# Patient Record
Sex: Male | Born: 1955 | Race: White | Hispanic: No | Smoking: Former smoker
Health system: Southern US, Community
[De-identification: ages and names within clinical notes are randomized; demographics above are authoritative.]

## PROBLEM LIST (undated history)

## (undated) DIAGNOSIS — E119 Type 2 diabetes mellitus without complications: Secondary | ICD-10-CM

## (undated) DIAGNOSIS — J189 Pneumonia, unspecified organism: Secondary | ICD-10-CM

## (undated) DIAGNOSIS — I10 Essential (primary) hypertension: Secondary | ICD-10-CM

## (undated) DIAGNOSIS — E7800 Pure hypercholesterolemia, unspecified: Secondary | ICD-10-CM

## (undated) DIAGNOSIS — G4733 Obstructive sleep apnea (adult) (pediatric): Secondary | ICD-10-CM

## (undated) DIAGNOSIS — K219 Gastro-esophageal reflux disease without esophagitis: Secondary | ICD-10-CM

## (undated) DIAGNOSIS — M359 Systemic involvement of connective tissue, unspecified: Secondary | ICD-10-CM

## (undated) DIAGNOSIS — I2510 Atherosclerotic heart disease of native coronary artery without angina pectoris: Secondary | ICD-10-CM

## (undated) HISTORY — PX: CORONARY ANGIOPLASTY WITH STENT PLACEMENT: SHX49

---

## 2009-05-25 DIAGNOSIS — J189 Pneumonia, unspecified organism: Secondary | ICD-10-CM

## 2009-05-25 HISTORY — DX: Pneumonia, unspecified organism: J18.9

## 2016-07-23 DIAGNOSIS — E119 Type 2 diabetes mellitus without complications: Secondary | ICD-10-CM

## 2016-07-23 HISTORY — DX: Type 2 diabetes mellitus without complications: E11.9

## 2016-08-23 ENCOUNTER — Encounter (HOSPITAL_COMMUNITY): Payer: Self-pay | Admitting: *Deleted

## 2016-08-23 ENCOUNTER — Inpatient Hospital Stay (HOSPITAL_COMMUNITY)
Admission: EM | Admit: 2016-08-23 | Discharge: 2016-08-26 | DRG: 167 | Disposition: A | Discharge status: Home / Self Care | Payer: 59 | Attending: Oncology | Admitting: Oncology

## 2016-08-23 ENCOUNTER — Emergency Department (HOSPITAL_COMMUNITY): Payer: 59

## 2016-08-23 DIAGNOSIS — E669 Obesity, unspecified: Secondary | ICD-10-CM

## 2016-08-23 DIAGNOSIS — R61 Generalized hyperhidrosis: Secondary | ICD-10-CM | POA: Diagnosis not present

## 2016-08-23 DIAGNOSIS — R634 Abnormal weight loss: Secondary | ICD-10-CM

## 2016-08-23 DIAGNOSIS — Z6834 Body mass index (BMI) 34.0-34.9, adult: Secondary | ICD-10-CM

## 2016-08-23 DIAGNOSIS — I10 Essential (primary) hypertension: Secondary | ICD-10-CM | POA: Diagnosis present

## 2016-08-23 DIAGNOSIS — Z803 Family history of malignant neoplasm of breast: Secondary | ICD-10-CM

## 2016-08-23 DIAGNOSIS — Z87891 Personal history of nicotine dependence: Secondary | ICD-10-CM

## 2016-08-23 DIAGNOSIS — I2510 Atherosclerotic heart disease of native coronary artery without angina pectoris: Secondary | ICD-10-CM | POA: Diagnosis not present

## 2016-08-23 DIAGNOSIS — E1165 Type 2 diabetes mellitus with hyperglycemia: Secondary | ICD-10-CM | POA: Diagnosis present

## 2016-08-23 DIAGNOSIS — Z79899 Other long term (current) drug therapy: Secondary | ICD-10-CM

## 2016-08-23 DIAGNOSIS — Z955 Presence of coronary angioplasty implant and graft: Secondary | ICD-10-CM

## 2016-08-23 DIAGNOSIS — R918 Other nonspecific abnormal finding of lung field: Secondary | ICD-10-CM | POA: Diagnosis not present

## 2016-08-23 DIAGNOSIS — Z578 Occupational exposure to other risk factors: Secondary | ICD-10-CM

## 2016-08-23 DIAGNOSIS — E871 Hypo-osmolality and hyponatremia: Secondary | ICD-10-CM | POA: Diagnosis present

## 2016-08-23 DIAGNOSIS — Z201 Contact with and (suspected) exposure to tuberculosis: Secondary | ICD-10-CM | POA: Diagnosis present

## 2016-08-23 DIAGNOSIS — E785 Hyperlipidemia, unspecified: Secondary | ICD-10-CM | POA: Diagnosis present

## 2016-08-23 DIAGNOSIS — Z7982 Long term (current) use of aspirin: Secondary | ICD-10-CM

## 2016-08-23 DIAGNOSIS — R042 Hemoptysis: Secondary | ICD-10-CM | POA: Diagnosis not present

## 2016-08-23 DIAGNOSIS — Z7984 Long term (current) use of oral hypoglycemic drugs: Secondary | ICD-10-CM

## 2016-08-23 DIAGNOSIS — G4733 Obstructive sleep apnea (adult) (pediatric): Secondary | ICD-10-CM

## 2016-08-23 DIAGNOSIS — Z7902 Long term (current) use of antithrombotics/antiplatelets: Secondary | ICD-10-CM

## 2016-08-23 DIAGNOSIS — J40 Bronchitis, not specified as acute or chronic: Secondary | ICD-10-CM | POA: Diagnosis present

## 2016-08-23 HISTORY — DX: Essential (primary) hypertension: I10

## 2016-08-23 HISTORY — DX: Systemic involvement of connective tissue, unspecified: M35.9

## 2016-08-23 HISTORY — DX: Atherosclerotic heart disease of native coronary artery without angina pectoris: I25.10

## 2016-08-23 LAB — CBC WITH DIFFERENTIAL/PLATELET
Basophils Absolute: 0 10*3/uL (ref 0.0–0.1)
Basophils Relative: 1 %
EOS ABS: 0.9 10*3/uL — AB (ref 0.0–0.7)
Eosinophils Relative: 10 %
HEMATOCRIT: 38.8 % — AB (ref 39.0–52.0)
HEMOGLOBIN: 13.2 g/dL (ref 13.0–17.0)
LYMPHS ABS: 1.8 10*3/uL (ref 0.7–4.0)
Lymphocytes Relative: 21 %
MCH: 28.5 pg (ref 26.0–34.0)
MCHC: 34 g/dL (ref 30.0–36.0)
MCV: 83.8 fL (ref 78.0–100.0)
MONOS PCT: 5 %
Monocytes Absolute: 0.4 10*3/uL (ref 0.1–1.0)
NEUTROS ABS: 5.6 10*3/uL (ref 1.7–7.7)
NEUTROS PCT: 63 %
Platelets: 281 10*3/uL (ref 150–400)
RBC: 4.63 MIL/uL (ref 4.22–5.81)
RDW: 14.6 % (ref 11.5–15.5)
WBC: 8.8 10*3/uL (ref 4.0–10.5)

## 2016-08-23 LAB — BASIC METABOLIC PANEL
Anion gap: 7 (ref 5–15)
BUN: 9 mg/dL (ref 6–20)
CHLORIDE: 100 mmol/L — AB (ref 101–111)
CO2: 26 mmol/L (ref 22–32)
Calcium: 9.7 mg/dL (ref 8.9–10.3)
Creatinine, Ser: 0.57 mg/dL — ABNORMAL LOW (ref 0.61–1.24)
GFR calc non Af Amer: >60 mL/min (ref >60)
Glucose, Bld: 261 mg/dL — ABNORMAL HIGH (ref 65–99)
POTASSIUM: 4.1 mmol/L (ref 3.5–5.1)
SODIUM: 133 mmol/L — AB (ref 135–145)

## 2016-08-23 LAB — TYPE AND SCREEN
ABO/RH(D): B POS
Antibody Screen: NEGATIVE

## 2016-08-23 LAB — ABO/RH: ABO/RH(D): B POS

## 2016-08-23 MED ORDER — IOPAMIDOL (ISOVUE-370) INJECTION 76%
80.0000 mL | Freq: Once | INTRAVENOUS | Status: AC | PRN
  Administered 2016-08-23: 80 mL via INTRAVENOUS

## 2016-08-23 MED ORDER — DEXTROSE 5 % IV SOLN: 1.0000 g | INTRAVENOUS | Status: DC

## 2016-08-23 MED ORDER — ACETAMINOPHEN 325 MG PO TABS: 650.0000 mg | ORAL_TABLET | Freq: Four times a day (QID) | ORAL | Status: DC | PRN

## 2016-08-23 MED ORDER — INSULIN ASPART 100 UNIT/ML ~~LOC~~ SOLN
0.0000 [IU] | Freq: Every day | SUBCUTANEOUS | Status: DC
  Administered 2016-08-25: 2 [IU] via SUBCUTANEOUS

## 2016-08-23 MED ORDER — DEXTROSE 5 % IV SOLN
1.0000 g | Freq: Once | INTRAVENOUS | Status: AC
  Administered 2016-08-23: 1 g via INTRAVENOUS
  Filled 2016-08-23: qty 10

## 2016-08-23 MED ORDER — LISINOPRIL 10 MG PO TABS
10.0000 mg | ORAL_TABLET | Freq: Every day | ORAL | Status: DC
  Administered 2016-08-24 – 2016-08-25 (×2): 10 mg via ORAL
  Filled 2016-08-23 (×3): qty 1

## 2016-08-23 MED ORDER — ACETAMINOPHEN 650 MG RE SUPP: 650.0000 mg | Freq: Four times a day (QID) | RECTAL | Status: DC | PRN

## 2016-08-23 MED ORDER — AZITHROMYCIN 250 MG PO TABS
250.0000 mg | ORAL_TABLET | Freq: Every day | ORAL | Status: DC
  Administered 2016-08-24: 250 mg via ORAL
  Filled 2016-08-23: qty 1

## 2016-08-23 MED ORDER — INSULIN ASPART 100 UNIT/ML ~~LOC~~ SOLN
0.0000 [IU] | Freq: Three times a day (TID) | SUBCUTANEOUS | Status: DC
  Administered 2016-08-24: 5 [IU] via SUBCUTANEOUS
  Administered 2016-08-24: 3 [IU] via SUBCUTANEOUS
  Administered 2016-08-24: 8 [IU] via SUBCUTANEOUS
  Administered 2016-08-25: 5 [IU] via SUBCUTANEOUS
  Administered 2016-08-25: 3 [IU] via SUBCUTANEOUS
  Administered 2016-08-25 – 2016-08-26 (×3): 5 [IU] via SUBCUTANEOUS

## 2016-08-23 MED ORDER — SENNOSIDES-DOCUSATE SODIUM 8.6-50 MG PO TABS
1.0000 | ORAL_TABLET | Freq: Every evening | ORAL | Status: DC | PRN
  Administered 2016-08-26: 1 via ORAL
  Filled 2016-08-23 (×2): qty 1

## 2016-08-23 MED ORDER — PRASUGREL HCL 10 MG PO TABS
10.0000 mg | ORAL_TABLET | Freq: Every day | ORAL | Status: DC
  Administered 2016-08-24: 10 mg via ORAL
  Filled 2016-08-23 (×2): qty 1

## 2016-08-23 MED ORDER — ROSUVASTATIN CALCIUM 10 MG PO TABS
10.0000 mg | ORAL_TABLET | Freq: Every day | ORAL | Status: DC
  Administered 2016-08-24 – 2016-08-25 (×2): 10 mg via ORAL
  Filled 2016-08-23 (×4): qty 1

## 2016-08-23 MED ORDER — AZITHROMYCIN 250 MG PO TABS
500.0000 mg | ORAL_TABLET | Freq: Once | ORAL | Status: AC
Start: 2016-08-23 — End: 2016-08-23
  Administered 2016-08-23: 500 mg via ORAL
  Filled 2016-08-23: qty 2

## 2016-08-23 MED ORDER — ASPIRIN 325 MG PO TABS
325.0000 mg | ORAL_TABLET | Freq: Every day | ORAL | Status: DC
  Filled 2016-08-23 (×2): qty 1

## 2016-08-23 MED ORDER — CARVEDILOL 12.5 MG PO TABS
12.5000 mg | ORAL_TABLET | Freq: Every day | ORAL | Status: DC
  Administered 2016-08-24 – 2016-08-25 (×2): 12.5 mg via ORAL
  Filled 2016-08-23 (×3): qty 1

## 2016-08-23 NOTE — Progress Notes (Signed)
Patient arrived to floor from ED at 2054. Arrived with mask on. Quantiferon tb gold blood test ordered. Paged MD about need to transfer patient to negative pressure/airborne precautions. MD agreed and will place order.

## 2016-08-23 NOTE — ED Provider Notes (Signed)
[facility] DEPT Provider Note   CSN: [id number] Arrival date & time: 08/23/16  1056     History   Chief Complaint Chief Complaint  Patient presents with  . Hemoptysis   HPI [name] [name] is a 61 y.o. male.  Patient is a 61 year old gentleman with past mental history of type 2 diabetes and coronary artery disease status post PCI 7 years, and recent angioplasty one week ago. Patient lives in [place] and has a cardiologist which he sees for coronary artery disease but has no PCP. He presents today for complaint of hemoptysis with bright red blood that occurs on coughing. Symptoms started yesterday in the setting of a three-week history of chronic cough and sinus congestion. Patient is taking Robitussin for the symptoms at home with some mild relief. Patient has an extensive smoking history but quit 4 years ago. Patient has no history of blood clots or family history of same. Patient denies any chest pain or shortness of breath. Denies any dizziness, lightheadedness, fevers, chills, abdominal pain.      Past Medical History:  Diagnosis Date  . Coronary artery disease   . Diabetes mellitus without complication ([place])   . Hypertension     There are no active problems to display for this patient.   Past Surgical History:  Procedure Laterality Date  . CARDIAC CATHETERIZATION  2011   s/p PCI  . CORONARY ANGIOPLASTY  2018       Home Medications    Prior to Admission medications   Medication Sig Start Date End Date Taking? Authorizing Provider  aspirin 325 MG tablet Take 325 mg by mouth daily.   Yes Historical Provider, MD  aspirin-sod bicarb-citric acid (ALKA-SELTZER) 325 MG TBEF tablet Take 325 mg by mouth every 6 (six) hours as needed (cold symptoms).   Yes Historical Provider, MD  carvedilol (COREG) 12.5 MG tablet Take 12.5 mg by mouth daily.   Yes Historical Provider, MD  dextromethorphan (ROBITUSSIN 12 HOUR COUGH) 30 MG/5ML liquid Take 10 mLs by mouth at bedtime as  needed for cough.   Yes Historical Provider, MD  lisinopril (PRINIVIL,ZESTRIL) 10 MG tablet Take 10 mg by mouth daily.   Yes Historical Provider, MD  metFORMIN (GLUCOPHAGE) 500 MG tablet Take 500 mg by mouth 2 (two) times daily with a meal.   Yes Historical Provider, MD  Oral Electrolytes (EMERGEN-C ELECTRO MIX) PACK Take 1 packet by mouth daily as needed (supplement).   Yes Historical Provider, MD  phenol (CHLORASEPTIC) 1.4 % LIQD Use as directed 1 spray in the mouth or throat as needed for throat irritation / pain.   Yes Historical Provider, MD  prasugrel (EFFIENT) 10 MG TABS tablet Take 10 mg by mouth daily.   Yes Historical Provider, MD  rosuvastatin (CRESTOR) 10 MG tablet Take 10 mg by mouth at bedtime.   Yes Historical Provider, MD    Family History History reviewed. No pertinent family history.  Social History Social History  Substance Use Topics  . Smoking status: Former Smoker    Types: Cigarettes    Quit date: 2014  . Smokeless tobacco: Never Used  . Alcohol use Not on file     Allergies   Patient has no known allergies.   Review of Systems Review of Systems  Constitutional: Negative for chills and fever.  HENT: Positive for rhinorrhea, sinus pressure and sore throat. Negative for ear pain.   Eyes: Negative for pain and visual disturbance.  Respiratory: Positive for cough. Negative for chest tightness and shortness  of breath.   Cardiovascular: Negative for chest pain, palpitations and leg swelling.  Gastrointestinal: Negative for abdominal pain and vomiting.  Genitourinary: Negative for dysuria and hematuria.  Musculoskeletal: Negative for arthralgias and back pain.  Skin: Negative for color change and rash.  Neurological: Negative for dizziness and syncope.  All other systems reviewed and are negative.    Physical Exam Updated Vital Signs BP 124/73   Pulse 76   Temp 98.7 F (37.1 C) (Oral)   Resp 18   SpO2 97%   Physical Exam  Constitutional: He appears  well-developed and well-nourished.  HENT:  Head: Normocephalic and atraumatic.  Eyes: Conjunctivae are normal.  Neck: Neck supple.  Cardiovascular: Normal rate and regular rhythm.   No murmur heard. Pulmonary/Chest: Effort normal and breath sounds normal. No respiratory distress. He has no wheezes. He has no rales.  Abdominal: Soft. There is no tenderness.  Musculoskeletal: He exhibits no edema.  Neurological: He is alert.  Skin: Skin is warm and dry.  Psychiatric: He has a normal mood and affect.  Nursing note and vitals reviewed.    ED Treatments / Results  Labs (all labs ordered are listed, but only abnormal results are displayed) Labs Reviewed  CBC WITH DIFFERENTIAL/PLATELET - Abnormal; Notable for the following:       Result Value   HCT 38.8 (*)    Eosinophils Absolute 0.9 (*)    All other components within normal limits  BASIC METABOLIC PANEL - Abnormal; Notable for the following:    Sodium 133 (*)    Chloride 100 (*)    Glucose, Bld 261 (*)    Creatinine, Ser 0.57 (*)    All other components within normal limits  TYPE AND SCREEN  ABO/RH    EKG  EKG Interpretation None       Radiology Dg Chest 2 View  Result Date: 08/23/2016 CLINICAL DATA:  Hemoptysis for the last 24 hours.  Ex-smoker. EXAM: CHEST  2 VIEW COMPARISON:  None. FINDINGS: Mildly enlarged cardiac silhouette. Mildly prominent pulmonary vasculature and interstitial markings. Flattening of the hemidiaphragms on the lateral view. Mild thoracic spine degenerative changes. IMPRESSION: No acute abnormality.  Mild cardiomegaly and mild changes of COPD. Electronically Signed   By: Claudie Revering M.D.   On: 08/23/2016 14:12    Procedures Procedures (including critical care time)  Medications Ordered in ED Medications - No data to display   Initial Impression / Assessment and Plan / ED Course  I have reviewed the triage vital signs and the nursing notes.  Pertinent labs & imaging results that were  available during my care of the patient were reviewed by me and considered in my medical decision making (see chart for details).  Clinical Course as of Aug 23 1525  Sun Aug 23, 2016  1228 Pt seen and evaluated. HDS in NAD. Has some bright red blood mixed with sputum in cup in the room. Reports up to 2 dixie cups of hemoptysis daily over the last 2 days. No CP. Could be antiplatelet exacerbated bronchitis w/ bleed, but Also concern for PE. Well's is Moderate risk, will get CXR, EKG, basic labs and plan CTA.  [BS]  1427 WNL Hemoglobin: 13.2 [BS]  1428 No resp distress or O2 requirement. SpO2: 97 % [BS]  1511 No active cardiopulmonary disease, mild chronic emphysematous changes and cardiomegaly. DG Chest 2 View [BS]  1526 Currently awaiting CTA.  [BS]    Clinical Course User Index [BS] Holley Raring, MD   Pt  with significant CAD and recent initiation of antiplatelet therapy presents with hemoptysis in the setting of 4wks of URI sx w/ cough and sore throat. His Well's is moderate risk, but VS are stable, Hgb wnl.  Signed out to oncoming provider.  Final Clinical Impressions(s) / ED Diagnoses   Final diagnoses:  Hemoptysis    New Prescriptions New Prescriptions   No medications on file     Holley Raring, MD 08/23/16 [place], MD 08/24/16 [place], MD 08/24/16 1444

## 2016-08-23 NOTE — H&P (Signed)
Date: 08/23/2016               Patient Name:  [name] [name] MRN: [id number]  DOB: [birth year] Age / Sex: 61 y.o., male   PCP: No Pcp Per Patient         Medical Service: Internal Medicine Teaching Service         Attending Physician: Dr. Annia Belt, MD    First Contact: Dr. Kalman Shan Pager: [phone]  Second Contact: Dr. Zada Finders Pager: [phone]       After Hours (After 5p/  First Contact Pager: [phone]  weekends / holidays): Second Contact Pager: [phone]   Chief Complaint: Hemoptysis  History of Present Illness: The patient is a 61 year old gentleman with a medical history of type 2 diabetes and coronary artery disease status post PCI 7 years ago and angioplasty approximately one week ago who presents with hemoptysis in the setting of chronic cough, pharyngitis and sinus congestion. The patient states that he has had a sore throat for the past week and has also had a chronic cough for the past 3 months which has progressively worsened. Saturday he noticed that he was coughing up dark red blood. He has had 3 such episodes over the past 48 hours. He thinks he may have coughed up approximately 4 ounces of dark red blood. Associated with this hemoptysis he endorses sore throat, hoarseness, night sweats which require him to change his pillowcase and sinus congestion. He has had these night sweats for the past 3 months. He states that sometimes they're severe and require him to change his pillowcase. He has also had a cough as mentioned for the past 3 months which is progressively worsened and is now becoming extremely bothersome for the patient. When questioned about weight loss he does endorse some weight loss over the previous 6 months but is unable to quantify. He does say that this was intentional. His most recent international travel was to [place]. 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 When specifically questioned about tuberculosis exposure the patient says that in 2002 a Nature conservation officer with his company was diagnosed with tuberculosis and he was tested and told it was negative. He does not know of any construction workers that currently have tuberculosis. He denies chest pain or shortness of breath. He denies nausea, vomiting or abdominal pain. He denies diarrhea or constipation. He denies polyuria or dysuria. He denies joint pain or ear pains. He denies illicit drug use. He has had an extensive tobacco abuse history but reports tobacco cessation approximately 4 years ago. He states he smoked one pack per day for approximately 48 years prior to quitting. He is a social drinker.  In the emergency department the patient was afebrile and hemodynamically stable. CBC was largely unremarkable. There was no leukocytosis. Basic metabolic panel shows a mild hyponatremia with sodium of 133 and hyperglycemia with a glucose of 261. A QuantiFERON-TB gold test was ordered which has not resulted. Acid-fast culture and smear of expectorated sputum was also ordered and is not resulted. Additionally, a respiratory viral panel and a pro-calcitonin were ordered. Two-view diagnostic chest radiograph showed no acute abnormality. CT angiography chest demonstrates numerous findings including numerous patchy pulmonary nodules throughout both lungs with ill-defined margins and mediastinal and right hilar lymphadenopathy. Aortic atherosclerosis  and an ectatic ascending thoracic aorta were also mentioned. The patient was given azithromycin and ceftriaxone and admitted to the internal medicine teaching service for further management and evaluation.  Meds:  Current Meds  Medication Sig  . aspirin 325 MG tablet Take 325 mg by mouth daily.  Marland Kitchen aspirin-sod bicarb-citric acid (ALKA-SELTZER) 325 MG TBEF tablet  Take 325 mg by mouth every 6 (six) hours as needed (cold symptoms).  . carvedilol (COREG) 12.5 MG tablet Take 12.5 mg by mouth daily.  Marland Kitchen dextromethorphan (ROBITUSSIN 12 HOUR COUGH) 30 MG/5ML liquid Take 10 mLs by mouth at bedtime as needed for cough.  Marland Kitchen lisinopril (PRINIVIL,ZESTRIL) 10 MG tablet Take 10 mg by mouth daily.  . metFORMIN (GLUCOPHAGE) 500 MG tablet Take 500 mg by mouth 2 (two) times daily with a meal.  . Oral Electrolytes (EMERGEN-C ELECTRO MIX) PACK Take 1 packet by mouth daily as needed (supplement).  . phenol (CHLORASEPTIC) 1.4 % LIQD Use as directed 1 spray in the mouth or throat as needed for throat irritation / pain.  . prasugrel (EFFIENT) 10 MG TABS tablet Take 10 mg by mouth daily.  . rosuvastatin (CRESTOR) 10 MG tablet Take 10 mg by mouth at bedtime.     Allergies: Allergies as of 08/23/2016  . (No Known Allergies)   Past Medical History:  Diagnosis Date  . Collagen vascular disease ([place])   . Coronary artery disease   . Diabetes mellitus without complication ([place])   . Hypertension     Family History: Breast cancer in sister   Social History: [id number] history, no illicit drug use, occasionally drinks socially  Review of Systems: A complete ROS was negative except as per HPI.   Physical Exam: Blood pressure (!) 147/64, pulse 84, temperature 98.9 F (37.2 C), resp. rate 18, SpO2 99 %. Physical Exam  Constitutional: He is oriented to person, place, and time. He appears well-developed and well-nourished.  Obese, coughing  HENT:  Head: Normocephalic and atraumatic.  Tonsillar erythema and erythema the posterior pharyngeal wall. No palpable lymphadenopathy.  Cardiovascular: Normal rate and regular rhythm.  Exam reveals no gallop and no friction rub.   No murmur heard. Heart sounds distant  Respiratory: Effort normal and breath sounds normal. No respiratory distress. He has no wheezes.  Intermittently coughing during interview and physical examination    GI: Soft. He exhibits distension. There is no tenderness.  Musculoskeletal: He exhibits edema.  Mild bilateral lower extremity edema extending superiorly approximately 6 cm above the lateral malleolus  Neurological: He is alert and oriented to person, place, and time.     EKG: no STEMI, Right BBB   CXR: No active lung process identified  Assessment & Plan by Problem: Active Problems:   Hemoptysis  Mr. Caras is a 61 year old gentleman with a medical history of hypertension, diabetes and coronary artery disease with tuberculosis risk factors who presents with worsening cough, some night sweats and hemoptysis.  # Hemoptysis ## Progressive cough As stated above, the patient presents with a three-month history of progressive cough with 3 episodes of hemoptysis occurring over the previous 48 hours. He also endorses some weight loss and night sweats. He has tuberculosis risk factors with exposure at work and travel. CT angiography shows numerous patchy pulmonary nodules throughout both lungs with mediastinal and right hilar lymphadenopathy suggestive of an infectious process. The exact etiology of the patient's hemoptysis is not defined at this time but the differential diagnosis includes tuberculosis, atypical bacterial or fungal pneumonia, upper respiratory tract infection  with a viral process, pulmonary malignancy or other process. Pulmonary embolism is not likely given negative CT angiography, normal heart rate and oxygen saturation. The patient does have tuberculosis risk factors and will be important to rule out this as a potential etiology. No leukocytosis or fever to suggest fulminant bacterial pneumonia however CT scan consistent with atypical infectious process such as fungus. Patient may also have malignant process given tobacco abuse history although I think this is less likely at this time. He was given ceftriaxone and azithromycin for pneumonia coverage in the emergency department. Also  likely is a viral pneumonia or respiratory process. -- Follow-up acid-fast culture and smear -- Follow-up QuantiFERON assay -- Follow-up pro-calcitonin -- Follow-up HIV antibody and sputum culture -- Started on azithromycin and ceftriaxone for pneumonia coverage  # Diabetes The patient was just recently diagnosed with diabetes by his cardiologist. -- Sliding scale insulin  # History of coronary artery disease ## Hypertension -- Carvedilol 12.5 mg once daily -- Lisinopril 10 mg once daily -- Prasugrel 10 mg once daily -- Rosuvastatin 10 mg once daily  FEN/GI: Normal diet DVT/PE prophylaxis: SCDs Code: Full code Dispo: Admit patient to Observation with expected length of stay less than 2 midnights.  Signed: Ophelia Shoulder, MD 08/23/2016, 9:05 PM  Pager: [phone]

## 2016-08-23 NOTE — ED Provider Notes (Signed)
CT scan does not show PE or mass however it did show : Numerous patchy pulmonary nodules throughout both lungs, most of  which demonstrate ill-defined margins with ground-glass halos,  generally centrilobular in distribution. These findings are most  suggestive of an infectious etiology such as due to bronchopneumonia  or atypical pneumonia such as fungal pneumonia   Suspect some type of infectious etiology.  Will send off sputum culture.  Start on abx.  Will consult for admission, monitoring considering the degree of gross hemoptysis.   Dorie Rank, MD 08/23/16 [phone]

## 2016-08-23 NOTE — ED Notes (Addendum)
Admitting at the bedside.  

## 2016-08-23 NOTE — ED Triage Notes (Signed)
Pt reports cough, sore throat for 2 weeks. Pt states that in the last 24 hours he has been coughing up "dark red" blood.

## 2016-08-24 DIAGNOSIS — Z7982 Long term (current) use of aspirin: Secondary | ICD-10-CM

## 2016-08-24 DIAGNOSIS — R918 Other nonspecific abnormal finding of lung field: Secondary | ICD-10-CM | POA: Diagnosis not present

## 2016-08-24 DIAGNOSIS — Z79899 Other long term (current) drug therapy: Secondary | ICD-10-CM

## 2016-08-24 DIAGNOSIS — G4733 Obstructive sleep apnea (adult) (pediatric): Secondary | ICD-10-CM

## 2016-08-24 DIAGNOSIS — R042 Hemoptysis: Secondary | ICD-10-CM | POA: Diagnosis not present

## 2016-08-24 DIAGNOSIS — I10 Essential (primary) hypertension: Secondary | ICD-10-CM | POA: Diagnosis not present

## 2016-08-24 DIAGNOSIS — Z201 Contact with and (suspected) exposure to tuberculosis: Secondary | ICD-10-CM

## 2016-08-24 DIAGNOSIS — Z87891 Personal history of nicotine dependence: Secondary | ICD-10-CM | POA: Diagnosis not present

## 2016-08-24 DIAGNOSIS — I2510 Atherosclerotic heart disease of native coronary artery without angina pectoris: Secondary | ICD-10-CM | POA: Diagnosis not present

## 2016-08-24 DIAGNOSIS — R634 Abnormal weight loss: Secondary | ICD-10-CM

## 2016-08-24 DIAGNOSIS — R61 Generalized hyperhidrosis: Secondary | ICD-10-CM

## 2016-08-24 DIAGNOSIS — Z955 Presence of coronary angioplasty implant and graft: Secondary | ICD-10-CM | POA: Diagnosis not present

## 2016-08-24 DIAGNOSIS — E119 Type 2 diabetes mellitus without complications: Secondary | ICD-10-CM | POA: Diagnosis not present

## 2016-08-24 LAB — BASIC METABOLIC PANEL
ANION GAP: 10 (ref 5–15)
BUN: 13 mg/dL (ref 6–20)
CALCIUM: 9.3 mg/dL (ref 8.9–10.3)
CO2: 23 mmol/L (ref 22–32)
Chloride: 99 mmol/L — ABNORMAL LOW (ref 101–111)
Creatinine, Ser: 0.65 mg/dL (ref 0.61–1.24)
GLUCOSE: 222 mg/dL — AB (ref 65–99)
POTASSIUM: 3.7 mmol/L (ref 3.5–5.1)
SODIUM: 132 mmol/L — AB (ref 135–145)

## 2016-08-24 LAB — RESPIRATORY PANEL BY PCR
ADENOVIRUS-RVPPCR: NOT DETECTED
Bordetella pertussis: NOT DETECTED
CORONAVIRUS 229E-RVPPCR: NOT DETECTED
CORONAVIRUS NL63-RVPPCR: NOT DETECTED
Chlamydophila pneumoniae: NOT DETECTED
Coronavirus HKU1: NOT DETECTED
Coronavirus OC43: NOT DETECTED
Influenza A: NOT DETECTED
Influenza B: NOT DETECTED
METAPNEUMOVIRUS-RVPPCR: NOT DETECTED
Mycoplasma pneumoniae: NOT DETECTED
PARAINFLUENZA VIRUS 1-RVPPCR: NOT DETECTED
PARAINFLUENZA VIRUS 2-RVPPCR: NOT DETECTED
Parainfluenza Virus 3: NOT DETECTED
Parainfluenza Virus 4: NOT DETECTED
RHINOVIRUS / ENTEROVIRUS - RVPPCR: NOT DETECTED
Respiratory Syncytial Virus: NOT DETECTED

## 2016-08-24 LAB — COMPREHENSIVE METABOLIC PANEL
ALBUMIN: 3.3 g/dL — AB (ref 3.5–5.0)
ALK PHOS: 63 U/L (ref 38–126)
ALT: 29 U/L (ref 17–63)
ANION GAP: 7 (ref 5–15)
AST: 21 U/L (ref 15–41)
BUN: 9 mg/dL (ref 6–20)
CHLORIDE: 101 mmol/L (ref 101–111)
CO2: 26 mmol/L (ref 22–32)
CREATININE: 0.62 mg/dL (ref 0.61–1.24)
Calcium: 9.7 mg/dL (ref 8.9–10.3)
GFR calc Af Amer: >60 mL/min (ref >60)
GFR calc non Af Amer: >60 mL/min (ref >60)
GLUCOSE: 227 mg/dL — AB (ref 65–99)
Potassium: 3.9 mmol/L (ref 3.5–5.1)
SODIUM: 134 mmol/L — AB (ref 135–145)
Total Bilirubin: 0.8 mg/dL (ref 0.3–1.2)
Total Protein: 7.1 g/dL (ref 6.5–8.1)

## 2016-08-24 LAB — GLUCOSE, CAPILLARY
GLUCOSE-CAPILLARY: 255 mg/dL — AB (ref 65–99)
Glucose-Capillary: 174 mg/dL — ABNORMAL HIGH (ref 65–99)
Glucose-Capillary: 219 mg/dL — ABNORMAL HIGH (ref 65–99)
Glucose-Capillary: 186 mg/dL — ABNORMAL HIGH (ref 65–99)
Glucose-Capillary: 180 mg/dL — ABNORMAL HIGH (ref 65–99)

## 2016-08-24 LAB — HIV ANTIBODY (ROUTINE TESTING W REFLEX): HIV SCREEN 4TH GENERATION: NONREACTIVE

## 2016-08-24 LAB — CBC
HCT: 35.2 % — ABNORMAL LOW (ref 39.0–52.0)
Hemoglobin: 12.2 g/dL — ABNORMAL LOW (ref 13.0–17.0)
MCH: 29 pg (ref 26.0–34.0)
MCHC: 34.7 g/dL (ref 30.0–36.0)
MCV: 83.6 fL (ref 78.0–100.0)
PLATELETS: 244 10*3/uL (ref 150–400)
RBC: 4.21 MIL/uL — ABNORMAL LOW (ref 4.22–5.81)
RDW: 14.7 % (ref 11.5–15.5)
WBC: 9.3 10*3/uL (ref 4.0–10.5)

## 2016-08-24 LAB — PROCALCITONIN

## 2016-08-24 MED ORDER — LIDOCAINE HCL 2 % EX GEL
1.0000 "application " | Freq: Once | CUTANEOUS | Status: DC
  Filled 2016-08-24: qty 5

## 2016-08-24 MED ORDER — PHENYLEPHRINE HCL 0.25 % NA SOLN: 1.0000 | Freq: Four times a day (QID) | NASAL | Status: DC | PRN

## 2016-08-24 NOTE — Progress Notes (Signed)
Received patient from 5W via wheelchair and admitted to an airborne room.  Patient AOx4, ambulatory, denies pain and VS stable.  Oriented patient to room, bed controls and call light.  Will monitor.

## 2016-08-24 NOTE — Progress Notes (Addendum)
Patient being transferred to [facility] for airbourne precautions. Gave report to [place] at this time. Vital signs are stable. Patient alert and oriented x 4. No SOB or respiratory distress noted. IV 20gauge to left upper arm patent. Saline locked at this time. Flushes well with good blood return. All skin clean dry and intact. Patient states that he is leaving tomorrow no matter what. No s/s of distress noted. Will take patient to [facility] at this time.

## 2016-08-24 NOTE — Consult Note (Signed)
Name: [name] [name] MRN: [id number] DOB: 19-Oct-1955    ADMISSION DATE:  08/23/2016 CONSULTATION DATE:  08/24/2016  REFERRING MD :  Dr. Beryle Beams   CHIEF COMPLAINT:  Hemoptysis   HISTORY OF PRESENT ILLNESS 61 year old male past smoker with PMH of 2 DM, CAD s/p PCI and angioplasty one week ago (has been on anticoagulation), and chronic cough. Presents to ED on 4/1 with chronic cough in which has worsened over the last 3 months. Patient states that Saturday he noticed he was coughing up dark red blood and then continued to have 3 more episodes in the last 48 hours. Along with the hemoptysis he was experiencing hoarseness, night sweats, and sinus congestion. Patient works in [employer] and travels frequently and is around [place] workers from [place]. States he has had intentional weight loss over the last few months, unsure of amount. PCCM asked to consult.   SIGNIFICANT EVENTS  4/1 > Presents to ED with cough/hemoptysis   STUDIES:  CXR 4/1 > Mildly enlarged cardiac silhouette, mildly prominent pulmonary vasculature and interstitial markings, flattening of the hemidiaphragms on the lateral view, mild thoracic spine degenerative changes  CTA Chest 4/1 > Numerous patchy pulmonary nodules throughout both lungs, most of which demonstrate ill-defined margins with ground-glass halos, generally centrilobular in distribution. Nonspecific mediastinal and right hilar lymphadenopathy, aortic atherosclerosis, ectatic ascending thoracic aorta, maximum diameter 4.0 cm, dilated main pulmonary artery, suggesting pulmonary arterial hypertension, mild cardiomegaly, three-vessel coronary atherosclerosis, mild emphysema with diffuse bronchial wall thickening, suggesting COPD.   MICROBIOLOGY: Acid Fast 4/1 >> Quantiferon assay 4/1 >> HIV 4/1 >> Sputum Culture 4/1 >> RVP 4/1 >>  PAST MEDICAL HISTORY :   has a past medical history of Collagen vascular disease ([place]); Coronary artery  disease; Diabetes mellitus without complication ([place]); and Hypertension.  has a past surgical history that includes Cardiac catheterization (2011) and Coronary angioplasty (2018). Prior to Admission medications   Medication Sig Start Date End Date Taking? Authorizing Provider  aspirin 325 MG tablet Take 325 mg by mouth daily.   Yes Historical Provider, MD  aspirin-sod bicarb-citric acid (ALKA-SELTZER) 325 MG TBEF tablet Take 325 mg by mouth every 6 (six) hours as needed (cold symptoms).   Yes Historical Provider, MD  carvedilol (COREG) 12.5 MG tablet Take 12.5 mg by mouth daily.   Yes Historical Provider, MD  dextromethorphan (ROBITUSSIN 12 HOUR COUGH) 30 MG/5ML liquid Take 10 mLs by mouth at bedtime as needed for cough.   Yes Historical Provider, MD  lisinopril (PRINIVIL,ZESTRIL) 10 MG tablet Take 10 mg by mouth daily.   Yes Historical Provider, MD  metFORMIN (GLUCOPHAGE) 500 MG tablet Take 500 mg by mouth 2 (two) times daily with a meal.   Yes Historical Provider, MD  Oral Electrolytes (EMERGEN-C ELECTRO MIX) PACK Take 1 packet by mouth daily as needed (supplement).   Yes Historical Provider, MD  phenol (CHLORASEPTIC) 1.4 % LIQD Use as directed 1 spray in the mouth or throat as needed for throat irritation / pain.   Yes Historical Provider, MD  prasugrel (EFFIENT) 10 MG TABS tablet Take 10 mg by mouth daily.   Yes Historical Provider, MD  rosuvastatin (CRESTOR) 10 MG tablet Take 10 mg by mouth at bedtime.   Yes Historical Provider, MD   No Known Allergies  FAMILY HISTORY:  family history is not on file. SOCIAL HISTORY:  reports that he quit smoking about 4 years ago. His smoking use included Cigarettes. He has never used smokeless tobacco.  REVIEW OF SYSTEMS:   All negative; except for those that are bolded, which indicate positives.  Constitutional: weight loss, weight gain, night sweats, fevers, chills, fatigue, weakness.  HEENT: headaches, sore throat, sneezing, nasal congestion, post  nasal drip, difficulty swallowing, tooth/dental problems, visual complaints, visual changes, ear aches. Neuro: difficulty with speech, weakness, numbness, ataxia. CV:  chest pain, orthopnea, PND, swelling in lower extremities, dizziness, palpitations, syncope.  Resp: cough, hemoptysis, dyspnea, wheezing. GI: heartburn, indigestion, abdominal pain, nausea, vomiting, diarrhea, constipation, change in bowel habits, loss of appetite, hematemesis, melena, hematochezia.  GU: dysuria, change in color of urine, urgency or frequency, flank pain, hematuria. MSK: joint pain or swelling, decreased range of motion. Psych: change in mood or affect, depression, anxiety, suicidal ideations, homicidal ideations. Skin: rash, itching, bruising.  SUBJECTIVE:  Has had about 40 cc of bright red hemoptysis this am. Denies dyspnea. States chronic cough and sore thorat continues.   VITAL SIGNS: Temp:  [97.7 F (36.5 C)-98.9 F (37.2 C)] 98.2 F (36.8 C) (04/02 0444) Pulse Rate:  [73-85] 78 (04/02 0444) Resp:  [16-18] 18 (04/02 0444) BP: (115-147)/(64-79) 115/68 (04/02 0444) SpO2:  [94 %-99 %] 94 % (04/02 0444) Weight:  [109.4 kg (241 lb 2.9 oz)] 109.4 kg (241 lb 2.9 oz) (04/01 2321)  PHYSICAL EXAMINATION: General:  Adult male, no distress  Neuro:  Alert, oriented, grossly intact HEENT:  Normocephalic  Cardiovascular:  RRR, no MRG, NI S1/S2 Lungs:  Coarse breath sounds, no wheezes/crackles, non-labored  Abdomen:  Obese, non-tender, active bowel sounds  Musculoskeletal:  No acute  Skin:  Warm, dry, intact    Recent Labs Lab 08/23/16 1345 08/24/16 0054  NA 133* 132*  K 4.1 3.7  CL 100* 99*  CO2 26 23  BUN 9 13  CREATININE 0.57* 0.65  GLUCOSE 261* 222*    Recent Labs Lab 08/23/16 1345 08/24/16 0054  HGB 13.2 12.2*  HCT 38.8* 35.2*  WBC 8.8 9.3  PLT 281 244   Dg Chest 2 View  Result Date: 08/23/2016 CLINICAL DATA:  Hemoptysis for the last 24 hours.  Ex-smoker. EXAM: CHEST  2 VIEW  COMPARISON:  None. FINDINGS: Mildly enlarged cardiac silhouette. Mildly prominent pulmonary vasculature and interstitial markings. Flattening of the hemidiaphragms on the lateral view. Mild thoracic spine degenerative changes. IMPRESSION: No acute abnormality.  Mild cardiomegaly and mild changes of COPD. Electronically Signed   By: Claudie Revering M.D.   On: 08/23/2016 14:12   Ct Angio Chest Pe W/cm &/or Wo Cm  Result Date: 08/23/2016 CLINICAL DATA:  Hemoptysis. EXAM: CT ANGIOGRAPHY CHEST WITH CONTRAST TECHNIQUE: Multidetector CT imaging of the chest was performed using the standard protocol during bolus administration of intravenous contrast. Multiplanar CT image reconstructions and MIPs were obtained to evaluate the vascular anatomy. CONTRAST:  80 cc Isovue 370 IV. COMPARISON:  Chest radiograph from earlier today. FINDINGS: Motion degraded scan. Cardiovascular: The study is moderate to high quality for the evaluation of pulmonary embolism, mildly limited by motion degradation. There are no filling defects in the central, lobar, segmental or subsegmental pulmonary artery branches to suggest acute pulmonary embolism. Atherosclerotic thoracic aorta. Ectatic ascending thoracic aorta, maximum diameter 4.0 cm. Dilated main pulmonary artery (3.5 cm diameter). Mild cardiomegaly. No significant pericardial fluid/thickening. Left anterior descending, left circumflex and right coronary atherosclerosis. Mediastinum/Nodes: Bilateral calcified thyroid nodules, largest 1.4 cm on the left. Unremarkable esophagus. No axillary adenopathy. Mildly enlarged 1.3 cm right paratracheal node (series 5/ image 32). Mildly enlarged 1.2 cm right subcarinal node (series 5/image  50). Mildly enlarged 1.0 cm AP window node (series 5/ image 37). Mildly enlarged 1.3 cm right hilar node (series 5/ image 39). No additional pathologically enlarged mediastinal or hilar lymph nodes. Lungs/Pleura: No pneumothorax. No pleural effusion. Mild centrilobular  emphysema with diffuse bronchial wall thickening. There are numerous patchy pulmonary nodules throughout both lungs, most of which appear to demonstrate ill-defined margins with ground-glass halos, and which generally appear centrilobular in distribution, the largest 1.7 cm in the right middle lobe (series 7/ image 53), with additional representative nodules measuring 0.9 cm in the peripheral right lower lobe (series 7/ image 69) and 0.9 cm in the lingula (series 7/ image 49). Upper abdomen: Probable tiny hiatal hernia. Musculoskeletal: No aggressive appearing focal osseous lesions. Moderate thoracic spondylosis. Review of the MIP images confirms the above findings. IMPRESSION: 1. No evidence of pulmonary embolism on this motion degraded scan. 2. Numerous patchy pulmonary nodules throughout both lungs, most of which demonstrate ill-defined margins with ground-glass halos, generally centrilobular in distribution. These findings are most suggestive of an infectious etiology such as due to bronchopneumonia or atypical pneumonia such as fungal pneumonia. Pulmonary metastatic disease is considered less likely but not entirely excluded and a follow-up post treatment chest CT is recommended. 3. Nonspecific mediastinal and right hilar lymphadenopathy, which should also be followed up on a posttreatment chest CT with IV contrast in 2-3 months. 4. Aortic atherosclerosis. Ectatic ascending thoracic aorta, maximum diameter 4.0 cm. Recommend annual imaging followup by CTA or MRA. This recommendation follows 2010 ACCF/AHA/AATS/ACR/ASA/SCA/SCAI/SIR/STS/SVM Guidelines for the Diagnosis and Management of Patients with Thoracic Aortic Disease. Circulation. 2010; 121: Y099-I338. 5. Dilated main pulmonary artery, suggesting pulmonary arterial hypertension. 6. Mild cardiomegaly . 7. Three-vessel coronary atherosclerosis. 8. Mild emphysema with diffuse bronchial wall thickening, suggesting COPD. Electronically Signed   By: Ilona Sorrel  M.D.   On: 08/23/2016 17:18    ASSESSMENT / PLAN:  Hemoptysis in setting of malignancy vs tuberculosis vs atypical fungal/viral infection with recent anticoagulation  H/O Tobacco Abuse Patient has had a Chronic cough for the last 3 months that has now progressed on Saturday to hemoptysis. Quiet smoking four+ years ago. Travels frequently. Has internationally traveled. Works with [employer]. Afebrile. No Leokocytosis. Procal Negative.   -High likelihood TB given history and presenting symptoms  -Patient states that he has to leave today to go to [place] for work  Plan -Maintain Oxygen >92 -Follow Culture Data  -Follow Acid-Fast Data and Quantiferon assay  -Send ANCA Titers  -Maintain Hemoglobin >7 -Will discuss with Pulmonologist Timing/Need of Bronchoscopy for sampling and visualization  Hayden Pedro, AG-ACNP [facility]  Pgr: [phone]  PCCM Pgr: [phone]  Attending Note:  I have examined patient, reviewed labs, studies and notes. I have discussed the case with Beaulah Corin, and I agree with the data and plans as amended above.   61 year old former smoker with a history of diabetes, coronary disease. He is just 1 week status post PCI and angioplasty. He was started on Effient following the procedure. He has had 3-4 months of progressive cough. Typically productive of scant sputum. 3 days ago he noticed that he began to cough up dark red blood, followed by bright red blood mixed with his sputum. He also had some night sweats, sinus congestion. He seen a small amount of blood from his nose. He presented for evaluation 2 days ago. A CT scan of his chest was performed that did not show any evidence of pulmonary embolism but which  does show scattered loose nodular infiltrates most prominent in the right lower lobe but seen bilaterally. There is no evident endobronchial lesion by CT scan. On my evaluation he is a pleasant well-appearing obese man in no  distress on room air. He is able to carry on a conversation without coughing. No stridor area and good breath sounds bilaterally with a few basilar inspiratory crackles. No wheezing. Heart is regular without a murmur. Trace pretibial edema bilaterally. No lower extremity asymmetry. He is a Engineer, production business, travels frequently from [place] to [place] to [place] to [place]. He works around [place] workers from [place] countries but has no history of TB or any known direct exposure to TB. I agree with evaluation thus far-he needs AFBs. His QuantiFERON has been obtained and is pending. He would like to leave the hospitalization is possible but I have advised him that he needs to undergo full workup of his hemoptysis. In particular there may be Health Department concerns regarding a hospital before his AFB is determined. I reviewed the differential diagnosis with him. He understands that TB is on the differential, also explained that this could be malignancy, could be a pulmonary vasculitis. I believe he needs bronchoscopy and we will arrange for this tomorrow morning. In the meantime we will wait for his TB evaluation, send ANCA's. Hold anticoagulation for now - he states that he did not receive a stent, just PCI.    Baltazar Apo, MD, PhD 08/24/2016, 1:36 PM [facility] [phone] or if no answer [phone]

## 2016-08-24 NOTE — Progress Notes (Signed)
1200 Pt coughs out bright red sputum, sent to lab for AFB.

## 2016-08-24 NOTE — Progress Notes (Signed)
   Subjective: Patient was evaluated this morning on rounds. He said he had another episode of hemoptysis this morning after coughing. He states that the amount of blood in his sputum has decreased since he first noticed 3 days ago. He denies any chest pain or shortness of breath or fever/chills. He states he has a sore throat but no other neck pain.  Objective:  Vital signs in last 24 hours: Vitals:   08/23/16 1849 08/23/16 2054 08/23/16 2321 08/24/16 0444  BP: 138/72 (!) 147/64 (!) 146/79 115/68  Pulse: 73 84 85 78  Resp: 16 18 17 18   Temp:  98.9 F (37.2 C) 97.7 F (36.5 C) 98.2 F (36.8 C)  TempSrc:   Oral Oral  SpO2: 99% 99% 97% 94%  Weight:   241 lb 2.9 oz (109.4 kg)   Height:   5\' 10"  (1.778 m)    Physical Exam  Constitutional: He is well-developed, well-nourished, and in no distress.  HENT:  Mouth/Throat: Oropharynx is clear and moist.  Blood streak on tounge  Neck: Normal range of motion. Neck supple.  Cardiovascular: Normal rate, regular rhythm and normal heart sounds.  Exam reveals no gallop and no friction rub.   No murmur heard. Pulmonary/Chest: Breath sounds normal. No respiratory distress. He has no wheezes. He has no rales.  Musculoskeletal: He exhibits no edema.  Skin: Skin is warm and dry.     Assessment/Plan:  Active Problems:   Hemoptysis  Hemoptysis with progressive cough Patient reports a loss of 2 ounces of blood this morning after coughing.  Patient states the amount of blood in his sputum has decreased since he first noticed it a couple of days ago.  He has tuberculosis risk factors and 3 sputum cultures QH8 are being collected currently.  His CTangiography showed numerous patchy pulmonary nodules throughout both lungs with mediastinal and right hilar lymphadenopathy. Pulmonary critical care was consulted for further input.  These findings may be from an infectious cause and patient will continue azithromycin to cover for atypical bacteria.  Patient  has a history of heavy smoking and with pulmonary nodules on imaging, hemoptysis could be malignancy related although less likely.   -Acid-fast culture and smear 3  -Follow-up quantiFERON assay  -Continue azithromycin  -PCCM consult -Consider ID consult - Airborne precautions  Diabetes type II Recently diagnosed by his cardiologist - SSI  Hypertension Continuing home blood pressure medications -Carvedilol 12.5 mg once daily -Lisinopril 10 mg once daily  History of coronary artery disease status post PCI 7 years ago and angioplasty approximately one week ago  -Rosuvastatin 10 mg once daily -Prasugrel 10 mg once daily - aspirin 325mg   FEN/GI: Normal diet DVT/PE prophylaxis: SCDs Code: Full code  Dispo: Anticipated discharge pending consult recommendations    Valinda Party, DO 08/24/2016, 11:40 AM Pager: [phone]

## 2016-08-24 NOTE — Consult Note (Addendum)
Date of Admission:  08/23/2016  Date of Consult:  08/24/2016  Reason for Consult: Lung nodules with concern for TB Referring Physician: Dr. Beryle Beams   HPI: [name] [name] is an 61 y.o. male with past history significant for coronary artery disease, untreated obstructive sleep apnea, morbid obesity, 50-pack-year history of smoking having quit 4 years prior who presented to the emergency department with blood-tinged sputum over the last 72 hours. He had developed a cough a proximally 3 months ago that worsened in the last week after he developed a sore throat. Within 3 days of admission again he began to cough blood tinged sputum in particular after eating. He has a one half year history of 40 pounds of weight loss though this has been intentional through restricting simple carbohydrates. He also relays a several month history of having severe sweating of his face that makes his pillow wet in the morning. This sweating does not awaken him from sleep and has happened when he was younger he denies for body sweats that require him to change his clothes or his sheets.  Initial chest x-ray showed some cardiomegaly and hyperinflation of the lungs but no acute findings. Since currently a CT scan showed nodules throughout the lungs with some patchy groundglass appearance around some of them. There is no overt cavitation seen on these films. The patient has been on ceftriaxone and azithromycin for community acquired pneumonia.  He does have a history of having been in contact with in patient with known pulmonary tuberculosis through his work. 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 He has been to [place] including being [place] recently for vacation. When exposed to tuberculosis several years ago he had a negative TB skin test. He has expectorated sputum to be sent for bacterial cultures and also AFB stain and culture. He has  been seen by pulmonary who are planning a bronchoscopy with BAL tomorrow. Initially there was concern that the patient would leave the hospital to try to get to a conference in [place] and then a subsequent job. He has however changed his travel plans so that his workup can be completed and he tells me very clearly that he does not want up with anyone else at risk of contracting tuberculosis.   Past Medical History:  Diagnosis Date  . Collagen vascular disease ([place])   . Coronary artery disease   . Diabetes mellitus without complication ([place])   . Hypertension     Past Surgical History:  Procedure Laterality Date  . CARDIAC CATHETERIZATION  2011   s/p PCI  . CORONARY ANGIOPLASTY  2018    Social History:  reports that he quit smoking about 4 years ago. His smoking use included Cigarettes. He has never used smokeless tobacco. His alcohol and drug histories are not on file.   History reviewed. No pertinent family history.  No Known Allergies   Medications: I have reviewed patients current medications as documented in Epic Anti-infectives    Start     Dose/Rate Route Frequency Ordered Stop   08/24/16 1800  cefTRIAXone (ROCEPHIN) 1 g in dextrose 5 % 50 mL IVPB  Status:  Discontinued     1 g 100 mL/hr over 30 Minutes Intravenous Every 24 hours 08/23/16 2138 08/24/16 0656   08/24/16 1000  azithromycin (ZITHROMAX) tablet 250 mg  Status:  Discontinued     250 mg Oral Daily 08/23/16 2138 08/24/16 1520  08/23/16 1815  cefTRIAXone (ROCEPHIN) 1 g in dextrose 5 % 50 mL IVPB     1 g 100 mL/hr over 30 Minutes Intravenous  Once 08/23/16 1808 08/23/16 1901   08/23/16 1815  azithromycin (ZITHROMAX) tablet 500 mg     500 mg Oral  Once 08/23/16 1808 08/23/16 1829         ROS:as in HPI otherwise remainder of 12 point Review of Systems is negative  Blood pressure (!) 148/83, pulse 80, temperature 98.1 F (36.7 C), temperature source Oral, resp. rate 18, height _0  (1.778 m), weight 241 lb  2.9 oz (109.4 kg), SpO2 97 %. General: Alert and awake, oriented x3, not in any acute distress. HEENT: anicteric sclera,  EOMI, oropharynx clear and without exudate Cardiovascular: egular rate, normal r,  no murmur rubs or gallops Pulmonary: clear to auscultation bilaterally, no wheezing, rales or rhonchi Gastrointestinal: soft nontender, nondistended, normal bowel sounds, Musculoskeletal: no  clubbing or edema noted bilaterally Skin, soft tissue: no rashes Neuro: nonfocal, strength and sensation intact   Results for orders placed or performed during the hospital encounter of 08/23/16 (from the past 48 hour(s))  CBC with Differential     Status: Abnormal   Collection Time: 08/23/16  1:45 PM  Result Value Ref Range   WBC 8.8 4.0 - 10.5 K/uL   RBC 4.63 4.22 - 5.81 MIL/uL   Hemoglobin 13.2 13.0 - 17.0 g/dL   HCT 38.8 (L) 39.0 - 52.0 %   MCV 83.8 78.0 - 100.0 fL   MCH 28.5 26.0 - 34.0 pg   MCHC 34.0 30.0 - 36.0 g/dL   RDW 14.6 11.5 - 15.5 %   Platelets 281 150 - 400 K/uL   Neutrophils Relative % 63 %   Neutro Abs 5.6 1.7 - 7.7 K/uL   Lymphocytes Relative 21 %   Lymphs Abs 1.8 0.7 - 4.0 K/uL   Monocytes Relative 5 %   Monocytes Absolute 0.4 0.1 - 1.0 K/uL   Eosinophils Relative 10 %   Eosinophils Absolute 0.9 (H) 0.0 - 0.7 K/uL   Basophils Relative 1 %   Basophils Absolute 0.0 0.0 - 0.1 K/uL  Basic metabolic panel     Status: Abnormal   Collection Time: 08/23/16  1:45 PM  Result Value Ref Range   Sodium 133 (L) 135 - 145 mmol/L   Potassium 4.1 3.5 - 5.1 mmol/L   Chloride 100 (L) 101 - 111 mmol/L   CO2 26 22 - 32 mmol/L   Glucose, Bld 261 (H) 65 - 99 mg/dL   BUN 9 6 - 20 mg/dL   Creatinine, Ser 0.57 (L) 0.61 - 1.24 mg/dL   Calcium 9.7 8.9 - 10.3 mg/dL   GFR calc non Af Amer >60 >60 mL/min   GFR calc Af Amer >60 >60 mL/min    Comment: (NOTE) The eGFR has been calculated using the CKD EPI equation. This calculation has not been validated in all clinical situations. eGFR's  persistently <60 mL/min signify possible Chronic Kidney Disease.    Anion gap 7 5 - 15  Type and screen [place]     Status: None   Collection Time: 08/23/16  1:45 PM  Result Value Ref Range   ABO/RH(D) B POS    Antibody Screen NEG    Sample Expiration 08/26/2016   ABO/Rh     Status: None   Collection Time: 08/23/16  1:45 PM  Result Value Ref Range   ABO/RH(D) B POS   Respiratory  Panel by PCR     Status: None   Collection Time: 08/23/16  7:49 PM  Result Value Ref Range   Adenovirus NOT DETECTED NOT DETECTED   Coronavirus 229E NOT DETECTED NOT DETECTED   Coronavirus HKU1 NOT DETECTED NOT DETECTED   Coronavirus NL63 NOT DETECTED NOT DETECTED   Coronavirus OC43 NOT DETECTED NOT DETECTED   Metapneumovirus NOT DETECTED NOT DETECTED   Rhinovirus / Enterovirus NOT DETECTED NOT DETECTED   Influenza A NOT DETECTED NOT DETECTED   Influenza B NOT DETECTED NOT DETECTED   Parainfluenza Virus 1 NOT DETECTED NOT DETECTED   Parainfluenza Virus 2 NOT DETECTED NOT DETECTED   Parainfluenza Virus 3 NOT DETECTED NOT DETECTED   Parainfluenza Virus 4 NOT DETECTED NOT DETECTED   Respiratory Syncytial Virus NOT DETECTED NOT DETECTED   Bordetella pertussis NOT DETECTED NOT DETECTED   Chlamydophila pneumoniae NOT DETECTED NOT DETECTED   Mycoplasma pneumoniae NOT DETECTED NOT DETECTED  Glucose, capillary     Status: Abnormal   Collection Time: 08/23/16  8:57 PM  Result Value Ref Range   Glucose-Capillary 174 (H) 65 - 99 mg/dL   Comment 1 Notify RN    Comment 2 Document in Chart   Culture, respiratory (NON-Expectorated)     Status: None (Preliminary result)   Collection Time: 08/23/16  9:41 PM  Result Value Ref Range   Specimen Description EXPECTORATED SPUTUM    Special Requests NONE    Gram Stain      MODERATE WBC PRESENT, PREDOMINANTLY PMN MODERATE GRAM NEGATIVE RODS MODERATE GRAM POSITIVE COCCI IN PAIRS FEW GRAM POSITIVE COCCI IN CLUSTERS FEW GRAM POSITIVE RODS RARE  YEAST    Culture CULTURE REINCUBATED FOR BETTER GROWTH    Report Status PENDING   HIV antibody (Routine Testing)     Status: None   Collection Time: 08/24/16 12:54 AM  Result Value Ref Range   HIV Screen 4th Generation wRfx Non Reactive Non Reactive    Comment: (NOTE) Performed At: [facility] [place], [place] [id number] Lindon Romp MD [id number]   Basic metabolic panel     Status: Abnormal   Collection Time: 08/24/16 12:54 AM  Result Value Ref Range   Sodium 132 (L) 135 - 145 mmol/L   Potassium 3.7 3.5 - 5.1 mmol/L   Chloride 99 (L) 101 - 111 mmol/L   CO2 23 22 - 32 mmol/L   Glucose, Bld 222 (H) 65 - 99 mg/dL   BUN 13 6 - 20 mg/dL   Creatinine, Ser 0.65 0.61 - 1.24 mg/dL   Calcium 9.3 8.9 - 10.3 mg/dL   GFR calc non Af Amer >60 >60 mL/min   GFR calc Af Amer >60 >60 mL/min    Comment: (NOTE) The eGFR has been calculated using the CKD EPI equation. This calculation has not been validated in all clinical situations. eGFR's persistently <60 mL/min signify possible Chronic Kidney Disease.    Anion gap 10 5 - 15  CBC     Status: Abnormal   Collection Time: 08/24/16 12:54 AM  Result Value Ref Range   WBC 9.3 4.0 - 10.5 K/uL   RBC 4.21 (L) 4.22 - 5.81 MIL/uL   Hemoglobin 12.2 (L) 13.0 - 17.0 g/dL   HCT 35.2 (L) 39.0 - 52.0 %   MCV 83.6 78.0 - 100.0 fL   MCH 29.0 26.0 - 34.0 pg   MCHC 34.7 30.0 - 36.0 g/dL   RDW 14.7 11.5 - 15.5 %   Platelets 244 150 -  400 K/uL  Procalcitonin     Status: None   Collection Time: 08/24/16 12:54 AM  Result Value Ref Range   Procalcitonin <0.10 ng/mL    Comment:        Interpretation: PCT (Procalcitonin) <= 0.5 ng/mL: Systemic infection (sepsis) is not likely. Local bacterial infection is possible. (NOTE)         ICU PCT Algorithm               Non ICU PCT Algorithm    ----------------------------     ------------------------------         PCT < 0.25 ng/mL                 PCT < 0.1 ng/mL     Stopping of  antibiotics            Stopping of antibiotics       strongly encouraged.               strongly encouraged.    ----------------------------     ------------------------------       PCT level decrease by               PCT < 0.25 ng/mL       >= 80% from peak PCT       OR PCT 0.25 - 0.5 ng/mL          Stopping of antibiotics                                             encouraged.     Stopping of antibiotics           encouraged.    ----------------------------     ------------------------------       PCT level decrease by              PCT >= 0.25 ng/mL       < 80% from peak PCT        AND PCT >= 0.5 ng/mL            Continuin g antibiotics                                              encouraged.       Continuing antibiotics            encouraged.    ----------------------------     ------------------------------     PCT level increase compared          PCT > 0.5 ng/mL         with peak PCT AND          PCT >= 0.5 ng/mL             Escalation of antibiotics                                          strongly encouraged.      Escalation of antibiotics        strongly encouraged.   Glucose, capillary     Status: Abnormal   Collection Time: 08/24/16  8:13 AM  Result Value Ref Range  Glucose-Capillary 255 (H) 65 - 99 mg/dL  Comprehensive metabolic panel     Status: Abnormal   Collection Time: 08/24/16 12:16 PM  Result Value Ref Range   Sodium 134 (L) 135 - 145 mmol/L   Potassium 3.9 3.5 - 5.1 mmol/L   Chloride 101 101 - 111 mmol/L   CO2 26 22 - 32 mmol/L   Glucose, Bld 227 (H) 65 - 99 mg/dL   BUN 9 6 - 20 mg/dL   Creatinine, Ser 0.62 0.61 - 1.24 mg/dL   Calcium 9.7 8.9 - 10.3 mg/dL   Total Protein 7.1 6.5 - 8.1 g/dL   Albumin 3.3 (L) 3.5 - 5.0 g/dL   AST 21 15 - 41 U/L   ALT 29 17 - 63 U/L   Alkaline Phosphatase 63 38 - 126 U/L   Total Bilirubin 0.8 0.3 - 1.2 mg/dL   GFR calc non Af Amer >60 >60 mL/min   GFR calc Af Amer >60 >60 mL/min    Comment: (NOTE) The eGFR has been  calculated using the CKD EPI equation. This calculation has not been validated in all clinical situations. eGFR's persistently <60 mL/min signify possible Chronic Kidney Disease.    Anion gap 7 5 - 15  Glucose, capillary     Status: Abnormal   Collection Time: 08/24/16  1:01 PM  Result Value Ref Range   Glucose-Capillary 219 (H) 65 - 99 mg/dL   Comment 1 Notify RN   Glucose, capillary     Status: Abnormal   Collection Time: 08/24/16  4:57 PM  Result Value Ref Range   Glucose-Capillary 186 (H) 65 - 99 mg/dL   _0 (sdes,specrequest,cult,reptstatus)   ) Recent Results (from the past 720 hour(s))  Respiratory Panel by PCR     Status: None   Collection Time: 08/23/16  7:49 PM  Result Value Ref Range Status   Adenovirus NOT DETECTED NOT DETECTED Final   Coronavirus 229E NOT DETECTED NOT DETECTED Final   Coronavirus HKU1 NOT DETECTED NOT DETECTED Final   Coronavirus NL63 NOT DETECTED NOT DETECTED Final   Coronavirus OC43 NOT DETECTED NOT DETECTED Final   Metapneumovirus NOT DETECTED NOT DETECTED Final   Rhinovirus / Enterovirus NOT DETECTED NOT DETECTED Final   Influenza A NOT DETECTED NOT DETECTED Final   Influenza B NOT DETECTED NOT DETECTED Final   Parainfluenza Virus 1 NOT DETECTED NOT DETECTED Final   Parainfluenza Virus 2 NOT DETECTED NOT DETECTED Final   Parainfluenza Virus 3 NOT DETECTED NOT DETECTED Final   Parainfluenza Virus 4 NOT DETECTED NOT DETECTED Final   Respiratory Syncytial Virus NOT DETECTED NOT DETECTED Final   Bordetella pertussis NOT DETECTED NOT DETECTED Final   Chlamydophila pneumoniae NOT DETECTED NOT DETECTED Final   Mycoplasma pneumoniae NOT DETECTED NOT DETECTED Final  Culture, respiratory (NON-Expectorated)     Status: None (Preliminary result)   Collection Time: 08/23/16  9:41 PM  Result Value Ref Range Status   Specimen Description EXPECTORATED SPUTUM  Final   Special Requests NONE  Final   Gram Stain   Final    MODERATE WBC PRESENT,  PREDOMINANTLY PMN MODERATE GRAM NEGATIVE RODS MODERATE GRAM POSITIVE COCCI IN PAIRS FEW GRAM POSITIVE COCCI IN CLUSTERS FEW GRAM POSITIVE RODS RARE YEAST    Culture CULTURE REINCUBATED FOR BETTER GROWTH  Final   Report Status PENDING  Incomplete     Impression/Recommendation  Active Problems:   Hemoptysis   [name] [name] is a 61 y.o. 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 He has a 40 pound weight loss in his history as well though this was intentional through carbohydrate restriction. He has been found to have nodules on CT scan of the lungs.  #1 Lung nodules with concern for pulmonary TB: I am encouaraged that he does NOT have cavitary pathology on imaging. He does certainly have multiple etiological risk factors for TB and the hemoptysis and weight loss if it was not intentional would be  concerning.  I agree with bronchoscopy with BAL for AFB, fungal and bacterial pathogens  DC azithromycin since it has activity against TB and could potentially compromise the sensitivity of the sputa  He needs THREE specimens including the BAL to be negative for AFB stain  Followup QF gold  OK to continue the ceftriaxone  After we have the above data will be able to make more meaningful rational decision  He has rearranged his travel plans to accommodate workup understands that if we diagnosed him with TB via laboratory testing or clinical suspicion that this will require further delay in his return to work.  We can send serum crypto ag, urine histoplasma antigen and serum cocci antibodies.    08/24/2016, 6:47 PM   Thank you so much for this interesting consult  [place] [facility] [place] [phone] (pager) [phone] (office) 08/24/2016, 6:47 PM  Rhina Brack[place] 08/24/2016, 6:47 PM

## 2016-08-25 ENCOUNTER — Encounter (HOSPITAL_COMMUNITY): Payer: 59

## 2016-08-25 ENCOUNTER — Observation Stay (HOSPITAL_COMMUNITY): Payer: 59

## 2016-08-25 ENCOUNTER — Encounter (HOSPITAL_COMMUNITY)
Admission: EM | Disposition: A | Discharge status: Home / Self Care | Payer: Self-pay | Source: Home / Self Care | Attending: Oncology

## 2016-08-25 DIAGNOSIS — Z201 Contact with and (suspected) exposure to tuberculosis: Secondary | ICD-10-CM | POA: Diagnosis present

## 2016-08-25 DIAGNOSIS — G4733 Obstructive sleep apnea (adult) (pediatric): Secondary | ICD-10-CM | POA: Diagnosis present

## 2016-08-25 DIAGNOSIS — Z7902 Long term (current) use of antithrombotics/antiplatelets: Secondary | ICD-10-CM | POA: Diagnosis not present

## 2016-08-25 DIAGNOSIS — Z803 Family history of malignant neoplasm of breast: Secondary | ICD-10-CM | POA: Diagnosis not present

## 2016-08-25 DIAGNOSIS — I10 Essential (primary) hypertension: Secondary | ICD-10-CM | POA: Diagnosis present

## 2016-08-25 DIAGNOSIS — R0609 Other forms of dyspnea: Secondary | ICD-10-CM | POA: Diagnosis not present

## 2016-08-25 DIAGNOSIS — E871 Hypo-osmolality and hyponatremia: Secondary | ICD-10-CM | POA: Diagnosis present

## 2016-08-25 DIAGNOSIS — R918 Other nonspecific abnormal finding of lung field: Secondary | ICD-10-CM | POA: Diagnosis present

## 2016-08-25 DIAGNOSIS — E785 Hyperlipidemia, unspecified: Secondary | ICD-10-CM | POA: Diagnosis present

## 2016-08-25 DIAGNOSIS — R042 Hemoptysis: Secondary | ICD-10-CM | POA: Diagnosis present

## 2016-08-25 DIAGNOSIS — Z79899 Other long term (current) drug therapy: Secondary | ICD-10-CM | POA: Diagnosis not present

## 2016-08-25 DIAGNOSIS — Z7982 Long term (current) use of aspirin: Secondary | ICD-10-CM | POA: Diagnosis not present

## 2016-08-25 DIAGNOSIS — Z955 Presence of coronary angioplasty implant and graft: Secondary | ICD-10-CM | POA: Diagnosis not present

## 2016-08-25 DIAGNOSIS — E119 Type 2 diabetes mellitus without complications: Secondary | ICD-10-CM | POA: Diagnosis not present

## 2016-08-25 DIAGNOSIS — Z6834 Body mass index (BMI) 34.0-34.9, adult: Secondary | ICD-10-CM | POA: Diagnosis not present

## 2016-08-25 DIAGNOSIS — Z87891 Personal history of nicotine dependence: Secondary | ICD-10-CM | POA: Diagnosis not present

## 2016-08-25 DIAGNOSIS — J40 Bronchitis, not specified as acute or chronic: Secondary | ICD-10-CM | POA: Diagnosis present

## 2016-08-25 DIAGNOSIS — R61 Generalized hyperhidrosis: Secondary | ICD-10-CM | POA: Diagnosis present

## 2016-08-25 DIAGNOSIS — E1165 Type 2 diabetes mellitus with hyperglycemia: Secondary | ICD-10-CM | POA: Diagnosis present

## 2016-08-25 DIAGNOSIS — I2510 Atherosclerotic heart disease of native coronary artery without angina pectoris: Secondary | ICD-10-CM | POA: Diagnosis present

## 2016-08-25 DIAGNOSIS — R634 Abnormal weight loss: Secondary | ICD-10-CM | POA: Diagnosis not present

## 2016-08-25 DIAGNOSIS — Z7984 Long term (current) use of oral hypoglycemic drugs: Secondary | ICD-10-CM | POA: Diagnosis not present

## 2016-08-25 HISTORY — PX: VIDEO BRONCHOSCOPY: SHX5072

## 2016-08-25 LAB — CRYPTOCOCCAL ANTIGEN: CRYPTO AG: NEGATIVE

## 2016-08-25 LAB — GLUCOSE, CAPILLARY
GLUCOSE-CAPILLARY: 234 mg/dL — AB (ref 65–99)
GLUCOSE-CAPILLARY: 183 mg/dL — AB (ref 65–99)
Glucose-Capillary: 210 mg/dL — ABNORMAL HIGH (ref 65–99)
Glucose-Capillary: 226 mg/dL — ABNORMAL HIGH (ref 65–99)

## 2016-08-25 LAB — ACID FAST SMEAR (AFB, MYCOBACTERIA)

## 2016-08-25 LAB — ACID FAST SMEAR (AFB)
ACID FAST SMEAR - AFSCU2: NEGATIVE
ACID FAST SMEAR - AFSCU2: NEGATIVE

## 2016-08-25 LAB — CBC
HCT: 37.8 % — ABNORMAL LOW (ref 39.0–52.0)
Hemoglobin: 12.8 g/dL — ABNORMAL LOW (ref 13.0–17.0)
MCH: 28.3 pg (ref 26.0–34.0)
MCHC: 33.9 g/dL (ref 30.0–36.0)
MCV: 83.4 fL (ref 78.0–100.0)
Platelets: 297 10*3/uL (ref 150–400)
RBC: 4.53 MIL/uL (ref 4.22–5.81)
RDW: 14.7 % (ref 11.5–15.5)
WBC: 10.2 10*3/uL (ref 4.0–10.5)

## 2016-08-25 LAB — PROCALCITONIN: Procalcitonin: <0.1 ng/mL

## 2016-08-25 LAB — MPO/PR-3 (ANCA) ANTIBODIES

## 2016-08-25 SURGERY — VIDEO BRONCHOSCOPY WITHOUT FLUORO
Anesthesia: Moderate Sedation | Laterality: Bilateral

## 2016-08-25 MED ORDER — FENTANYL CITRATE (PF) 100 MCG/2ML IJ SOLN
INTRAMUSCULAR | Status: AC
  Filled 2016-08-25: qty 4

## 2016-08-25 MED ORDER — LIDOCAINE HCL (PF) 1 % IJ SOLN
INTRAMUSCULAR | Status: DC | PRN
  Administered 2016-08-25: 6 mL

## 2016-08-25 MED ORDER — PHENYLEPHRINE HCL 0.25 % NA SOLN
NASAL | Status: DC | PRN
  Administered 2016-08-25: 2 via NASAL

## 2016-08-25 MED ORDER — SODIUM CHLORIDE 0.9 % IV SOLN
Freq: Once | INTRAVENOUS | Status: AC
Start: 2016-08-25 — End: 2016-08-25
  Administered 2016-08-25: 13:00:00 via INTRAVENOUS

## 2016-08-25 MED ORDER — FENTANYL CITRATE (PF) 100 MCG/2ML IJ SOLN
INTRAMUSCULAR | Status: DC | PRN
  Administered 2016-08-25: 50 ug via INTRAVENOUS
  Administered 2016-08-25: 25 ug via INTRAVENOUS

## 2016-08-25 MED ORDER — MIDAZOLAM HCL 10 MG/2ML IJ SOLN
INTRAMUSCULAR | Status: DC | PRN
  Administered 2016-08-25: 1 mg via INTRAVENOUS
  Administered 2016-08-25: 3 mg via INTRAVENOUS

## 2016-08-25 MED ORDER — MIDAZOLAM HCL 5 MG/ML IJ SOLN
INTRAMUSCULAR | Status: AC
Start: 2016-08-25 — End: 2016-08-25
  Filled 2016-08-25: qty 2

## 2016-08-25 MED ORDER — LIDOCAINE HCL 2 % EX GEL
CUTANEOUS | Status: DC | PRN
  Administered 2016-08-25: 1

## 2016-08-25 MED ORDER — HYDROCOD POLST-CPM POLST ER 10-8 MG/5ML PO SUER
5.0000 mL | Freq: Two times a day (BID) | ORAL | Status: DC | PRN
  Administered 2016-08-25 – 2016-08-26 (×3): 5 mL via ORAL
  Filled 2016-08-25 (×4): qty 5

## 2016-08-25 NOTE — Progress Notes (Signed)
Inpatient Diabetes Program Recommendations  AACE/ADA: New Consensus Statement on Inpatient Glycemic Control (2015)  Target Ranges:  Prepandial:   less than 140 mg/dL      Peak postprandial:   less than 180 mg/dL (1-2 hours)      Critically ill patients:  140 - 180 mg/dL   Results for [name] [name], [name] [name] (MRN [id number]) as of 08/25/2016 10:55  Ref. Range 08/24/2016 08:13 08/24/2016 13:01 08/24/2016 16:57 08/24/2016 21:19 08/25/2016 08:08  Glucose-Capillary Latest Ref Range: 65 - 99 mg/dL 255 (H) 219 (H) 186 (H) 180 (H) 234 (H)   Review of Glycemic Control  Diabetes history: DM 2 Outpatient Diabetes medications: Metformin 500 mg BID Current orders for Inpatient glycemic control: Novolog Moderate + HS scale  Inpatient Diabetes Program Recommendations:   Glucose above inpatient goal between 140-180 mg/dl. Consider low dose basal insulin for glucose control while inpatient, Lantus 10-12 units Q24 hours.  Thanks,  Tama Headings RN, MSN, [facility] Inpatient Diabetes Coordinator Team Pager [phone] (8a-5p)

## 2016-08-25 NOTE — Progress Notes (Signed)
Video bronchoscopy performed.  Intervention bronchial washing.  No complications noted.  Will continue to monitor.  Baltazar Apo, MD, PhD 09/01/2016, 11:01 AM [facility] [phone] or if no answer [phone]

## 2016-08-25 NOTE — Op Note (Signed)
[facility] Cardiopulmonary Patient Name: [name] [name] Date: 08/25/2016 MRN: [id number] Attending MD: Collene Gobble , MD Date of Birth: 1955/11/01 CSN: Finalized Age: 61 Admit Type: Inpatient Gender: Male Procedure:            Bronchoscopy Indications:          Hemoptysis with abnormal CXR, Abnormal CT scan of chest Providers:            Collene Gobble, MD, Cherre Huger RRT, RCP, Phillis Knack                        RRT, RCP Referring MD:          Medicines:            Midazolam 4 mg IV, Fentanyl 75 mcg IV, Lidocaine 1%                        applied to cords 10 mL, Lidocaine 1% applied to the                        tracheobronchial tree 10 mL Complications:        No immediate complications Estimated Blood Loss: Estimated blood loss: 20 mL suctioned Procedure:            Pre-Anesthesia Assessment:                       - A History and Physical has been performed. Patient                        meds and allergies have been reviewed. The risks and                        benefits of the procedure and the sedation options and                        risks were discussed with the patient. All questions                        were answered and informed consent was obtained.                        Patient identification and proposed procedure were                        verified prior to the procedure by the physician in the                        procedure room. Mental Status Examination: alert and                        oriented. Airway Examination: normal oropharyngeal                        airway. Respiratory Examination: clear to auscultation.                        CV Examination: RRR, no murmurs, no S3 or S4. ASA Grade  Assessment: I - A normal healthy patient. After                        reviewing the risks and benefits, the patient was                        deemed in satisfactory condition to undergo the                         procedure. The anesthesia plan was to use moderate                        sedation / analgesia (conscious sedation). Immediately                        prior to administration of medications, the patient was                        re-assessed for adequacy to receive sedatives. The                        heart rate, respiratory rate, oxygen saturations, blood                        pressure, adequacy of pulmonary ventilation, and                        response to care were monitored throughout the                        procedure. The physical status of the patient was                        re-assessed after the procedure.                       After obtaining informed consent, the bronchoscope was                        passed under direct vision. Throughout the procedure,                        the patient's blood pressure, pulse, and oxygen                        saturations were monitored continuously. the [id number]                        [id number] scope was introduced through the right nostril                        and advanced to the tracheobronchial tree. The                        procedure was accomplished without difficulty. The                        patient tolerated the procedure fairly well. Scope In: 1:55:08 PM Scope Out: 2:03:59 PM Findings:      The nasopharynx/oropharynx appears normal. The larynx appears normal.  The vocal cords appear normal. The subglottic space is normal. The       trachea is of normal caliber. There is some hyperemia and prominent       mucosal vasculature present lateral trachea but no active bleeding in       this region. There was fresh blood eminating from bilateral mainstem       bronchi, but source appeared to be the left. The carina is sharp. The       tracheobronchial tree was examined to at least the first subsegmental       level; there are no endobronchial lesions, and no mucous. Bloody       secretions were coming from the left and  returned after suctioning.       Suspect that these were coming from the LLL based on the return after       suctioning.There was no ulceration or mass lesion seen.      Washings were obtained in the superior lingula segment of the left upper       lobe and sent for bacterial, AFB and fungal analysis. The return was       bloody. Impression:           - Hemoptysis with abnormal CXR                       - Abnormal CT scan of chest                       - Blood that came from all airways but which appeared                        to be primarily from the LLL                       - No endobronchial lesions to explain bleeding.                       - Consider possible diffuse alveolar hemorrhage,                        vasculitis, discrete bleeding lesion that was too                        distal to be seen bronchoscopically.                       - Washings were obtained. Moderate Sedation:      Moderate (conscious) sedation was personally administered by the       endoscopist. The following parameters were monitored: oxygen saturation,       heart rate, blood pressure, respiratory rate, EKG, adequacy of pulmonary       ventilation, and response to care. Total physician intraservice time was       15 minutes. Recommendation:       - Await culture results. Procedure Code(s):    --- Professional ---                       [phone], Bronchoscopy, rigid or flexible, including                        fluoroscopic guidance, when performed; diagnostic, with  cell washing, when performed (separate procedure)                       [id number], Moderate sedation services provided by the same                        physician or other qualified health care professional                        performing the diagnostic or therapeutic service that                        the sedation supports, requiring the presence of an                        independent trained observer to assist in the                         monitoring of the patient's level of consciousness and                        physiological status; initial 15 minutes of                        intraservice time, patient age 58 years or older Diagnosis Code(s):    --- Professional ---                       R04.2, Hemoptysis                       R91.8, Other nonspecific abnormal finding of lung field                       R93.8, Abnormal findings on diagnostic imaging of other                        specified body structures CPT copyright 2016 [employer] Medical Association. All rights reserved. The codes documented in this report are preliminary and upon coder review may  be revised to meet current compliance requirements. Collene Gobble, MD Collene Gobble, MD 08/25/2016 2:23:54 PM Number of Addenda: 0

## 2016-08-25 NOTE — Progress Notes (Signed)
   Subjective: Patient was evaluated this morning. He reports coughing up about half a cup of blood in the past 24 hours. This occurs mainly after eating. He states that after each meal he has episodes of hemoptysis. He denies any pain other than continued sore throat.  He reports having night sweats last night.  Objective:  Vital signs in last 24 hours: Vitals:   08/24/16 0444 08/24/16 1700 08/24/16 2120 08/25/16 0400  BP: 115/68 (!) 148/83 (!) 144/64 133/60  Pulse: 78 80 84 88  Resp: _0 Temp: 98.2 F (36.8 C) 98.1 F (36.7 C) 98.2 F (36.8 C) 98.1 F (36.7 C)  TempSrc: Oral Oral Oral Oral  SpO2: 94% 97% 99% 99%  Weight:      Height:       Physical Exam  Constitutional:  overweight  HENT:  Enlarged tonsil on right side  Cardiovascular: Normal rate and regular rhythm.  Exam reveals no gallop and no friction rub.   No murmur heard. Pulmonary/Chest: Effort normal and breath sounds normal. No respiratory distress. He has no wheezes. He has no rales.  Skin: Skin is warm and dry.     Assessment/Plan:  Active Problems:   Hemoptysis   Weight loss   Night sweats   OSA (obstructive sleep apnea)   Morbid obesity (HCC)   Lung nodules  Hemoptysis with progressive cough  Patient reports a loss of 1/2 a cup of blood this morning after coughing.  He has a sytrofoam cup filled with the blood by his bedside and I agree it appears to be approximatley 4oz bright red blood.  He has hemoptysis after eating a meal. He is scheduled for bronchoscopy today.  3 sputums have been collected for acid fast culture and smear. First smear resulted negative.  Second and third smear are pending.  Azithromycin was discontinued yesterday since this could skew results of the acid-fast culture and smear.  Added tussinex to help with cough suppression. Infectious disease and PCCM is following.  Crypto ag negative from serum. -follow Acid-fast culture and smear 3.   -quantiFERON assay pending -  Airborne precautions - Tussinex - Appreciate IDs recommendations - Appreciate PCCMs recommendations - bronchoscopy with BAL for AFB - pending histo, blasto ag from urine pending per ID   Diabetes type II Recently diagnosed by his cardiologist - [facility]  Hypertension Continuing home blood pressure medications -Carvedilol 12.5 mg once daily -Lisinopril 10 mg once daily  History of coronary artery disease status post PCI 7 years ago and angioplasty approximately one week ago  Patient states that he does not want to take prasugrel and aspirin prior to his bronchoscopy and refused them yesterday.  He also wants to hold them for a day or two due to the recurrence of hemoptysis.  These medications are likely exacerbating his hemoptysis.   -Rosuvastatin 10 mg once daily - holding Prasugrel 10 mg once daily - holding aspirin 388m  FEN/GI:NPO for bronchoscopy then can restart Normal diet DVT/PE prophylaxis: SCDs Code:Full code  Dispo: Anticipated discharge pending finishing TB work up.   JValinda Party DO 08/25/2016, 8:47 AM Pager: [phone]

## 2016-08-25 NOTE — Discharge Summary (Signed)
Name: [name] [name] MRN: [id number] DOB: 23-Feb-1956 61 y.o. PCP: No Pcp Per Patient  Date of Admission: 08/23/2016 11:25 AM Date of Discharge: 08/26/2016 Attending Physician: Dr. Beryle Beams  Discharge Diagnosis: 1. Hemoptysis  Active Problems:   Hemoptysis   Weight loss   Night sweats   OSA (obstructive sleep apnea)   Morbid obesity (HCC)   Lung nodules   Discharge Medications: Allergies as of 08/26/2016   No Known Allergies     Medication List    STOP taking these medications   aspirin 325 MG tablet   aspirin-sod bicarb-citric acid 325 MG Tbef tablet Commonly known as:  ALKA-SELTZER   prasugrel 10 MG Tabs tablet Commonly known as:  EFFIENT   ROBITUSSIN 12 HOUR COUGH 30 MG/5ML liquid Generic drug:  dextromethorphan     TAKE these medications   carvedilol 12.5 MG tablet Commonly known as:  COREG Take 12.5 mg by mouth daily.   CHLORASEPTIC 1.4 % Liqd Generic drug:  phenol Use as directed 1 spray in the mouth or throat as needed for throat irritation / pain.   chlorpheniramine-HYDROcodone 10-8 MG/5ML Suer Commonly known as:  TUSSIONEX Take 5 mLs by mouth every 12 (twelve) hours as needed for cough.   EMERGEN-C ELECTRO MIX Pack Take 1 packet by mouth daily as needed (supplement).   lisinopril 10 MG tablet Commonly known as:  PRINIVIL,ZESTRIL Take 10 mg by mouth daily.   metFORMIN 500 MG tablet Commonly known as:  GLUCOPHAGE Take 500 mg by mouth 2 (two) times daily with a meal.   rosuvastatin 10 MG tablet Commonly known as:  CRESTOR Take 10 mg by mouth at bedtime.     ASK your doctor about these medications   azithromycin 250 MG tablet Commonly known as:  ZITHROMAX Take 1 tablet (250 mg total) by mouth daily. Ask about: Should I take this medication?       Disposition and follow-up:   [name] [name] was discharged from [facility] in good condition.  At the hospital follow up visit please address:  1.  Any continued  Hemoptysis. 2.  Did he follow up with cardiology?  He had stopped prasugrel and aspirin.  Did those get restarted?  He recently had a balloon angioplasty and started on prasugrel one week prior to admission.  He will need to understand the risk of not being dual antiplatelet therapy.    3. Patient was recently diagnosed with diabetes from his cardiologist in [place]. No records in our system. He expressed interest and being followed by a provider at the internal medicine clinic.  Please obtain an A1c. Patient is currently on metformin 500 mg twice a day.  2.  Labs / imaging needed at time of follow-up: CT chest, 6-8 weeks to follow up pulmonary nodules  3.  Pending labs/ test needing follow-up: none  Follow-up Appointments: Follow-up Information    [facility] Follow up.   Why:  April 20th at 1:15pm Contact information: [address] [facility]. Schedule an appointment as soon as possible for a visit.   Why:  Call to make an appointment for next week           Hospital Course by problem list: Active Problems:   Hemoptysis   Weight loss   Night sweats   OSA (obstructive sleep apnea)   Morbid obesity (HCC)   Lung nodules   Hemoptysis with progressive  cough  Patient presented to the ED for 3 month history of productive cough that progressed to hemoptysis two days prior to admission. During admission he would cough up about 4oz of bright red blood in a 24 hour period.  CT angiogram was negative for pulmonary embolism. It did show Numerous patchy pulmonary nodules throughout both lungs. Due to patient's travel to [place], symptoms of night sweats and hemoptysis patient was worked up for tuberculosis. Two acid fast smears were negative. BAL was negative (total of 3 acid fast smears negative) and quantiFERON assay was negative.  Patient had bronchoscopy April 3 that noted bloody secretions  from the left lower lung base. There was no ulceration or mass lesion seen. There was no discrete bleeding lesion that was noted. C-ANCA,P-ANCA, Myeloperoxidase abs were negative.  Patient was started on tussinex and reports great improvement in his cough and hemoptysis.  Suspect patient had bronchitis and was placed on azithromycin and tussinex on discharge with [facility] follow up.  Diabetes type II Recently diagnosed by his cardiologist in [place].  Patient was started on metformin 543m BID.  [facility]     Hypertension Stable during admission.  He was Continuined on home blood pressure medications Carvedilol 12.5 mg once daily And Lisinopril 10 mg once daily  History of coronary artery diseasestatus post PCI 7 years ago and angioplasty approximately one week ago before admission Patient refused prasug[place] aspirin during stay.  Discussed with patient importance of following up with cardiology after discharge. He stated that [place]  He was continued on Rosuvastatin 10 mg once daily.  Discharge Vitals:   BP 134/80 (BP Location: Left Arm)   Pulse 82   Temp 97.9 F (36.6 C) (Oral)   Resp 20   Ht _0  (1.778 m)   Wt 241 lb (109.3 kg)   SpO2 99%   BMI 34.58 kg/m   Pertinent Labs, Studies, and Procedures:  CT Chest Broncoscopy  Discharge Instructions: Discharge Instructions    Diet - low sodium heart healthy    Complete by:  As directed    Discharge instructions    Complete by:  As directed    Please take take your antibiotic for the next 6 days Please take tussinex to help with your cough Please follow up with your Cardiologist on April 13th and the Internal Medicine Clinic April 20th at 1:15pm   Increase activity slowly    Complete by:  As directed       Signed: JValinda Party DO 09/02/2016, 4:20 PM   Pager: [phone]

## 2016-08-25 NOTE — Progress Notes (Signed)
Subjective:   He was irritated that bronchoscopy delayed till 2 when he has conference calls scheduled  He is still coughing up fair amount of bloody sputum and he showed me a "dixie cup" full of bloody material   Antibiotics:  Anti-infectives    Start     Dose/Rate Route Frequency Ordered Stop   08/24/16 1800  cefTRIAXone (ROCEPHIN) 1 g in dextrose 5 % 50 mL IVPB  Status:  Discontinued     1 g 100 mL/hr over 30 Minutes Intravenous Every 24 hours 08/23/16 2138 08/24/16 0656   08/24/16 1000  azithromycin (ZITHROMAX) tablet 250 mg  Status:  Discontinued     250 mg Oral Daily 08/23/16 2138 08/24/16 1520   08/23/16 1815  cefTRIAXone (ROCEPHIN) 1 g in dextrose 5 % 50 mL IVPB     1 g 100 mL/hr over 30 Minutes Intravenous  Once 08/23/16 1808 08/23/16 1901   08/23/16 1815  azithromycin (ZITHROMAX) tablet 500 mg     500 mg Oral  Once 08/23/16 1808 08/23/16 1829      Medications: Scheduled Meds: . carvedilol  12.5 mg Oral Q lunch  . insulin aspart  0-15 Units Subcutaneous TID WC  . insulin aspart  0-5 Units Subcutaneous QHS  . lidocaine  1 application Topical Once  . lisinopril  10 mg Oral Daily  . rosuvastatin  10 mg Oral QHS   Continuous Infusions: PRN Meds:.acetaminophen **OR** acetaminophen, chlorpheniramine-HYDROcodone, phenylephrine, senna-docusate    Objective: Weight change:   Intake/Output Summary (Last 24 hours) at 08/25/16 1239 Last data filed at 08/25/16 1027  Gross per 24 hour  Intake             1040 ml  Output                0 ml  Net             1040 ml   Blood pressure 133/60, pulse 88, temperature 98.1 F (36.7 C), temperature source Oral, resp. rate 19, height _0  (1.778 m), weight 241 lb 2.9 oz (109.4 kg), SpO2 99 %. Temp:  [98.1 F (36.7 C)-98.2 F (36.8 C)] 98.1 F (36.7 C) (04/03 0400) Pulse Rate:  [80-88] 88 (04/03 0400) Resp:  [18-19] 19 (04/03 0400) BP: (133-148)/(60-83) 133/60 (04/03 0400) SpO2:  [97 %-99 %] 99 % (04/03  0400)  Physical Exam: General: Alert and awake, oriented x3, not in any acute distress. HEENT: anicteric sclera, pupils reactive to light and accommodation, EOMI CVS regular rate, normal r,  no murmur rubs or gallops Chest: clear to auscultation bilaterally, no wheezing, rales or rhonchi Abdomen: soft nontender, nondistended, normal bowel sounds, Extremities: no  clubbing or edema noted bilaterally Neuro: nonfocal  CBC:  CBC Latest Ref Rng & Units 08/25/2016 08/24/2016 08/23/2016  WBC 4.0 - 10.5 K/uL 10.2 9.3 8.8  Hemoglobin 13.0 - 17.0 g/dL 12.8(L) 12.2(L) 13.2  Hematocrit 39.0 - 52.0 % 37.8(L) 35.2(L) 38.8(L)  Platelets 150 - 400 K/uL 297 244 281      BMET  Recent Labs  08/24/16 0054 08/24/16 1216  NA 132* 134*  K 3.7 3.9  CL 99* 101  CO2 23 26  GLUCOSE 222* 227*  BUN 13 9  CREATININE 0.65 0.62  CALCIUM 9.3 9.7     Liver Panel   Recent Labs  08/24/16 1216  PROT 7.1  ALBUMIN 3.3*  AST 21  ALT 29  ALKPHOS 63  BILITOT 0.8  Sedimentation Rate No results for input(s): ESRSEDRATE in the last 72 hours. C-Reactive Protein No results for input(s): CRP in the last 72 hours.  Micro Results: Recent Results (from the past 720 hour(s))  Respiratory Panel by PCR     Status: None   Collection Time: 08/23/16  7:49 PM  Result Value Ref Range Status   Adenovirus NOT DETECTED NOT DETECTED Final   Coronavirus 229E NOT DETECTED NOT DETECTED Final   Coronavirus HKU1 NOT DETECTED NOT DETECTED Final   Coronavirus NL63 NOT DETECTED NOT DETECTED Final   Coronavirus OC43 NOT DETECTED NOT DETECTED Final   Metapneumovirus NOT DETECTED NOT DETECTED Final   Rhinovirus / Enterovirus NOT DETECTED NOT DETECTED Final   Influenza A NOT DETECTED NOT DETECTED Final   Influenza B NOT DETECTED NOT DETECTED Final   Parainfluenza Virus 1 NOT DETECTED NOT DETECTED Final   Parainfluenza Virus 2 NOT DETECTED NOT DETECTED Final   Parainfluenza Virus 3 NOT DETECTED NOT DETECTED Final    Parainfluenza Virus 4 NOT DETECTED NOT DETECTED Final   Respiratory Syncytial Virus NOT DETECTED NOT DETECTED Final   Bordetella pertussis NOT DETECTED NOT DETECTED Final   Chlamydophila pneumoniae NOT DETECTED NOT DETECTED Final   Mycoplasma pneumoniae NOT DETECTED NOT DETECTED Final  Acid Fast Smear (AFB)     Status: None   Collection Time: 08/23/16  8:07 PM  Result Value Ref Range Status   AFB Specimen Processing Concentration  Final   Acid Fast Smear Negative  Final    Comment: (NOTE) Performed At: [facility] [place], [place] [id number] Lindon Romp MD TK:[id number]    Source (AFB) SPUTUM  Final  Culture, respiratory (NON-Expectorated)     Status: None (Preliminary result)   Collection Time: 08/23/16  9:41 PM  Result Value Ref Range Status   Specimen Description EXPECTORATED SPUTUM  Final   Special Requests NONE  Final   Gram Stain   Final    MODERATE WBC PRESENT, PREDOMINANTLY PMN MODERATE GRAM NEGATIVE RODS MODERATE GRAM POSITIVE COCCI IN PAIRS FEW GRAM POSITIVE COCCI IN CLUSTERS FEW GRAM POSITIVE RODS RARE YEAST    Culture CULTURE REINCUBATED FOR BETTER GROWTH  Final   Report Status PENDING  Incomplete    Studies/Results: Dg Chest 2 View  Result Date: 08/23/2016 CLINICAL DATA:  Hemoptysis for the last 24 hours.  Ex-smoker. EXAM: CHEST  2 VIEW COMPARISON:  None. FINDINGS: Mildly enlarged cardiac silhouette. Mildly prominent pulmonary vasculature and interstitial markings. Flattening of the hemidiaphragms on the lateral view. Mild thoracic spine degenerative changes. IMPRESSION: No acute abnormality.  Mild cardiomegaly and mild changes of COPD. Electronically Signed   By: Claudie Revering M.D.   On: 08/23/2016 14:12   Ct Angio Chest Pe W/cm &/or Wo Cm  Result Date: 08/23/2016 CLINICAL DATA:  Hemoptysis. EXAM: CT ANGIOGRAPHY CHEST WITH CONTRAST TECHNIQUE: Multidetector CT imaging of the chest was performed using the standard protocol during bolus  administration of intravenous contrast. Multiplanar CT image reconstructions and MIPs were obtained to evaluate the vascular anatomy. CONTRAST:  80 cc Isovue 370 IV. COMPARISON:  Chest radiograph from earlier today. FINDINGS: Motion degraded scan. Cardiovascular: The study is moderate to high quality for the evaluation of pulmonary embolism, mildly limited by motion degradation. There are no filling defects in the central, lobar, segmental or subsegmental pulmonary artery branches to suggest acute pulmonary embolism. Atherosclerotic thoracic aorta. Ectatic ascending thoracic aorta, maximum diameter 4.0 cm. Dilated main pulmonary artery (3.5 cm diameter). Mild cardiomegaly. No  significant pericardial fluid/thickening. Left anterior descending, left circumflex and right coronary atherosclerosis. Mediastinum/Nodes: Bilateral calcified thyroid nodules, largest 1.4 cm on the left. Unremarkable esophagus. No axillary adenopathy. Mildly enlarged 1.3 cm right paratracheal node (series 5/ image 32). Mildly enlarged 1.2 cm right subcarinal node (series 5/image 50). Mildly enlarged 1.0 cm AP window node (series 5/ image 37). Mildly enlarged 1.3 cm right hilar node (series 5/ image 39). No additional pathologically enlarged mediastinal or hilar lymph nodes. Lungs/Pleura: No pneumothorax. No pleural effusion. Mild centrilobular emphysema with diffuse bronchial wall thickening. There are numerous patchy pulmonary nodules throughout both lungs, most of which appear to demonstrate ill-defined margins with ground-glass halos, and which generally appear centrilobular in distribution, the largest 1.7 cm in the right middle lobe (series 7/ image 53), with additional representative nodules measuring 0.9 cm in the peripheral right lower lobe (series 7/ image 69) and 0.9 cm in the lingula (series 7/ image 49). Upper abdomen: Probable tiny hiatal hernia. Musculoskeletal: No aggressive appearing focal osseous lesions. Moderate thoracic  spondylosis. Review of the MIP images confirms the above findings. IMPRESSION: 1. No evidence of pulmonary embolism on this motion degraded scan. 2. Numerous patchy pulmonary nodules throughout both lungs, most of which demonstrate ill-defined margins with ground-glass halos, generally centrilobular in distribution. These findings are most suggestive of an infectious etiology such as due to bronchopneumonia or atypical pneumonia such as fungal pneumonia. Pulmonary metastatic disease is considered less likely but not entirely excluded and a follow-up post treatment chest CT is recommended. 3. Nonspecific mediastinal and right hilar lymphadenopathy, which should also be followed up on a posttreatment chest CT with IV contrast in 2-3 months. 4. Aortic atherosclerosis. Ectatic ascending thoracic aorta, maximum diameter 4.0 cm. Recommend annual imaging followup by CTA or MRA. This recommendation follows 2010 ACCF/AHA/AATS/ACR/ASA/SCA/SCAI/SIR/STS/SVM Guidelines for the Diagnosis and Management of Patients with Thoracic Aortic Disease. Circulation. 2010; 121: H419-F790. 5. Dilated main pulmonary artery, suggesting pulmonary arterial hypertension. 6. Mild cardiomegaly . 7. Three-vessel coronary atherosclerosis. 8. Mild emphysema with diffuse bronchial wall thickening, suggesting COPD. Electronically Signed   By: Ilona Sorrel M.D.   On: 08/23/2016 17:18      Assessment/Plan:  INTERVAL HISTORY:   One AFB sputa is negative, 2nd one pending as is QF gold and bronchoscopy   Active Problems:   Hemoptysis   Weight loss   Night sweats   OSA (obstructive sleep apnea)   Morbid obesity (HCC)   Lung nodules    Elpidio Thielen is a 61 y.o. male with  61 y.o. 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 He has a 40 pound weight loss in his history as well though this was intentional through carbohydrate restriction. He has been found to have nodules on CT scan of the lungs.  #1 Lung nodules with concern for pulmonary TB: I am encouaraged that he does NOT have cavitary pathology on imaging. He does certainly have multiple etiological risk factors for TB and the hemoptysis and weight loss if it was not intentional would be  concerning.  Alternative explanation for the hemoptysis is that addition of 2nd antiplatelet drug to his ASA increased his bleeding risk and this ppt the hemoptysis  One AFB sputa is negative  2nd one is reportedly being processed  He will get bronchoscopy with BAL for AFB, fungal and bacterial pathogens  Crypto ag negative from serum  Histo, blasto ag from urine ordered along with cocci abs from serum  QF pending  After we have the above data will be able to make more meaningful rational decision       LOS: 0 days   Alcide Evener 08/25/2016, 12:39 PM

## 2016-08-26 DIAGNOSIS — Z955 Presence of coronary angioplasty implant and graft: Secondary | ICD-10-CM

## 2016-08-26 DIAGNOSIS — G4733 Obstructive sleep apnea (adult) (pediatric): Secondary | ICD-10-CM

## 2016-08-26 DIAGNOSIS — R918 Other nonspecific abnormal finding of lung field: Secondary | ICD-10-CM

## 2016-08-26 DIAGNOSIS — I2510 Atherosclerotic heart disease of native coronary artery without angina pectoris: Secondary | ICD-10-CM

## 2016-08-26 DIAGNOSIS — E119 Type 2 diabetes mellitus without complications: Secondary | ICD-10-CM

## 2016-08-26 DIAGNOSIS — R0609 Other forms of dyspnea: Secondary | ICD-10-CM

## 2016-08-26 DIAGNOSIS — R634 Abnormal weight loss: Secondary | ICD-10-CM

## 2016-08-26 DIAGNOSIS — I10 Essential (primary) hypertension: Secondary | ICD-10-CM

## 2016-08-26 DIAGNOSIS — R61 Generalized hyperhidrosis: Secondary | ICD-10-CM

## 2016-08-26 LAB — QUANTIFERON IN TUBE
QFT TB AG MINUS NIL VALUE: 0 IU/mL
QUANTIFERON MITOGEN VALUE: 2.05 IU/mL
QUANTIFERON TB AG VALUE: 0.02 [IU]/mL
QUANTIFERON TB GOLD: NEGATIVE
Quantiferon Nil Value: 0.02 IU/mL

## 2016-08-26 LAB — ADENOSINE DEAMINASE, SERUM: ADENOSINE DEAMINASE BLD: 20.09 U/L — AB (ref 0.00–15.00)

## 2016-08-26 LAB — ANCA TITERS
Atypical P-ANCA titer: <1:20 {titer}
P-ANCA: <1:20 {titer}

## 2016-08-26 LAB — CULTURE, RESPIRATORY W GRAM STAIN: Culture: NORMAL

## 2016-08-26 LAB — APTT: aPTT: 33 seconds (ref 24–36)

## 2016-08-26 LAB — CBC
HEMATOCRIT: 36.8 % — AB (ref 39.0–52.0)
HEMOGLOBIN: 12.4 g/dL — AB (ref 13.0–17.0)
MCH: 28.4 pg (ref 26.0–34.0)
MCHC: 33.7 g/dL (ref 30.0–36.0)
MCV: 84.4 fL (ref 78.0–100.0)
Platelets: 331 10*3/uL (ref 150–400)
RBC: 4.36 MIL/uL (ref 4.22–5.81)
RDW: 14.6 % (ref 11.5–15.5)
WBC: 9.2 10*3/uL (ref 4.0–10.5)

## 2016-08-26 LAB — HISTOPLASMA ANTIGEN, URINE: Histoplasma Antigen, urine: <0.5 (ref <0.5)

## 2016-08-26 LAB — ACID FAST SMEAR (AFB): ACID FAST SMEAR - AFSCU2: NEGATIVE

## 2016-08-26 LAB — ACID FAST SMEAR (AFB, MYCOBACTERIA)

## 2016-08-26 LAB — PROTIME-INR
INR: 1.01
Prothrombin Time: 13.3 seconds (ref 11.4–15.2)

## 2016-08-26 LAB — GLUCOSE, CAPILLARY
Glucose-Capillary: 236 mg/dL — ABNORMAL HIGH (ref 65–99)
Glucose-Capillary: 222 mg/dL — ABNORMAL HIGH (ref 65–99)

## 2016-08-26 LAB — CULTURE, RESPIRATORY

## 2016-08-26 LAB — QUANTIFERON TB GOLD ASSAY (BLOOD)

## 2016-08-26 MED ORDER — HYDROCOD POLST-CPM POLST ER 10-8 MG/5ML PO SUER: 5.0000 mL | Freq: Two times a day (BID) | ORAL | 0 refills | Status: DC | PRN

## 2016-08-26 MED ORDER — AZITHROMYCIN 250 MG PO TABS: 250.0000 mg | ORAL_TABLET | Freq: Every day | ORAL | Status: DC

## 2016-08-26 MED ORDER — AZITHROMYCIN 250 MG PO TABS: 250.0000 mg | ORAL_TABLET | Freq: Every day | ORAL | 0 refills | Status: AC

## 2016-08-26 MED ORDER — AZITHROMYCIN 250 MG PO TABS
500.0000 mg | ORAL_TABLET | Freq: Every day | ORAL | Status: AC
  Administered 2016-08-26: 500 mg via ORAL
  Filled 2016-08-26 (×2): qty 2

## 2016-08-26 NOTE — Progress Notes (Signed)
   Subjective: Patient was evaluated this morning on rounds. He states that his hemoptysis has decreased due to the cough suppressant he was given yesterday. He denies any fever or chills, shortness of breath or chest pain.  Objective:  Vital signs in last 24 hours: Vitals:   08/25/16 1430 08/25/16 1435 08/25/16 1535 08/25/16 1951  BP: 117/65 (!) 120/56 113/61 130/63  Pulse: 86 87 83 98  Resp: _0 Temp:   98.4 F (36.9 C) 98.2 F (36.8 C)  TempSrc:   Oral Oral  SpO2: 97% 93% 92% 93%  Weight:      Height:       Physical Exam  Cardiovascular: Normal rate, regular rhythm and normal heart sounds.  Exam reveals no gallop and no friction rub.   No murmur heard. Pulmonary/Chest: Effort normal and breath sounds normal. No respiratory distress. He has no wheezes. He has no rales.  Skin: Skin is warm and dry.     Assessment/Plan:  Active Problems:   Hemoptysis   Weight loss   Night sweats   OSA (obstructive sleep apnea)   Morbid obesity (HCC)   Lung nodules  Hemoptysis with progressive cough  Patient reports improvement with his hemoptysis due to cough suppression medication started yesterday. 2 acid fast smears have been negative. BAL was negative (total of 3 acid fast smears negative) and quantiFERON assay is negative.  Patient had bronchoscopy yesterday that noted bloody secretions from the left lower lung base. There is no ulceration or mass lesion seen. Possible etiologies include infectious, vasculitis, alveolar hemorrhage. There is no discrete bleeding lesion that was noted. C-ANCA,P-ANCA, Myeloperoxidase abs are negative.  Will have patient placed on azithromycin and have patient follow up in the [facility]. - Tussinex - Appreciate IDs recommendations - Appreciate PCCMs recommendations - azithromycin  - urine histoplasma pending - fungal culture pending  Diabetes type II Recently diagnosed by his cardiologist - [place] - patient will follow up in the [facility] after discharge.  Appointment is scheduled for April 20th at 1:15   Hypertension Continuing home blood pressure medications -Carvedilol 12.5 mg once daily -Lisinopril 10 mg once daily  History of coronary artery diseasestatus post PCI 7 years ago and angioplasty approximately one week ago  Patient states that he does not want to take prasugrel and aspirin.  These medications have been held as they are likely exacerbating his hemoptysis.  Will have patient follow up with cardiology to discuss restarting medications after discharge.  Discussed with patient importance of following up with cardiology after discharge. He states that he will see his cardiologist next week.     -Rosuvastatin 10 mg once daily - holding Prasugrel 10 mg once daily - holding aspirin 323m  Dispo: Anticipated discharge today.   JValinda Party DO 08/26/2016, 2:18 PM Pager: [phone]

## 2016-08-26 NOTE — Progress Notes (Signed)
Subjective:  No new complaints  Antibiotics:  Anti-infectives    Start     Dose/Rate Route Frequency Ordered Stop   08/27/16 1000  azithromycin (ZITHROMAX) tablet 250 mg  Status:  Discontinued     250 mg Oral Daily 08/26/16 0939 08/26/16 1944   08/26/16 1100  azithromycin (ZITHROMAX) tablet 500 mg     500 mg Oral Daily 08/26/16 0939 08/26/16 1136   08/26/16 0000  azithromycin (ZITHROMAX) 250 MG tablet     250 mg Oral Daily 08/26/16 1531 09/01/16 2359   08/24/16 1800  cefTRIAXone (ROCEPHIN) 1 g in dextrose 5 % 50 mL IVPB  Status:  Discontinued     1 g 100 mL/hr over 30 Minutes Intravenous Every 24 hours 08/23/16 2138 08/24/16 0656   08/24/16 1000  azithromycin (ZITHROMAX) tablet 250 mg  Status:  Discontinued     250 mg Oral Daily 08/23/16 2138 08/24/16 1520   08/23/16 1815  cefTRIAXone (ROCEPHIN) 1 g in dextrose 5 % 50 mL IVPB     1 g 100 mL/hr over 30 Minutes Intravenous  Once 08/23/16 1808 08/23/16 1901   08/23/16 1815  azithromycin (ZITHROMAX) tablet 500 mg     500 mg Oral  Once 08/23/16 1808 08/23/16 1829      Medications: Scheduled Meds:  Continuous Infusions: PRN Meds:.    Objective: Weight change:   Intake/Output Summary (Last 24 hours) at 08/26/16 1953 Last data filed at 08/26/16 1421  Gross per 24 hour  Intake             1140 ml  Output                0 ml  Net             1140 ml   Blood pressure 134/80, pulse 82, temperature 97.9 F (36.6 C), temperature source Oral, resp. rate 20, height _0  (1.778 m), weight 241 lb (109.3 kg), SpO2 99 %. Temp:  [97.9 F (36.6 C)] 97.9 F (36.6 C) (04/04 1450) Pulse Rate:  [82] 82 (04/04 1450) Resp:  [20] 20 (04/04 1450) BP: (134)/(80) 134/80 (04/04 1450) SpO2:  [99 %] 99 % (04/04 1450)  Physical Exam: General: Alert and awake, oriented x3, not in any acute distress. HEENT: anicteric sclera, pupils reactive to light and accommodation, EOMI CVS regular rate, normal r,  no murmur rubs or  gallops Chest: clear to auscultation bilaterally, no wheezing, rales or rhonchi Abdomen: soft nontender, nondistended, normal bowel sounds, Extremities: no  clubbing or edema noted bilaterally Neuro: nonfocal  CBC:  CBC Latest Ref Rng & Units 08/26/2016 08/25/2016 08/24/2016  WBC 4.0 - 10.5 K/uL 9.2 10.2 9.3  Hemoglobin 13.0 - 17.0 g/dL 12.4(L) 12.8(L) 12.2(L)  Hematocrit 39.0 - 52.0 % 36.8(L) 37.8(L) 35.2(L)  Platelets 150 - 400 K/uL 331 297 244      BMET  Recent Labs  08/24/16 0054 08/24/16 1216  NA 132* 134*  K 3.7 3.9  CL 99* 101  CO2 23 26  GLUCOSE 222* 227*  BUN 13 9  CREATININE 0.65 0.62  CALCIUM 9.3 9.7     Liver Panel   Recent Labs  08/24/16 1216  PROT 7.1  ALBUMIN 3.3*  AST 21  ALT 29  ALKPHOS 63  BILITOT 0.8       Sedimentation Rate No results for input(s): ESRSEDRATE in the last 72 hours. C-Reactive Protein No results for input(s): CRP in the last 72 hours.  Micro  Results: Recent Results (from the past 720 hour(s))  Respiratory Panel by PCR     Status: None   Collection Time: 08/23/16  7:49 PM  Result Value Ref Range Status   Adenovirus NOT DETECTED NOT DETECTED Final   Coronavirus 229E NOT DETECTED NOT DETECTED Final   Coronavirus HKU1 NOT DETECTED NOT DETECTED Final   Coronavirus NL63 NOT DETECTED NOT DETECTED Final   Coronavirus OC43 NOT DETECTED NOT DETECTED Final   Metapneumovirus NOT DETECTED NOT DETECTED Final   Rhinovirus / Enterovirus NOT DETECTED NOT DETECTED Final   Influenza A NOT DETECTED NOT DETECTED Final   Influenza B NOT DETECTED NOT DETECTED Final   Parainfluenza Virus 1 NOT DETECTED NOT DETECTED Final   Parainfluenza Virus 2 NOT DETECTED NOT DETECTED Final   Parainfluenza Virus 3 NOT DETECTED NOT DETECTED Final   Parainfluenza Virus 4 NOT DETECTED NOT DETECTED Final   Respiratory Syncytial Virus NOT DETECTED NOT DETECTED Final   Bordetella pertussis NOT DETECTED NOT DETECTED Final   Chlamydophila pneumoniae NOT  DETECTED NOT DETECTED Final   Mycoplasma pneumoniae NOT DETECTED NOT DETECTED Final  Acid Fast Smear (AFB)     Status: None   Collection Time: 08/23/16  8:07 PM  Result Value Ref Range Status   AFB Specimen Processing Concentration  Final   Acid Fast Smear Negative  Final    Comment: (NOTE) Performed At: [facility] [place], [place] [id number] Lindon Romp MD [id number]    Source (AFB) SPUTUM  Final  Culture, respiratory (NON-Expectorated)     Status: None   Collection Time: 08/23/16  9:41 PM  Result Value Ref Range Status   Specimen Description EXPECTORATED SPUTUM  Final   Special Requests NONE  Final   Gram Stain   Final    MODERATE WBC PRESENT, PREDOMINANTLY PMN MODERATE GRAM NEGATIVE RODS MODERATE GRAM POSITIVE COCCI IN PAIRS FEW GRAM POSITIVE COCCI IN CLUSTERS FEW GRAM POSITIVE RODS RARE YEAST    Culture Consistent with normal respiratory flora.  Final   Report Status 08/26/2016 FINAL  Final  Acid Fast Smear (AFB)     Status: None   Collection Time: 08/24/16  8:49 PM  Result Value Ref Range Status   AFB Specimen Processing Concentration  Final   Acid Fast Smear Negative  Final    Comment: (NOTE) Performed At: [facility] [place], [place] [id number] Lindon Romp MD [id number]    Source (AFB) SPUTUM  Final  Culture, bal-quantitative     Status: None (Preliminary result)   Collection Time: 08/25/16  2:00 PM  Result Value Ref Range Status   Specimen Description BRONCHIAL ALVEOLAR LAVAGE  Final   Special Requests Normal  Final   Gram Stain   Final    ABUNDANT WBC PRESENT,BOTH PMN AND MONONUCLEAR NO ORGANISMS SEEN    Culture NO GROWTH < 24 HOURS  Final   Report Status PENDING  Incomplete  Acid Fast Smear (AFB)     Status: None   Collection Time: 08/25/16  2:00 PM  Result Value Ref Range Status   AFB Specimen Processing Concentration  Final   Acid Fast Smear Negative  Final    Comment: (NOTE) Performed  At: [facility] [place], [place] [id number] Lindon Romp MD [id number]    Source (AFB) BRONCHIAL ALVEOLAR LAVAGE  Final    Studies/Results: No results found.    Assessment/Plan:  INTERVAL HISTORY:   One AFB sputa is negative x 2,  BAL negative AFB, QF negative Active Problems:   Hemoptysis   Weight loss   Night sweats   OSA (obstructive sleep apnea)   Morbid obesity (HCC)   Lung nodules    Phuong Moffatt is a 61 y.o. male with  61 y.o. 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 He has a 40 pound weight loss in his history as well though this was intentional through carbohydrate restriction. He has been found to have nodules on CT scan of the lungs.  #1 Lung nodules AFB negative x 3 and QF negative Dc airborne  Repeat CT scan in next 4-6 weeks  Patient OK to dc from ID standpoint.     LOS: 1 day   Alcide Evener 08/26/2016, 7:53 PM

## 2016-08-26 NOTE — Progress Notes (Signed)
Name: [name] [name] MRN: [id number] DOB: [birth year]    ADMISSION DATE:  08/23/2016 CONSULTATION DATE:  08/24/2016  REFERRING MD :  Dr. Beryle Beams   CHIEF COMPLAINT:  Hemoptysis   HISTORY OF PRESENT ILLNESS 61 year old male past smoker with PMH of 2 DM, CAD s/p PCI and angioplasty one week ago (has been on anticoagulation), and chronic cough. Presents to ED on 4/1 with chronic cough in which has worsened over the last 3 months. Patient states that Saturday he noticed he was coughing up dark red blood and then continued to have 3 more episodes in the last 48 hours. Along with the hemoptysis he was experiencing hoarseness, night sweats, and sinus congestion. Patient works in [employer] and travels frequently and is around [place] workers from [employer]. States he has had intentional weight loss over the last few months, unsure of amount. PCCM asked to consult.   SIGNIFICANT EVENTS  4/1 > Presents to ED with cough/hemoptysis  4/3 FOB per Dr. Lamonte Sakai  STUDIES:  CXR 4/1 > Mildly enlarged cardiac silhouette, mildly prominent pulmonary vasculature and interstitial markings, flattening of the hemidiaphragms on the lateral view, mild thoracic spine degenerative changes  CTA Chest 4/1 > Numerous patchy pulmonary nodules throughout both lungs, most of which demonstrate ill-defined margins with ground-glass halos, generally centrilobular in distribution. Nonspecific mediastinal and right hilar lymphadenopathy, aortic atherosclerosis, ectatic ascending thoracic aorta, maximum diameter 4.0 cm, dilated main pulmonary artery, suggesting pulmonary arterial hypertension, mild cardiomegaly, three-vessel coronary atherosclerosis, mild emphysema with diffuse bronchial wall thickening, suggesting COPD.   MICROBIOLOGY: Acid Fast 4/1 >> Quantiferon assay 4/1 >> HIV 4/1 >> Sputum Culture 4/1 >> RVP 4/1 >>neg    SUBJECTIVE:  Decreased hemoptysis VITAL SIGNS: Temp:  [98.2 F (36.8  C)-98.6 F (37 C)] 98.2 F (36.8 C) (04/03 1951) Pulse Rate:  [73-98] 98 (04/03 1951) Resp:  [14-21] 19 (04/03 1951) BP: (109-152)/(49-111) 130/63 (04/03 1951) SpO2:  [88 %-98 %] 93 % (04/03 1951) Weight:  [109.3 kg (241 lb)] 109.3 kg (241 lb) (04/03 1305)  PHYSICAL EXAMINATION: General:  Adult male, no distress , hemoptysis decreased post fob Neuro:  Alert, oriented, grossly intact HEENT:  Normocephalic  Cardiovascular:  RRR, no MRG, NI S1/S2 Lungs:  Coarse breath sounds, mild exp wheeze Abdomen:  Obese, non-tender, active bowel sounds  Musculoskeletal:  No acute  Skin:  Warm, dry, intact    Recent Labs Lab 08/23/16 1345 08/24/16 0054 08/24/16 1216  NA 133* 132* 134*  K 4.1 3.7 3.9  CL 100* 99* 101  CO2 26 23 26   BUN 9 13 9   CREATININE 0.57* 0.65 0.62  GLUCOSE 261* 222* 227*    Recent Labs Lab 08/23/16 1345 08/24/16 0054 08/25/16 0904  HGB 13.2 12.2* 12.8*  HCT 38.8* 35.2* 37.8*  WBC 8.8 9.3 10.2  PLT 281 244 297   No results found.  ASSESSMENT / PLAN:  Hemoptysis in setting of malignancy vs tuberculosis vs atypical fungal/viral infection with recent anticoagulation  H/O Tobacco Abuse Patient has had a Chronic cough for the last 3 months that has now progressed on Saturday to hemoptysis. Quiet smoking four+ years ago. Travels frequently. Has internationally traveled. Works with [employer]. Afebrile. No Leokocytosis. Procal Negative.   -High likelihood TB given history and presenting symptoms  -Patient states that he has to leave today to go to [place] for work  Plan -Maintain Oxygen >92 -Follow Culture Data  -Follow Acid-Fast Data and Quantiferon assay  -Send ANCA Titers  -  Maintain Hemoglobin >7 -4/3 post fob per MD. No lesion, ? DAH. MD to follow up 4/4.   Richardson Landry Tnya Ades ACNP Maryanna Shape PCCM Pager [phone] till 3 pm If no answer page [phone] 08/26/2016, 11:01 AM

## 2016-08-26 NOTE — Consult Note (Signed)
The patient has been seen in conjunction with Delos Haring, PA-C. All aspects of care have been considered and discussed. The patient has been personally interviewed, examined, and all clinical data has been reviewed.   History of CAD with prior coronary stent 7 years ago and recent angioplasty. Sites of intervention are unknown. Attempting to obtain information from the patient's local cardiologist in [place], Dr. Jerl Santos  Cough with hemoptysis. Etiology is unsure. Being aggravated by dual antiplatelet therapy.  Agree with pausing dual antiplatelet therapy for the time being. Once we receive information from his cardiologist concerning prior procedures, it may be appropriate to use only aspirin 81 mg per day or aspirin and Plavix which would have a lower risk of precipitating bleeding.  We will follow with you, and update the chart as soon as we have information concerning specifics.  CARDIOLOGY CONSULT NOTE   Patient ID: [name] [name] MRN: [id number] DOB/AGE: 1955-11-04 62 y.o.  Admit date: 08/23/2016  Requesting Physician: [name] is a 61 y.o. male who is being seen today for the evaluation of hemoptysis at the request of .Dr. Murriel Hopper Primary Physician:   Primary Cardiologist:    Dr. Marzetta Merino Heart Specialist [phone]  Reason for Consultation:   Hemoptysis on Effient and Aspirin s/p PCI w/angioplasty ~1 week ago [name] [name] is an 61 y.o. male with past history significant for coronary artery disease; he reports having a recent cardiac catheterization 9 days ago with PCI and angioplasty in [place], [place] where he is from. He had a cath 7 years ago and received a stent to a "posterior artery". The most recent cath showed a patent stent and a blockage, per patient verbal report, requiring angioplasty to his "left artery". He has been on aspirin for the past 7 years at 324 mg aspirin and last week started on Effient.                  He also has PMH of  untreated obstructive sleep apnea, morbid obesity, 50-pack-year history of smoking having quit 4 years prior who presented to the emergency department with blood-tinged sputum. He had developed a cough a proximally 3 months ago that worsened in the last week after he developed a sore throat.  He has been admitted to the Internal Medicine Residents service for evaluation of hemoptysis in setting of malignancy vs tuberculosis vs atypical fungal/viral infection with recent anticoagulation. He has been taken off of his aspirin and effient in the setting of bleeding and cardiology has been consulted for f/u of recent cath and recommendations for medication.  I have called his cardiologist office, Dr. Jerl Santos to request for records, being sent to [facility] office by fax and speak with his cardiologist. I have not received any documents thus far. He is irritated by his circumstance and having to be in the hospital.He has not had any  CP, SOB or le swelling.  Bronchoscopy performed yesterday which showed blood coming from all airways but appeared to be primarily from LLL, no endobronchial lesions to explain bleeding. Consider possible diffuse alveolar hemorrhage, vasculitis, discrete bleeding lesion that was too distal to be seen bronchoscopically.                   Past Medical History:  Diagnosis Date  . Collagen vascular disease ([place])   . Coronary artery disease   . Diabetes mellitus without complication ([place])   . Hypertension      Past Surgical History:  Procedure Laterality Date  . CARDIAC CATHETERIZATION  2011   s/p PCI  . CORONARY ANGIOPLASTY  2018    No Known Allergies  I have reviewed the patient's current medications . [START ON 08/27/2016] azithromycin  250 mg Oral Daily  . carvedilol  12.5 mg Oral Q lunch  . insulin aspart  0-15 Units Subcutaneous TID WC  . insulin aspart  0-5 Units Subcutaneous QHS  . lisinopril  10 mg Oral Daily  . rosuvastatin  10 mg Oral QHS     acetaminophen **OR** acetaminophen, chlorpheniramine-HYDROcodone, senna-docusate  Prior to Admission medications   Medication Sig Start Date End Date Taking? Authorizing Provider  aspirin 325 MG tablet Take 325 mg by mouth daily.   Yes Historical Provider, MD  aspirin-sod bicarb-citric acid (ALKA-SELTZER) 325 MG TBEF tablet Take 325 mg by mouth every 6 (six) hours as needed (cold symptoms).   Yes Historical Provider, MD  carvedilol (COREG) 12.5 MG tablet Take 12.5 mg by mouth daily.   Yes Historical Provider, MD  dextromethorphan (ROBITUSSIN 12 HOUR COUGH) 30 MG/5ML liquid Take 10 mLs by mouth at bedtime as needed for cough.   Yes Historical Provider, MD  lisinopril (PRINIVIL,ZESTRIL) 10 MG tablet Take 10 mg by mouth daily.   Yes Historical Provider, MD  metFORMIN (GLUCOPHAGE) 500 MG tablet Take 500 mg by mouth 2 (two) times daily with a meal.   Yes Historical Provider, MD  Oral Electrolytes (EMERGEN-C ELECTRO MIX) PACK Take 1 packet by mouth daily as needed (supplement).   Yes Historical Provider, MD  phenol (CHLORASEPTIC) 1.4 % LIQD Use as directed 1 spray in the mouth or throat as needed for throat irritation / pain.   Yes Historical Provider, MD  prasugrel (EFFIENT) 10 MG TABS tablet Take 10 mg by mouth daily.   Yes Historical Provider, MD  rosuvastatin (CRESTOR) 10 MG tablet Take 10 mg by mouth at bedtime.   Yes Historical Provider, MD     Social History   Social History  . Marital status: Single    Spouse name: N/A  . Number of children: N/A  . Years of education: N/A   Occupational History  . Not on file.   Social History Main Topics  . Smoking status: Former Smoker    Types: Cigarettes    Quit date: 2014  . Smokeless tobacco: Never Used  . Alcohol use Not on file  . Drug use: Unknown  . Sexual activity: Not on file   Other Topics Concern  . Not on file   Social History Narrative  . No narrative on file    No family status information on file.   History  reviewed. No pertinent family history.       ROS:  Full 14 point review of systems complete and found to be negative unless listed above.  Physical Exam: Blood pressure 130/63, pulse 98, temperature 98.2 F (36.8 C), temperature source Oral, resp. rate 19, height 5\' 10"  (1.778 m), weight 241 lb (109.3 kg), SpO2 93 %.  General: Well developed, well nourished, male in no acute distress, Obese Head: No xanthomas. Normocephalic and atraumatic, Lungs: normal effort and rate of breathing. Heart:: normal rate and rhythm  Neck: No carotid bruits. No lymphadenopathy. Abdomen: Bowel sounds present, abdomen soft and non-tender Msk:  Spontaneously moves all 4 extremities Extremities: No clubbing or cyanosis.  No le edema  Neuro: Alert and oriented X 3. No focal deficits noted. Psych:  Good affect, responds appropriately Skin: No rashes or lesions noted.  Labs:  Lab Results  Component Value Date   WBC 9.2 08/26/2016   HGB 12.4 (L) 08/26/2016   HCT 36.8 (L) 08/26/2016   MCV 84.4 08/26/2016   PLT 331 08/26/2016    Recent Labs  08/26/16 1007  INR 1.01    Recent Labs Lab 08/24/16 1216  NA 134*  K 3.9  CL 101  CO2 26  BUN 9  CREATININE 0.62  CALCIUM 9.7  PROT 7.1  BILITOT 0.8  ALKPHOS 63  ALT 29  AST 21  GLUCOSE 227*  ALBUMIN 3.3*    Echo   None  ECG:     Sinus rhythm, HR 75, PVC's with right bundle branch block   Radiology:No results found.   CT angio chest with contrast IMPRESSION: 1. No evidence of pulmonary embolism on this motion degraded scan. 2. Numerous patchy pulmonary nodules throughout both lungs, most of which demonstrate ill-defined margins with ground-glass halos, generally centrilobular in distribution. These findings are most suggestive of an infectious etiology such as due to bronchopneumonia or atypical pneumonia such as fungal pneumonia. Pulmonary metastatic disease is considered less likely but not entirely excluded and a follow-up  post treatment chest CT is recommended. 3. Nonspecific mediastinal and right hilar lymphadenopathy, which should also be followed up on a posttreatment chest CT with IV contrast in 2-3 months. 4. Aortic atherosclerosis. Ectatic ascending thoracic aorta, maximum diameter 4.0 cm. Recommend annual imaging followup by CTA or MRA. This recommendation follows 2010 ACCF/AHA/AATS/ACR/ASA/SCA/SCAI/SIR/STS/SVM Guidelines for the Diagnosis and Management of Patients with Thoracic Aortic Disease. Circulation. 2010; 121: Q333-L456. 5. Dilated main pulmonary artery, suggesting pulmonary arterial hypertension. 6. Mild cardiomegaly . 7. Three-vessel coronary atherosclerosis. 8. Mild emphysema with diffuse bronchial wall thickening, suggesting COPD.   ASSESSMENT AND PLAN:      Active Problems:   Hemoptysis   Weight loss   Night sweats   OSA (obstructive sleep apnea)   Morbid obesity (HCC)   Lung nodules  1. Coronary artery disease of the native coronary system with prior history of stent and angioplasty with unknown sites of intervention.  Recent cardiac catheterization PCI with angioplasty: Office said they would fax over cath report. Hesitant to give results over the phone. Dr. Jerl Santos will not be back in office until after 2 pm. He has hx of 1 stent 7 years ago and recent PCI/angioplasty for 99% stenosis to a left side vessel. He reports being told everything else was fine.  --Currently Effient and Aspirin being withheld until source of bleeding identified, waiting to touch base with his cardiologist to update him on patient current situation. --Monitor I/O's, daily weights.  2. Hemoptysis: Being worked up by primary team. Bronchoscopy performed yesterday which showed blood coming from all airways but appeared to be primarily from LLL, no endobronchial lesions to explain bleeding. Consider possible diffuse alveolar hemorrhage, vasculitis, discrete bleeding lesion that was too distal to be seen  bronchoscopically.  3. Diabetes: being tried by primary team  4. Hyperlipidemia: cont Crestor.  5. Hypertension: On Lisinopril 10 mg daily, BP controlled on this dose. Cont.  Patient with with Hemoptysis, cause currently unknown but thought to be possibly from diffuse alveolar hemorrhage, vasculitis, or discrete bleeding lesion that was too distal to be seen bronchoscopically. At this time will need to continue to hold off on Effient and Aspirin.  SignedLinus Mako, PA-C 08/26/2016 12:09 PM

## 2016-08-27 ENCOUNTER — Encounter (HOSPITAL_COMMUNITY): Payer: Self-pay | Admitting: Emergency Medicine

## 2016-08-28 LAB — CULTURE, BAL-QUANTITATIVE: CULTURE: NORMAL — AB

## 2016-08-28 LAB — CULTURE, BAL-QUANTITATIVE W GRAM STAIN: Special Requests: NORMAL

## 2016-09-01 LAB — MISC LABCORP TEST (SEND OUT)
LABCORP TEST CODE: 224241
Labcorp test code: 914280

## 2016-09-11 ENCOUNTER — Encounter: Payer: Self-pay | Admitting: Internal Medicine

## 2016-09-11 ENCOUNTER — Ambulatory Visit (INDEPENDENT_AMBULATORY_CARE_PROVIDER_SITE_OTHER): Payer: 59 | Admitting: Internal Medicine

## 2016-09-11 VITALS — BP 149/78 | HR 71 | Temp 98.0°F | Ht 69.5 in | Wt 298.9 lb

## 2016-09-11 DIAGNOSIS — Z87891 Personal history of nicotine dependence: Secondary | ICD-10-CM

## 2016-09-11 DIAGNOSIS — R05 Cough: Secondary | ICD-10-CM | POA: Diagnosis not present

## 2016-09-11 DIAGNOSIS — Z9989 Dependence on other enabling machines and devices: Secondary | ICD-10-CM | POA: Diagnosis not present

## 2016-09-11 DIAGNOSIS — Z7902 Long term (current) use of antithrombotics/antiplatelets: Secondary | ICD-10-CM

## 2016-09-11 DIAGNOSIS — Z7982 Long term (current) use of aspirin: Secondary | ICD-10-CM

## 2016-09-11 DIAGNOSIS — Z79899 Other long term (current) drug therapy: Secondary | ICD-10-CM | POA: Diagnosis not present

## 2016-09-11 DIAGNOSIS — E119 Type 2 diabetes mellitus without complications: Secondary | ICD-10-CM | POA: Diagnosis not present

## 2016-09-11 DIAGNOSIS — G4733 Obstructive sleep apnea (adult) (pediatric): Secondary | ICD-10-CM | POA: Diagnosis not present

## 2016-09-11 DIAGNOSIS — Z5189 Encounter for other specified aftercare: Secondary | ICD-10-CM

## 2016-09-11 DIAGNOSIS — I10 Essential (primary) hypertension: Secondary | ICD-10-CM | POA: Insufficient documentation

## 2016-09-11 DIAGNOSIS — I2510 Atherosclerotic heart disease of native coronary artery without angina pectoris: Secondary | ICD-10-CM

## 2016-09-11 DIAGNOSIS — R918 Other nonspecific abnormal finding of lung field: Secondary | ICD-10-CM | POA: Diagnosis not present

## 2016-09-11 LAB — POCT GLYCOSYLATED HEMOGLOBIN (HGB A1C): Hemoglobin A1C: 8.2

## 2016-09-11 LAB — GLUCOSE, CAPILLARY: Glucose-Capillary: 252 mg/dL — ABNORMAL HIGH (ref 65–99)

## 2016-09-11 MED ORDER — CLOPIDOGREL BISULFATE 75 MG PO TABS: 75.0000 mg | ORAL_TABLET | Freq: Every day | ORAL | 0 refills | Status: DC

## 2016-09-11 MED ORDER — ASPIRIN EC 81 MG PO TBEC: 81.0000 mg | DELAYED_RELEASE_TABLET | Freq: Every day | ORAL | 2 refills | Status: DC

## 2016-09-11 NOTE — Progress Notes (Signed)
CC: HFU for hempotysis  HPI:  [name] is a 61 y.o. male with a past medical history listed below here today for follow up of his recent hospitalization for hemoptysis.   For details of today's visit and the status of his chronic medical issues please refer to the assessment and plan.  [name] was recently admitted on the IMTS from 4/1-4/4 with complaints of hemoptysis. 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 He presented with approximately 3 month history of cough which acutely worsened in the week prior to admission when he also developed pharyngiits. About 3 days prior to admission he began to cough up intermittent bloody sputum. Had complaints of intermittent profuse night sweats and questionable weight loss. CXR done in the ED was unremarkable. CT angio to rule out PE showed bilateral atypical pulmonary nodular opacities thought to be more consistnet with infection rather than maligicany. Incidetnally noted to have 3 vessel coronary atherosclerosis, mild cardiolgmegaly and early changes of emphesma. Ectatic ascending aorta with diameter of 4 cm.   Patinet underwent work up to rule out atypical infection given his symptoms and recent travel. He had two acid fast smears which were negative. BAL was negative (3 acid fast smears negative) and quantiferon was negaive. Underwent bronchoscopy which noted bloody secretions int he left lung base. No ulceration or mass noted. No obvious source of bleeding noted. Work up for vasculitis was negative with C-ANCA, P-ANCA, myeloperoxidase Ab were all negative. Histoplasma Ag, Cryptococcal Ag and blastomyces Ag were all negative.   Today, he reports his coughing is improved but not resolved. No further blood. Scant sputum production. No shortness of breath. No chest pain.   Past Medical History:  Diagnosis Date  . Collagen vascular disease ([place])   . Coronary  artery disease   . Diabetes mellitus without complication ([place])   . Hypertension    Review of Systems:   Review of Systems  Constitutional: Negative.   HENT: Negative.   Eyes: Negative.   Respiratory: Positive for cough and sputum production. Negative for hemoptysis, shortness of breath and wheezing.   Cardiovascular: Negative.   Gastrointestinal: Negative.   Genitourinary: Negative.   Musculoskeletal: Negative.   Skin: Negative.   Neurological: Negative.   Endo/Heme/Allergies: Negative.   Psychiatric/Behavioral: Negative.    Physical Exam:  Vitals:   09/11/16 1321  BP: (!) 149/78  Pulse: 71  Temp: 98 F (36.7 C)  TempSrc: Oral  SpO2: 98%  Weight: 298 lb 14.4 oz (135.6 kg)  Height: 5' 9.5" (1.765 m)   General: alert, well-developed, and cooperative to examination.  Head: normocephalic and atraumatic.  Eyes: vision grossly intact, pupils equal, pupils round, pupils reactive to light, no injection and anicteric.  Mouth: pharynx pink and moist, no erythema, and no exudates.  Neck: supple, full ROM, no thyromegaly, no JVD, and no carotid bruits.  Lungs: normal respiratory effort, no accessory muscle use, normal breath sounds, no crackles, and no wheezes. Heart: normal rate, regular rhythm, no murmur, no gallop, and no rub.  Abdomen: soft, non-tender, normal bowel sounds, no distention, no guarding, no rebound tenderness, no hepatomegaly, and no splenomegaly.  Msk: no joint swelling, no joint warmth, and no redness over joints.  Pulses: 2+ DP/PT pulses bilaterally Extremities: No cyanosis, clubbing, edema Neurologic: alert & oriented X3, no focal deficits Skin: turgor normal and no rashes.    Assessment & Plan:   See Encounters Tab  for problem based charting.  Patient discussed with Dr. Beryle Beams

## 2016-09-11 NOTE — Patient Instructions (Signed)
[name] [name],   It was a pleasure meeting you today.  Please increase the metformin dose to 1000 mg twice a day.  We will arrange for another CT scan of your chest in about a month.  Follow up in clinic in about 6 weeks.

## 2016-09-16 DIAGNOSIS — I2510 Atherosclerotic heart disease of native coronary artery without angina pectoris: Secondary | ICD-10-CM | POA: Insufficient documentation

## 2016-09-16 NOTE — Assessment & Plan Note (Addendum)
Reports history of OSA. Uses CPAP qhs.   Instructed to clean his CPAP after use. Continue CPAP qhs.

## 2016-09-16 NOTE — Assessment & Plan Note (Addendum)
No further hemoptysis. Cough is improving.   Will repeat CT chest in about 4 weeks and follow up in [facility] to discuss the results.

## 2016-09-16 NOTE — Assessment & Plan Note (Signed)
Currently on ASA, Plavix, Crestor 10 mg daily.   Continue current medications.

## 2016-09-16 NOTE — Assessment & Plan Note (Signed)
BP Readings from Last 3 Encounters:  09/11/16 (!) 149/78  08/26/16 134/80    Lab Results  Component Value Date   NA 134 (L) 08/24/2016   K 3.9 08/24/2016   CREATININE 0.62 08/24/2016   BP 149/78 today. Currently on coreg 12.5 mg daily and lisinopril 10 mg daily. BP stable during recent hospitalization. Denies any dizziness/lightheadedness, vision changes, headaches.   Assessment: HTN  Plan: Continue current medications Consider increase in lisinopril if remains elevated at future visits

## 2016-09-16 NOTE — Assessment & Plan Note (Signed)
Lab Results  Component Value Date   HGBA1C 8.2 09/11/2016    Reports being recently diagnosed with DM by his cardiologist in [place]. He was started on Metformin 500 mg bid. Reports he as been tolerating this well with no adverse side effects.  A1c today is 8.2. Denies any episodes of hypoglycemia.   Assessment: DM type II  Plan: Increase metformin to 1000 mg bid Follow up in about a month

## 2016-09-16 NOTE — Assessment & Plan Note (Deleted)
No further hemoptysis. Cough is improving.   Will repeat CT chest in about 4 weeks.

## 2016-09-16 NOTE — Progress Notes (Signed)
Medicine attending: Medical history, presenting problems, physical findings, and medications, reviewed with resident physician Dr Maryellen Pile on the day of the patient visit and I concur with her evaluation and management plan.

## 2016-09-23 LAB — FUNGUS CULTURE WITH STAIN

## 2016-09-23 LAB — FUNGAL ORGANISM REFLEX

## 2016-09-23 LAB — FUNGUS CULTURE RESULT

## 2016-09-29 ENCOUNTER — Ambulatory Visit (HOSPITAL_COMMUNITY): Payer: 59

## 2016-10-03 ENCOUNTER — Encounter: Payer: Self-pay | Admitting: Internal Medicine

## 2016-10-06 LAB — ACID FAST CULTURE WITH REFLEXED SENSITIVITIES (MYCOBACTERIA)

## 2016-10-06 LAB — ACID FAST CULTURE WITH REFLEXED SENSITIVITIES: ACID FAST CULTURE - AFSCU3: NEGATIVE

## 2016-10-07 LAB — ACID FAST CULTURE WITH REFLEXED SENSITIVITIES (MYCOBACTERIA): Acid Fast Culture: NEGATIVE

## 2016-10-08 LAB — MTB NAA WITHOUT AFB CULTURE

## 2016-10-09 ENCOUNTER — Ambulatory Visit (HOSPITAL_COMMUNITY): Payer: 59

## 2016-10-09 MED ORDER — METFORMIN HCL 500 MG PO TABS: 500.0000 mg | ORAL_TABLET | Freq: Two times a day (BID) | ORAL | 1 refills | Status: DC

## 2016-10-15 LAB — ACID FAST CULTURE WITH REFLEXED SENSITIVITIES (MYCOBACTERIA): Acid Fast Culture: NEGATIVE

## 2016-10-23 ENCOUNTER — Ambulatory Visit (INDEPENDENT_AMBULATORY_CARE_PROVIDER_SITE_OTHER): Payer: 59 | Admitting: Internal Medicine

## 2016-10-23 ENCOUNTER — Ambulatory Visit (HOSPITAL_COMMUNITY): Payer: 59

## 2016-10-23 VITALS — BP 138/60 | HR 77 | Temp 98.4°F | Ht 70.0 in | Wt 296.6 lb

## 2016-10-23 DIAGNOSIS — R918 Other nonspecific abnormal finding of lung field: Secondary | ICD-10-CM

## 2016-10-23 DIAGNOSIS — I2510 Atherosclerotic heart disease of native coronary artery without angina pectoris: Secondary | ICD-10-CM

## 2016-10-23 DIAGNOSIS — E119 Type 2 diabetes mellitus without complications: Secondary | ICD-10-CM | POA: Diagnosis not present

## 2016-10-23 DIAGNOSIS — I10 Essential (primary) hypertension: Secondary | ICD-10-CM

## 2016-10-23 DIAGNOSIS — G4733 Obstructive sleep apnea (adult) (pediatric): Secondary | ICD-10-CM | POA: Diagnosis not present

## 2016-10-23 DIAGNOSIS — Z79899 Other long term (current) drug therapy: Secondary | ICD-10-CM

## 2016-10-23 DIAGNOSIS — R911 Solitary pulmonary nodule: Secondary | ICD-10-CM

## 2016-10-23 DIAGNOSIS — Z9989 Dependence on other enabling machines and devices: Secondary | ICD-10-CM

## 2016-10-23 DIAGNOSIS — Z7984 Long term (current) use of oral hypoglycemic drugs: Secondary | ICD-10-CM | POA: Diagnosis not present

## 2016-10-23 DIAGNOSIS — Z87891 Personal history of nicotine dependence: Secondary | ICD-10-CM | POA: Diagnosis not present

## 2016-10-23 DIAGNOSIS — R042 Hemoptysis: Secondary | ICD-10-CM | POA: Diagnosis not present

## 2016-10-23 MED ORDER — HYDROCOD POLST-CPM POLST ER 10-8 MG/5ML PO SUER: 5.0000 mL | Freq: Two times a day (BID) | ORAL | 0 refills | Status: DC | PRN

## 2016-10-23 NOTE — Progress Notes (Signed)
Internal Medicine Clinic Attending  Case discussed with Dr. Amin at the time of the visit.  We reviewed the resident's history and exam and pertinent patient test results.  I agree with the assessment, diagnosis, and plan of care documented in the resident's note.    

## 2016-10-23 NOTE — Assessment & Plan Note (Signed)
Last A1c done in 08/30/2016 was 8.5.  Patient is on metformin 500 mg twice daily.  Continue current dose of metformin-we will repeat A1c during next follow-up visit and increase the dose of metformin if needed.

## 2016-10-23 NOTE — Assessment & Plan Note (Signed)
He was asked to repeat CT chest after 1 month because of his persistent symptoms. Apparently there are some insurance issues and he cannot repeat CT chest before 3 month which will be July 1. We're trying to expedite  that process.   We will try peer to peer Review. Referral to pulmonology was given. He needs to rule out any malignancy or other very rare causes of persistent hemoptysis.

## 2016-10-23 NOTE — Patient Instructions (Signed)
Thank you for visiting clinic today. We are giving you a referral to see a lung doctor. We will try of her best to expedite his your CT scan. I'm giving you a prescription for cough medicine so you can take it while waiting for further evaluation.

## 2016-10-23 NOTE — Assessment & Plan Note (Addendum)
Patient was having intermittent hemoptysis with persistent cough since his previous hospitalization in April 2018. He was given Tussionex for his cough which helped, as he was ran out of that his cough was getting worse.  His previous CBC done shows mild eosinophilia. He needs further investigation of his ongoing cough and hemoptysis, All previous extensive workup was negative for any infectious or immunologic cause.  Addendum. Patient was found to have persistent mild eosinophilia. I called the patient to obtain more detailed travel history, there was a genetic counseling service, so never left a message. Patient needs to be investigated for any possible parasite infections.   -Repeat CBC. -BMP-as he was mildly hyponatremic on previous testing. -Referred to pulmonary for further evaluation and management. -Prescription for Tussionex was given.

## 2016-10-23 NOTE — Assessment & Plan Note (Signed)
He is compliant with his use of CPAP at night.  Continue CPAP at night.

## 2016-10-23 NOTE — Progress Notes (Signed)
   CC: Cough with hemoptysis.  HPI:  [name] [name] is a 61 y.o. with past medical history as listed below came to the clinic after having more hemoptysis yesterday.  Patient was recently admitted in the beginning of April due to hemoptysis, found to have bilateral multiple pulmonary nodules on CT chest,extensive workup for any infectious cause or autoimmune etiology was negative. Bronchoscope was positive for blood at lung bases.  He was asked to repeat CT chest in one month because of continuation of his symptoms, apparently there are some insurance issues and he cannot repeat CT chest before 3 month which will be July 1. We're trying to expedite  that process.  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 According to patient he developed acute bronchitis 2-3 weeks ago, treated with Augmentin and albuterol inhaler at [place]. He took Augmentin for 5 days then stopped for a few days and then restart taking it once his symptoms started getting worse, he took it for 3 more days. According to patient albuterol makes his hemoptysis worse.  He continued to have intermittent hemoptysis and cough since his admission in April. He ran out of his cough medicine resulted in increase in his cough and worsening hemoptysis yesterday,According to patient he had approximately 6-8 oz of sputum mixed with blood yesterday. He denies any fever or chills, does endorse occasional night sweats, no chest pain or dyspnea.  He was also complaining of worsening hoarseness of his voice for the last 6 month, stating that in the evening he is unable to produce any voice which interfer with his work as his work requires a lot of talking. He denies any dysphagia or dryness or pain in his throat.  According to patient he quit smoking 3 years ago, used to smoke 1 pack  per day before. Denies any illicit drug use.  Past Medical History:  Diagnosis Date  . Collagen vascular disease ([place])   . Coronary artery disease   . Diabetes mellitus without complication ([place])   . Hypertension     Review of Systems:  As per HPI.  Physical Exam:  Vitals:   10/23/16 0929  BP: 138/60  Pulse: 77  Temp: 98.4 F (36.9 C)  TempSrc: Oral  SpO2: 100%  Weight: 296 lb 9.6 oz (134.5 kg)  Height: 5\' 10"  (1.778 m)   Vitals:   10/23/16 0929  BP: 138/60  Pulse: 77  Temp: 98.4 F (36.9 C)  TempSrc: Oral  SpO2: 100%  Weight: 296 lb 9.6 oz (134.5 kg)  Height: 5\' 10"  (1.778 m)   General: Vital signs reviewed.  Patient is well-developed and well-nourished, in no acute distress and cooperative with exam.  Head: Normocephalic and atraumatic. Eyes: EOMI, conjunctivae normal, no scleral icterus.  Neck: Supple, trachea midline, normal ROM, no JVD, masses, thyromegaly, or carotid bruit present.  no lymphadenopathy. Cardiovascular: RRR, S1 normal, S2 normal, no murmurs, gallops, or rubs. Pulmonary/Chest: Bilateral basal coarse breath sounds more pronounced at right base. Abdominal: Soft, non-tender, non-distended, BS +, no masses, organomegaly, or guarding present.  Extremities: No lower extremity edema bilaterally,  pulses symmetric and intact bilaterally. No cyanosis or clubbing.   Assessment & Plan:   See Encounters Tab for problem based charting.  Patient discussed with Dr. Lynnae January.

## 2016-10-23 NOTE — Assessment & Plan Note (Signed)
BP Readings from Last 3 Encounters:  10/23/16 138/60  09/11/16 (!) 149/78  08/26/16 134/80   Blood Pressures seems stable on Coreg 12.5 mg and lisinopril 10 mg daily.  Continue current management.

## 2016-10-23 NOTE — Assessment & Plan Note (Signed)
No current complaint of chest pain.  Continue aspirin, Plavix and Crestor.

## 2016-10-24 LAB — CBC WITH DIFFERENTIAL/PLATELET
Basophils Absolute: 0 10*3/uL (ref 0.0–0.2)
Basos: 1 %
EOS (ABSOLUTE): 0.8 10*3/uL — ABNORMAL HIGH (ref 0.0–0.4)
Eos: 11 %
Hematocrit: 39.7 % (ref 37.5–51.0)
Hemoglobin: 13.1 g/dL (ref 13.0–17.7)
Immature Grans (Abs): 0 10*3/uL (ref 0.0–0.1)
Immature Granulocytes: 0 %
Lymphocytes Absolute: 1.6 10*3/uL (ref 0.7–3.1)
Lymphs: 21 %
MCH: 28.6 pg (ref 26.6–33.0)
MCHC: 33 g/dL (ref 31.5–35.7)
MCV: 87 fL (ref 79–97)
Monocytes Absolute: 0.5 10*3/uL (ref 0.1–0.9)
Monocytes: 7 %
Neutrophils Absolute: 4.4 10*3/uL (ref 1.4–7.0)
Neutrophils: 60 %
Platelets: 282 10*3/uL (ref 150–379)
RBC: 4.58 x10E6/uL (ref 4.14–5.80)
RDW: 13.6 % (ref 12.3–15.4)
WBC: 7.3 10*3/uL (ref 3.4–10.8)

## 2016-10-24 LAB — BMP8+ANION GAP
Anion Gap: 17 mmol/L (ref 10.0–18.0)
BUN/Creatinine Ratio: 18 (ref 10–24)
BUN: 12 mg/dL (ref 8–27)
CALCIUM: 9.7 mg/dL (ref 8.6–10.2)
CO2: 23 mmol/L (ref 18–29)
Chloride: 93 mmol/L — ABNORMAL LOW (ref 96–106)
Creatinine, Ser: 0.68 mg/dL — ABNORMAL LOW (ref 0.76–1.27)
GFR calc non Af Amer: 104 mL/min/{1.73_m2} (ref >59)
GFR, EST AFRICAN AMERICAN: 120 mL/min/{1.73_m2} (ref >59)
Glucose: 295 mg/dL — ABNORMAL HIGH (ref 65–99)
Potassium: 4.9 mmol/L (ref 3.5–5.2)
Sodium: 133 mmol/L — ABNORMAL LOW (ref 134–144)

## 2016-10-26 NOTE — Telephone Encounter (Signed)
Do we know why this is getting cancelled? Thanks

## 2016-10-28 ENCOUNTER — Telehealth: Payer: Self-pay | Admitting: *Deleted

## 2016-10-28 ENCOUNTER — Telehealth: Payer: Self-pay | Admitting: Emergency Medicine

## 2016-10-28 NOTE — Telephone Encounter (Signed)
Call to [facility] to  See if patient can be seen earlier per Dr. Reesa Chew. Spoke to office was given appointment in Early July.  Patient needs more Urgent appointment.  Office to send note to Nurse to check on more Urgent availability for patient to be seen in office. Sander Nephew, RN 10/28/2016 11:26 AM.

## 2016-10-28 NOTE — Telephone Encounter (Signed)
Spoke with Regino Schultze and the patient. He saw Dr. Lamonte Sakai while in the hospital. He has a consult scheduled for August with him. The coughing up blood is what sent him to the hospital. Offered him an appt with Dr. Melvyn Novas tomorrow at [place] but patient states he is in [place] and moves around a lot of work. He will contact the office once he knows whether or not he can schedule a sooner appt.

## 2016-10-29 NOTE — Telephone Encounter (Signed)
Will call pt tomorrow to find out if he wants to schedule the appt sooner with RB or with someone else.  If not, then we can sign off message until the pt calls back for appt.

## 2016-10-30 ENCOUNTER — Encounter: Payer: Self-pay | Admitting: Internal Medicine

## 2016-10-30 NOTE — Telephone Encounter (Signed)
Attempted contact pt. Received a fast busy signal x2. Will try back. 

## 2016-10-30 NOTE — Telephone Encounter (Signed)
Pt called back, moved up appt to see MW on 7/10. Nothing further needed.

## 2016-10-30 NOTE — Telephone Encounter (Signed)
Patient returned call, CB is [phone]

## 2016-10-30 NOTE — Telephone Encounter (Signed)
Called and spoke with pt and he will call back as he was on the phone with work.  He will need sooner appt with RB than in August.

## 2016-12-01 ENCOUNTER — Ambulatory Visit (INDEPENDENT_AMBULATORY_CARE_PROVIDER_SITE_OTHER): Payer: 59 | Admitting: Internal Medicine

## 2016-12-01 ENCOUNTER — Encounter: Payer: Self-pay | Admitting: Internal Medicine

## 2016-12-01 VITALS — BP 124/64 | HR 87 | Ht 70.0 in | Wt 296.0 lb

## 2016-12-01 DIAGNOSIS — I10 Essential (primary) hypertension: Secondary | ICD-10-CM | POA: Diagnosis not present

## 2016-12-01 DIAGNOSIS — R042 Hemoptysis: Secondary | ICD-10-CM | POA: Diagnosis not present

## 2016-12-01 NOTE — Patient Instructions (Addendum)
I strongly recommend you come off the lisinopril and replace it with an ARB (Avapro would be my recommendation)     Please schedule a follow up office visit in 6 weeks, call sooner if needed with pfts and all your medications

## 2016-12-01 NOTE — Progress Notes (Signed)
Subjective:     Patient ID: [name] [name], male   DOB: May 22, 1956,   MRN: [id number]  HPI   89 yowm quit smoking  2014 with cough  And sinus congestion 100% resolved and maint on acei with chronic cough x decades then much worse than usual for about 4-5 days while on plavx/asa> admit with hemoptysis  Date of Admission: 08/23/2016 11:25 AM Date of Discharge: 08/26/2016 Attending Physician: Dr. Beryle Beams  Discharge Diagnosis: 1. Hemoptysis  Active Problems:   Hemoptysis   Weight loss   Night sweats   OSA (obstructive sleep apnea)   Morbid obesity (HCC)   Lung nodules   Discharge Medications: Allergies as of 08/26/2016   No Known Allergies        Medication List    STOP taking these medications   aspirin 325 MG tablet   aspirin-sod bicarb-citric acid 325 MG Tbef tablet Commonly known as:  ALKA-SELTZER   prasugrel 10 MG Tabs tablet Commonly known as:  EFFIENT   ROBITUSSIN 12 HOUR COUGH 30 MG/5ML liquid Generic drug:  dextromethorphan     TAKE these medications   carvedilol 12.5 MG tablet Commonly known as:  COREG Take 12.5 mg by mouth daily.   CHLORASEPTIC 1.4 % Liqd Generic drug:  phenol Use as directed 1 spray in the mouth or throat as needed for throat irritation / pain.   chlorpheniramine-HYDROcodone 10-8 MG/5ML Suer Commonly known as:  TUSSIONEX Take 5 mLs by mouth every 12 (twelve) hours as needed for cough.   EMERGEN-C ELECTRO MIX Pack Take 1 packet by mouth daily as needed (supplement).   lisinopril 10 MG tablet Commonly known as:  PRINIVIL,ZESTRIL Take 10 mg by mouth daily.   metFORMIN 500 MG tablet Commonly known as:  GLUCOPHAGE Take 500 mg by mouth 2 (two) times daily with a meal.   rosuvastatin 10 MG tablet Commonly known as:  CRESTOR Take 10 mg by mouth at bedtime.       12/01/2016 1st [facility] office visit/ Katalyna Socarras   Chief Complaint  Patient presents with  . pulmonary consult    Pt states he was in the  hospital due to hemoptysis, Pt is concerned about hoarseness Pt states he not currenlty coughing up blood, Denies sob,wheezing, Patient states he had a bronch done and was told there was some blood in his lung   He is back to baseline chronic dry cough "always" per wife = maybe a decade, worse since first of year. On his way to airport now and no limiting sob or cp or recurrent cough  No obvious day to day or daytime variability or assoc excess/ purulent sputum or mucus plugs or  chest tightness, subjective wheeze or overt sinus or hb symptoms. No unusual exp hx or h/o childhood pna/ asthma or knowledge of premature birth.  Sleeping ok without nocturnal  or early am exacerbation  of respiratory  c/o's or need for noct saba. Also denies any obvious fluctuation of symptoms with weather or environmental changes or other aggravating or alleviating factors except as outlined above   Current Medications, Allergies, Complete Past Medical History, Past Surgical History, Family History, and Social History were reviewed in [employer] record.  ROS  The following are not active complaints unless bolded sore throat, dysphagia, dental problems, itching, sneezing,  nasal congestion or excess/ purulent secretions, ear ache,   fever, chills, sweats, unintended wt loss, classically pleuritic or exertional cp,  orthopnea pnd or leg swelling, presyncope, palpitations, abdominal pain, anorexia,  nausea, vomiting, diarrhea  or change in bowel or bladder habits, change in stools or urine, dysuria,hematuria,  rash, arthralgias, visual complaints, headache, numbness, weakness or ataxia or problems with walking or coordination,  change in mood/affect or memory.          Review of Systems     Objective:   Physical Exam    Obese quite hoarse amb wm with upper airway pattern cough    Wt Readings from Last 3 Encounters:  12/01/16 296 lb (134.3 kg)  10/23/16 296 lb 9.6 oz (134.5 kg)  09/11/16  298 lb 14.4 oz (135.6 kg)    Vital signs reviewed - Note on arrival 02 sats  97% on RA    HEENT: nl dentition, turbinates bilaterally, and oropharynx. Nl external ear canals without cough reflex   NECK :  without JVD/Nodes/TM/ nl carotid upstrokes bilaterally   LUNGS: no acc muscle use,  Nl contour chest which is clear to A and P bilaterally without cough on insp or exp maneuvers   CV:  RRR  no s3 or murmur or increase in P2, and no edema   ABD:  soft and nontender with nl inspiratory excursion in the supine position. No bruits or organomegaly appreciated, bowel sounds nl  MS:  Nl gait/ ext warm without deformities, calf tenderness, cyanosis or clubbing No obvious joint restrictions   SKIN: warm and dry without lesions    NEURO:  alert, approp, nl sensorium with  no motor or cerebellar deficits apparent.     I personally reviewed images and agree with radiology impression as follows:   Chest CTa 08/23/16 No evidence of pulmonary embolism on this motion degraded scan. 2. Numerous patchy pulmonary nodules throughout both lungs, most of which demonstrate ill-defined margins with ground-glass halos, generally centrilobular in distribution. These findings are most suggestive of an infectious etiology such as due to bronchopneumonia or atypical pneumonia such as fungal pneumonia. Pulmonary metastatic disease is considered less likely but not entirely excluded and a follow-up post treatment chest CT is recommended. 3. Nonspecific mediastinal and right hilar lymphadenopathy, which should also be followed up on a posttreatment chest CT with IV contrast in 2-3 months          Labs ordered/ reviewed:      Chemistry      Component Value Date/Time   NA 133 (L) 10/23/2016 1023   K 4.9 10/23/2016 1023   CL 93 (L) 10/23/2016 1023   CO2 23 10/23/2016 1023   BUN 12 10/23/2016 1023   CREATININE 0.68 (L) 10/23/2016 1023      Component Value Date/Time   CALCIUM 9.7 10/23/2016 1023    ALKPHOS 63 08/24/2016 1216   AST 21 08/24/2016 1216   ALT 29 08/24/2016 1216   BILITOT 0.8 08/24/2016 1216        Lab Results  Component Value Date   WBC 7.3 10/23/2016   HGB 13.1 10/23/2016   HCT 39.7 10/23/2016   MCV 87 10/23/2016   PLT 282 10/23/2016            Assessment:

## 2016-12-02 NOTE — Assessment & Plan Note (Signed)
Assoc with abn ct chest  08/23/16 with ? Inflammatory nodules, mild adenopathy  - FOB 08/25/16 no def source / neg afb and fungal and neg ANCA/ quant gold TB - Rec off acei and f/u with pfts/ CT chest 12/01/2016   Denies recurrent hemoptysis and no sob with nl exam on his way to the airport so did not have time to do any further studies today but rec he def be off ACEi (see separate a/p) so as to not muddy the waters here as the cough has all the features of acei causation including the chronic hoarseness / dysphagia and sore throat.   When returns will schedule f/u CT chest and pfts to complete the w/u but strongly doubt WG or active infection/ met lung at this point and suspect airway trauma from the chronic cough is causing most of his ongoing symptoms, not nodular lung dz of unknown origin.  I had an extended discussion with the patient/wife  reviewing all relevant studies (extensive inpt EPIC records) completed to date and  lasting 25 minutes of a 40  minute transition of care office visit with pt new to me    re  severe non-specific but potentially very serious refractory respiratory symptoms of uncertain and potentially multiple  etiologies.   Each maintenance medication was reviewed in detail including most importantly the difference between maintenance and prns and under what circumstances the prns are to be triggered using an action plan format that is not reflected in the computer generated alphabetically organized AVS.    Please see AVS for specific instructions unique to this office visit that I personally wrote and verbalized to the the pt in detail and then reviewed with pt  by my nurse highlighting any changes in therapy/plan of care  recommended at today's visit.

## 2016-12-02 NOTE — Assessment & Plan Note (Signed)
Body mass index is 42.47 kg/m.  -  trending unchanged  No results found for: TSH   Contributing to gerd risk/ doe/reviewed the need and the process to achieve and maintain neg calorie balance > defer f/u primary care including intermittently monitoring thyroid status

## 2016-12-02 NOTE — Assessment & Plan Note (Signed)
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  The only way I know  to prove this is not at least in part an "ACEi Case" is a trial off ACEi x a minimum of 6 weeks then regroup.

## 2017-01-01 ENCOUNTER — Ambulatory Visit (INDEPENDENT_AMBULATORY_CARE_PROVIDER_SITE_OTHER): Payer: 59 | Admitting: Internal Medicine

## 2017-01-01 ENCOUNTER — Encounter: Payer: Self-pay | Admitting: Internal Medicine

## 2017-01-01 VITALS — BP 135/65 | HR 83 | Wt 305.6 lb

## 2017-01-01 DIAGNOSIS — Z79899 Other long term (current) drug therapy: Secondary | ICD-10-CM

## 2017-01-01 DIAGNOSIS — Z9989 Dependence on other enabling machines and devices: Secondary | ICD-10-CM | POA: Diagnosis not present

## 2017-01-01 DIAGNOSIS — R042 Hemoptysis: Secondary | ICD-10-CM

## 2017-01-01 DIAGNOSIS — Z87891 Personal history of nicotine dependence: Secondary | ICD-10-CM | POA: Diagnosis not present

## 2017-01-01 DIAGNOSIS — Z7984 Long term (current) use of oral hypoglycemic drugs: Secondary | ICD-10-CM

## 2017-01-01 DIAGNOSIS — R918 Other nonspecific abnormal finding of lung field: Secondary | ICD-10-CM

## 2017-01-01 DIAGNOSIS — E119 Type 2 diabetes mellitus without complications: Secondary | ICD-10-CM

## 2017-01-01 DIAGNOSIS — Z6841 Body Mass Index (BMI) 40.0 and over, adult: Secondary | ICD-10-CM

## 2017-01-01 DIAGNOSIS — G4733 Obstructive sleep apnea (adult) (pediatric): Secondary | ICD-10-CM

## 2017-01-01 DIAGNOSIS — I10 Essential (primary) hypertension: Secondary | ICD-10-CM

## 2017-01-01 LAB — POCT GLYCOSYLATED HEMOGLOBIN (HGB A1C): HEMOGLOBIN A1C: 10.1

## 2017-01-01 LAB — GLUCOSE, CAPILLARY: Glucose-Capillary: 340 mg/dL — ABNORMAL HIGH (ref 65–99)

## 2017-01-01 MED ORDER — LIRAGLUTIDE 18 MG/3ML ~~LOC~~ SOPN: 1.2000 mg | PEN_INJECTOR | Freq: Every day | SUBCUTANEOUS | 2 refills | Status: DC

## 2017-01-01 NOTE — Assessment & Plan Note (Signed)
He uses CPAP daily.  Continue using CPAP.

## 2017-01-01 NOTE — Assessment & Plan Note (Signed)
His A1c today was increased to 10.1, it was 8.2 in April 2018.  Patient was taking metformin 500 mg twice daily, stating that it is causing a weight gain and he has gained 10 pounds in past month. He denies any change in his diet. He does not exercise regularly, stating that his job required a lot of travel and walking. He wants to get some help to lose weight.  -He was started on Victoza. As it should help with weight reduction and control of diabetes. He was instructed to start with 0.6 mg daily and then increase to 1.2 mg daily. -He was instructed to make an appointment with Butch Penny.

## 2017-01-01 NOTE — Assessment & Plan Note (Signed)
His hemoptysis has been dissolved. Cough has been decreased since he was switched from lisinopril. He follow-up with pulmonary and has an other appointment later this month. He is also going for lung function testing.

## 2017-01-01 NOTE — Assessment & Plan Note (Signed)
BP Readings from Last 3 Encounters:  01/01/17 135/65  12/01/16 124/64  10/23/16 138/60   His blood pressure was at goal. His lisinopril was switched to losartan because of cough by his pulmonologist. According to patient his cough is little improved with that change. He continued to have cough but denies any more hemoptysis.  Continue with current management with losartan 50 mg daily and Coreg 12.5 mg twice daily.

## 2017-01-01 NOTE — Assessment & Plan Note (Signed)
He continued to gain weight. His weight today was 305 pound, there was an increase of 10 pound since previous visit. According to patient he is gaining weight because of metformin.  He was counseled again regarding exercising regularly and diet control. As metformin is less likely to cause that much of weight gain.  He was advised to schedule an appointment with Butch Penny to help with his diabetes and weight reduction.

## 2017-01-01 NOTE — Progress Notes (Signed)
   CC: For follow-up of his diabetes and hypertension.  HPI:  [name] is a 61 y.o. gentleman with past medical history as listed below came to the clinic for follow-up of his diabetes and hypertension.  Please see assessment and plan section for his chronic problems.  Past Medical History:  Diagnosis Date  . Collagen vascular disease ([place])   . Coronary artery disease   . Diabetes mellitus without complication ([place])   . Hypertension    Review of Systems: Positive in bold. General: Denies fever, chills, fatigue, unexpected weight loss, change in appetite and diaphoresis.  Respiratory: Denies SOB, cough, DOE, chest tightness, and wheezing.   Cardiovascular: Denies chest pain and palpitations.  Gastrointestinal: Denies nausea, vomiting, abdominal pain, diarrhea, constipation, blood in stool and abdominal distention.  Genitourinary: Denies dysuria, urgency, frequency, hematuria, suprapubic pain and flank pain. Endocrine: Denies hot or cold intolerance, polyuria, and polydipsia. Musculoskeletal: Denies myalgias, back pain, joint swelling, arthralgias and gait problem.  Skin: Denies pallor, rash and wounds.  Neurological: Denies dizziness, headaches, weakness, lightheadedness, numbness, seizures, and syncope. Psychiatric/Behavioral: Denies mood changes, confusion, nervousness, sleep disturbance and agitation.  Physical Exam:  Vitals:   01/01/17 1419  BP: 135/65  Pulse: 83  SpO2: 97%  Weight: (!) 305 lb 9.6 oz (138.6 kg)    General: Vital signs reviewed.  Patient is well-developed and well-nourished, in no acute distress and cooperative with exam.  Cardiovascular: RRR, S1 normal, S2 normal, no murmurs, gallops, or rubs. Pulmonary/Chest: Clear to auscultation bilaterally, no wheezes, rales, or rhonchi. Abdominal: Soft, non-tender, non-distended, BS +, no masses, organomegaly, or guarding present.  Extremities: Trace lower extremity edema bilaterally,  pulses symmetric and  intact bilaterally. No cyanosis or clubbing. Neurological: A&O x3, Strength is normal and symmetric bilaterally, cranial nerve II-XII are grossly intact, no focal motor deficit, sensory intact to light touch bilaterally.  Skin: Warm, dry and intact. No rashes or erythema. Psychiatric: Normal mood and affect. speech and behavior is normal. Cognition and memory are normal.  Assessment & Plan:   See Encounters Tab for problem based charting.  Patient discussed with Dr. Dareen Piano.

## 2017-01-01 NOTE — Patient Instructions (Addendum)
Thank you for visiting clinic today. As we discussed your diabetes is uncontrolled. I am starting you on a new medication called Victoza, you will inject yourself once daily. You will start from 0.6 mg daily for 1 week then increase your dose to 1.2 mg. Please check your blood sugar at home and keep a log of it, or bring your glucometer meter with you during your next follow-up visit. Please follow-up in one month as I'm starting you on a new medication.  Liraglutide injection What is this medicine? LIRAGLUTIDE (LIR a GLOO tide) is used to improve blood sugar control in adults with type 2 diabetes. This medicine may be used with other diabetes medicines. This drug may also reduce the risk of heart attack or stroke if you have type 2 diabetes and risk factors for heart disease. This medicine may be used for other purposes; ask your health care provider or pharmacist if you have questions. COMMON BRAND NAME(S): Victoza What should I tell my health care provider before I take this medicine? They need to know if you have any of these conditions: -endocrine tumors (MEN 2) or if someone in your family had these tumors -gallbladder disease -high cholesterol -history of alcohol abuse problem -history of pancreatitis -kidney disease or if you are on dialysis -liver disease -previous swelling of the tongue, face, or lips with difficulty breathing, difficulty swallowing, hoarseness, or tightening of the throat -stomach problems -thyroid cancer or if someone in your family had thyroid cancer -an unusual or allergic reaction to liraglutide, other medicines, foods, dyes, or preservatives -pregnant or trying to get pregnant -breast-feeding How should I use this medicine? This medicine is for injection under the skin of your upper leg, stomach area, or upper arm. You will be taught how to prepare and give this medicine. Use exactly as directed. Take your medicine at regular intervals. Do not take it more  often than directed. It is important that you put your used needles and syringes in a special sharps container. Do not put them in a trash can. If you do not have a sharps container, call your pharmacist or healthcare provider to get one. A special MedGuide will be given to you by the pharmacist with each prescription and refill. Be sure to read this information carefully each time. Talk to your pediatrician regarding the use of this medicine in children. Special care may be needed. Overdosage: If you think you have taken too much of this medicine contact a poison control center or emergency room at once. NOTE: This medicine is only for you. Do not share this medicine with others. What if I miss a dose? If you miss a dose, take it as soon as you can. If it is almost time for your next dose, take only that dose. Do not take double or extra doses. What may interact with this medicine? -other medicines for diabetes Many medications may cause changes in blood sugar, these include: -alcohol containing beverages -antiviral medicines for HIV or AIDS -aspirin and aspirin-like drugs -certain medicines for blood pressure, heart disease, irregular heart beat -chromium -diuretics -male hormones, such as estrogens or progestins, birth control pills -fenofibrate -gemfibrozil -isoniazid -lanreotide -male hormones or anabolic steroids -MAOIs like Carbex, Eldepryl, Marplan, Nardil, and Parnate -medicines for weight loss -medicines for allergies, asthma, cold, or cough -medicines for depression, anxiety, or psychotic disturbances -niacin -nicotine -NSAIDs, medicines for pain and inflammation, like ibuprofen or naproxen -octreotide -pasireotide -pentamidine -phenytoin -probenecid -quinolone antibiotics such as ciprofloxacin, levofloxacin, ofloxacin -  some herbal dietary supplements -steroid medicines such as prednisone or cortisone -sulfamethoxazole; trimethoprim -thyroid hormones Some  medications can hide the warning symptoms of low blood sugar (hypoglycemia). You may need to monitor your blood sugar more closely if you are taking one of these medications. These include: -beta-blockers, often used for high blood pressure or heart problems (examples include atenolol, metoprolol, propranolol) -clonidine -guanethidine -reserpine This list may not describe all possible interactions. Give your health care provider a list of all the medicines, herbs, non-prescription drugs, or dietary supplements you use. Also tell them if you smoke, drink alcohol, or use illegal drugs. Some items may interact with your medicine. What should I watch for while using this medicine? Visit your doctor or health care professional for regular checks on your progress. Drink plenty of fluids while taking this medicine. Check with your doctor or health care professional if you get an attack of severe diarrhea, nausea, and vomiting. The loss of too much body fluid can make it dangerous for you to take this medicine. A test called the HbA1C (A1C) will be monitored. This is a simple blood test. It measures your blood sugar control over the last 2 to 3 months. You will receive this test every 3 to 6 months. Learn how to check your blood sugar. Learn the symptoms of low and high blood sugar and how to manage them. Always carry a quick-source of sugar with you in case you have symptoms of low blood sugar. Examples include hard sugar candy or glucose tablets. Make sure others know that you can choke if you eat or drink when you develop serious symptoms of low blood sugar, such as seizures or unconsciousness. They must get medical help at once. Tell your doctor or health care professional if you have high blood sugar. You might need to change the dose of your medicine. If you are sick or exercising more than usual, you might need to change the dose of your medicine. Do not skip meals. Ask your doctor or health care  professional if you should avoid alcohol. Many nonprescription cough and cold products contain sugar or alcohol. These can affect blood sugar. Pens should never be shared. Even if the needle is changed, sharing may result in passing of viruses like hepatitis or HIV. Wear a medical ID bracelet or chain, and carry a card that describes your disease and details of your medicine and dosage times. What side effects may I notice from receiving this medicine? Side effects that you should report to your doctor or health care professional as soon as possible: -allergic reactions like skin rash, itching or hives, swelling of the face, lips, or tongue -breathing problems -diarrhea that continues or is severe -lump or swelling on the neck -severe nausea -signs and symptoms of infection like fever or chills; cough; sore throat; pain or trouble passing urine -signs and symptoms of low blood sugar such as feeling anxious, confusion, dizziness, increased hunger, unusually weak or tired, sweating, shakiness, cold, irritable, headache, blurred vision, fast heartbeat, loss of consciousness -signs and symptoms of kidney injury like trouble passing urine or change in the amount of urine -trouble swallowing -unusual stomach upset or pain -vomiting Side effects that usually do not require medical attention (report to your doctor or health care professional if they continue or are bothersome): -constipation -decreased appetite -diarrhea -fatigue -headache -nausea -pain, redness, or irritation at site where injected -stomach upset -stuffy or runny nose This list may not describe all possible side effects. Call  your doctor for medical advice about side effects. You may report side effects to FDA at 1-800-FDA-1088. Where should I keep my medicine? Keep out of the reach of children. Store unopened pen in a refrigerator between 2 and 8 degrees C (36 and 46 degrees F). Do not freeze or use if the medicine has been  frozen. Protect from light and excessive heat. After you first use the pen, it can be stored at room temperature between 15 and 30 degrees C (59 and 86 degrees F) or in a refrigerator. Throw away your used pen after 30 days or after the expiration date, whichever comes first. Do not store your pen with the needle attached. If the needle is left on, medicine may leak from the pen. NOTE: This sheet is a summary. It may not cover all possible information. If you have questions about this medicine, talk to your doctor, pharmacist, or health care provider.  2018 Elsevier/Gold Standard (2016-05-28 14:39:40)

## 2017-01-01 NOTE — Assessment & Plan Note (Signed)
He needs a follow-up CT scan. It was ordered in April 2018. It has not done yet. According to patient it was scheduled twice and they canceled it a day before.  -Patient was advised to scheduled CT scan as soon as possible.

## 2017-01-06 NOTE — Progress Notes (Signed)
Internal Medicine Clinic Attending  Case discussed with Dr. Amin at the time of the visit.  We reviewed the resident's history and exam and pertinent patient test results.  I agree with the assessment, diagnosis, and plan of care documented in the resident's note.    

## 2017-01-10 ENCOUNTER — Encounter: Payer: Self-pay | Admitting: Internal Medicine

## 2017-01-11 NOTE — Telephone Encounter (Signed)
Patient need follow up appt around 9/10 (per Dr. Reesa Chew), patient available most Mon & Fri. Thanks!

## 2017-01-15 ENCOUNTER — Ambulatory Visit (INDEPENDENT_AMBULATORY_CARE_PROVIDER_SITE_OTHER): Payer: 59 | Admitting: Internal Medicine

## 2017-01-15 ENCOUNTER — Other Ambulatory Visit (INDEPENDENT_AMBULATORY_CARE_PROVIDER_SITE_OTHER): Payer: 59

## 2017-01-15 ENCOUNTER — Encounter: Payer: Self-pay | Admitting: Internal Medicine

## 2017-01-15 ENCOUNTER — Ambulatory Visit (INDEPENDENT_AMBULATORY_CARE_PROVIDER_SITE_OTHER)
Admission: RE | Admit: 2017-01-15 | Discharge: 2017-01-15 | Disposition: A | Discharge status: Home / Self Care | Payer: 59 | Source: Ambulatory Visit | Attending: Internal Medicine | Admitting: Internal Medicine

## 2017-01-15 VITALS — BP 132/82 | HR 82 | Ht 70.0 in | Wt 299.0 lb

## 2017-01-15 DIAGNOSIS — R05 Cough: Secondary | ICD-10-CM | POA: Diagnosis not present

## 2017-01-15 DIAGNOSIS — R058 Other specified cough: Secondary | ICD-10-CM

## 2017-01-15 DIAGNOSIS — R042 Hemoptysis: Secondary | ICD-10-CM

## 2017-01-15 DIAGNOSIS — I10 Essential (primary) hypertension: Secondary | ICD-10-CM

## 2017-01-15 LAB — CBC WITH DIFFERENTIAL/PLATELET
BASOS ABS: 0.1 10*3/uL (ref 0.0–0.1)
Basophils Relative: 1.3 % (ref 0.0–3.0)
EOS ABS: 1 10*3/uL — AB (ref 0.0–0.7)
Eosinophils Relative: 10.7 % — ABNORMAL HIGH (ref 0.0–5.0)
HCT: 41.5 % (ref 39.0–52.0)
Hemoglobin: 13.9 g/dL (ref 13.0–17.0)
LYMPHS ABS: 1.9 10*3/uL (ref 0.7–4.0)
Lymphocytes Relative: 20.4 % (ref 12.0–46.0)
MCHC: 33.6 g/dL (ref 30.0–36.0)
MCV: 83.7 fl (ref 78.0–100.0)
MONO ABS: 0.5 10*3/uL (ref 0.1–1.0)
Monocytes Relative: 5.5 % (ref 3.0–12.0)
NEUTROS PCT: 62.1 % (ref 43.0–77.0)
Neutro Abs: 5.7 10*3/uL (ref 1.4–7.7)
PLATELETS: 308 10*3/uL (ref 150.0–400.0)
RBC: 4.96 Mil/uL (ref 4.22–5.81)
RDW: 14.7 % (ref 11.5–15.5)
WBC: 9.2 10*3/uL (ref 4.0–10.5)

## 2017-01-15 LAB — PULMONARY FUNCTION TEST
DL/VA % pred: 97 %
DL/VA: 4.52 ml/min/mmHg/L
DLCO UNC % PRED: 65 %
DLCO unc: 21.04 ml/min/mmHg
FEF 25-75 PRE: 2.07 L/s
FEF 25-75 Post: 2.37 L/sec
FEF2575-%CHANGE-POST: 14 %
FEF2575-%PRED-POST: 80 %
FEF2575-%Pred-Pre: 70 %
FEV1-%CHANGE-POST: 5 %
FEV1-%Pred-Post: 64 %
FEV1-%Pred-Pre: 61 %
FEV1-POST: 2.32 L
FEV1-Pre: 2.21 L
FEV1FVC-%CHANGE-POST: 2 %
FEV1FVC-%Pred-Pre: 102 %
FEV6-%CHANGE-POST: 2 %
FEV6-%Pred-Post: 63 %
FEV6-%Pred-Pre: 62 %
FEV6-PRE: 2.83 L
FEV6-Post: 2.89 L
FEV6FVC-%Change-Post: 0 %
FEV6FVC-%PRED-PRE: 105 %
FEV6FVC-%Pred-Post: 105 %
FVC-%CHANGE-POST: 2 %
FVC-%PRED-PRE: 59 %
FVC-%Pred-Post: 60 %
FVC-POST: 2.89 L
FVC-Pre: 2.83 L
POST FEV1/FVC RATIO: 80 %
PRE FEV6/FVC RATIO: 100 %
Post FEV6/FVC ratio: 100 %
Pre FEV1/FVC ratio: 78 %
RV % PRED: 97 %
RV: 2.2 L
TLC % pred: 73 %
TLC: 5.15 L

## 2017-01-15 LAB — SEDIMENTATION RATE: SED RATE: 29 mm/h — AB (ref 0–20)

## 2017-01-15 MED ORDER — PANTOPRAZOLE SODIUM 40 MG PO TBEC: 40.0000 mg | DELAYED_RELEASE_TABLET | Freq: Every day | ORAL | 2 refills | Status: DC

## 2017-01-15 MED ORDER — FAMOTIDINE 20 MG PO TABS: ORAL_TABLET | ORAL | 11 refills | Status: DC

## 2017-01-15 NOTE — Progress Notes (Signed)
PFT done today. 

## 2017-01-15 NOTE — Progress Notes (Signed)
Spoke with pt and notified of results per Dr. Wert. Pt verbalized understanding and denied any questions. 

## 2017-01-15 NOTE — Assessment & Plan Note (Addendum)
Body mass index is 42.9 kg/m.  -  trending up  No results found for: TSH   pfts  01/15/2017   With ERV 50% c/w effects of body habitus Also has hbp/dm/hyperlipidemia  Contributing  Also to gerd risk/ doe/reviewed the need and the process to achieve and maintain neg calorie balance > defer f/u primary care including intermittently monitoring thyroid status

## 2017-01-15 NOTE — Patient Instructions (Signed)
Pantoprazole (protonix) 40 mg   Take  30-60 min before first meal of the day and Pepcid (famotidine)  20 mg one @  bedtime until return to office - this is the best way to tell whether stomach acid is contributing to your problem.     Please remember to go to the lab and x-ray department downstairs in the basement  for your tests - we will call you with the results when they are available.   GERD (REFLUX)  is an extremely common cause of respiratory symptoms just like yours , many times with no obvious heartburn at all.    It can be treated with medication, but also with lifestyle changes including elevation of the head of your bed (ideally with 6 inch  bed blocks),  Smoking cessation, avoidance of late meals, excessive alcohol, and avoid fatty foods, chocolate, peppermint, colas, red wine, and acidic juices such as orange juice.  NO MINT OR MENTHOL PRODUCTS SO NO COUGH DROPS   USE SUGARLESS CANDY INSTEAD (Jolley ranchers or Stover's or Life Savers) or even ice chips will also do - the key is to swallow to prevent all throat clearing. NO OIL BASED VITAMINS - use powdered substitutes.   Please schedule a follow up office visit in 6 weeks, call sooner if needed

## 2017-01-15 NOTE — Progress Notes (Signed)
Subjective:     Patient ID: [name] [name], male   DOB: [birth year],   MRN: [id number]     Brief patient profile:  61 yowm quit smoking  2014 with cough  And sinus congestion 100% resolved and maint on acei with chronic cough x decades then much worse than usual for about 4-5 days while on plavx/asa> admit with hemoptysis  Date of Admission: 08/23/2016  Date of Discharge: 08/26/2016    Discharge Diagnosis: 1. Hemoptysis  Active Problems:   Hemoptysis   Weight loss   Night sweats   OSA (obstructive sleep apnea)   Morbid obesity (HCC)   Lung nodules   Discharge Medications: Allergies as of 08/26/2016   No Known Allergies        Medication List    STOP taking these medications   aspirin 325 MG tablet   aspirin-sod bicarb-citric acid 325 MG Tbef tablet Commonly known as:  ALKA-SELTZER   prasugrel 10 MG Tabs tablet Commonly known as:  EFFIENT   ROBITUSSIN 12 HOUR COUGH 30 MG/5ML liquid Generic drug:  dextromethorphan     TAKE these medications   carvedilol 12.5 MG tablet Commonly known as:  COREG Take 12.5 mg by mouth daily.   CHLORASEPTIC 1.4 % Liqd Generic drug:  phenol Use as directed 1 spray in the mouth or throat as needed for throat irritation / pain.   chlorpheniramine-HYDROcodone 10-8 MG/5ML Suer Commonly known as:  TUSSIONEX Take 5 mLs by mouth every 12 (twelve) hours as needed for cough.   EMERGEN-C ELECTRO MIX Pack Take 1 packet by mouth daily as needed (supplement).   lisinopril 10 MG tablet Commonly known as:  PRINIVIL,ZESTRIL Take 10 mg by mouth daily.   metFORMIN 500 MG tablet Commonly known as:  GLUCOPHAGE Take 500 mg by mouth 2 (two) times daily with a meal.   rosuvastatin 10 MG tablet Commonly known as:  CRESTOR Take 10 mg by mouth at bedtime.     12/01/2016 1st [facility] office visit/ [name]   Chief Complaint  Patient presents with  . pulmonary consult    Pt states he was in the hospital due to hemoptysis, Pt  is concerned about hoarseness Pt states he not currenlty coughing up blood, Denies sob,wheezing, Patient states he had a bronch done and was told there was some blood in his lung  He is back to baseline chronic dry cough "always" per wife = maybe a decade, worse since first of year. [place] way to airport now and no limiting sob or cp or recurrent cough rec I strongly recommend you come off the lisinopril and replace it with an ARB (Avapro would be my recommendation)   Please schedule a follow up office visit in 6 weeks, call sooner if needed with pfts and all your medications    01/15/2017  f/u ov/[name] re: chronic cough x decades  Off acei since around 12/08/16  Chief Complaint  Patient presents with  . Follow-up    Pt had a PFT today and is here to find out about that. States he has been doing good since last visit. Pt is also here due to having his blood pressure pill changed from lisinopril to losartan. States that he does have occ. SOB on exertion with occ. cough. Cough is better than it was at last visit.  cough and throat clearing better but still worse as day goes on/ not keeping him up at noct or flaring in am  Really Not limited by breathing from desired  activities but very sedentary   No obvious day to day or daytime variability or assoc excess/ purulent sputum or mucus plugs or hemoptysis or cp or chest tightness, subjective wheeze or overt sinus or hb symptoms. No unusual exp hx or h/o childhood pna/ asthma or knowledge of premature birth.  Sleeping ok without nocturnal  or early am exacerbation  of respiratory  c/o's or need for noct saba. Also denies any obvious fluctuation of symptoms with weather or environmental changes or other aggravating or alleviating factors except as outlined above   Current Medications, Allergies, Complete Past Medical History, Past Surgical History, Family History, and Social History were reviewed in [employer] record.  ROS  The  following are not active complaints unless bolded sore throat, dysphagia, dental problems, itching, sneezing,  nasal congestion or excess/ purulent secretions, ear ache,   fever, chills, sweats, unintended wt loss, classically pleuritic or exertional cp,  orthopnea pnd or leg swelling, presyncope, palpitations, abdominal pain, anorexia, nausea, vomiting, diarrhea  or change in bowel or bladder habits, change in stools or urine, dysuria,hematuria,  rash, arthralgias, visual complaints, headache, numbness, weakness or ataxia or problems with walking or coordination,  change in mood/affect or memory.                    Objective:   Physical Exam   Obese  amb wm with classic voice fatigue        01/15/2017       299   12/01/16 296 lb (134.3 kg)  10/23/16 296 lb 9.6 oz (134.5 kg)  09/11/16 298 lb 14.4 oz (135.6 kg)    Vital signs reviewed - Note on arrival 02 sats  98% on RA    HEENT: nl dentition, turbinates bilaterally, and oropharynx. Nl external ear canals without cough reflex   NECK :  without JVD/Nodes/TM/ nl carotid upstrokes bilaterally   LUNGS: no acc muscle use,  Nl contour chest which is clear to A and P bilaterally without cough on insp or exp maneuvers   CV:  RRR  no s3 or murmur or increase in P2, and no edema   ABD:  Pot belly markedly obese contour but soft and nontender with nl inspiratory excursion in the supine position. No bruits or organomegaly appreciated, bowel sounds nl  MS:  Nl gait/ ext warm without deformities, calf tenderness, cyanosis or clubbing No obvious joint restrictions   SKIN: warm and dry without lesions    NEURO:  alert, approp, nl sensorium with  no motor or cerebellar deficits apparent.     I personally reviewed images and agree with radiology impression as follows:   Chest CTa 08/23/16 No evidence of pulmonary embolism on this motion degraded scan. 2. Numerous patchy pulmonary nodules throughout both lungs, most of which demonstrate  ill-defined margins with ground-glass halos, generally centrilobular in distribution. These findings are most suggestive of an infectious etiology such as due to bronchopneumonia or atypical pneumonia such as fungal pneumonia. Pulmonary metastatic disease is considered less likely but not entirely excluded and a follow-up post treatment chest CT is recommended. 3. Nonspecific mediastinal and right hilar lymphadenopathy, which should also be followed up on a posttreatment chest CT with IV contrast in 2-3 months           CXR PA and Lateral:   01/15/2017 :    I personally reviewed images and agree with radiology impression as follows:    No active cardiopulmonary disease.  Labs ordered 01/15/2017  Allergy profile     Lab Results  Component Value Date   ESRSEDRATE 29 (H) 01/15/2017           Assessment:

## 2017-01-15 NOTE — Assessment & Plan Note (Signed)
Changed from acei to losartan 12/08/16 for uacs  Adequate control on present rx, reviewed in detail with pt > no change in rx needed  For now   NB Although even in retrospect it may not be clear the ACEi contributed to the pt's symptoms,  Pt improved off them and adding them back at this point or in the future would risk confusion in interpretation of non-specific respiratory symptoms to which this patient is prone  ie  Better not to muddy the waters here.   Also, For reasons that may related to vascular permability and nitric oxide pathways but not elevated  bradykinin levels (as seen with  ACEi use) losartan in the generic form has been reported now from mulitple sources  to cause a similar pattern of non-specific  upper airway symptoms as seen with acei.   This has not been reported with exposure to the other ARB's to date, so it seems reasonable for now to try either generic  avapro if ARB needed or use an alternative class altogether.  See:  Lelon Frohlich Allergy Asthma Immunol  2008: 101: p [phone]    Since he bought one year supply of losartan will focus on other sources of uacs for now and consider changing out losartan if proves refractory

## 2017-01-15 NOTE — Assessment & Plan Note (Addendum)
Allergy profile 01/15/2017 >  Eos 1.0  /  IgE  ;pending  - trial of gerd rx 01/15/2017 >>>    Upper airway cough syndrome (previously labeled PNDS) , is  so named because it's frequently impossible to sort out how much is  CR/sinusitis with freq throat clearing (which can be related to primary GERD)   vs  causing  secondary (" extra esophageal")  GERD from wide swings in gastric pressure that occur with throat clearing, often  promoting self use of mint and menthol lozenges that reduce the lower esophageal sphincter tone and exacerbate the problem further in a cyclical fashion.   These are the same pts (now being labeled as having "irritable larynx syndrome" by some cough centers) who not infrequently have a history of having failed to tolerate ace inhibitors (and sometimes generic losartan - see hbp)   dry powder inhalers or biphosphonates or report having atypical/extraesophageal reflux symptoms that don't respond to standard doses of PPI  and are easily confused as having aecopd or asthma flares by even experienced allergists/ pulmonologists (myself included).    rec max rx for gerd/ diet and f/u in 6 weeks

## 2017-01-16 NOTE — Assessment & Plan Note (Addendum)
Assoc with abn ct chest  08/23/16 with ? Inflammatory nodules, mild adenopathy  - FOB 08/25/16 no def source / neg afb and fungal and neg ANCA/ quant gold TB - Rec off acei and f/u with pfts - PFT's  01/15/2017  FEV1 2.32 (64 % ) ratio 80  p 5 % improvement from saba p nothing prior to study with DLCO  65 % corrects to 67  % for alv volume    - ESR 01/15/2017 = 29  Hemoptysis has resolved, cough is better off acei but note marked eosinophilia raising the possibility of a low grade churge strauss syndrome or helminthic infection though we also haven't ruled out met dz here so will need to continue to monitor carefully > f/u q 6 weeks   I had an extended discussion with the patient reviewing all relevant studies completed to date and  lasting 15 to 20 minutes of a 25 minute visit    Each maintenance medication was reviewed in detail including most importantly the difference between maintenance and prns and under what circumstances the prns are to be triggered using an action plan format that is not reflected in the computer generated alphabetically organized AVS.    Please see AVS for specific instructions unique to this visit that I personally wrote and verbalized to the the pt in detail and then reviewed with pt  by my nurse highlighting any  changes in therapy recommended at today's visit to their plan of care.

## 2017-01-18 LAB — RESPIRATORY ALLERGY PROFILE REGION II ~~LOC~~
Allergen, A. alternata, m6: <0.1 kU/L
Allergen, C. Herbarum, M2: <0.1 kU/L
Allergen, Cedar tree, t12: <0.1 kU/L
Allergen, Comm Silver Birch, t9: <0.1 kU/L
Allergen, Cottonwood, t14: <0.1 kU/L
Allergen, Mulberry, t76: <0.1 kU/L
Allergen, P. notatum, m1: <0.1 kU/L
Aspergillus fumigatus, m3: <0.1 kU/L
Bermuda Grass: <0.1 kU/L
Box Elder IgE: <0.1 kU/L
Cockroach: <0.1 kU/L
Common Ragweed: <0.1 kU/L
IgE (Immunoglobulin E), Serum: 24 kU/L (ref <115)
Rough Pigweed  IgE: <0.1 kU/L
Sheep Sorrel IgE: <0.1 kU/L
Timothy Grass: <0.1 kU/L

## 2017-01-18 NOTE — Progress Notes (Signed)
ATC, NA and no option to leave VM

## 2017-01-22 ENCOUNTER — Telehealth: Payer: Self-pay | Admitting: *Deleted

## 2017-01-22 ENCOUNTER — Institutional Professional Consult (permissible substitution): Payer: 59 | Admitting: Emergency Medicine

## 2017-01-22 NOTE — Telephone Encounter (Addendum)
Information was sent through CoverMyMeds for PA for Victoza.  Awaiting determination within 72 hours.  Sander Nephew, RN8/31/2018 9:38 AM Victoza is  Now covered under patient's plan and requires no PA.  Sander Nephew, RN 9/5/20189:44 AM.

## 2017-01-22 NOTE — Progress Notes (Signed)
Spoke with pt and notified of results per Dr. Wert. Pt verbalized understanding and denied any questions. 

## 2017-02-16 ENCOUNTER — Encounter: Payer: Self-pay | Admitting: Internal Medicine

## 2017-02-17 NOTE — Telephone Encounter (Signed)
Called pt per dr Reesa Chew, appt fri 9/28 at 1515. He states at this time that 9/25 he had a "dizzy spell" and "passed out", states he "cracked" the back of his head open but did not seek medical care, he is cautioned greatly that if this occurs again he should call 911 and be evaluated. He is agreeable but laughs.

## 2017-02-19 ENCOUNTER — Ambulatory Visit: Payer: 59 | Admitting: Cardiology

## 2017-02-19 ENCOUNTER — Ambulatory Visit (HOSPITAL_COMMUNITY)
Admission: RE | Admit: 2017-02-19 | Discharge: 2017-02-19 | Disposition: A | Discharge status: Home / Self Care | Payer: 59 | Source: Ambulatory Visit | Attending: Internal Medicine | Admitting: Internal Medicine

## 2017-02-19 ENCOUNTER — Observation Stay: Admission: AD | Admit: 2017-02-19 | Payer: 59 | Source: Ambulatory Visit | Admitting: Internal Medicine

## 2017-02-19 ENCOUNTER — Encounter: Payer: 59 | Admitting: Internal Medicine

## 2017-02-19 ENCOUNTER — Ambulatory Visit (INDEPENDENT_AMBULATORY_CARE_PROVIDER_SITE_OTHER): Payer: 59 | Admitting: Internal Medicine

## 2017-02-19 ENCOUNTER — Encounter: Payer: Self-pay | Admitting: Internal Medicine

## 2017-02-19 VITALS — BP 136/73 | HR 108 | Temp 98.0°F | Ht 70.0 in | Wt 302.6 lb

## 2017-02-19 DIAGNOSIS — Y92000 Kitchen of unspecified non-institutional (private) residence as  the place of occurrence of the external cause: Secondary | ICD-10-CM

## 2017-02-19 DIAGNOSIS — R000 Tachycardia, unspecified: Secondary | ICD-10-CM | POA: Diagnosis not present

## 2017-02-19 DIAGNOSIS — Z7982 Long term (current) use of aspirin: Secondary | ICD-10-CM

## 2017-02-19 DIAGNOSIS — S0101XA Laceration without foreign body of scalp, initial encounter: Secondary | ICD-10-CM

## 2017-02-19 DIAGNOSIS — I10 Essential (primary) hypertension: Secondary | ICD-10-CM | POA: Diagnosis not present

## 2017-02-19 DIAGNOSIS — E119 Type 2 diabetes mellitus without complications: Secondary | ICD-10-CM

## 2017-02-19 DIAGNOSIS — I2510 Atherosclerotic heart disease of native coronary artery without angina pectoris: Secondary | ICD-10-CM

## 2017-02-19 DIAGNOSIS — Z7902 Long term (current) use of antithrombotics/antiplatelets: Secondary | ICD-10-CM

## 2017-02-19 DIAGNOSIS — Z955 Presence of coronary angioplasty implant and graft: Secondary | ICD-10-CM

## 2017-02-19 DIAGNOSIS — W1839XA Other fall on same level, initial encounter: Secondary | ICD-10-CM

## 2017-02-19 DIAGNOSIS — D72829 Elevated white blood cell count, unspecified: Secondary | ICD-10-CM

## 2017-02-19 DIAGNOSIS — Z87891 Personal history of nicotine dependence: Secondary | ICD-10-CM

## 2017-02-19 DIAGNOSIS — I4891 Unspecified atrial fibrillation: Secondary | ICD-10-CM | POA: Diagnosis not present

## 2017-02-19 DIAGNOSIS — I4510 Unspecified right bundle-branch block: Secondary | ICD-10-CM | POA: Diagnosis not present

## 2017-02-19 DIAGNOSIS — I480 Paroxysmal atrial fibrillation: Secondary | ICD-10-CM | POA: Diagnosis present

## 2017-02-19 DIAGNOSIS — R55 Syncope and collapse: Secondary | ICD-10-CM

## 2017-02-19 DIAGNOSIS — Z79899 Other long term (current) drug therapy: Secondary | ICD-10-CM

## 2017-02-19 DIAGNOSIS — Z7984 Long term (current) use of oral hypoglycemic drugs: Secondary | ICD-10-CM

## 2017-02-19 LAB — CBC WITH DIFFERENTIAL/PLATELET
BASOS ABS: 0.1 10*3/uL (ref 0.0–0.1)
Basophils Relative: 1 %
EOS PCT: 10 %
Eosinophils Absolute: 1 10*3/uL — ABNORMAL HIGH (ref 0.0–0.7)
HEMATOCRIT: 39.8 % (ref 39.0–52.0)
Hemoglobin: 13.6 g/dL (ref 13.0–17.0)
Lymphocytes Relative: 23 %
Lymphs Abs: 2.5 10*3/uL (ref 0.7–4.0)
MCH: 29 pg (ref 26.0–34.0)
MCHC: 34.2 g/dL (ref 30.0–36.0)
MCV: 84.9 fL (ref 78.0–100.0)
MONO ABS: 0.5 10*3/uL (ref 0.1–1.0)
MONOS PCT: 5 %
NEUTROS ABS: 6.7 10*3/uL (ref 1.7–7.7)
Neutrophils Relative %: 61 %
PLATELETS: 301 10*3/uL (ref 150–400)
RBC: 4.69 MIL/uL (ref 4.22–5.81)
RDW: 14.8 % (ref 11.5–15.5)
WBC: 10.7 10*3/uL — ABNORMAL HIGH (ref 4.0–10.5)

## 2017-02-19 LAB — GLUCOSE, CAPILLARY: Glucose-Capillary: 251 mg/dL — ABNORMAL HIGH (ref 65–99)

## 2017-02-19 LAB — COMPREHENSIVE METABOLIC PANEL
ALK PHOS: 57 U/L (ref 38–126)
ALT: 29 U/L (ref 17–63)
AST: 21 U/L (ref 15–41)
Albumin: 3.7 g/dL (ref 3.5–5.0)
Anion gap: 8 (ref 5–15)
BILIRUBIN TOTAL: 0.7 mg/dL (ref 0.3–1.2)
BUN: 10 mg/dL (ref 6–20)
CALCIUM: 10 mg/dL (ref 8.9–10.3)
CO2: 26 mmol/L (ref 22–32)
CREATININE: 0.73 mg/dL (ref 0.61–1.24)
Chloride: 100 mmol/L — ABNORMAL LOW (ref 101–111)
Glucose, Bld: 224 mg/dL — ABNORMAL HIGH (ref 65–99)
Potassium: 4.3 mmol/L (ref 3.5–5.1)
SODIUM: 134 mmol/L — AB (ref 135–145)
TOTAL PROTEIN: 7.1 g/dL (ref 6.5–8.1)

## 2017-02-19 LAB — TROPONIN I: Troponin I: <0.03 ng/mL (ref <0.03)

## 2017-02-19 MED ORDER — METOPROLOL TARTRATE 50 MG PO TABS: 50.0000 mg | ORAL_TABLET | Freq: Two times a day (BID) | ORAL | 1 refills | Status: DC

## 2017-02-19 NOTE — Progress Notes (Signed)
   CC:  Syncopal episode on Tuesday.  HPI:  [name] is a 61 y.o. with past medical history as listed below came to the clinic to get a referral for a cardiologist.  Patient recently moved to [place], most of his health care was in [place], he travels a lot for his job. He was under the care of cardiologist in [place], and was told that he needed some stent placement in his legs by his cardiologist.  He told me that he had a syncopal episode on Tuesday, while he was in [place]. He was standing in his kitchen, where he felt dizzy and lightheaded resulted in a fall causing a big laceration on the back of his head. He did had some urinary incontinence during that event, denies any previous history of seizures. He never seek any medical attention, trying to control his bleeding with icing. He flew to [facility] next day. According to patient since that event he was not feeling completely normal, occasionally become dizzy and lightheaded. He denies any more syncopal episode. He denies any chest pain, palpitations, exertional dyspnea, orthopnea or PND. According to him he had 2 stent placed in his heart in March 2018, never had any heart attack, blockage was found on angiography. He was never told that he had an abnormal rhythm.  He was also complaining of leg cramps, more like claudications, bilateral worse with walking and relieved with resting. He had a vascular ultrasound performed 2 weeks ago, and was told that he needed stent placement in his both legs. As patient recently moved to [facility] and wants to establish care with some cardiologist locally.  Past Medical History:  Diagnosis Date  . Collagen vascular disease ([place])   . Coronary artery disease   . Diabetes mellitus without complication ([place])   . Hypertension    Review of Systems:  Negative except mentioned in history of present illness.  Physical Exam:  Vitals:   02/19/17 1524  BP: 136/73  Pulse:  (!) 108  Temp: 98 F (36.7 C)  TempSrc: Oral  SpO2: 96%  Weight: (!) 302 lb 9.6 oz (137.3 kg)  Height: 5\' 10"  (1.778 m)    General: Vital signs reviewed.  Patient is well-developed and well-nourished, in no acute distress and cooperative with exam.  Head: Normocephalic. 4-5 cm laceration with a scab on the back of his head. Eyes: EOMI, conjunctivae normal, no scleral icterus.  Cardiovascular: Irregularly irregular with tachycardia. Pulmonary/Chest: Clear to auscultation bilaterally, no wheezes, rales, or rhonchi. Abdominal: Soft, non-tender, non-distended, BS +, no masses, organomegaly, or guarding present.  Extremities: No lower extremity edema bilaterally,  pulses symmetric and intact bilaterally. No cyanosis or clubbing. Neurological: A&O x3, Strength is normal and symmetric bilaterally, cranial nerve II-XII are grossly intact, no focal motor deficit, sensory intact to light touch bilaterally.  Skin: Warm, dry and intact. No rashes or erythema. Psychiatric: Normal mood and affect. speech and behavior is normal. Cognition and memory are normal.  Assessment & Plan:   See Encounters Tab for problem based charting.  Patient seen with Dr. Rebeca Alert

## 2017-02-19 NOTE — Assessment & Plan Note (Signed)
He does not check his blood sugar at home. According to patient he is compliant with metformin 500 mg twice a day, and Victoza 1.2 mg daily.  In clinic his CBG was 251.  He can continue current management, we will check A1c during next follow-up visit. He needs a better control of his diabetes. He was advised to check his blood sugar level at home and bring glucometer meter with him during next follow-up visit.

## 2017-02-19 NOTE — Assessment & Plan Note (Signed)
BP Readings from Last 3 Encounters:  02/19/17 136/73  01/15/17 132/82  01/01/17 135/65   His blood pressure was near goal today.  We will continue with losartan 50 mg daily.

## 2017-02-19 NOTE — Patient Instructions (Addendum)
Thank you for visiting clinic today. I'm concerned because of your abnormal heart rhythm, currently you are in atrial fibrillation with very fast heart rate. As we discussed I want to keep you in the hospital, but letting you go home according to your wishes. I am giving you a new medicine called metoprolol, you will take 50 mg tablet twice a day. We are also giving you a referral for cardiology please go to see them on Monday according to your appointment.  If you develop any worsening of dizziness, feeling some funny heart rhythm, chest pain please call 911 and seek medical attention immediately.

## 2017-02-19 NOTE — Assessment & Plan Note (Addendum)
He had this syncopal episode 3 days ago. On exam he was found to have irregular heart rate with tachycardia, prompted to do EKG which shows A. fib with RVR with ventricular rate of 127, right bundle branch block and T-wave inversions in inferior leads. We obtained some records from his cardiologist at [place], according to previous EKG sent he was in sinus rhythm with right axis deviation and T-wave similar abnormalities in inferior leads. I spoke with his cardiologist, A. fib is a new diagnosis for him. He do have coronary artery disease, and was on carvedilol 12.5 mg twice daily, along with aspirin and Plavix since his angioplasty in March 2018. According to these records his previous echo shows normal ejection fraction, with left ventricular hypertrophy and some septal hypokinesia along with left atrial dilatation.  Patient was advised to be admitted for heart rate control, possible cardioversion if needed and further workup. We had a long discussion which include risk associated with current heart rhythm, his recent syncopal episode was most likely due to some cardiac arrhythmia. Patient refused admission, stating that he wants to go home and will seek medical attention if he develops any worsening of his symptoms.  We changed his carvedilol with metoprolol 50 mg twice a day. We made a appointment with  Cardiology for this upcoming Monday. Initial stat lab obtained in clinic shows negative troponin, CBC was positive for mild leukocytosis, CMP for sodium of 134, glucose 228.  His CHADS Score was 2, making him 4% risk for thromboembolic event with out anticoagulation.He also needs an echo, I defer that decision for cardiologist.

## 2017-02-22 ENCOUNTER — Encounter: Payer: Self-pay | Admitting: Cardiology

## 2017-02-22 ENCOUNTER — Ambulatory Visit (INDEPENDENT_AMBULATORY_CARE_PROVIDER_SITE_OTHER): Payer: 59 | Admitting: Cardiology

## 2017-02-22 ENCOUNTER — Telehealth (HOSPITAL_COMMUNITY): Payer: Self-pay | Admitting: *Deleted

## 2017-02-22 VITALS — BP 124/68 | HR 77 | Ht 70.0 in | Wt 302.4 lb

## 2017-02-22 DIAGNOSIS — I4891 Unspecified atrial fibrillation: Secondary | ICD-10-CM

## 2017-02-22 DIAGNOSIS — I739 Peripheral vascular disease, unspecified: Secondary | ICD-10-CM | POA: Insufficient documentation

## 2017-02-22 DIAGNOSIS — I10 Essential (primary) hypertension: Secondary | ICD-10-CM | POA: Diagnosis not present

## 2017-02-22 DIAGNOSIS — I2510 Atherosclerotic heart disease of native coronary artery without angina pectoris: Secondary | ICD-10-CM

## 2017-02-22 DIAGNOSIS — G4733 Obstructive sleep apnea (adult) (pediatric): Secondary | ICD-10-CM

## 2017-02-22 DIAGNOSIS — I480 Paroxysmal atrial fibrillation: Secondary | ICD-10-CM | POA: Diagnosis not present

## 2017-02-22 DIAGNOSIS — E088 Diabetes mellitus due to underlying condition with unspecified complications: Secondary | ICD-10-CM | POA: Diagnosis not present

## 2017-02-22 DIAGNOSIS — Z87891 Personal history of nicotine dependence: Secondary | ICD-10-CM | POA: Insufficient documentation

## 2017-02-22 MED ORDER — DRONEDARONE HCL 400 MG PO TABS: 400.0000 mg | ORAL_TABLET | Freq: Two times a day (BID) | ORAL | 0 refills | Status: DC

## 2017-02-22 MED ORDER — APIXABAN 5 MG PO TABS: 5.0000 mg | ORAL_TABLET | Freq: Two times a day (BID) | ORAL | 0 refills | Status: DC

## 2017-02-22 NOTE — Patient Instructions (Addendum)
Medication Instructions:  Your physician has recommended you make the following change in your medication: START eliquis twice daily Multaq twice daily   Labwork: Your physician recommends that you have: BMP, CBC, TSH, LFT's  Testing/Procedures: You had an EKG done today   Your physician has requested that you have a lexiscan myoview. For further information please visit [email or web address]. Please follow instruction sheet, as given.    Follow-Up: 1 month  Appointment with Quay Burow will be set up/  Any Other Special Instructions Will Be Listed Below (If Applicable).     If you need a refill on your cardiac medications before your next appointment, please call your pharmacy.

## 2017-02-22 NOTE — Telephone Encounter (Signed)
Patient given detailed instructions per Myocardial Perfusion Study Information Sheet for the test on 02/24/17. Patient notified to arrive 15 minutes early and that it is imperative to arrive on time for appointment to keep from having the test rescheduled.  If you need to cancel or reschedule your appointment, please call the office within 24 hours of your appointment. . Patient verbalized understanding. Kirstie Peri

## 2017-02-22 NOTE — Progress Notes (Signed)
Cardiology Office Note:    Date:  02/22/2017   ID:  Hobert Poplaski, DOB [birth year], MRN [id number]  PCP:  Lorella Nimrod, MD  Cardiologist:  Jenean Lindau, MD   Referring MD: Lorella Nimrod, MD    ASSESSMENT:    1. Paroxysmal atrial fibrillation (HCC)   2. Atrial fibrillation with RVR ([place])   3. Coronary artery disease without angina pectoris, unspecified vessel or lesion type, unspecified whether native or transplanted heart   4. Essential hypertension   5. OSA (obstructive sleep apnea)   6. Morbid obesity due to excess calories ([place])   7. Diabetes mellitus due to underlying condition with complication, without long-term current use of insulin ([place])   8. Claudication in peripheral vascular disease ([place])   9. History of smoking    PLAN:    In order of problems listed above:  1. The patient has paroxysmal atrial fibrillation. I discussed with the patient atrial fibrillation, disease process. Management and therapy including rate and rhythm control, anticoagulation benefits and potential risks were discussed extensively with the patient. Patient had multiple questions which were answered to patient's satisfaction. For this reason I have initiated him on multi-and also eliquis 5 mg twice a day. Benefits and potential risks were explained and he and his significant other localized understanding and questions were answered to their satisfaction. He will continue with his aspirin and his Plavix. 2. Patient was scheduled to undergo peripheral vascular intervention we will try to obtain those records from his cardiologist and was referred him to our colleague who will evaluate him for this. 3. Secondary prevention discussed with the patient at extensive length. Compliance with diet medications regular exercise stressed. In view of the fact that he will undergo peripheral vascular intervention and that he has multiple risk factors for coronary artery disease he will undergo Lexiscan  sestamibi. 4. His blood pressure stable 5. Diet was discussed with dyslipidemia and diabetes mellitus. Risks of obesity explained. Diet was stressed. He will be seen in follow-up appointment in a month or earlier if he has any concerns.  Medication Adjustments/Labs and Tests Ordered: Current medicines are reviewed at length with the patient today.  Concerns regarding medicines are outlined above.  Orders Placed This Encounter  Procedures  . Hepatic function panel  . TSH  . MYOCARDIAL PERFUSION IMAGING  . EKG 12-Lead   Meds ordered this encounter  Medications  . dronedarone (MULTAQ) 400 MG tablet    Sig: Take 1 tablet (400 mg total) by mouth 2 (two) times daily with a meal.    Dispense:  60 tablet    Refill:  0  . apixaban (ELIQUIS) 5 MG TABS tablet    Sig: Take 1 tablet (5 mg total) by mouth 2 (two) times daily.    Dispense:  60 tablet    Refill:  0     History of Present Illness:    Yeshua Stryker is a 61 y.o. male who is being seen today for the evaluation of approximately fibrillation at the request of Lorella Nimrod, MD. Patient is a pleasant 61 year old male. He has past medical history of essential hypertension, diabetes mellitus, dyslipidemia, morbid obesity, sleep apnea, coronary artery disease post intervention and peripheral vascular disease. He is now here because she was found to be in A. Fib ablation with rapid ventricular rate and he is here for follow-up. This was diagnosed by his primary care physician. The initial advised was for hospitalization but he refused. At the time of my evaluation is  alert awake oriented and in no distress. His symptoms clearly suggest claudication and his have peripheral vascular evaluation and was supposed to be undergoing stenting this week at [facility].  Past Medical History:  Diagnosis Date  . Collagen vascular disease ([place])   . Coronary artery disease   . Diabetes mellitus without complication ([place])   . Hypertension     Past Surgical  History:  Procedure Laterality Date  . CARDIAC CATHETERIZATION  2011   s/p PCI  . CORONARY ANGIOPLASTY  2018  . VIDEO BRONCHOSCOPY Bilateral 08/25/2016   Procedure: VIDEO BRONCHOSCOPY WITHOUT FLUORO;  Surgeon: Collene Gobble, MD;  Location: [place];  Service: Cardiopulmonary;  Laterality: Bilateral;    Current Medications: Current Meds  Medication Sig  . aspirin EC 81 MG tablet Take 1 tablet (81 mg total) by mouth daily.  . B Complex Vitamins (VITAMIN B COMPLEX PO) Take 500 mg by mouth daily as needed.  . clopidogrel (PLAVIX) 75 MG tablet Take 1 tablet (75 mg total) by mouth daily.  . famotidine (PEPCID) 20 MG tablet One at bedtime  . liraglutide (VICTOZA) 18 MG/3ML SOPN Inject 0.2 mLs (1.2 mg total) into the skin daily at 10 pm. Start with 0.6 mg for one week, then increase to 1.2 mg daily.  Marland Kitchen losartan (COZAAR) 50 MG tablet Take 50 mg by mouth daily.  . metFORMIN (GLUCOPHAGE) 500 MG tablet Take 1 tablet (500 mg total) by mouth 2 (two) times daily with a meal. (Patient taking differently: Take 1,000 mg by mouth 2 (two) times daily with a meal. )  . metoprolol tartrate (LOPRESSOR) 50 MG tablet Take 1 tablet (50 mg total) by mouth 2 (two) times daily.  . pantoprazole (PROTONIX) 40 MG tablet Take 1 tablet (40 mg total) by mouth daily. Take 30-60 min before first meal of the day  . rosuvastatin (CRESTOR) 10 MG tablet Take 10 mg by mouth at bedtime.     Allergies:   Patient has no known allergies.   Social History   Social History  . Marital status: Divorced    Spouse name: N/A  . Number of children: N/A  . Years of education: N/A   Social History Main Topics  . Smoking status: Former Smoker    Packs/day: 1.00    Years: 40.00    Types: Cigarettes    Quit date: 2014  . Smokeless tobacco: Never Used  . Alcohol use 0.6 - 1.2 oz/week    1 - 2 Shots of liquor per week     Comment: once a month  . Drug use: No     Comment: marijuana in the 70s  . Sexual activity: Not Asked    Other Topics Concern  . None   Social History Narrative  . None     Family History: The patient's family history includes Alzheimer's disease in his mother; Breast cancer in his sister.  ROS:   Please see the history of present illness.    All other systems reviewed and are negative.  EKGs/Labs/Other Studies Reviewed:    The following studies were reviewed today: Reviewed records from referring physician and cardiologist and coronary angiography reports at extensive length and discussed with the patient. Questions that he and his significant other had were answered to their satisfaction   Recent Labs: 02/19/2017: ALT 29; BUN 10; Creatinine, Ser 0.73; Hemoglobin 13.6; Platelets 301; Potassium 4.3; Sodium 134  Recent Lipid Panel No results found for: CHOL, TRIG, HDL, CHOLHDL, VLDL, LDLCALC, LDLDIRECT  Physical Exam:  VS:  BP 124/68   Pulse 77   Ht 5\' 10"  (1.778 m)   Wt (!) 302 lb 6.4 oz (137.2 kg)   SpO2 97%   BMI 43.39 kg/m     Wt Readings from Last 3 Encounters:  02/22/17 (!) 302 lb 6.4 oz (137.2 kg)  02/19/17 (!) 302 lb 9.6 oz (137.3 kg)  01/15/17 299 lb (135.6 kg)     GEN: Patient is in no acute distress HEENT: Normal NECK: No JVD; No carotid bruits LYMPHATICS: No lymphadenopathy CARDIAC: S1 S2 regular, 2/6 systolic murmur at the apex. RESPIRATORY:  Clear to auscultation without rales, wheezing or rhonchi  ABDOMEN: Soft, non-tender, non-distended MUSCULOSKELETAL:  No edema; No deformity  SKIN: Warm and dry NEUROLOGIC:  Alert and oriented x 3 PSYCHIATRIC:  Normal affect    Signed, Jenean Lindau, MD  02/22/2017 1:44 PM    [facility]

## 2017-02-23 ENCOUNTER — Encounter: Payer: Self-pay | Admitting: Cardiology

## 2017-02-23 LAB — HEPATIC FUNCTION PANEL
ALK PHOS: 68 IU/L (ref 39–117)
ALT: 23 IU/L (ref 0–44)
AST: 16 IU/L (ref 0–40)
Albumin: 4 g/dL (ref 3.6–4.8)
BILIRUBIN TOTAL: 0.5 mg/dL (ref 0.0–1.2)
BILIRUBIN, DIRECT: 0.17 mg/dL (ref 0.00–0.40)
TOTAL PROTEIN: 7 g/dL (ref 6.0–8.5)

## 2017-02-23 LAB — TSH: TSH: 2.72 u[IU]/mL (ref 0.450–4.500)

## 2017-02-23 NOTE — Addendum Note (Signed)
Addended by: Oda Kilts on: 02/23/2017 04:59 PM   Modules accepted: Level of Service

## 2017-02-23 NOTE — Progress Notes (Signed)
Internal Medicine Clinic Attending  I saw and evaluated the patient.  I personally confirmed the key portions of the history and exam documented by Dr. Reesa Chew and I reviewed pertinent patient test results.  The assessment, diagnosis, and plan were formulated together and I agree with the documentation in the resident's note.  He presented with recent syncope and new finding of A. fib with RVR. I reviewed the EKG with Dr. Reesa Chew and agree with her assessment that shows A. fib with RVR. We both recommended that he be admitted for rate control, especially given his recent episode of syncope that was likely related to hypotension during an episode of RVR. However, his blood pressure was normal in clinic and he insisted on being able to go home. We discussed the risks of going home and why it would be safer to manage this patient, but he preferred to go home. Unfortunately, his care has been somewhat fractured due to having doctors in 2 different locations. However, we are able to get in touch with his cardiology office and obtain prior records, the confirmed he has no known A. fib. He also has no known valvular disease. His cardiologist recommended switching his carvedilol to metoprolol, which we have done. He was given careful instructions for return to care should he have any dizziness, chest pain, lightheadedness, or any other concerning change in his status. He was agreeable to this. He will see a cardiologist here in [place] early next week.  Oda Kilts, MD

## 2017-02-24 ENCOUNTER — Ambulatory Visit (HOSPITAL_COMMUNITY): Payer: 59 | Attending: Cardiology

## 2017-02-24 DIAGNOSIS — I2510 Atherosclerotic heart disease of native coronary artery without angina pectoris: Secondary | ICD-10-CM | POA: Diagnosis not present

## 2017-02-24 DIAGNOSIS — I4891 Unspecified atrial fibrillation: Secondary | ICD-10-CM | POA: Diagnosis not present

## 2017-02-24 DIAGNOSIS — I10 Essential (primary) hypertension: Secondary | ICD-10-CM | POA: Insufficient documentation

## 2017-02-24 DIAGNOSIS — I739 Peripheral vascular disease, unspecified: Secondary | ICD-10-CM | POA: Diagnosis not present

## 2017-02-24 MED ORDER — TECHNETIUM TC 99M TETROFOSMIN IV KIT
32.9000 | PACK | Freq: Once | INTRAVENOUS | Status: AC | PRN
  Administered 2017-02-24: 32.9 via INTRAVENOUS
  Filled 2017-02-24: qty 33

## 2017-02-25 ENCOUNTER — Ambulatory Visit (HOSPITAL_COMMUNITY): Payer: 59 | Attending: Cardiology

## 2017-02-25 DIAGNOSIS — I4891 Unspecified atrial fibrillation: Secondary | ICD-10-CM | POA: Diagnosis not present

## 2017-02-25 LAB — MYOCARDIAL PERFUSION IMAGING
CHL CUP NUCLEAR SRS: 5
CHL CUP RESTING HR STRESS: 79 {beats}/min
LV sys vol: 112 mL
LVDIAVOL: 201 mL (ref 62–150)
NUC STRESS TID: 0.93
Peak HR: 98 {beats}/min
RATE: 0.35
SDS: 4
SSS: 7

## 2017-02-25 MED ORDER — TECHNETIUM TC 99M TETROFOSMIN IV KIT
33.0000 | PACK | Freq: Once | INTRAVENOUS | Status: AC | PRN
  Administered 2017-02-25: 33 via INTRAVENOUS
  Filled 2017-02-25: qty 33

## 2017-02-25 MED ORDER — REGADENOSON 0.4 MG/5ML IV SOLN
0.4000 mg | Freq: Once | INTRAVENOUS | Status: AC
  Administered 2017-02-25: 0.4 mg via INTRAVENOUS

## 2017-02-26 ENCOUNTER — Ambulatory Visit (INDEPENDENT_AMBULATORY_CARE_PROVIDER_SITE_OTHER): Payer: 59 | Admitting: Internal Medicine

## 2017-02-26 ENCOUNTER — Encounter: Payer: Self-pay | Admitting: Cardiovascular Disease

## 2017-02-26 ENCOUNTER — Encounter: Payer: Self-pay | Admitting: Internal Medicine

## 2017-02-26 ENCOUNTER — Ambulatory Visit (INDEPENDENT_AMBULATORY_CARE_PROVIDER_SITE_OTHER): Payer: 59 | Admitting: Cardiovascular Disease

## 2017-02-26 ENCOUNTER — Encounter: Payer: 59 | Admitting: Internal Medicine

## 2017-02-26 ENCOUNTER — Other Ambulatory Visit: Payer: Self-pay

## 2017-02-26 VITALS — BP 144/78 | HR 80 | Ht 70.0 in | Wt 298.0 lb

## 2017-02-26 DIAGNOSIS — I10 Essential (primary) hypertension: Secondary | ICD-10-CM

## 2017-02-26 DIAGNOSIS — I739 Peripheral vascular disease, unspecified: Secondary | ICD-10-CM

## 2017-02-26 DIAGNOSIS — R05 Cough: Secondary | ICD-10-CM

## 2017-02-26 DIAGNOSIS — R058 Other specified cough: Secondary | ICD-10-CM

## 2017-02-26 MED ORDER — IRBESARTAN 75 MG PO TABS: 75.0000 mg | ORAL_TABLET | Freq: Every day | ORAL | 2 refills | Status: DC

## 2017-02-26 NOTE — Patient Instructions (Signed)
Medication Instructions: Your physician recommends that you continue on your current medications as directed. Please refer to the Current Medication list given to you today.  Testing/Procedures: Your physician has requested that you have a lower extremity arterial duplex. During this test, ultrasound is used to evaluate arterial blood flow in the legs. Allow one hour for this exam. There are no restrictions or special instructions.  Your physician has requested that you have an ankle brachial index (ABI). During this test an ultrasound and blood pressure cuff are used to evaluate the arteries that supply the arms and legs with blood. Allow thirty minutes for this exam. There are no restrictions or special instructions.  Follow-Up: I will call you with the results of your test.

## 2017-02-26 NOTE — Progress Notes (Signed)
Subjective:     Patient ID: [name] [name], male   DOB: 06-02-1955,   MRN: [id number]     Brief patient profile:  61 yowm quit smoking  2014 with cough  And sinus congestion 100% resolved and maint on acei with chronic cough x decades then much worse than usual for about 4-5 days while on plavx/asa> admit with hemoptysis  Date of Admission: 08/23/2016  Date of Discharge: 08/26/2016    Discharge Diagnosis: 1. Hemoptysis  Active Problems:   Hemoptysis   Weight loss   Night sweats   OSA (obstructive sleep apnea)   Morbid obesity (HCC)   Lung nodules   Discharge Medications: Allergies as of 08/26/2016   No Known Allergies        Medication List    STOP taking these medications   aspirin 325 MG tablet   aspirin-sod bicarb-citric acid 325 MG Tbef tablet Commonly known as:  ALKA-SELTZER   prasugrel 10 MG Tabs tablet Commonly known as:  EFFIENT   ROBITUSSIN 12 HOUR COUGH 30 MG/5ML liquid Generic drug:  dextromethorphan     TAKE these medications   carvedilol 12.5 MG tablet Commonly known as:  COREG Take 12.5 mg by mouth daily.   CHLORASEPTIC 1.4 % Liqd Generic drug:  phenol Use as directed 1 spray in the mouth or throat as needed for throat irritation / pain.   chlorpheniramine-HYDROcodone 10-8 MG/5ML Suer Commonly known as:  TUSSIONEX Take 5 mLs by mouth every 12 (twelve) hours as needed for cough.   EMERGEN-C ELECTRO MIX Pack Take 1 packet by mouth daily as needed (supplement).   lisinopril 10 MG tablet Commonly known as:  PRINIVIL,ZESTRIL Take 10 mg by mouth daily.   metFORMIN 500 MG tablet Commonly known as:  GLUCOPHAGE Take 500 mg by mouth 2 (two) times daily with a meal.   rosuvastatin 10 MG tablet Commonly known as:  CRESTOR Take 10 mg by mouth at bedtime.     12/01/2016 1st [facility] office visit/ [name] [name]   Chief Complaint  Patient presents with  . pulmonary consult    Pt states he was in the hospital due to hemoptysis, Pt  is concerned about hoarseness Pt states he not currenlty coughing up blood, Denies sob,wheezing, Patient states he had a bronch done and was told there was some blood in his lung  He is back to baseline chronic dry cough "always" per wife = maybe a decade, worse since first of year. On his way to airport now and no limiting sob or cp or recurrent cough rec I strongly recommend you come off the lisinopril and replace it with an ARB (Avapro would be my recommendation)   Please schedule a follow up office visit in 6 weeks, call sooner if needed with pfts and all your medications    01/15/2017  f/u ov/[name] [name] re: chronic cough x decades  Off acei since around 12/08/16  Chief Complaint  Patient presents with  . Follow-up    Pt had a PFT today and is here to find out about that. States he has been doing good since last visit. Pt is also here due to having his blood pressure pill changed from lisinopril to losartan. States that he does have occ. SOB on exertion with occ. cough. Cough is better than it was at last visit.  cough and throat clearing better but still worse as day goes on/ not keeping him up at noct or flaring in am Really Not limited by breathing from desired activities  but very sedentary  rec Pantoprazole (protonix) 40 mg   Take  30-60 min before first meal of the day and Pepcid (famotidine)  20 mg one @  bedtime until return to office - this is the best way to tell whether stomach acid is contributing to your problem.   Please remember to go to the lab and x-ray department downstairs in the basement  for your tests - we will call you with the results when they are available. GERD diet     02/26/2017  f/u ov/[name] [name] re: restrictive changes from obesity/ cough c/w uacs/improved 50%   Chief Complaint  Patient presents with  . Follow-up    Breathing is overall doing well. He is coughing less. No new co's today.   stops about half way down [place] the gate at airport due to legs giving out > sob   Sleeping fine horizontal 2 pillows  Still has urge to clear throat but does not disturb sleep    No obvious day to day or daytime variability or assoc excess/ purulent sputum or mucus plugs or hemoptysis or cp or chest tightness, subjective wheeze or overt sinus or hb symptoms. No unusual exp hx or h/o childhood pna/ asthma or knowledge of premature birth.  Sleeping ok 2 pillows without nocturnal  or early am exacerbation  of respiratory  c/o's or need for noct saba or daytime hypersomnolence. Also denies any obvious fluctuation of symptoms with weather or environmental changes or other aggravating or alleviating factors except as outlined above   Current Allergies, Complete Past Medical History, Past Surgical History, Family History, and Social History were reviewed in [employer] record.  ROS  The following are not active complaints unless bolded Hoarseness, sore throat, dysphagia, dental problems, itching, sneezing,  nasal congestion or discharge of excess mucus or purulent secretions, ear ache,   fever, chills, sweats, unintended wt loss or wt gain, classically pleuritic or exertional cp,  orthopnea pnd or leg swelling, presyncope, palpitations, abdominal pain, anorexia, nausea, vomiting, diarrhea  or change in bowel habits or change in bladder habits, change in stools or change in urine, dysuria, hematuria,  rash, arthralgias, visual complaints, headache, numbness, weakness or ataxia or problems with walking or coordination,  change in mood/affect or memory.        Current Meds  Medication Sig  . apixaban (ELIQUIS) 5 MG TABS tablet Take 1 tablet (5 mg total) by mouth 2 (two) times daily.  Marland Kitchen aspirin EC 81 MG tablet Take 1 tablet (81 mg total) by mouth daily.  . B Complex Vitamins (VITAMIN B COMPLEX PO) Take 500 mg by mouth daily as needed.  . clopidogrel (PLAVIX) 75 MG tablet Take 1 tablet (75 mg total) by mouth daily.  Marland Kitchen dronedarone (MULTAQ) 400 MG tablet Take 1  tablet (400 mg total) by mouth 2 (two) times daily with a meal.  . famotidine (PEPCID) 20 MG tablet One at bedtime  . liraglutide (VICTOZA) 18 MG/3ML SOPN Inject 0.2 mLs (1.2 mg total) into the skin daily at 10 pm. Start with 0.6 mg for one week, then increase to 1.2 mg daily.  . metFORMIN (GLUCOPHAGE) 1000 MG tablet Take 1,000 mg by mouth 2 (two) times daily with a meal.  . metoprolol tartrate (LOPRESSOR) 50 MG tablet Take 1 tablet (50 mg total) by mouth 2 (two) times daily.  . pantoprazole (PROTONIX) 40 MG tablet Take 1 tablet (40 mg total) by mouth daily. Take 30-60 min before first meal of the  day  . rosuvastatin (CRESTOR) 10 MG tablet Take 10 mg by mouth at bedtime.  . [ ]  losartan (COZAAR) 50 MG tablet Take 50 mg by mouth daily.                         Objective:   Physical Exam   Obese  amb wm with throat clearing / voice better about 50%    02/26/2017        298   01/15/2017       299   12/01/16 296 lb (134.3 kg)  10/23/16 296 lb 9.6 oz (134.5 kg)  09/11/16 298 lb 14.4 oz (135.6 kg)    Vital signs reviewed - Note on arrival 02 sats  96% on RA and bp 144/78    HEENT: nl dentition, turbinates bilaterally, and oropharynx. Nl external ear canals without cough reflex   NECK :  without JVD/Nodes/TM/ nl carotid upstrokes bilaterally   LUNGS: no acc muscle use,  Nl contour chest which is clear to A and P bilaterally without cough on insp or exp maneuvers   CV:  RRR  no s3 or murmur or increase in P2, and no edema   ABD:  Pot belly markedly obese contour but soft and nontender with nl inspiratory excursion in the supine position. No bruits or organomegaly appreciated, bowel sounds nl  MS:  Nl gait/ ext warm without deformities, calf tenderness, cyanosis or clubbing No obvious joint restrictions   SKIN: warm and dry without lesions    NEURO:  alert, approp, nl sensorium with  no motor or cerebellar deficits apparent.          Assessment:          Outpatient  Encounter Prescriptions as of 02/26/2017  Medication Sig  . apixaban (ELIQUIS) 5 MG TABS tablet Take 1 tablet (5 mg total) by mouth 2 (two) times daily.  Marland Kitchen aspirin EC 81 MG tablet Take 1 tablet (81 mg total) by mouth daily.  . B Complex Vitamins (VITAMIN B COMPLEX PO) Take 500 mg by mouth daily as needed.  . clopidogrel (PLAVIX) 75 MG tablet Take 1 tablet (75 mg total) by mouth daily.  Marland Kitchen dronedarone (MULTAQ) 400 MG tablet Take 1 tablet (400 mg total) by mouth 2 (two) times daily with a meal.  . famotidine (PEPCID) 20 MG tablet One at bedtime  . liraglutide (VICTOZA) 18 MG/3ML SOPN Inject 0.2 mLs (1.2 mg total) into the skin daily at 10 pm. Start with 0.6 mg for one week, then increase to 1.2 mg daily.  . metFORMIN (GLUCOPHAGE) 1000 MG tablet Take 1,000 mg by mouth 2 (two) times daily with a meal.  . metoprolol tartrate (LOPRESSOR) 50 MG tablet Take 1 tablet (50 mg total) by mouth 2 (two) times daily.  . pantoprazole (PROTONIX) 40 MG tablet Take 1 tablet (40 mg total) by mouth daily. Take 30-60 min before first meal of the day  . rosuvastatin (CRESTOR) 10 MG tablet Take 10 mg by mouth at bedtime.  . [DISCONTINUED] losartan (COZAAR) 50 MG tablet Take 50 mg by mouth daily.  . irbesartan (AVAPRO) 75 MG tablet Take 1 tablet (75 mg total) by mouth daily.  . [DISCONTINUED] metFORMIN (GLUCOPHAGE) 500 MG tablet Take 1 tablet (500 mg total) by mouth 2 (two) times daily with a meal. (Patient taking differently: Take 1,000 mg by mouth 2 (two) times daily with a meal. )   No facility-administered encounter medications on file as of  02/26/2017.     

## 2017-02-26 NOTE — Assessment & Plan Note (Signed)
Changed from acei to losartan 12/08/16 due to uacs > improved but not resolved 01/15/2017  - try d/c losartan 02/26/2017 > f/u PCP on avapro 75 mg daily instead

## 2017-02-26 NOTE — Addendum Note (Signed)
Addended by: Therisa Doyne on: 02/26/2017 03:58 PM   Modules accepted: Orders

## 2017-02-26 NOTE — Progress Notes (Signed)
02/26/2017 [place] [name] [name]   Oct 08, 1955  [id number]  Primary Physician Lorella Nimrod, MD Primary Cardiologist: Lorretta Harp MD Lupe Carney, [place]  HPI:  [name] [name] is a 61 y.o. male divorced with 3 children and is accompanied by his friend Stoney Bang. He was referred by Dr. Basilio Cairo for peripheral vascular evaluation because of iced limiting claudication. He does have a history of obesity, treated hypertension, hyperlipidemia and diabetes. He quit smoking in 2014 after smoking 40 pack years. 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 He is in multiple airports and has noticed discomfort in his legs when he walks. He does have a history of ischemic heart disease with stents placed in 2010 and as recently as March 2018. His Effient was changed to Plavix when he had an episode of hemoptysis over the exact etiology was unclear. He has obstructive sleep apnea not on CPAP. He apparently had syncope 2 weeks ago in [place] and has had a recent diagnosis of atrial fibrillation currently in sinus rhythm.   Current Meds  Medication Sig  . apixaban (ELIQUIS) 5 MG TABS tablet Take 1 tablet (5 mg total) by mouth 2 (two) times daily.  Marland Kitchen aspirin EC 81 MG tablet Take 1 tablet (81 mg total) by mouth daily.  . B Complex Vitamins (VITAMIN B COMPLEX PO) Take 500 mg by mouth daily as needed.  . clopidogrel (PLAVIX) 75 MG tablet Take 1 tablet (75 mg total) by mouth daily.  Marland Kitchen dronedarone (MULTAQ) 400 MG tablet Take 1 tablet (400 mg total) by mouth 2 (two) times daily with a meal.  . famotidine (PEPCID) 20 MG tablet One at bedtime  . irbesartan (AVAPRO) 75 MG tablet Take 1 tablet (75 mg total) by mouth daily.  Marland Kitchen liraglutide (VICTOZA) 18 MG/3ML SOPN Inject 0.2 mLs (1.2 mg total) into the skin daily at 10 pm. Start with 0.6 mg for one week, then increase to 1.2 mg daily.  . metFORMIN (GLUCOPHAGE) 1000 MG tablet Take 1,000 mg by mouth 2 (two) times daily with  a meal.  . metoprolol tartrate (LOPRESSOR) 50 MG tablet Take 1 tablet (50 mg total) by mouth 2 (two) times daily.  . pantoprazole (PROTONIX) 40 MG tablet Take 1 tablet (40 mg total) by mouth daily. Take 30-60 min before first meal of the day  . rosuvastatin (CRESTOR) 10 MG tablet Take 10 mg by mouth at bedtime.     No Known Allergies  Social History   Social History  . Marital status: Divorced    Spouse name: N/A  . Number of children: N/A  . Years of education: N/A   Occupational History  . Not on file.   Social History Main Topics  . Smoking status: Former Smoker    Packs/day: 1.00    Years: 40.00    Types: Cigarettes    Quit date: 2014  . Smokeless tobacco: Never Used  . Alcohol use 0.6 - 1.2 oz/week    1 - 2 Shots of liquor per week     Comment: once a month  . Drug use: No     Comment: marijuana in the 70s  . Sexual activity: Not on file   Other Topics Concern  . Not on file   Social History Narrative  . No narrative on file     Review of Systems: General: negative for chills, fever, night sweats or weight changes.  Cardiovascular: negative for chest pain, dyspnea  on exertion, edema, orthopnea, palpitations, paroxysmal nocturnal dyspnea or shortness of breath Dermatological: negative for rash Respiratory: negative for cough or wheezing Urologic: negative for hematuria Abdominal: negative for nausea, vomiting, diarrhea, bright red blood per rectum, melena, or hematemesis Neurologic: negative for visual changes, syncope, or dizziness All other systems reviewed and are otherwise negative except as noted above.    Blood pressure (!) 146/76, pulse 84, height 5\' 10"  (1.778 m), weight 296 lb (134.3 kg).  General appearance: alert and no distress Neck: no adenopathy, no carotid bruit, no JVD, supple, symmetrical, trachea midline and thyroid not enlarged, symmetric, no tenderness/mass/nodules Lungs: clear to auscultation bilaterally Heart: regular rate and rhythm,  S1, S2 normal, no murmur, click, rub or gallop Extremities: extremities normal, atraumatic, no cyanosis or edema Pulses: Diminished pedal pulses Skin: Skin color, texture, turgor normal. No rashes or lesions Neurologic: Alert and oriented X 3, normal strength and tone. Normal symmetric reflexes. Normal coordination and gait  EKG not performed today  ASSESSMENT AND PLAN:   Claudication in peripheral vascular disease [facility]) Mr. Koenen has life limiting claudication with positive risk factors and recent Dopplers performed a lateral that showed SFA and tibial vessel disease. He has diminished pedal pulses. A repeat his lower extremity lateral Doppler studies and based on this plan angiography and endovascular therapy. He will need to stop his Eliquis 3 days prior to the procedure.      Lorretta Harp MD FACP,FACC,FAHA, [facility] 02/26/2017 3:52 PM

## 2017-02-26 NOTE — Assessment & Plan Note (Signed)
pfts  01/15/2017   With ERV 50% c/w effects of body habitus Also has hbp/dm/hyperlipidemia    Body mass index is 42.76 kg/m.  -  trending no change Lab Results  Component Value Date   TSH 2.720 02/22/2017     Contributing to gerd risk/ doe/reviewed the need and the process to achieve and maintain neg calorie balance > defer f/u primary care including intermittently monitoring thyroid status

## 2017-02-26 NOTE — Assessment & Plan Note (Signed)
[name] [name] has life limiting claudication with positive risk factors and recent Dopplers performed a lateral that showed SFA and tibial vessel disease. He has diminished pedal pulses. A repeat his lower extremity lateral Doppler studies and based on this plan angiography and endovascular therapy. He will need to stop his Eliquis 3 days prior to the procedure.

## 2017-02-26 NOTE — Patient Instructions (Addendum)
Change the losartan to avapro 75 mg one daily (ibesartan) and your throat clearing should improve and if not may need to see throat specialist   See your PCP in 6 weeks     If you are satisfied with your treatment plan,  let your doctor know and he/she can either refill your medications or you can return here when your prescription runs out.     If in any way you are not 100% satisfied,  please tell us.  If 100% better, tell your friends!  Pulmonary follow up is as needed

## 2017-02-26 NOTE — Assessment & Plan Note (Addendum)
D/c'd ACEi 12/08/16 > 50% improved Allergy profile 01/15/2017 >  Eos 1.0  /  IgE  24 RAST neg - trial of gerd rx 01/15/2017 >>>  - changed cozar to avapro 02/26/2017   I had an extended final summary discussion with the patient reviewing all relevant studies completed to date and  lasting 15 to 20 minutes of a 25 minute visit on the following issues:    Much better but still urge to clear throat daytime. For reasons that may related to vascular permability and nitric oxide pathways but not elevated  bradykinin levels (as seen with  ACEi use) losartan in the generic form has been reported now from mulitple sources  to cause a similar pattern of non-specific  upper airway symptoms as seen with acei.   This has not been reported with exposure to the other ARB's to date, so it seems reasonable for now to try either generic   avapro  (see separate a/p)  See:  Lelon Frohlich Allergy Asthma Immunol  2008: 101: p [phone]    If not better next step is ENT eval > defer to PCP   Each maintenance medication was reviewed in detail including most importantly the difference between maintenance and as needed and under what circumstances the prns are to be used.  Please see AVS for specific  Instructions which are unique to this visit and I personally typed out  which were reviewed in detail in writing with the patient and a copy provided.

## 2017-03-05 ENCOUNTER — Ambulatory Visit (HOSPITAL_COMMUNITY): Payer: 59 | Attending: Internal Medicine

## 2017-03-05 ENCOUNTER — Other Ambulatory Visit: Payer: Self-pay

## 2017-03-05 DIAGNOSIS — G4733 Obstructive sleep apnea (adult) (pediatric): Secondary | ICD-10-CM | POA: Diagnosis not present

## 2017-03-05 DIAGNOSIS — E785 Hyperlipidemia, unspecified: Secondary | ICD-10-CM | POA: Insufficient documentation

## 2017-03-05 DIAGNOSIS — I10 Essential (primary) hypertension: Secondary | ICD-10-CM | POA: Insufficient documentation

## 2017-03-05 DIAGNOSIS — E119 Type 2 diabetes mellitus without complications: Secondary | ICD-10-CM | POA: Insufficient documentation

## 2017-03-09 ENCOUNTER — Ambulatory Visit: Payer: 59 | Admitting: Cardiovascular Disease

## 2017-03-15 ENCOUNTER — Ambulatory Visit (HOSPITAL_COMMUNITY)
Admission: RE | Admit: 2017-03-15 | Discharge: 2017-03-15 | Disposition: A | Discharge status: Home / Self Care | Payer: 59 | Source: Ambulatory Visit | Attending: Cardiology | Admitting: Cardiology

## 2017-03-15 DIAGNOSIS — E1151 Type 2 diabetes mellitus with diabetic peripheral angiopathy without gangrene: Secondary | ICD-10-CM | POA: Diagnosis not present

## 2017-03-15 DIAGNOSIS — I10 Essential (primary) hypertension: Secondary | ICD-10-CM | POA: Insufficient documentation

## 2017-03-15 DIAGNOSIS — I739 Peripheral vascular disease, unspecified: Secondary | ICD-10-CM | POA: Diagnosis not present

## 2017-03-15 DIAGNOSIS — I2510 Atherosclerotic heart disease of native coronary artery without angina pectoris: Secondary | ICD-10-CM | POA: Insufficient documentation

## 2017-03-15 DIAGNOSIS — R0989 Other specified symptoms and signs involving the circulatory and respiratory systems: Secondary | ICD-10-CM | POA: Diagnosis not present

## 2017-03-18 ENCOUNTER — Telehealth: Payer: Self-pay | Admitting: Cardiovascular Disease

## 2017-03-18 NOTE — Telephone Encounter (Signed)
Received records from [facility] as requested by Dr Gwenlyn Found.  Records given to Dr Gwenlyn Found to review.  Records also faxed to [facility] - Dr Basilio Cairo for appointment on 04/02/17. lp

## 2017-03-22 ENCOUNTER — Encounter: Payer: Self-pay | Admitting: Cardiovascular Disease

## 2017-03-26 ENCOUNTER — Ambulatory Visit (INDEPENDENT_AMBULATORY_CARE_PROVIDER_SITE_OTHER): Payer: 59 | Admitting: Cardiovascular Disease

## 2017-03-26 VITALS — BP 123/72 | HR 75 | Ht 70.0 in | Wt 300.0 lb

## 2017-03-26 DIAGNOSIS — I480 Paroxysmal atrial fibrillation: Secondary | ICD-10-CM | POA: Diagnosis not present

## 2017-03-26 DIAGNOSIS — I10 Essential (primary) hypertension: Secondary | ICD-10-CM

## 2017-03-26 DIAGNOSIS — I2510 Atherosclerotic heart disease of native coronary artery without angina pectoris: Secondary | ICD-10-CM | POA: Diagnosis not present

## 2017-03-26 DIAGNOSIS — I739 Peripheral vascular disease, unspecified: Secondary | ICD-10-CM | POA: Diagnosis not present

## 2017-03-26 NOTE — Progress Notes (Signed)
[name] [name] returns today for follow-up of his Doppler studies. Recent Dopplers suggest occluded SFAs bilaterally with a possible right iliac lesion. He is symptomatic. Based on this we'll proceed with scheduling angiography and endovascular therapy.  Lorretta Harp, M.D., [place], [facility], [place] [address] [place], [place]  [address]  [phone] 03/26/2017 3:42 PM

## 2017-03-26 NOTE — Assessment & Plan Note (Signed)
[name] [name] returns today for follow-up of his Doppler studies. Recent Dopplers suggest occluded SFAs bilaterally with a possible right iliac lesion. He is symptomatic. Based on this we'll proceed with scheduling angiography and endovascular therapy.

## 2017-03-26 NOTE — Patient Instructions (Addendum)
   [place] [address] [place] [address] Dept: [phone] Loc: [phone]  [name] [name]  03/26/2017  You are scheduled for a Peripheral Angiogram on Thursday, November 8 with Dr. Quay Burow.  1. Please arrive at the [facility] (Main Entrance A) at [facility]: [address] [place], [place] [address] at 7:30 AM (two hours before your procedure to ensure your preparation). Free valet parking service is available.   Special note: Every effort is made to have your procedure done on time. Please understand that emergencies sometimes delay scheduled procedures.  2. Diet: Do not eat or drink anything after midnight prior to your procedure except sips of water to take medications.  3. Labs: Please have labs drawn in our office today.  4. Medication instructions in preparation for your procedure:  Stop Metformin on Wednesday, November 7 and continue to hold for 48 hours after your procedure.  Do not take ANY diabetic medications on the morning of your procedure.  STOP Eliquis 2 days prior to your procedure.  On the morning of your procedure, take your Plavix/Clopidogrel and any morning medicines NOT listed above.  You may use sips of water.  5. Plan for one night stay--bring personal belongings. 6. Bring a current list of your medications and current insurance cards. 7. You MUST have a responsible person to drive you home. 8. Someone MUST be with you the first 24 hours after you arrive home or your discharge will be delayed. 9. Please wear clothes that are easy to get on and off and wear slip-on shoes.  Thank you for allowing [place] to care for you!   -- [facility]   Post-procedure Follow-Up:  Your physician has requested that you have a lower extremity arterial duplex. During this test, ultrasound is used to evaluate arterial blood flow in  the legs. Allow one hour for this exam. There are no restrictions or special instructions.  Your physician has requested that you have an ankle brachial index (ABI). During this test an ultrasound and blood pressure cuff are used to evaluate the arteries that supply the arms and legs with blood. Allow thirty minutes for this exam. There are no restrictions or special instructions.  Your physician recommends that you schedule a follow-up appointment in: 2 weeks after procedure with Dr. Gwenlyn Found.

## 2017-03-27 LAB — BASIC METABOLIC PANEL
BUN / CREAT RATIO: 18 (ref 10–24)
BUN: 12 mg/dL (ref 8–27)
CO2: 24 mmol/L (ref 20–29)
CREATININE: 0.67 mg/dL — AB (ref 0.76–1.27)
Calcium: 10 mg/dL (ref 8.6–10.2)
Chloride: 102 mmol/L (ref 96–106)
GFR, EST AFRICAN AMERICAN: 121 mL/min/{1.73_m2} (ref >59)
GFR, EST NON AFRICAN AMERICAN: 104 mL/min/{1.73_m2} (ref >59)
Glucose: 150 mg/dL — ABNORMAL HIGH (ref 65–99)
Potassium: 4.5 mmol/L (ref 3.5–5.2)
Sodium: 139 mmol/L (ref 134–144)

## 2017-03-27 LAB — CBC WITH DIFFERENTIAL/PLATELET
Basophils Absolute: 0.1 10*3/uL (ref 0.0–0.2)
Basos: 1 %
EOS (ABSOLUTE): 0.9 10*3/uL — ABNORMAL HIGH (ref 0.0–0.4)
EOS: 10 %
HEMATOCRIT: 37.1 % — AB (ref 37.5–51.0)
HEMOGLOBIN: 12.3 g/dL — AB (ref 13.0–17.7)
Immature Grans (Abs): 0 10*3/uL (ref 0.0–0.1)
Immature Granulocytes: 0 %
LYMPHS ABS: 2.2 10*3/uL (ref 0.7–3.1)
Lymphs: 24 %
MCH: 28.8 pg (ref 26.6–33.0)
MCHC: 33.2 g/dL (ref 31.5–35.7)
MCV: 87 fL (ref 79–97)
MONOCYTES: 7 %
Monocytes Absolute: 0.6 10*3/uL (ref 0.1–0.9)
NEUTROS ABS: 5.2 10*3/uL (ref 1.4–7.0)
Neutrophils: 58 %
Platelets: 288 10*3/uL (ref 150–379)
RBC: 4.27 x10E6/uL (ref 4.14–5.80)
RDW: 14.3 % (ref 12.3–15.4)
WBC: 9 10*3/uL (ref 3.4–10.8)

## 2017-03-27 LAB — PROTIME-INR
INR: 1 (ref 0.8–1.2)
PROTHROMBIN TIME: 10.4 s (ref 9.1–12.0)

## 2017-03-27 LAB — APTT: aPTT: 31 s (ref 24–33)

## 2017-03-30 ENCOUNTER — Other Ambulatory Visit: Payer: Self-pay | Admitting: Cardiovascular Disease

## 2017-03-30 ENCOUNTER — Encounter: Payer: Self-pay | Admitting: Cardiovascular Disease

## 2017-03-30 DIAGNOSIS — I739 Peripheral vascular disease, unspecified: Secondary | ICD-10-CM

## 2017-04-01 ENCOUNTER — Ambulatory Visit (HOSPITAL_COMMUNITY)
Admission: RE | Admit: 2017-04-01 | Discharge: 2017-04-02 | Disposition: A | Discharge status: Home / Self Care | Payer: 59 | Source: Ambulatory Visit | Attending: Cardiovascular Disease | Admitting: Cardiovascular Disease

## 2017-04-01 ENCOUNTER — Encounter (HOSPITAL_COMMUNITY)
Admission: RE | Disposition: A | Discharge status: Home / Self Care | Payer: Self-pay | Source: Ambulatory Visit | Attending: Cardiovascular Disease

## 2017-04-01 ENCOUNTER — Other Ambulatory Visit: Payer: Self-pay

## 2017-04-01 ENCOUNTER — Encounter (HOSPITAL_COMMUNITY): Payer: Self-pay | Admitting: Cardiovascular Disease

## 2017-04-01 DIAGNOSIS — Z7901 Long term (current) use of anticoagulants: Secondary | ICD-10-CM | POA: Diagnosis not present

## 2017-04-01 DIAGNOSIS — Z87891 Personal history of nicotine dependence: Secondary | ICD-10-CM | POA: Diagnosis not present

## 2017-04-01 DIAGNOSIS — Z6841 Body Mass Index (BMI) 40.0 and over, adult: Secondary | ICD-10-CM | POA: Insufficient documentation

## 2017-04-01 DIAGNOSIS — Z7982 Long term (current) use of aspirin: Secondary | ICD-10-CM | POA: Insufficient documentation

## 2017-04-01 DIAGNOSIS — E669 Obesity, unspecified: Secondary | ICD-10-CM | POA: Insufficient documentation

## 2017-04-01 DIAGNOSIS — G4733 Obstructive sleep apnea (adult) (pediatric): Secondary | ICD-10-CM | POA: Diagnosis not present

## 2017-04-01 DIAGNOSIS — I10 Essential (primary) hypertension: Secondary | ICD-10-CM | POA: Insufficient documentation

## 2017-04-01 DIAGNOSIS — Z7984 Long term (current) use of oral hypoglycemic drugs: Secondary | ICD-10-CM | POA: Diagnosis not present

## 2017-04-01 DIAGNOSIS — E1151 Type 2 diabetes mellitus with diabetic peripheral angiopathy without gangrene: Secondary | ICD-10-CM | POA: Diagnosis not present

## 2017-04-01 DIAGNOSIS — Z955 Presence of coronary angioplasty implant and graft: Secondary | ICD-10-CM | POA: Insufficient documentation

## 2017-04-01 DIAGNOSIS — I480 Paroxysmal atrial fibrillation: Secondary | ICD-10-CM | POA: Insufficient documentation

## 2017-04-01 DIAGNOSIS — E785 Hyperlipidemia, unspecified: Secondary | ICD-10-CM | POA: Insufficient documentation

## 2017-04-01 DIAGNOSIS — I739 Peripheral vascular disease, unspecified: Secondary | ICD-10-CM | POA: Diagnosis present

## 2017-04-01 DIAGNOSIS — Z7902 Long term (current) use of antithrombotics/antiplatelets: Secondary | ICD-10-CM | POA: Insufficient documentation

## 2017-04-01 DIAGNOSIS — I70212 Atherosclerosis of native arteries of extremities with intermittent claudication, left leg: Secondary | ICD-10-CM | POA: Diagnosis not present

## 2017-04-01 HISTORY — DX: Type 2 diabetes mellitus without complications: E11.9

## 2017-04-01 HISTORY — DX: Gastro-esophageal reflux disease without esophagitis: K21.9

## 2017-04-01 HISTORY — PX: PERIPHERAL VASCULAR INTERVENTION: CATH118257

## 2017-04-01 HISTORY — PX: LOWER EXTREMITY ANGIOGRAPHY: CATH118251

## 2017-04-01 HISTORY — DX: Pure hypercholesterolemia, unspecified: E78.00

## 2017-04-01 HISTORY — DX: Obstructive sleep apnea (adult) (pediatric): G47.33

## 2017-04-01 HISTORY — DX: Pneumonia, unspecified organism: J18.9

## 2017-04-01 LAB — GLUCOSE, CAPILLARY
GLUCOSE-CAPILLARY: 183 mg/dL — AB (ref 65–99)
GLUCOSE-CAPILLARY: 219 mg/dL — AB (ref 65–99)
Glucose-Capillary: 241 mg/dL — ABNORMAL HIGH (ref 65–99)
Glucose-Capillary: 173 mg/dL — ABNORMAL HIGH (ref 65–99)
Glucose-Capillary: 158 mg/dL — ABNORMAL HIGH (ref 65–99)

## 2017-04-01 LAB — POCT ACTIVATED CLOTTING TIME
Activated Clotting Time: 186 seconds
Activated Clotting Time: 202 seconds
Activated Clotting Time: 213 seconds
Activated Clotting Time: 180 seconds

## 2017-04-01 SURGERY — LOWER EXTREMITY ANGIOGRAPHY
Anesthesia: LOCAL | Laterality: Left

## 2017-04-01 MED ORDER — LABETALOL HCL 5 MG/ML IV SOLN: 10.0000 mg | INTRAVENOUS | Status: DC | PRN

## 2017-04-01 MED ORDER — SODIUM CHLORIDE 0.9 % WEIGHT BASED INFUSION: 3.0000 mL/kg/h | INTRAVENOUS | Status: DC

## 2017-04-01 MED ORDER — HEPARIN SODIUM (PORCINE) 1000 UNIT/ML IJ SOLN
INTRAMUSCULAR | Status: AC
  Filled 2017-04-01: qty 1

## 2017-04-01 MED ORDER — INSULIN ASPART 100 UNIT/ML ~~LOC~~ SOLN
0.0000 [IU] | Freq: Three times a day (TID) | SUBCUTANEOUS | Status: DC
  Administered 2017-04-01: 5 [IU] via SUBCUTANEOUS
  Administered 2017-04-02: 3 [IU] via SUBCUTANEOUS

## 2017-04-01 MED ORDER — HEPARIN SODIUM (PORCINE) 1000 UNIT/ML IJ SOLN
INTRAMUSCULAR | Status: DC | PRN
  Administered 2017-04-01: 8000 [IU] via INTRAVENOUS
  Administered 2017-04-01: 2500 [IU] via INTRAVENOUS
  Administered 2017-04-01: 5000 [IU] via INTRAVENOUS

## 2017-04-01 MED ORDER — CLOPIDOGREL BISULFATE 75 MG PO TABS
75.0000 mg | ORAL_TABLET | Freq: Every day | ORAL | Status: DC
  Administered 2017-04-01: 22:00:00 75 mg via ORAL
  Filled 2017-04-01: qty 1

## 2017-04-01 MED ORDER — HEPARIN (PORCINE) IN NACL 2-0.9 UNIT/ML-% IJ SOLN
INTRAMUSCULAR | Status: AC | PRN
  Administered 2017-04-01: 1000 mL

## 2017-04-01 MED ORDER — MORPHINE SULFATE (PF) 4 MG/ML IV SOLN: 2.0000 mg | INTRAVENOUS | Status: DC | PRN

## 2017-04-01 MED ORDER — SODIUM CHLORIDE 0.9% FLUSH: 3.0000 mL | INTRAVENOUS | Status: DC | PRN

## 2017-04-01 MED ORDER — LIDOCAINE HCL (PF) 1 % IJ SOLN
INTRAMUSCULAR | Status: AC
  Filled 2017-04-01: qty 30

## 2017-04-01 MED ORDER — METOPROLOL TARTRATE 25 MG PO TABS
50.0000 mg | ORAL_TABLET | Freq: Two times a day (BID) | ORAL | Status: DC
  Administered 2017-04-01: 22:00:00 50 mg via ORAL
  Filled 2017-04-01 (×2): qty 2

## 2017-04-01 MED ORDER — CLOPIDOGREL BISULFATE 75 MG PO TABS: 75.0000 mg | ORAL_TABLET | Freq: Every day | ORAL | Status: DC

## 2017-04-01 MED ORDER — LIDOCAINE HCL (PF) 1 % IJ SOLN
INTRAMUSCULAR | Status: DC | PRN
  Administered 2017-04-01: 28 mL via INTRADERMAL

## 2017-04-01 MED ORDER — SODIUM CHLORIDE 0.9 % IV SOLN: 250.0000 mL | INTRAVENOUS | Status: DC | PRN

## 2017-04-01 MED ORDER — ASPIRIN EC 81 MG PO TBEC: 81.0000 mg | DELAYED_RELEASE_TABLET | Freq: Every day | ORAL | Status: DC

## 2017-04-01 MED ORDER — ASPIRIN 81 MG PO CHEW: 81.0000 mg | CHEWABLE_TABLET | ORAL | Status: DC

## 2017-04-01 MED ORDER — ONDANSETRON HCL 4 MG/2ML IJ SOLN: 4.0000 mg | Freq: Four times a day (QID) | INTRAMUSCULAR | Status: DC | PRN

## 2017-04-01 MED ORDER — ANGIOPLASTY BOOK
Freq: Once | Status: AC
  Administered 2017-04-01: 22:00:00 1
  Filled 2017-04-01: qty 1

## 2017-04-01 MED ORDER — ACETAMINOPHEN 325 MG PO TABS: 650.0000 mg | ORAL_TABLET | ORAL | Status: DC | PRN

## 2017-04-01 MED ORDER — LIRAGLUTIDE 18 MG/3ML ~~LOC~~ SOPN: 1.2000 mg | PEN_INJECTOR | Freq: Every day | SUBCUTANEOUS | Status: DC

## 2017-04-01 MED ORDER — SODIUM CHLORIDE 0.9% FLUSH
3.0000 mL | Freq: Two times a day (BID) | INTRAVENOUS | Status: DC
Start: 2017-04-01 — End: 2017-04-02

## 2017-04-01 MED ORDER — PANTOPRAZOLE SODIUM 40 MG PO TBEC
40.0000 mg | DELAYED_RELEASE_TABLET | Freq: Every day | ORAL | Status: DC
  Filled 2017-04-01: qty 1

## 2017-04-01 MED ORDER — IRBESARTAN 75 MG PO TABS
75.0000 mg | ORAL_TABLET | Freq: Every day | ORAL | Status: DC
  Filled 2017-04-01: qty 1

## 2017-04-01 MED ORDER — PNEUMOCOCCAL VAC POLYVALENT 25 MCG/0.5ML IJ INJ
0.5000 mL | INJECTION | INTRAMUSCULAR | Status: DC
  Filled 2017-04-01: qty 0.5

## 2017-04-01 MED ORDER — INFLUENZA VAC SPLIT QUAD 0.5 ML IM SUSY: 0.5000 mL | PREFILLED_SYRINGE | INTRAMUSCULAR | Status: DC

## 2017-04-01 MED ORDER — ASPIRIN EC 81 MG PO TBEC
81.0000 mg | DELAYED_RELEASE_TABLET | Freq: Every day | ORAL | Status: DC
  Filled 2017-04-01: qty 1

## 2017-04-01 MED ORDER — FAMOTIDINE 20 MG PO TABS
20.0000 mg | ORAL_TABLET | Freq: Every day | ORAL | Status: DC
  Administered 2017-04-01: 22:00:00 20 mg via ORAL
  Filled 2017-04-01: qty 1

## 2017-04-01 MED ORDER — DRONEDARONE HCL 400 MG PO TABS
400.0000 mg | ORAL_TABLET | Freq: Two times a day (BID) | ORAL | Status: DC
  Administered 2017-04-01: 400 mg via ORAL
  Filled 2017-04-01 (×3): qty 1

## 2017-04-01 MED ORDER — SODIUM CHLORIDE 0.9 % IV SOLN: INTRAVENOUS | Status: AC

## 2017-04-01 MED ORDER — IODIXANOL 320 MG/ML IV SOLN
INTRAVENOUS | Status: DC | PRN
  Administered 2017-04-01: 195 mL via INTRAVENOUS

## 2017-04-01 MED ORDER — HEPARIN (PORCINE) IN NACL 2-0.9 UNIT/ML-% IJ SOLN
INTRAMUSCULAR | Status: AC
  Filled 2017-04-01: qty 1000

## 2017-04-01 MED ORDER — HYDRALAZINE HCL 20 MG/ML IJ SOLN: 5.0000 mg | INTRAMUSCULAR | Status: DC | PRN

## 2017-04-01 MED ORDER — SODIUM CHLORIDE 0.9 % WEIGHT BASED INFUSION: 1.0000 mL/kg/h | INTRAVENOUS | Status: DC

## 2017-04-01 MED ORDER — ROSUVASTATIN CALCIUM 10 MG PO TABS
10.0000 mg | ORAL_TABLET | Freq: Every day | ORAL | Status: DC
  Administered 2017-04-01: 22:00:00 10 mg via ORAL
  Filled 2017-04-01: qty 1

## 2017-04-01 SURGICAL SUPPLY — 23 items
BALLN ARMADA 8X20X80 (BALLOONS) ×3
BALLOON ARMADA 8X20X80 (BALLOONS) IMPLANT
CATH ANGIO 5F PIGTAIL 65CM (CATHETERS) ×1 IMPLANT
CATH CROSS OVER TEMPO 5F (CATHETERS) ×1 IMPLANT
CATH STRAIGHT 5FR 65CM (CATHETERS) ×1 IMPLANT
DEVICE CONTINUOUS FLUSH (MISCELLANEOUS) ×1 IMPLANT
GUIDEWIRE LT ZIPWIRE 035X260 (WIRE) ×1 IMPLANT
KIT ENCORE 26 ADVANTAGE (KITS) ×1 IMPLANT
KIT PV (KITS) ×3 IMPLANT
SHEATH PINNACLE 5F 10CM (SHEATH) ×1 IMPLANT
SHEATH PINNACLE 7F 10CM (SHEATH) ×1 IMPLANT
SHEATH PINNACLE ST 7F 45CM (SHEATH) ×1 IMPLANT
STENT OMNILINK ELITE 7X19X80 (Permanent Stent) ×1 IMPLANT
STOPCOCK MORSE 400PSI 3WAY (MISCELLANEOUS) ×1 IMPLANT
SYRINGE MEDRAD AVANTA MACH 7 (SYRINGE) ×1 IMPLANT
TAPE VIPERTRACK RADIOPAQ (MISCELLANEOUS) IMPLANT
TAPE VIPERTRACK RADIOPAQUE (MISCELLANEOUS) ×3
TRANSDUCER W/STOPCOCK (MISCELLANEOUS) ×3 IMPLANT
TRAY PV CATH (CUSTOM PROCEDURE TRAY) ×3 IMPLANT
TUBING CIL FLEX 10 FLL-RA (TUBING) ×1 IMPLANT
WIRE HITORQ VERSACORE ST 145CM (WIRE) ×1 IMPLANT
WIRE J 3MM .035X145CM (WIRE) ×1 IMPLANT
WIRE ROSEN-J .035X260CM (WIRE) ×1 IMPLANT

## 2017-04-01 NOTE — H&P (Signed)
Lorretta Harp, MD  Physician  Cardiology  Progress Notes  Signed  Encounter Date:  02/26/2017       Related encounter: Office Visit from 02/26/2017 in [place] All Collapse All       [] Hide copied text  [] Hover for details       04/01/17 [name] [name]   1955/09/10  [id number]  Primary Physician Lorella Nimrod, MD Primary Cardiologist: Lorretta Harp MD Lupe Carney, [place]  HPI:  [name] [name] is a 61 y.o. male divorced with 3 children and is accompanied by his friend Stoney Bang. He was referred by Dr. Basilio Cairo for peripheral vascular evaluation because of iced limiting claudication. He does have a history of obesity, treated hypertension, hyperlipidemia and diabetes. He quit smoking in 2014 after smoking 40 pack years. 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 He is in multiple airports and has noticed discomfort in his legs when he walks. He does have a history of ischemic heart disease with stents placed in 2010 and as recently as March 2018. His Effient was changed to Plavix when he had an episode of hemoptysis over the exact etiology was unclear. He has obstructive sleep apnea not on CPAP. He apparently had syncope 2 weeks ago in [place] and has had a recent diagnosis of atrial fibrillation currently in sinus rhythm.   ActiveMedications      Current Meds  Medication Sig  . apixaban (ELIQUIS) 5 MG TABS tablet Take 1 tablet (5 mg total) by mouth 2 (two) times daily.  Marland Kitchen aspirin EC 81 MG tablet Take 1 tablet (81 mg total) by mouth daily.  . B Complex Vitamins (VITAMIN B COMPLEX PO) Take 500 mg by mouth daily as needed.  . clopidogrel (PLAVIX) 75 MG tablet Take 1 tablet (75 mg total) by mouth daily.  Marland Kitchen dronedarone (MULTAQ) 400 MG tablet Take 1 tablet (400 mg total) by mouth 2 (two) times daily with a meal.  . famotidine (PEPCID) 20 MG tablet One at bedtime  .  irbesartan (AVAPRO) 75 MG tablet Take 1 tablet (75 mg total) by mouth daily.  Marland Kitchen liraglutide (VICTOZA) 18 MG/3ML SOPN Inject 0.2 mLs (1.2 mg total) into the skin daily at 10 pm. Start with 0.6 mg for one week, then increase to 1.2 mg daily.  . metFORMIN (GLUCOPHAGE) 1000 MG tablet Take 1,000 mg by mouth 2 (two) times daily with a meal.  . metoprolol tartrate (LOPRESSOR) 50 MG tablet Take 1 tablet (50 mg total) by mouth 2 (two) times daily.  . pantoprazole (PROTONIX) 40 MG tablet Take 1 tablet (40 mg total) by mouth daily. Take 30-60 min before first meal of the day  . rosuvastatin (CRESTOR) 10 MG tablet Take 10 mg by mouth at bedtime.       No Known Allergies  Social History        Social History  . Marital status: Divorced    Spouse name: N/A  . Number of children: N/A  . Years of education: N/A      Occupational History  . Not on file.   Social History Main Topics  . Smoking status: Former Smoker    Packs/day: 1.00    Years: 40.00    Types: Cigarettes    Quit date: 2014  . Smokeless tobacco: Never Used  . Alcohol use 0.6 - 1.2 oz/week  1 - 2 Shots of liquor per week     Comment: once a month  . Drug use: No     Comment: marijuana in the 70s  . Sexual activity: Not on file       Other Topics Concern  . Not on file      Social History Narrative  . No narrative on file     Review of Systems: General: negative for chills, fever, night sweats or weight changes.  Cardiovascular: negative for chest pain, dyspnea on exertion, edema, orthopnea, palpitations, paroxysmal nocturnal dyspnea or shortness of breath Dermatological: negative for rash Respiratory: negative for cough or wheezing Urologic: negative for hematuria Abdominal: negative for nausea, vomiting, diarrhea, bright red blood per rectum, melena, or hematemesis Neurologic: negative for visual changes, syncope, or dizziness All other systems reviewed and are otherwise negative except  as noted above.    Blood pressure (!) 146/76, pulse 84, height 5\' 10"  (1.778 m), weight 296 lb (134.3 kg).  General appearance: alert and no distress Neck: no adenopathy, no carotid bruit, no JVD, supple, symmetrical, trachea midline and thyroid not enlarged, symmetric, no tenderness/mass/nodules Lungs: clear to auscultation bilaterally Heart: regular rate and rhythm, S1, S2 normal, no murmur, click, rub or gallop Extremities: extremities normal, atraumatic, no cyanosis or edema Pulses: Diminished pedal pulses Skin: Skin color, texture, turgor normal. No rashes or lesions Neurologic: Alert and oriented X 3, normal strength and tone. Normal symmetric reflexes. Normal coordination and gait  EKG not performed today  ASSESSMENT AND PLAN:   Claudication in peripheral vascular disease [facility]) Mr. Harr has life limiting claudication with positive risk factors and recent Dopplers performed a lateral that showed SFA and tibial vessel disease. He has diminished pedal pulses. A repeat his lower extremity lateral Doppler studies and based on this plan angiography and endovascular therapy. He will need to stop his Eliquis 3 days prior to the procedure. He presents today for angiography and potential endovascular therapy for lifestyle limiting claudication.      Lorretta Harp, M.D., [place], [facility], [place] [address] [place], [place]  [address]  [phone] 04/01/2017 8:48 AM

## 2017-04-01 NOTE — Care Management Note (Signed)
Case Management Note  Patient Details  Name: Mortimer Bair MRN: [id number] Date of Birth: [birth year]  Subjective/Objective:   From home, s/p pv intervention ,will be on plavix.                 Action/Plan: NCM will follow for dc needs.   Expected Discharge Date:  04/02/17               Expected Discharge Plan:  Home/Self Care  In-House Referral:     Discharge planning Services  CM Consult  Post Acute Care Choice:    Choice offered to:     DME Arranged:    DME Agency:     HH Arranged:    HH Agency:     Status of Service:  Completed, signed off  If discussed at [employer] of Stay Meetings, dates discussed:    Additional Comments:  Zenon Mayo, RN 04/01/2017, 2:43 PM

## 2017-04-01 NOTE — Progress Notes (Signed)
7 FR sheath removed from right groin. Manual pressure held x 45 minutes due to continued oozing of the site. Groin is a level zero at this time with slight bruising. Site was dressed appropriately and the patient was given instructions at this time. Distal pulses were heard with doppler on the right.

## 2017-04-02 ENCOUNTER — Ambulatory Visit: Payer: 59 | Admitting: Cardiology

## 2017-04-02 DIAGNOSIS — I2510 Atherosclerotic heart disease of native coronary artery without angina pectoris: Secondary | ICD-10-CM

## 2017-04-02 DIAGNOSIS — I739 Peripheral vascular disease, unspecified: Secondary | ICD-10-CM

## 2017-04-02 DIAGNOSIS — I480 Paroxysmal atrial fibrillation: Secondary | ICD-10-CM

## 2017-04-02 DIAGNOSIS — E785 Hyperlipidemia, unspecified: Secondary | ICD-10-CM | POA: Diagnosis not present

## 2017-04-02 DIAGNOSIS — E1151 Type 2 diabetes mellitus with diabetic peripheral angiopathy without gangrene: Secondary | ICD-10-CM | POA: Diagnosis not present

## 2017-04-02 LAB — BASIC METABOLIC PANEL
ANION GAP: 7 (ref 5–15)
BUN: 7 mg/dL (ref 6–20)
CALCIUM: 9.3 mg/dL (ref 8.9–10.3)
CO2: 25 mmol/L (ref 22–32)
Chloride: 104 mmol/L (ref 101–111)
Creatinine, Ser: 0.65 mg/dL (ref 0.61–1.24)
Glucose, Bld: 202 mg/dL — ABNORMAL HIGH (ref 65–99)
POTASSIUM: 4.1 mmol/L (ref 3.5–5.1)
SODIUM: 136 mmol/L (ref 135–145)

## 2017-04-02 LAB — CBC
HEMATOCRIT: 33.5 % — AB (ref 39.0–52.0)
HEMOGLOBIN: 11.2 g/dL — AB (ref 13.0–17.0)
MCH: 28.6 pg (ref 26.0–34.0)
MCHC: 33.4 g/dL (ref 30.0–36.0)
MCV: 85.7 fL (ref 78.0–100.0)
PLATELETS: 229 10*3/uL (ref 150–400)
RBC: 3.91 MIL/uL — AB (ref 4.22–5.81)
RDW: 14.4 % (ref 11.5–15.5)
WBC: 10.2 10*3/uL (ref 4.0–10.5)

## 2017-04-02 LAB — GLUCOSE, CAPILLARY: Glucose-Capillary: 188 mg/dL — ABNORMAL HIGH (ref 65–99)

## 2017-04-02 NOTE — Discharge Summary (Signed)
Discharge Summary    Patient ID: [name] [name],  MRN: [id number], DOB/AGE: Oct 12, 1955 61 y.o.  Admit date: 04/01/2017 Discharge date: 04/02/2017  Primary Care Provider: Lorella Nimrod Primary Cardiologist: Revankar/Berry   Discharge Diagnoses    Active Problems:   PAF (paroxysmal atrial fibrillation) ([place])   Claudication ([place])   Hyperlipidemia LDL goal <70   Allergies No Known Allergies  Diagnostic Studies/Procedures    PV angiogram: 04/01/17  Angiographic Data:   1: Abdominal aortogram-the distal abdominal aorta was free of significant atherosclerotic changes 2: Left lower extremity-there was a 70% distal left common iliac artery stenosis. The left SFA was occluded at its origin and reconstituted in the adductor canal by profunda femoris collaterals. It was 2 vessel runoff with occluded posterior tibial. 3: Right lower extremity-the right SFA was occluded at its origin and reconstituted in the adductor canal by profunda femoris collaterals. There was one-vessel runoff via the peroneal  IMPRESSION:Mr. Spivak has occluded SFAs bilaterally with tibial vessel disease as well. These are probably best approached surgically. He does have a hemodynamically significant left common iliac artery stenosis just before the takeoff of the hypogastric artery which we will perform PTA and stenting on.  Final Impression: Successful PTA and balloon expandable stenting of the distal left common iliac artery with a 7 mm x 19 mm long balloon expandable stent postdilated with an 8 mm balloon. The patient has occluded SFAs bilaterally which will need to be treated surgically versus conservative medical therapy. The sheath will be removed and pressure held once the ACT falls below 170. He will be gently hydrated and discharged on the morning on dual antiplatelet therapy (aspirin and Plavix). Eliquis can be restarted on Monday.  Quay Burow. MD, [facility] _____________   History of Present Illness     61 yo male with PMH of hypertension, hyperlipidemia, obesity and diabetes who was referred to Dr. Gwenlyn Found for peripheral vascular evaluation for lifestyle limiting claudication.  He was seen in the office on 03/26/17 with reported symptoms.  Recent Dopplers performed showed SFA and tibial disease.  He was noted to have diminished pedal pulses.  Repeat lower extremity Dopplers were performed, and he was referred for outpatient PV angiogram.  His Eliquis was held 3 days prior.  Hospital Course   He presented on 04/01/17 and underwent successful PTCA and balloon stenting of the distal left common iliac artery.  Also noted to have occluded bilateral SFAs which will be addressed at a later time, either surgically or with medical therapy.  He was gently hydrated overnight.  Plan will be for dual antiplatelet therapy with aspirin and Plavix.  We will plan to restart his Eliquis on Monday, with plans to stop aspirin after 1 month.  Labs showed stable creatinine 0.65, hemoglobin 11.2.  No complications noted overnight.  Was able to ambulate in the hallways without any difficulty.   General: Pleasant obese male appearing in no acute distress. Head: Normocephalic, atraumatic.  Neck: Supple without bruits, JVD. Lungs:  Resp regular and unlabored, CTA. Heart: RRR, S1, S2, no S3, S4, or murmur; no rub. Abdomen: Soft, non-tender, non-distended with normoactive bowel sounds. No hepatomegaly. No rebound/guarding. No obvious abdominal masses. Extremities: No clubbing, cyanosis, edema. Distal pedal pulses are 2+ bilaterally. R femoral cath site stable with bruising but no hematoma.  Neuro: Alert and oriented X 3. Moves all extremities spontaneously. Psych: Normal affect.  [name] [name] was seen by Dr. Claiborne Billings and determined stable for discharge home. Follow up in the  office has been arranged. Medications are listed below.   _____________  Discharge Vitals Blood pressure (!) 162/57, pulse 92, temperature 98.8 F (37.1  C), temperature source Oral, resp. rate 17, height 5\' 10"  (1.778 m), weight 298 lb 11.6 oz (135.5 kg), SpO2 97 %.  Filed Weights   04/01/17 0709 04/02/17 0639  Weight: 300 lb (136.1 kg) 298 lb 11.6 oz (135.5 kg)    Labs & Radiologic Studies    CBC Recent Labs    04/02/17 0430  WBC 10.2  HGB 11.2*  HCT 33.5*  MCV 85.7  PLT 465   Basic Metabolic Panel Recent Labs    04/02/17 0430  NA 136  K 4.1  CL 104  CO2 25  GLUCOSE 202*  BUN 7  CREATININE 0.65  CALCIUM 9.3    Lipid Panel  No results found for: CHOL, TRIG, HDL, CHOLHDL, VLDL, LDLCALC, LDLDIRECT  Liver Function Tests No results for input(s): AST, ALT, ALKPHOS, BILITOT, PROT, ALBUMIN in the last 72 hours. No results for input(s): LIPASE, AMYLASE in the last 72 hours. Cardiac Enzymes No results for input(s): CKTOTAL, CKMB, CKMBINDEX, TROPONINI in the last 72 hours. BNP Invalid input(s): POCBNP D-Dimer No results for input(s): DDIMER in the last 72 hours. Hemoglobin A1C No results for input(s): HGBA1C in the last 72 hours. Fasting Lipid Panel No results for input(s): CHOL, HDL, LDLCALC, TRIG, CHOLHDL, LDLDIRECT in the last 72 hours. Thyroid Function Tests No results for input(s): TSH, T4TOTAL, T3FREE, THYROIDAB in the last 72 hours.  Invalid input(s): FREET3 _____________  No results found. Disposition   Pt is being discharged home today in good condition.  Follow-up Plans & Appointments    Follow-up Information    [facility] Follow up on 04/08/2017.   Specialty:  Cardiology Why:  at 3:30pm for your follow up dopplers Contact information: [address] [place] [phone]       Lorretta Harp, MD Follow up on 04/14/2017.   Specialties:  Cardiology, Radiology Why:  at 11:30am for your follow up appt.  Contact information: [name] [place] [address] [phone]          Discharge Instructions    Call MD  for:  redness, tenderness, or signs of infection (pain, swelling, redness, odor or green/yellow discharge around incision site)   Complete by:  As directed    Diet - low sodium heart healthy   Complete by:  As directed    Discharge instructions   Complete by:  As directed    Groin Site Care Refer to this sheet in the next few weeks. These instructions provide you with information on caring for yourself after your procedure. Your caregiver may also give you more specific instructions. Your treatment has been planned according to current medical practices, but problems sometimes occur. Call your caregiver if you have any problems or questions after your procedure. HOME CARE INSTRUCTIONS You may shower 24 hours after the procedure. Remove the bandage (dressing) and gently wash the site with plain soap and water. Gently pat the site dry.  Do not apply powder or lotion to the site.  Do not sit in a bathtub, swimming pool, or whirlpool for 5 to 7 days.  No bending, squatting, or lifting anything over 10 pounds (4.5 kg) as directed by your caregiver.  Inspect the site at least twice daily.  Do not drive home if you are discharged the same day of the procedure. Have someone  else drive you.  You may drive 24 hours after the procedure unless otherwise instructed by your caregiver.  What to expect: Any bruising will usually fade within 1 to 2 weeks.  Blood that collects in the tissue (hematoma) may be painful to the touch. It should usually decrease in size and tenderness within 1 to 2 weeks.  SEEK IMMEDIATE MEDICAL CARE IF: You have unusual pain at the groin site or down the affected leg.  You have redness, warmth, swelling, or pain at the groin site.  You have drainage (other than a small amount of blood on the dressing).  You have chills.  You have a fever or persistent symptoms for more than 72 hours.  You have a fever and your symptoms suddenly get worse.  Your leg becomes pale, cool, tingly, or  numb.  You have heavy bleeding from the site. Hold pressure on the site. Marland Kitchen  PLEASE DO NOT MISS ANY DOSES OF YOUR PLAVIX!!!!! Also keep a log of you blood pressures and bring back to your follow up appt. Please call the office with any questions.   Patients taking blood thinners should generally stay away from medicines like ibuprofen, Advil, Motrin, naproxen, and Aleve due to risk of stomach bleeding. You may take Tylenol as directed or talk to your primary doctor about alternatives.  DO NOT RESTART Geraldine Contras UNTIL Monday 11/12!!!! Please monitor for signs of bleeding with this medication.   Increase activity slowly   Complete by:  As directed       Discharge Medications     Medication List    TAKE these medications   apixaban 5 MG Tabs tablet Commonly known as:  ELIQUIS Take 1 tablet (5 mg total) by mouth 2 (two) times daily.   aspirin EC 81 MG tablet Take 1 tablet (81 mg total) by mouth daily.   clopidogrel 75 MG tablet Commonly known as:  PLAVIX Take 1 tablet (75 mg total) by mouth daily. What changed:  when to take this   dronedarone 400 MG tablet Commonly known as:  MULTAQ Take 1 tablet (400 mg total) by mouth 2 (two) times daily with a meal.   famotidine 20 MG tablet Commonly known as:  PEPCID One at bedtime What changed:    how much to take  how to take this  when to take this  additional instructions   irbesartan 75 MG tablet Commonly known as:  AVAPRO Take 1 tablet (75 mg total) by mouth daily.   liraglutide 18 MG/3ML Sopn Commonly known as:  VICTOZA Inject 0.2 mLs (1.2 mg total) into the skin daily at 10 pm. Start with 0.6 mg for one week, then increase to 1.2 mg daily.   metFORMIN 1000 MG tablet Commonly known as:  GLUCOPHAGE Take 1,000 mg by mouth 2 (two) times daily with a meal.   metoprolol tartrate 50 MG tablet Commonly known as:  LOPRESSOR Take 1 tablet (50 mg total) by mouth 2 (two) times daily.   pantoprazole 40 MG tablet Commonly  known as:  PROTONIX Take 1 tablet (40 mg total) by mouth daily. Take 30-60 min before first meal of the day   rosuvastatin 10 MG tablet Commonly known as:  CRESTOR Take 10 mg by mouth at bedtime.   VITAMIN B COMPLEX PO Take 1 capsule at bedtime by mouth.       Outstanding Labs/Studies   Follow up dopplers  Duration of Discharge Encounter   Greater than 30 minutes including physician time.  Signed, Reino Bellis NP-C 04/02/2017, 11:20 AM     Patient seen and examined. Agree with assessment and plan. No chest pain. Status post prior stenting with his last cardiac stent in March 2018.  He admits to one episode of atrial fibrillation for which he was started on eliquis.  There is remote history of sleep apnea when he was tested 30 years ago.  He has not been on therapy. There is significant ecchymosis at the right groin catheterization site.  Status post intervention to the left common iliac artery with PTA and balloon expandable stenting.  He has occluded SFAs bilaterally, which will need to be treated surgically versus conservative medical therapy.  Okay for discharge today.  Eliquis will be restarted per Dr. Gwenlyn Found.  Consider one month of triple drug therapy and then transition to Plavix/eliquis.    Troy Sine, MD, [facility] 04/02/2017 11:20 AM

## 2017-04-02 NOTE — Progress Notes (Signed)
Patient ambulated in hall with slow steady gait independently. No weakness, lightheadedness or dizziness. Complained of tightness in calfes bilaterally after ambulating 300 feet, stopped to rest. Groin site checked before and after ambulation with no change, bruised above femoral site, no bleeding or hematoma.

## 2017-04-02 NOTE — Progress Notes (Signed)
Patient stated he preferred to take his medication when he got home. No medication given this morning.

## 2017-04-05 ENCOUNTER — Ambulatory Visit: Payer: 59 | Admitting: Cardiology

## 2017-04-05 ENCOUNTER — Other Ambulatory Visit: Payer: Self-pay | Admitting: Cardiology

## 2017-04-05 ENCOUNTER — Encounter: Payer: Self-pay | Admitting: Cardiology

## 2017-04-05 VITALS — BP 128/68 | HR 77 | Ht 70.0 in | Wt 298.0 lb

## 2017-04-05 DIAGNOSIS — I4891 Unspecified atrial fibrillation: Secondary | ICD-10-CM | POA: Diagnosis not present

## 2017-04-05 DIAGNOSIS — I10 Essential (primary) hypertension: Secondary | ICD-10-CM | POA: Diagnosis not present

## 2017-04-05 DIAGNOSIS — E785 Hyperlipidemia, unspecified: Secondary | ICD-10-CM | POA: Diagnosis not present

## 2017-04-05 DIAGNOSIS — G4733 Obstructive sleep apnea (adult) (pediatric): Secondary | ICD-10-CM | POA: Diagnosis not present

## 2017-04-05 DIAGNOSIS — I739 Peripheral vascular disease, unspecified: Secondary | ICD-10-CM | POA: Diagnosis not present

## 2017-04-05 DIAGNOSIS — Z87891 Personal history of nicotine dependence: Secondary | ICD-10-CM | POA: Diagnosis not present

## 2017-04-05 DIAGNOSIS — I480 Paroxysmal atrial fibrillation: Secondary | ICD-10-CM

## 2017-04-05 DIAGNOSIS — E088 Diabetes mellitus due to underlying condition with unspecified complications: Secondary | ICD-10-CM

## 2017-04-05 DIAGNOSIS — I2510 Atherosclerotic heart disease of native coronary artery without angina pectoris: Secondary | ICD-10-CM | POA: Diagnosis not present

## 2017-04-05 MED ORDER — DRONEDARONE HCL 400 MG PO TABS: 400.0000 mg | ORAL_TABLET | Freq: Two times a day (BID) | ORAL | 3 refills | Status: DC

## 2017-04-05 MED ORDER — APIXABAN 5 MG PO TABS: 5.0000 mg | ORAL_TABLET | Freq: Two times a day (BID) | ORAL | 3 refills | Status: DC

## 2017-04-05 NOTE — Progress Notes (Signed)
Cardiology Office Note:    Date:  04/05/2017   ID:  [name] [name], DOB 18-May-1956, MRN [id number]  PCP:  Lorella Nimrod, MD  Cardiologist:  Jenean Lindau, MD   Referring MD: Lorella Nimrod, MD    ASSESSMENT:    1. Coronary artery disease involving native coronary artery of native heart without angina pectoris   2. Essential hypertension   3. PAF (paroxysmal atrial fibrillation) ([place])   4. Diabetes mellitus due to underlying condition with complication, without long-term current use of insulin ([place])   5. OSA (obstructive sleep apnea)   6. Claudication ([place])   7. History of smoking   8. Hyperlipidemia LDL goal <70   9. Morbid obesity due to excess calories ([place])   10. Atrial fibrillation with RVR ([facility])    PLAN:    In order of problems listed above:  1. Secondary prevention stressed to the patient.  Importance of compliance with diet and medication stressed and he vocalized understanding.  In view of his hematoma I would like to get a CBC and a Chem-7 and also fasting liver lipid which he will come back for the morning.   2. Diet was discussed with dyslipidemia, diabetes mellitus and obesity.  Risks of obesity explained he vocalized understanding and he plans to aggressively lose weight. 3. Sleep health issues were discussed 4. His blood pressure is stable 5. I discussed with the patient atrial fibrillation, disease process. Management and therapy including rate and rhythm control, anticoagulation benefits and potential risks were discussed extensively with the patient. Patient had multiple questions which were answered to patient's satisfaction.  Patient does not want to go on anticoagulation for another couple of days because of extensive bruising and I respect his wishes.  I told him to talk to his peripheral vascular disease specialist my colleague about the possibility of dropping one antiplatelet agent possibly aspirin. in view of the fact that he is on  anticoagulation. 6. Patient will be seen in follow-up appointment in 3 months or earlier if the patient has any concerns    Medication Adjustments/Labs and Tests Ordered: Current medicines are reviewed at length with the patient today.  Concerns regarding medicines are outlined above.  Orders Placed This Encounter  Procedures  . Basic metabolic panel  . CBC with Differential/Platelet  . Hepatic function panel  . Lipid panel   Meds ordered this encounter  Medications  . dronedarone (MULTAQ) 400 MG tablet    Sig: Take 1 tablet (400 mg total) 2 (two) times daily with a meal by mouth.    Dispense:  180 tablet    Refill:  3  . apixaban (ELIQUIS) 5 MG TABS tablet    Sig: Take 1 tablet (5 mg total) 2 (two) times daily by mouth.    Dispense:  180 tablet    Refill:  3     Chief Complaint  Patient presents with  . Follow-up    Peripheral vascular cath on Thursday 11/8     History of Present Illness:    [name] [name] is a 61 y.o. male.  He has known coronary artery disease, essential hypertension, morbid obesity, dyslipidemia and diabetes mellitus.  He underwent peripheral vascular angiography and intervention to his right iliac artery.  He has had a significant ecchymosis and this is getting better.  No chest pain orthopnea or PND.  At the time of my evaluation he is alert awake oriented and in no distress.  His wife accompanies him.  Past Medical History:  Diagnosis Date  . Collagen vascular disease ([place])   . Coronary artery disease   . GERD (gastroesophageal reflux disease)   . High cholesterol   . Hypertension   . OSA (obstructive sleep apnea)    "dx'd years ago; never have worn mask; mask never ordered" (04/01/2017)  . Pneumonia 2011  . Type II diabetes mellitus ([place]) 07/2016    Past Surgical History:  Procedure Laterality Date  . CORONARY ANGIOPLASTY WITH STENT PLACEMENT  2011; 07/2016   s/p PCI    Current Medications: Current Meds  Medication Sig  . aspirin EC 81  MG tablet Take 1 tablet (81 mg total) by mouth daily.  . B Complex Vitamins (VITAMIN B COMPLEX PO) Take 1 capsule at bedtime by mouth.   . clopidogrel (PLAVIX) 75 MG tablet Take 1 tablet (75 mg total) by mouth daily. (Patient taking differently: Take 75 mg every evening by mouth. )  . dronedarone (MULTAQ) 400 MG tablet Take 1 tablet (400 mg total) 2 (two) times daily with a meal by mouth.  . famotidine (PEPCID) 20 MG tablet One at bedtime (Patient taking differently: Take 20 mg at bedtime by mouth. One at bedtime)  . irbesartan (AVAPRO) 75 MG tablet Take 1 tablet (75 mg total) by mouth daily.  Marland Kitchen liraglutide (VICTOZA) 18 MG/3ML SOPN Inject 0.2 mLs (1.2 mg total) into the skin daily at 10 pm. Start with 0.6 mg for one week, then increase to 1.2 mg daily.  . metFORMIN (GLUCOPHAGE) 1000 MG tablet Take 1,000 mg by mouth 2 (two) times daily with a meal.  . metoprolol tartrate (LOPRESSOR) 50 MG tablet Take 1 tablet (50 mg total) by mouth 2 (two) times daily.  . pantoprazole (PROTONIX) 40 MG tablet Take 1 tablet (40 mg total) by mouth daily. Take 30-60 min before first meal of the day  . rosuvastatin (CRESTOR) 10 MG tablet Take 10 mg by mouth at bedtime.  . [DISCONTINUED] dronedarone (MULTAQ) 400 MG tablet Take 1 tablet (400 mg total) by mouth 2 (two) times daily with a meal.     Allergies:   Patient has no known allergies.   Social History   Socioeconomic History  . Marital status: Divorced    Spouse name: None  . Number of children: None  . Years of education: None  . Highest education level: None  Social Needs  . Financial resource strain: None  . Food insecurity - worry: None  . Food insecurity - inability: None  . Transportation needs - medical: None  . Transportation needs - non-medical: None  Occupational History  . None  Tobacco Use  . Smoking status: Former Smoker    Packs/day: 1.00    Years: 40.00    Pack years: 40.00    Types: Cigarettes    Last attempt to quit: 2014     Years since quitting: 4.8  . Smokeless tobacco: Never Used  Substance and Sexual Activity  . Alcohol use: Yes    Comment: 04/01/2017 "couple drinks once/month"  . Drug use: No    Comment: marijuana in the 70s  . Sexual activity: Not Currently  Other Topics Concern  . None  Social History Narrative  . None     Family History: The patient's family history includes Alzheimer's disease in his mother; Breast cancer in his sister.  ROS:   Please see the history of present illness.    All other systems reviewed and are negative.  EKGs/Labs/Other Studies Reviewed:    The following studies  were reviewed today: I discussed the findings of the peripheral vascular angiography and stenting with the patient had extensive length and questions were answered to her satisfaction.   Recent Labs: 02/22/2017: ALT 23; TSH 2.720 04/02/2017: BUN 7; Creatinine, Ser 0.65; Hemoglobin 11.2; Platelets 229; Potassium 4.1; Sodium 136  Recent Lipid Panel No results found for: CHOL, TRIG, HDL, CHOLHDL, VLDL, LDLCALC, LDLDIRECT  Physical Exam:    VS:  BP 128/68 (BP Location: Left Arm, Patient Position: Sitting)   Pulse 77   Ht 5\' 10"  (1.778 m)   Wt 298 lb (135.2 kg)   SpO2 98%   BMI 42.76 kg/m     Wt Readings from Last 3 Encounters:  04/05/17 298 lb (135.2 kg)  04/02/17 298 lb 11.6 oz (135.5 kg)  03/26/17 300 lb (136.1 kg)     GEN: Patient is in no acute distress HEENT: Normal NECK: No JVD; No carotid bruits LYMPHATICS: No lymphadenopathy CARDIAC: Hear sounds regular, 2/6 systolic murmur at the apex. RESPIRATORY:  Clear to auscultation without rales, wheezing or rhonchi  ABDOMEN: Soft, non-tender, non-distended MUSCULOSKELETAL:  No edema; No deformity  SKIN: Warm and dry NEUROLOGIC:  Alert and oriented x 3 PSYCHIATRIC:  Normal affect   Signed, Jenean Lindau, MD  04/05/2017 12:36 PM    [facility]

## 2017-04-05 NOTE — Patient Instructions (Signed)
Medication Instructions:  Your physician recommends that you continue on your current medications as directed. Please refer to the Current Medication list given to you today.   Labwork: Your physician recommends that you have the following labs drawn: CBC, BMP, and liver/lipid tomorrow   Testing/Procedures: None  Follow-Up: Your physician recommends that you schedule a follow-up appointment in: 3 months   Any Other Special Instructions Will Be Listed Below (If Applicable).     If you need a refill on your cardiac medications before your next appointment, please call your pharmacy.   [place], RN, BSN

## 2017-04-06 ENCOUNTER — Other Ambulatory Visit (INDEPENDENT_AMBULATORY_CARE_PROVIDER_SITE_OTHER): Payer: 59

## 2017-04-06 DIAGNOSIS — I2510 Atherosclerotic heart disease of native coronary artery without angina pectoris: Secondary | ICD-10-CM | POA: Diagnosis not present

## 2017-04-07 ENCOUNTER — Other Ambulatory Visit: Payer: Self-pay

## 2017-04-07 DIAGNOSIS — E785 Hyperlipidemia, unspecified: Secondary | ICD-10-CM

## 2017-04-07 LAB — HEPATIC FUNCTION PANEL
ALBUMIN: 3.9 g/dL (ref 3.6–4.8)
ALK PHOS: 73 IU/L (ref 39–117)
ALT: 18 IU/L (ref 0–44)
AST: 15 IU/L (ref 0–40)
Bilirubin Total: 0.9 mg/dL (ref 0.0–1.2)
Bilirubin, Direct: 0.23 mg/dL (ref 0.00–0.40)
Total Protein: 7 g/dL (ref 6.0–8.5)

## 2017-04-07 LAB — BASIC METABOLIC PANEL
BUN / CREAT RATIO: 19 (ref 10–24)
BUN: 13 mg/dL (ref 8–27)
CO2: 23 mmol/L (ref 20–29)
CREATININE: 0.7 mg/dL — AB (ref 0.76–1.27)
Calcium: 10 mg/dL (ref 8.6–10.2)
Chloride: 98 mmol/L (ref 96–106)
GFR, EST AFRICAN AMERICAN: 119 mL/min/{1.73_m2} (ref >59)
GFR, EST NON AFRICAN AMERICAN: 103 mL/min/{1.73_m2} (ref >59)
Glucose: 188 mg/dL — ABNORMAL HIGH (ref 65–99)
Potassium: 4.7 mmol/L (ref 3.5–5.2)
SODIUM: 136 mmol/L (ref 134–144)

## 2017-04-07 LAB — CBC WITH DIFFERENTIAL/PLATELET
BASOS: 1 %
Basophils Absolute: 0.1 10*3/uL (ref 0.0–0.2)
EOS (ABSOLUTE): 1.1 10*3/uL — ABNORMAL HIGH (ref 0.0–0.4)
EOS: 11 %
HEMATOCRIT: 35 % — AB (ref 37.5–51.0)
HEMOGLOBIN: 11.3 g/dL — AB (ref 13.0–17.7)
Immature Grans (Abs): 0.1 10*3/uL (ref 0.0–0.1)
Immature Granulocytes: 1 %
LYMPHS ABS: 1.7 10*3/uL (ref 0.7–3.1)
Lymphs: 16 %
MCH: 28.8 pg (ref 26.6–33.0)
MCHC: 32.3 g/dL (ref 31.5–35.7)
MCV: 89 fL (ref 79–97)
Monocytes Absolute: 0.5 10*3/uL (ref 0.1–0.9)
Monocytes: 4 %
NEUTROS ABS: 7.2 10*3/uL — AB (ref 1.4–7.0)
Neutrophils: 67 %
Platelets: 312 10*3/uL (ref 150–379)
RBC: 3.92 x10E6/uL — AB (ref 4.14–5.80)
RDW: 14.3 % (ref 12.3–15.4)
WBC: 10.6 10*3/uL (ref 3.4–10.8)

## 2017-04-07 LAB — LIPID PANEL
CHOL/HDL RATIO: 3.6 ratio (ref 0.0–5.0)
Cholesterol, Total: 82 mg/dL — ABNORMAL LOW (ref 100–199)
HDL: 23 mg/dL — ABNORMAL LOW (ref >39)
LDL Calculated: 12 mg/dL (ref 0–99)
TRIGLYCERIDES: 237 mg/dL — AB (ref 0–149)
VLDL Cholesterol Cal: 47 mg/dL — ABNORMAL HIGH (ref 5–40)

## 2017-04-07 MED ORDER — FENOFIBRATE 160 MG PO TABS: 160.0000 mg | ORAL_TABLET | Freq: Every day | ORAL | 0 refills | Status: DC

## 2017-04-08 ENCOUNTER — Ambulatory Visit (HOSPITAL_COMMUNITY)
Admission: RE | Admit: 2017-04-08 | Discharge: 2017-04-08 | Disposition: A | Discharge status: Home / Self Care | Payer: 59 | Source: Ambulatory Visit | Attending: Cardiovascular Disease | Admitting: Cardiovascular Disease

## 2017-04-08 ENCOUNTER — Ambulatory Visit (HOSPITAL_BASED_OUTPATIENT_CLINIC_OR_DEPARTMENT_OTHER)
Admission: RE | Admit: 2017-04-08 | Discharge: 2017-04-08 | Disposition: A | Discharge status: Home / Self Care | Payer: 59 | Source: Ambulatory Visit | Attending: Cardiovascular Disease | Admitting: Cardiovascular Disease

## 2017-04-08 DIAGNOSIS — I2510 Atherosclerotic heart disease of native coronary artery without angina pectoris: Secondary | ICD-10-CM | POA: Diagnosis not present

## 2017-04-08 DIAGNOSIS — Z9889 Other specified postprocedural states: Secondary | ICD-10-CM | POA: Insufficient documentation

## 2017-04-08 DIAGNOSIS — E1151 Type 2 diabetes mellitus with diabetic peripheral angiopathy without gangrene: Secondary | ICD-10-CM | POA: Diagnosis not present

## 2017-04-08 DIAGNOSIS — Z87891 Personal history of nicotine dependence: Secondary | ICD-10-CM | POA: Insufficient documentation

## 2017-04-08 DIAGNOSIS — I10 Essential (primary) hypertension: Secondary | ICD-10-CM | POA: Insufficient documentation

## 2017-04-08 DIAGNOSIS — E785 Hyperlipidemia, unspecified: Secondary | ICD-10-CM | POA: Insufficient documentation

## 2017-04-08 DIAGNOSIS — I739 Peripheral vascular disease, unspecified: Secondary | ICD-10-CM | POA: Diagnosis present

## 2017-04-08 DIAGNOSIS — E669 Obesity, unspecified: Secondary | ICD-10-CM | POA: Diagnosis not present

## 2017-04-08 DIAGNOSIS — Z6841 Body Mass Index (BMI) 40.0 and over, adult: Secondary | ICD-10-CM | POA: Diagnosis not present

## 2017-04-09 ENCOUNTER — Other Ambulatory Visit: Payer: Self-pay | Admitting: Cardiovascular Disease

## 2017-04-09 DIAGNOSIS — I739 Peripheral vascular disease, unspecified: Secondary | ICD-10-CM

## 2017-04-14 ENCOUNTER — Ambulatory Visit: Payer: 59 | Admitting: Cardiovascular Disease

## 2017-04-14 ENCOUNTER — Encounter: Payer: Self-pay | Admitting: Cardiovascular Disease

## 2017-04-14 VITALS — BP 118/60 | HR 93 | Ht 70.0 in | Wt 298.2 lb

## 2017-04-14 DIAGNOSIS — I739 Peripheral vascular disease, unspecified: Secondary | ICD-10-CM

## 2017-04-14 NOTE — Progress Notes (Signed)
04/14/2017 [name] [name]   [birth year]  [id number]  Primary Physician Lorella Nimrod, MD Primary Cardiologist: Lorretta Harp MD Lupe Carney, [place]  HPI:  [name] [name] is a 61 y.o. male divorced with 3 children who was referred by Dr. Wilhemina Bonito peripheral vascular evaluation because of lifestyle limiting claudication. He does have a history of obesity, treated hypertension, hyperlipidemia and diabetes. He quit smoking in 2014 after smoking 40 pack years. 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 He is in multiple airports and has noticed discomfort in his legs when he walks. He does have a history of ischemic heart disease with stents placed in 2010 and as recently as March 2018. His Effient was changed to Plavix when he had an episode of hemoptysis over the exact etiology was unclear. He has obstructive sleep apnea not on CPAP. He apparently had syncope 2 weeks ago in [place] and has had a recent diagnosis of atrial fibrillation currently in sinus rhythm.  he had lower extremity or to Doppler study this that showed occluded SFAs bilaterally. I performed peripheral angiography on him 04/01/17 revealed occluded SFAs and the origin down to the adductor canal bilaterally with one-vessel runoff on the right and left. He did have a 70% distal left common iliac artery stenosis which I stented his to Doppler showed improvement on the left. He is not walked enough to notice any clinical improvement however. He did have a significant bruise on the right side which is slowly resolving.   Current Meds  Medication Sig  . apixaban (ELIQUIS) 5 MG TABS tablet Take 1 tablet (5 mg total) 2 (two) times daily by mouth.  Marland Kitchen aspirin EC 81 MG tablet Take 1 tablet (81 mg total) by mouth daily.  . B Complex Vitamins (VITAMIN B COMPLEX PO) Take 1 capsule at bedtime by mouth.   . clopidogrel (PLAVIX) 75 MG tablet Take 1 tablet (75 mg total) by mouth daily. (Patient  taking differently: Take 75 mg every evening by mouth. )  . dronedarone (MULTAQ) 400 MG tablet Take 1 tablet (400 mg total) 2 (two) times daily with a meal by mouth.  . famotidine (PEPCID) 20 MG tablet One at bedtime (Patient taking differently: Take 20 mg at bedtime by mouth. One at bedtime)  . fenofibrate 160 MG tablet Take 1 tablet (160 mg total) daily by mouth.  . irbesartan (AVAPRO) 75 MG tablet Take 1 tablet (75 mg total) by mouth daily.  Marland Kitchen liraglutide (VICTOZA) 18 MG/3ML SOPN Inject 0.2 mLs (1.2 mg total) into the skin daily at 10 pm. Start with 0.6 mg for one week, then increase to 1.2 mg daily.  . metFORMIN (GLUCOPHAGE) 1000 MG tablet Take 1,000 mg by mouth 2 (two) times daily with a meal.  . metoprolol tartrate (LOPRESSOR) 50 MG tablet Take 1 tablet (50 mg total) by mouth 2 (two) times daily.  . pantoprazole (PROTONIX) 40 MG tablet Take 1 tablet (40 mg total) by mouth daily. Take 30-60 min before first meal of the day  . rosuvastatin (CRESTOR) 10 MG tablet Take 10 mg by mouth at bedtime.     No Known Allergies  Social History   Socioeconomic History  . Marital status: Divorced    Spouse name: Not on file  . Number of children: Not on file  . Years of education: Not on file  . Highest education level: Not on file  Social Needs  . Financial resource strain:  Not on file  . Food insecurity - worry: Not on file  . Food insecurity - inability: Not on file  . Transportation needs - medical: Not on file  . Transportation needs - non-medical: Not on file  Occupational History  . Not on file  Tobacco Use  . Smoking status: Former Smoker    Packs/day: 1.00    Years: 40.00    Pack years: 40.00    Types: Cigarettes    Last attempt to quit: 2014    Years since quitting: 4.8  . Smokeless tobacco: Never Used  Substance and Sexual Activity  . Alcohol use: Yes    Comment: 04/01/2017 "couple drinks once/month"  . Drug use: No    Comment: marijuana in the 70s  . Sexual activity: Not  Currently  Other Topics Concern  . Not on file  Social History Narrative  . Not on file     Review of Systems: General: negative for chills, fever, night sweats or weight changes.  Cardiovascular: negative for chest pain, dyspnea on exertion, edema, orthopnea, palpitations, paroxysmal nocturnal dyspnea or shortness of breath Dermatological: negative for rash Respiratory: negative for cough or wheezing Urologic: negative for hematuria Abdominal: negative for nausea, vomiting, diarrhea, bright red blood per rectum, melena, or hematemesis Neurologic: negative for visual changes, syncope, or dizziness All other systems reviewed and are otherwise negative except as noted above.    Blood pressure 118/60, pulse 93, height 5\' 10"  (1.778 m), weight 298 lb 3.2 oz (135.3 kg).  General appearance: alert and no distress Neck: no adenopathy, no carotid bruit, no JVD, supple, symmetrical, trachea midline and thyroid not enlarged, symmetric, no tenderness/mass/nodules Lungs: clear to auscultation bilaterally Heart: regular rate and rhythm, S1, S2 normal, no murmur, click, rub or gallop Extremities: Resolving ecchymosis right groin and inner thigh Pulses: Absent pedal pulses bilaterally Skin: Ecchymosis right groin and thigh Neurologic: Alert and oriented X 3, normal strength and tone. Normal symmetric reflexes. Normal coordination and gait  EKG atrial fibrillation with ventricular response of 93 and regular branch block. I personally reviewed this EKG.  ASSESSMENT AND PLAN:   Claudication in peripheral vascular disease [facility]) Mr. Chason returns for postop follow-up. Perform peripheral angiography on him 04/01/17 stenting his distal left common iliac artery with a 7 mm x 19 mm long stent. He does have occluded SFAs from the origin bilaterally down to the adductor canal with one-vessel runoff on the right. The peroneal and 2 vessels on the left. He is on Eliquis and Plavix. He is not sure whether he is  improved yet since he has not really walked. Did have a large bruise on his right groin which is slowly resolving previous Dopplers improved. The remainder of his PAD is only surgically revascularizable. I will see him back as needed.      Lorretta Harp MD FACP,FACC,FAHA, [facility] 04/14/2017 12:33 PM

## 2017-04-14 NOTE — Assessment & Plan Note (Signed)
Mr. Nicolson returns for postop follow-up. Perform peripheral angiography on him 04/01/17 stenting his distal left common iliac artery with a 7 mm x 19 mm long stent. He does have occluded SFAs from the origin bilaterally down to the adductor canal with one-vessel runoff on the right. The peroneal and 2 vessels on the left. He is on Eliquis and Plavix. He is not sure whether he is improved yet since he has not really walked. Did have a large bruise on his right groin which is slowly resolving previous Dopplers improved. The remainder of his PAD is only surgically revascularizable. I will see him back as needed.

## 2017-04-14 NOTE — Patient Instructions (Signed)
Your physician recommends that you schedule a follow-up appointment in: AS NEEDED  

## 2017-04-16 ENCOUNTER — Other Ambulatory Visit: Payer: Self-pay | Admitting: Internal Medicine

## 2017-04-16 DIAGNOSIS — I4891 Unspecified atrial fibrillation: Secondary | ICD-10-CM

## 2017-04-18 ENCOUNTER — Other Ambulatory Visit: Payer: Self-pay | Admitting: Internal Medicine

## 2017-04-18 ENCOUNTER — Encounter: Payer: Self-pay | Admitting: Cardiology

## 2017-04-19 ENCOUNTER — Other Ambulatory Visit: Payer: Self-pay

## 2017-04-19 ENCOUNTER — Other Ambulatory Visit: Payer: Self-pay | Admitting: Internal Medicine

## 2017-04-19 DIAGNOSIS — I4891 Unspecified atrial fibrillation: Secondary | ICD-10-CM

## 2017-04-19 MED ORDER — METOPROLOL TARTRATE 50 MG PO TABS: 50.0000 mg | ORAL_TABLET | Freq: Two times a day (BID) | ORAL | 2 refills | Status: DC

## 2017-05-12 ENCOUNTER — Other Ambulatory Visit: Payer: Self-pay | Admitting: Cardiovascular Disease

## 2017-05-12 DIAGNOSIS — I739 Peripheral vascular disease, unspecified: Secondary | ICD-10-CM

## 2017-05-17 ENCOUNTER — Encounter: Payer: Self-pay | Admitting: Cardiovascular Disease

## 2017-05-17 ENCOUNTER — Other Ambulatory Visit: Payer: Self-pay | Admitting: Internal Medicine

## 2017-05-17 DIAGNOSIS — I4891 Unspecified atrial fibrillation: Secondary | ICD-10-CM

## 2017-05-31 ENCOUNTER — Other Ambulatory Visit: Payer: Self-pay | Admitting: Internal Medicine

## 2017-06-05 ENCOUNTER — Other Ambulatory Visit: Payer: Self-pay | Admitting: Cardiology

## 2017-06-14 ENCOUNTER — Telehealth: Payer: Self-pay | Admitting: Cardiology

## 2017-06-14 ENCOUNTER — Other Ambulatory Visit: Payer: Self-pay

## 2017-06-14 MED ORDER — FENOFIBRATE 160 MG PO TABS: 160.0000 mg | ORAL_TABLET | Freq: Every day | ORAL | 1 refills | Status: DC

## 2017-06-14 NOTE — Telephone Encounter (Signed)
Patient states that he is unable to get his Losartin filled due to lack of meds at pharmacy, he was part of the recall and he needs to know what to do, he has some losartin potassium at home but does not know if he should use?? aslo he needs a refill on his  Phenofibrate 160 mg take 1 daily and please call to the [facility] on [address].. Please calll him.

## 2017-06-14 NOTE — Telephone Encounter (Signed)
Medication subtitute was approved by Dr. Geraldo Pitter. Med will be changed back once stock is replenished. Med refill sent for fenofibrate.

## 2017-06-23 ENCOUNTER — Other Ambulatory Visit: Payer: Self-pay | Admitting: *Deleted

## 2017-06-23 DIAGNOSIS — E119 Type 2 diabetes mellitus without complications: Secondary | ICD-10-CM

## 2017-06-25 MED ORDER — LIRAGLUTIDE 18 MG/3ML ~~LOC~~ SOPN: 1.2000 mg | PEN_INJECTOR | Freq: Every day | SUBCUTANEOUS | 2 refills | Status: DC

## 2017-07-09 ENCOUNTER — Other Ambulatory Visit: Payer: Self-pay | Admitting: Internal Medicine

## 2017-07-28 ENCOUNTER — Other Ambulatory Visit: Payer: Self-pay | Admitting: Internal Medicine

## 2017-08-09 ENCOUNTER — Other Ambulatory Visit: Payer: Self-pay | Admitting: Cardiology

## 2017-08-10 LAB — HEPATIC FUNCTION PANEL
ALT: 25 IU/L (ref 0–44)
AST: 20 IU/L (ref 0–40)
Albumin: 3.9 g/dL (ref 3.6–4.8)
Alkaline Phosphatase: 50 IU/L (ref 39–117)
Bilirubin Total: 0.7 mg/dL (ref 0.0–1.2)
Bilirubin, Direct: 0.17 mg/dL (ref 0.00–0.40)
Total Protein: 7.1 g/dL (ref 6.0–8.5)

## 2017-08-10 LAB — LIPID PANEL
CHOL/HDL RATIO: 4.1 ratio (ref 0.0–5.0)
CHOLESTEROL TOTAL: 119 mg/dL (ref 100–199)
HDL: 29 mg/dL — AB (ref >39)
LDL Calculated: 30 mg/dL (ref 0–99)
TRIGLYCERIDES: 302 mg/dL — AB (ref 0–149)
VLDL CHOLESTEROL CAL: 60 mg/dL — AB (ref 5–40)

## 2017-08-10 LAB — CBC WITH DIFFERENTIAL/PLATELET
Basophils Absolute: 0 10*3/uL (ref 0.0–0.2)
Basos: 1 %
EOS (ABSOLUTE): 0.7 10*3/uL — AB (ref 0.0–0.4)
Eos: 9 %
HEMATOCRIT: 38.4 % (ref 37.5–51.0)
Hemoglobin: 12.8 g/dL — ABNORMAL LOW (ref 13.0–17.7)
IMMATURE GRANS (ABS): 0 10*3/uL (ref 0.0–0.1)
Immature Granulocytes: 0 %
Lymphocytes Absolute: 1.3 10*3/uL (ref 0.7–3.1)
Lymphs: 17 %
MCH: 29 pg (ref 26.6–33.0)
MCHC: 33.3 g/dL (ref 31.5–35.7)
MCV: 87 fL (ref 79–97)
MONOS ABS: 0.4 10*3/uL (ref 0.1–0.9)
Monocytes: 5 %
NEUTROS ABS: 5.5 10*3/uL (ref 1.4–7.0)
Neutrophils: 68 %
PLATELETS: 276 10*3/uL (ref 150–379)
RBC: 4.42 x10E6/uL (ref 4.14–5.80)
RDW: 14.7 % (ref 12.3–15.4)
WBC: 7.9 10*3/uL (ref 3.4–10.8)

## 2017-08-10 LAB — BASIC METABOLIC PANEL
BUN / CREAT RATIO: 14 (ref 10–24)
BUN: 9 mg/dL (ref 8–27)
CO2: 26 mmol/L (ref 20–29)
Calcium: 10 mg/dL (ref 8.6–10.2)
Chloride: 96 mmol/L (ref 96–106)
Creatinine, Ser: 0.65 mg/dL — ABNORMAL LOW (ref 0.76–1.27)
GFR, EST AFRICAN AMERICAN: 122 mL/min/{1.73_m2} (ref >59)
GFR, EST NON AFRICAN AMERICAN: 106 mL/min/{1.73_m2} (ref >59)
Glucose: 199 mg/dL — ABNORMAL HIGH (ref 65–99)
Potassium: 4.8 mmol/L (ref 3.5–5.2)
Sodium: 138 mmol/L (ref 134–144)

## 2017-08-13 ENCOUNTER — Other Ambulatory Visit: Payer: Self-pay

## 2017-08-13 ENCOUNTER — Encounter: Payer: Self-pay | Admitting: Cardiology

## 2017-08-13 ENCOUNTER — Ambulatory Visit: Payer: 59 | Admitting: Cardiology

## 2017-08-13 VITALS — BP 132/68 | HR 69 | Ht 70.0 in | Wt 303.0 lb

## 2017-08-13 DIAGNOSIS — I10 Essential (primary) hypertension: Secondary | ICD-10-CM | POA: Diagnosis not present

## 2017-08-13 DIAGNOSIS — E088 Diabetes mellitus due to underlying condition with unspecified complications: Secondary | ICD-10-CM | POA: Diagnosis not present

## 2017-08-13 DIAGNOSIS — E785 Hyperlipidemia, unspecified: Secondary | ICD-10-CM

## 2017-08-13 DIAGNOSIS — I480 Paroxysmal atrial fibrillation: Secondary | ICD-10-CM | POA: Diagnosis not present

## 2017-08-13 DIAGNOSIS — I2510 Atherosclerotic heart disease of native coronary artery without angina pectoris: Secondary | ICD-10-CM

## 2017-08-13 DIAGNOSIS — Z87891 Personal history of nicotine dependence: Secondary | ICD-10-CM

## 2017-08-13 DIAGNOSIS — G4733 Obstructive sleep apnea (adult) (pediatric): Secondary | ICD-10-CM | POA: Diagnosis not present

## 2017-08-13 DIAGNOSIS — I739 Peripheral vascular disease, unspecified: Secondary | ICD-10-CM

## 2017-08-13 MED ORDER — NITROGLYCERIN 0.4 MG SL SUBL: 0.4000 mg | SUBLINGUAL_TABLET | SUBLINGUAL | 11 refills | Status: DC | PRN

## 2017-08-13 MED ORDER — FISH OIL 1000 MG PO CPDR: 2.0000 g | DELAYED_RELEASE_CAPSULE | Freq: Two times a day (BID) | ORAL | 3 refills | Status: DC

## 2017-08-13 NOTE — Progress Notes (Signed)
Cardiology Office Note:    Date:  08/13/2017   ID:  [name] [name], DOB 22-Sep-1955, MRN [id number]  PCP:  Lorella Nimrod, MD  Cardiologist:  Jenean Lindau, MD   Referring MD: Lorella Nimrod, MD    ASSESSMENT:    1. Coronary artery disease involving native coronary artery of native heart without angina pectoris   2. Essential hypertension   3. PAF (paroxysmal atrial fibrillation) ([place])   4. Diabetes mellitus due to underlying condition with complication, without long-term current use of insulin ([place])   5. OSA (obstructive sleep apnea)   6. Claudication in peripheral vascular disease ([place])   7. History of smoking   8. Hyperlipidemia LDL goal <70   9. Morbid obesity due to excess calories ([place])    PLAN:    In order of problems listed above:  1. Secondary prevention stressed with the patient.  Importance of compliance with diet and medications stressed in the vocalized understanding.  His blood pressure is stable.  Diet was discussed with dyslipidemia and diabetes mellitus and obesity and risks explained.  He plans to do better from this point. 2. I mentioned to him him that he would need to walk on a regular basis to help his peripheral vascular disease and he will have to take this initiated he tells me that he is going to consider this strongly this time on board.  Risks of obesity also explained and he wants to diet and take care of this issue also. 3. He came for blood work.  The day but I do not see the reports in the computer and will explore this. 4. Patient will be seen in follow-up appointment in 6 months or earlier if the patient has any concerns 5.    Medication Adjustments/Labs and Tests Ordered: Current medicines are reviewed at length with the patient today.  Concerns regarding medicines are outlined above.  No orders of the defined types were placed in this encounter.  Meds ordered this encounter  Medications  . nitroGLYCERIN (NITROSTAT) 0.4 MG SL tablet    Sig:  Place 1 tablet (0.4 mg total) under the tongue every 5 (five) minutes as needed for chest pain.    Dispense:  25 tablet    Refill:  11     Chief Complaint  Patient presents with  . Follow-up  . Coronary Artery Disease     History of Present Illness:    [name] [name] is a 62 y.o. male.  The patient has known coronary artery disease and peripheral vascular disease.  He denies any problems at this time and takes care of activities of daily living.  No chest pain orthopnea or PND.  He has intermittent claudication but he has yet to begin an exercise program and I have told him in the past that he needs to walk on a regular basis.  Also he is significantly overweight.  He travels extensively some not sure whether he has an optimal diet.  Past Medical History:  Diagnosis Date  . Collagen vascular disease ([place])   . Coronary artery disease   . GERD (gastroesophageal reflux disease)   . High cholesterol   . Hypertension   . OSA (obstructive sleep apnea)    "dx'd years ago; never have worn mask; mask never ordered" (04/01/2017)  . Pneumonia 2011  . Type II diabetes mellitus ([place]) 07/2016    Past Surgical History:  Procedure Laterality Date  . CORONARY ANGIOPLASTY WITH STENT PLACEMENT  2011; 07/2016   s/p PCI  .  LOWER EXTREMITY ANGIOGRAPHY Bilateral 04/01/2017   Procedure: Lower Extremity Angiography;  Surgeon: Lorretta Harp, MD;  Location: [facility];  Service: Cardiovascular;  Laterality: Bilateral;  . PERIPHERAL VASCULAR INTERVENTION Left 04/01/2017   Procedure: PERIPHERAL VASCULAR INTERVENTION;  Surgeon: Lorretta Harp, MD;  Location: [facility];  Service: Cardiovascular;  Laterality: Left;  common iliac  . VIDEO BRONCHOSCOPY Bilateral 08/25/2016   Procedure: VIDEO BRONCHOSCOPY WITHOUT FLUORO;  Surgeon: Collene Gobble, MD;  Location: [facility];  Service: Cardiopulmonary;  Laterality: Bilateral;    Current Medications: Current Meds  Medication Sig  . apixaban  (ELIQUIS) 5 MG TABS tablet Take 1 tablet (5 mg total) 2 (two) times daily by mouth.  . B Complex Vitamins (VITAMIN B COMPLEX PO) Take 1 capsule at bedtime by mouth.   . clopidogrel (PLAVIX) 75 MG tablet Take 1 tablet (75 mg total) by mouth daily. (Patient taking differently: Take 75 mg every evening by mouth. )  . dronedarone (MULTAQ) 400 MG tablet Take 1 tablet (400 mg total) 2 (two) times daily with a meal by mouth.  . famotidine (PEPCID) 20 MG tablet One at bedtime (Patient taking differently: Take 20 mg at bedtime by mouth. One at bedtime)  . fenofibrate 160 MG tablet Take 1 tablet (160 mg total) by mouth daily.  . irbesartan (AVAPRO) 75 MG tablet TAKE ONE TABLET BY MOUTH ONE TIME DAILY   . liraglutide (VICTOZA) 18 MG/3ML SOPN Inject 0.2 mLs (1.2 mg total) into the skin daily at 10 pm. Start with 0.6 mg for one week, then increase to 1.2 mg daily.  . metFORMIN (GLUCOPHAGE) 1000 MG tablet Take 1,000 mg by mouth 2 (two) times daily with a meal.  . metoprolol tartrate (LOPRESSOR) 50 MG tablet Take 1 tablet (50 mg total) by mouth 2 (two) times daily.  . pantoprazole (PROTONIX) 40 MG tablet TAKE 1 TABLET BY MOUTH ONCE A DAY 30 TO 60 MINUTES BEFORE FIRST MEAL OF THE DAY   . rosuvastatin (CRESTOR) 10 MG tablet Take 10 mg by mouth at bedtime.  . [DISCONTINUED] aspirin EC 81 MG tablet Take 1 tablet (81 mg total) by mouth daily.     Allergies:   Patient has no known allergies.   Social History   Socioeconomic History  . Marital status: Divorced    Spouse name: Not on file  . Number of children: Not on file  . Years of education: Not on file  . Highest education level: Not on file  Occupational History  . Not on file  Social Needs  . Financial resource strain: Not on file  . Food insecurity:    Worry: Not on file    Inability: Not on file  . Transportation needs:    Medical: Not on file    Non-medical: Not on file  Tobacco Use  . Smoking status: Former Smoker    Packs/day: 1.00     Years: 40.00    Pack years: 40.00    Types: Cigarettes    Last attempt to quit: 2014    Years since quitting: 5.2  . Smokeless tobacco: Never Used  Substance and Sexual Activity  . Alcohol use: Yes    Comment: 04/01/2017 "couple drinks once/month"  . Drug use: No    Types: Marijuana    Comment: marijuana in the 70s  . Sexual activity: Not Currently  Lifestyle  . Physical activity:    Days per week: Not on file    Minutes per session: Not  on file  . Stress: Not on file  Relationships  . Social connections:    Talks on phone: Not on file    Gets together: Not on file    Attends religious service: Not on file    Active member of club or organization: Not on file    Attends meetings of clubs or organizations: Not on file    Relationship status: Not on file  Other Topics Concern  . Not on file  Social History Narrative  . Not on file     Family History: The patient's family history includes Alzheimer's disease in his mother; Breast cancer in his sister.  ROS:   Please see the history of present illness.    All other systems reviewed and are negative.  EKGs/Labs/Other Studies Reviewed:    The following studies were reviewed today: I reviewed records from previous evaluations including peripheral vascular work discussed with the patient.   Recent Labs: 02/22/2017: TSH 2.720 04/06/2017: ALT 18; BUN 13; Creatinine, Ser 0.70; Hemoglobin 11.3; Platelets 312; Potassium 4.7; Sodium 136  Recent Lipid Panel    Component Value Date/Time   CHOL 82 (L) 04/06/2017 0905   TRIG 237 (H) 04/06/2017 0905   HDL 23 (L) 04/06/2017 0905   CHOLHDL 3.6 04/06/2017 0905   LDLCALC 12 04/06/2017 0905    Physical Exam:    VS:  BP 132/68 (BP Location: Right Arm, Patient Position: Sitting, Cuff Size: Normal)   Pulse 69   Ht 5\' 10"  (1.778 m)   Wt (!) 303 lb (137.4 kg)   SpO2 97%   BMI 43.48 kg/m     Wt Readings from Last 3 Encounters:  08/13/17 (!) 303 lb (137.4 kg)  04/14/17 298 lb 3.2  oz (135.3 kg)  04/05/17 298 lb (135.2 kg)     GEN: Patient is in no acute distress HEENT: Normal NECK: No JVD; No carotid bruits LYMPHATICS: No lymphadenopathy CARDIAC: Hear sounds regular, 2/6 systolic murmur at the apex. RESPIRATORY:  Clear to auscultation without rales, wheezing or rhonchi  ABDOMEN: Soft, non-tender, non-distended MUSCULOSKELETAL:  No edema; No deformity  SKIN: Warm and dry NEUROLOGIC:  Alert and oriented x 3 PSYCHIATRIC:  Normal affect   Signed, Jenean Lindau, MD  08/13/2017 10:41 AM    [place]

## 2017-08-13 NOTE — Patient Instructions (Signed)
Medication Instructions:  Your physician has recommended you make the following change in your medication:  START Nitroglycerin 0.4 mg sublingual (under your tongue) as needed for chest pain. If experiencing chest pain, stop what you are doing and sit down. Take 1 nitroglycerin and wait 5 minutes. If chest pain continues, take another nitroglycerin and wait 5 minutes. If chest pain does not subside, take 1 more nitroglycerin and dial 911. You make take a total of 3 nitroglycerin in a 15 minute time frame  Labwork: None  Testing/Procedures: None  Follow-Up: Your physician recommends that you schedule a follow-up appointment in: 6 months  Any Other Special Instructions Will Be Listed Below (If Applicable).     If you need a refill on your cardiac medications before your next appointment, please call your pharmacy.   [facility]  Sheniah Supak A, RN, BSN  

## 2017-08-16 ENCOUNTER — Telehealth: Payer: Self-pay | Admitting: Cardiology

## 2017-08-16 NOTE — Telephone Encounter (Signed)
Wants lab results

## 2017-08-16 NOTE — Telephone Encounter (Signed)
Informed of labs

## 2017-08-19 ENCOUNTER — Other Ambulatory Visit: Payer: Self-pay

## 2017-08-23 ENCOUNTER — Other Ambulatory Visit: Payer: Self-pay

## 2017-08-23 MED ORDER — FISH OIL 1000 MG PO CAPS: 2.0000 | ORAL_CAPSULE | Freq: Two times a day (BID) | ORAL | 6 refills | Status: DC

## 2017-11-01 ENCOUNTER — Other Ambulatory Visit: Payer: Self-pay | Admitting: Cardiology

## 2017-11-01 DIAGNOSIS — I4891 Unspecified atrial fibrillation: Secondary | ICD-10-CM

## 2017-11-01 MED ORDER — METOPROLOL TARTRATE 50 MG PO TABS: 50.0000 mg | ORAL_TABLET | Freq: Two times a day (BID) | ORAL | 2 refills | Status: DC

## 2017-11-18 ENCOUNTER — Emergency Department (HOSPITAL_COMMUNITY): Payer: 59

## 2017-11-18 ENCOUNTER — Inpatient Hospital Stay (HOSPITAL_COMMUNITY): Payer: 59

## 2017-11-18 ENCOUNTER — Inpatient Hospital Stay (HOSPITAL_COMMUNITY): Payer: 59 | Admitting: Certified Registered Nurse Anesthetist

## 2017-11-18 ENCOUNTER — Inpatient Hospital Stay (HOSPITAL_COMMUNITY)
Admission: EM | Admit: 2017-11-18 | Discharge: 2017-12-15 | DRG: 003 | Disposition: A | Discharge status: Other Acute Inpatient Hospital | Payer: 59 | Attending: Neurology | Admitting: Neurology

## 2017-11-18 ENCOUNTER — Encounter (HOSPITAL_COMMUNITY): Payer: Self-pay

## 2017-11-18 ENCOUNTER — Encounter (HOSPITAL_COMMUNITY)
Admission: EM | Disposition: A | Discharge status: Nursing Home (Includes ICF) | Payer: Self-pay | Source: Home / Self Care | Attending: Neurology

## 2017-11-18 DIAGNOSIS — Z930 Tracheostomy status: Secondary | ICD-10-CM | POA: Diagnosis not present

## 2017-11-18 DIAGNOSIS — I481 Persistent atrial fibrillation: Secondary | ICD-10-CM | POA: Diagnosis present

## 2017-11-18 DIAGNOSIS — C73 Malignant neoplasm of thyroid gland: Secondary | ICD-10-CM | POA: Diagnosis present

## 2017-11-18 DIAGNOSIS — Y95 Nosocomial condition: Secondary | ICD-10-CM | POA: Diagnosis not present

## 2017-11-18 DIAGNOSIS — I651 Occlusion and stenosis of basilar artery: Secondary | ICD-10-CM | POA: Diagnosis present

## 2017-11-18 DIAGNOSIS — E876 Hypokalemia: Secondary | ICD-10-CM | POA: Diagnosis present

## 2017-11-18 DIAGNOSIS — E1151 Type 2 diabetes mellitus with diabetic peripheral angiopathy without gangrene: Secondary | ICD-10-CM | POA: Diagnosis present

## 2017-11-18 DIAGNOSIS — Z7901 Long term (current) use of anticoagulants: Secondary | ICD-10-CM

## 2017-11-18 DIAGNOSIS — G8250 Quadriplegia, unspecified: Secondary | ICD-10-CM | POA: Diagnosis present

## 2017-11-18 DIAGNOSIS — Z6841 Body Mass Index (BMI) 40.0 and over, adult: Secondary | ICD-10-CM

## 2017-11-18 DIAGNOSIS — E1165 Type 2 diabetes mellitus with hyperglycemia: Secondary | ICD-10-CM | POA: Diagnosis present

## 2017-11-18 DIAGNOSIS — Z430 Encounter for attention to tracheostomy: Secondary | ICD-10-CM

## 2017-11-18 DIAGNOSIS — Z9911 Dependence on respirator [ventilator] status: Secondary | ICD-10-CM

## 2017-11-18 DIAGNOSIS — I2510 Atherosclerotic heart disease of native coronary artery without angina pectoris: Secondary | ICD-10-CM | POA: Diagnosis not present

## 2017-11-18 DIAGNOSIS — I771 Stricture of artery: Secondary | ICD-10-CM | POA: Diagnosis present

## 2017-11-18 DIAGNOSIS — G835 Locked-in state: Secondary | ICD-10-CM | POA: Diagnosis present

## 2017-11-18 DIAGNOSIS — Z66 Do not resuscitate: Secondary | ICD-10-CM | POA: Diagnosis not present

## 2017-11-18 DIAGNOSIS — Z95828 Presence of other vascular implants and grafts: Secondary | ICD-10-CM

## 2017-11-18 DIAGNOSIS — Z8701 Personal history of pneumonia (recurrent): Secondary | ICD-10-CM

## 2017-11-18 DIAGNOSIS — J15212 Pneumonia due to Methicillin resistant Staphylococcus aureus: Secondary | ICD-10-CM | POA: Diagnosis not present

## 2017-11-18 DIAGNOSIS — I63411 Cerebral infarction due to embolism of right middle cerebral artery: Principal | ICD-10-CM | POA: Diagnosis present

## 2017-11-18 DIAGNOSIS — I739 Peripheral vascular disease, unspecified: Secondary | ICD-10-CM | POA: Diagnosis present

## 2017-11-18 DIAGNOSIS — R29720 NIHSS score 20: Secondary | ICD-10-CM | POA: Diagnosis not present

## 2017-11-18 DIAGNOSIS — E079 Disorder of thyroid, unspecified: Secondary | ICD-10-CM | POA: Diagnosis not present

## 2017-11-18 DIAGNOSIS — J440 Chronic obstructive pulmonary disease with acute lower respiratory infection: Secondary | ICD-10-CM | POA: Diagnosis not present

## 2017-11-18 DIAGNOSIS — I639 Cerebral infarction, unspecified: Secondary | ICD-10-CM

## 2017-11-18 DIAGNOSIS — J9600 Acute respiratory failure, unspecified whether with hypoxia or hypercapnia: Secondary | ICD-10-CM | POA: Diagnosis present

## 2017-11-18 DIAGNOSIS — A419 Sepsis, unspecified organism: Secondary | ICD-10-CM | POA: Diagnosis not present

## 2017-11-18 DIAGNOSIS — I480 Paroxysmal atrial fibrillation: Secondary | ICD-10-CM | POA: Diagnosis not present

## 2017-11-18 DIAGNOSIS — B9562 Methicillin resistant Staphylococcus aureus infection as the cause of diseases classified elsewhere: Secondary | ICD-10-CM | POA: Diagnosis present

## 2017-11-18 DIAGNOSIS — G4733 Obstructive sleep apnea (adult) (pediatric): Secondary | ICD-10-CM | POA: Diagnosis present

## 2017-11-18 DIAGNOSIS — R739 Hyperglycemia, unspecified: Secondary | ICD-10-CM | POA: Diagnosis present

## 2017-11-18 DIAGNOSIS — R29717 NIHSS score 17: Secondary | ICD-10-CM | POA: Diagnosis not present

## 2017-11-18 DIAGNOSIS — I10 Essential (primary) hypertension: Secondary | ICD-10-CM | POA: Diagnosis present

## 2017-11-18 DIAGNOSIS — R042 Hemoptysis: Secondary | ICD-10-CM | POA: Diagnosis present

## 2017-11-18 DIAGNOSIS — F419 Anxiety disorder, unspecified: Secondary | ICD-10-CM | POA: Diagnosis present

## 2017-11-18 DIAGNOSIS — Z87891 Personal history of nicotine dependence: Secondary | ICD-10-CM

## 2017-11-18 DIAGNOSIS — Z515 Encounter for palliative care: Secondary | ICD-10-CM | POA: Diagnosis not present

## 2017-11-18 DIAGNOSIS — H4920 Sixth [abducent] nerve palsy, unspecified eye: Secondary | ICD-10-CM | POA: Diagnosis present

## 2017-11-18 DIAGNOSIS — Z7902 Long term (current) use of antithrombotics/antiplatelets: Secondary | ICD-10-CM

## 2017-11-18 DIAGNOSIS — J9690 Respiratory failure, unspecified, unspecified whether with hypoxia or hypercapnia: Secondary | ICD-10-CM | POA: Diagnosis not present

## 2017-11-18 DIAGNOSIS — Z7984 Long term (current) use of oral hypoglycemic drugs: Secondary | ICD-10-CM

## 2017-11-18 DIAGNOSIS — I6340 Cerebral infarction due to embolism of unspecified cerebral artery: Secondary | ICD-10-CM | POA: Diagnosis not present

## 2017-11-18 DIAGNOSIS — Z955 Presence of coronary angioplasty implant and graft: Secondary | ICD-10-CM

## 2017-11-18 DIAGNOSIS — E119 Type 2 diabetes mellitus without complications: Secondary | ICD-10-CM | POA: Diagnosis not present

## 2017-11-18 DIAGNOSIS — R4781 Slurred speech: Secondary | ICD-10-CM | POA: Diagnosis present

## 2017-11-18 DIAGNOSIS — D6859 Other primary thrombophilia: Secondary | ICD-10-CM | POA: Diagnosis present

## 2017-11-18 DIAGNOSIS — D72829 Elevated white blood cell count, unspecified: Secondary | ICD-10-CM | POA: Diagnosis not present

## 2017-11-18 DIAGNOSIS — Z7189 Other specified counseling: Secondary | ICD-10-CM | POA: Diagnosis not present

## 2017-11-18 DIAGNOSIS — I6389 Other cerebral infarction: Secondary | ICD-10-CM | POA: Diagnosis not present

## 2017-11-18 DIAGNOSIS — Z4659 Encounter for fitting and adjustment of other gastrointestinal appliance and device: Secondary | ICD-10-CM | POA: Diagnosis not present

## 2017-11-18 DIAGNOSIS — R509 Fever, unspecified: Secondary | ICD-10-CM

## 2017-11-18 DIAGNOSIS — R05 Cough: Secondary | ICD-10-CM

## 2017-11-18 DIAGNOSIS — G8194 Hemiplegia, unspecified affecting left nondominant side: Secondary | ICD-10-CM | POA: Diagnosis present

## 2017-11-18 DIAGNOSIS — R6520 Severe sepsis without septic shock: Secondary | ICD-10-CM

## 2017-11-18 DIAGNOSIS — D638 Anemia in other chronic diseases classified elsewhere: Secondary | ICD-10-CM | POA: Diagnosis present

## 2017-11-18 DIAGNOSIS — R402133 Coma scale, eyes open, to sound, at hospital admission: Secondary | ICD-10-CM | POA: Diagnosis present

## 2017-11-18 DIAGNOSIS — R231 Pallor: Secondary | ICD-10-CM | POA: Diagnosis not present

## 2017-11-18 DIAGNOSIS — D691 Qualitative platelet defects: Secondary | ICD-10-CM | POA: Diagnosis present

## 2017-11-18 DIAGNOSIS — I959 Hypotension, unspecified: Secondary | ICD-10-CM

## 2017-11-18 DIAGNOSIS — R402313 Coma scale, best motor response, none, at hospital admission: Secondary | ICD-10-CM | POA: Diagnosis present

## 2017-11-18 DIAGNOSIS — C770 Secondary and unspecified malignant neoplasm of lymph nodes of head, face and neck: Secondary | ICD-10-CM | POA: Diagnosis present

## 2017-11-18 DIAGNOSIS — J398 Other specified diseases of upper respiratory tract: Secondary | ICD-10-CM | POA: Diagnosis present

## 2017-11-18 DIAGNOSIS — R402233 Coma scale, best verbal response, inappropriate words, at hospital admission: Secondary | ICD-10-CM | POA: Diagnosis present

## 2017-11-18 DIAGNOSIS — R1310 Dysphagia, unspecified: Secondary | ICD-10-CM | POA: Diagnosis present

## 2017-11-18 DIAGNOSIS — R5081 Fever presenting with conditions classified elsewhere: Secondary | ICD-10-CM | POA: Diagnosis not present

## 2017-11-18 DIAGNOSIS — I110 Hypertensive heart disease with heart failure: Secondary | ICD-10-CM | POA: Diagnosis present

## 2017-11-18 DIAGNOSIS — I672 Cerebral atherosclerosis: Secondary | ICD-10-CM | POA: Diagnosis present

## 2017-11-18 DIAGNOSIS — E785 Hyperlipidemia, unspecified: Secondary | ICD-10-CM | POA: Diagnosis present

## 2017-11-18 DIAGNOSIS — K219 Gastro-esophageal reflux disease without esophagitis: Secondary | ICD-10-CM | POA: Diagnosis present

## 2017-11-18 DIAGNOSIS — I6502 Occlusion and stenosis of left vertebral artery: Secondary | ICD-10-CM | POA: Diagnosis present

## 2017-11-18 DIAGNOSIS — I6523 Occlusion and stenosis of bilateral carotid arteries: Secondary | ICD-10-CM | POA: Diagnosis present

## 2017-11-18 DIAGNOSIS — E873 Alkalosis: Secondary | ICD-10-CM | POA: Diagnosis present

## 2017-11-18 DIAGNOSIS — R059 Cough, unspecified: Secondary | ICD-10-CM

## 2017-11-18 DIAGNOSIS — J189 Pneumonia, unspecified organism: Secondary | ICD-10-CM

## 2017-11-18 DIAGNOSIS — R29710 NIHSS score 10: Secondary | ICD-10-CM | POA: Diagnosis present

## 2017-11-18 DIAGNOSIS — H5500 Unspecified nystagmus: Secondary | ICD-10-CM | POA: Diagnosis present

## 2017-11-18 DIAGNOSIS — J9811 Atelectasis: Secondary | ICD-10-CM | POA: Diagnosis present

## 2017-11-18 DIAGNOSIS — D6489 Other specified anemias: Secondary | ICD-10-CM | POA: Diagnosis present

## 2017-11-18 DIAGNOSIS — I4891 Unspecified atrial fibrillation: Secondary | ICD-10-CM | POA: Diagnosis not present

## 2017-11-18 DIAGNOSIS — I509 Heart failure, unspecified: Secondary | ICD-10-CM | POA: Diagnosis present

## 2017-11-18 DIAGNOSIS — T17908A Unspecified foreign body in respiratory tract, part unspecified causing other injury, initial encounter: Secondary | ICD-10-CM

## 2017-11-18 DIAGNOSIS — E088 Diabetes mellitus due to underlying condition with unspecified complications: Secondary | ICD-10-CM | POA: Diagnosis present

## 2017-11-18 DIAGNOSIS — J9601 Acute respiratory failure with hypoxia: Secondary | ICD-10-CM | POA: Diagnosis not present

## 2017-11-18 DIAGNOSIS — J810 Acute pulmonary edema: Secondary | ICD-10-CM | POA: Diagnosis not present

## 2017-11-18 DIAGNOSIS — Z79899 Other long term (current) drug therapy: Secondary | ICD-10-CM

## 2017-11-18 DIAGNOSIS — R29722 NIHSS score 22: Secondary | ICD-10-CM | POA: Diagnosis not present

## 2017-11-18 DIAGNOSIS — E042 Nontoxic multinodular goiter: Secondary | ICD-10-CM | POA: Diagnosis present

## 2017-11-18 DIAGNOSIS — R0902 Hypoxemia: Secondary | ICD-10-CM

## 2017-11-18 DIAGNOSIS — B9689 Other specified bacterial agents as the cause of diseases classified elsewhere: Secondary | ICD-10-CM | POA: Diagnosis present

## 2017-11-18 DIAGNOSIS — R918 Other nonspecific abnormal finding of lung field: Secondary | ICD-10-CM | POA: Diagnosis present

## 2017-11-18 DIAGNOSIS — R0600 Dyspnea, unspecified: Secondary | ICD-10-CM

## 2017-11-18 DIAGNOSIS — Z9289 Personal history of other medical treatment: Secondary | ICD-10-CM

## 2017-11-18 HISTORY — PX: RADIOLOGY WITH ANESTHESIA: SHX6223

## 2017-11-18 HISTORY — PX: IR ANGIO VERTEBRAL SEL SUBCLAVIAN INNOMINATE UNI R MOD SED: IMG5365

## 2017-11-18 HISTORY — PX: IR ANGIO EXTRACRAN SEL COM CAROTID INNOMINATE UNI BILAT MOD SED: IMG5357

## 2017-11-18 HISTORY — PX: IR INTRA CRAN STENT: IMG2345

## 2017-11-18 LAB — DIFFERENTIAL
Abs Immature Granulocytes: 0.1 10*3/uL (ref 0.0–0.1)
BASOS PCT: 1 %
Basophils Absolute: 0.1 10*3/uL (ref 0.0–0.1)
EOS PCT: 9 %
Eosinophils Absolute: 0.8 10*3/uL — ABNORMAL HIGH (ref 0.0–0.7)
IMMATURE GRANULOCYTES: 1 %
LYMPHS PCT: 23 %
Lymphs Abs: 2.1 10*3/uL (ref 0.7–4.0)
MONO ABS: 0.7 10*3/uL (ref 0.1–1.0)
Monocytes Relative: 7 %
NEUTROS ABS: 5.6 10*3/uL (ref 1.7–7.7)
NEUTROS PCT: 59 %

## 2017-11-18 LAB — I-STAT CHEM 8, ED
BUN: 14 mg/dL (ref 8–23)
CALCIUM ION: 1.26 mmol/L (ref 1.15–1.40)
Chloride: 100 mmol/L (ref 98–111)
Creatinine, Ser: 0.5 mg/dL — ABNORMAL LOW (ref 0.61–1.24)
Glucose, Bld: 381 mg/dL — ABNORMAL HIGH (ref 70–99)
HEMATOCRIT: 42 % (ref 39.0–52.0)
Hemoglobin: 14.3 g/dL (ref 13.0–17.0)
Potassium: 4.2 mmol/L (ref 3.5–5.1)
Sodium: 135 mmol/L (ref 135–145)
TCO2: 21 mmol/L — ABNORMAL LOW (ref 22–32)

## 2017-11-18 LAB — GLUCOSE, CAPILLARY
GLUCOSE-CAPILLARY: 163 mg/dL — AB (ref 70–99)
GLUCOSE-CAPILLARY: 180 mg/dL — AB (ref 70–99)
GLUCOSE-CAPILLARY: 193 mg/dL — AB (ref 70–99)
GLUCOSE-CAPILLARY: 204 mg/dL — AB (ref 70–99)
GLUCOSE-CAPILLARY: 435 mg/dL — AB (ref 70–99)
Glucose-Capillary: 377 mg/dL — ABNORMAL HIGH (ref 70–99)
Glucose-Capillary: 230 mg/dL — ABNORMAL HIGH (ref 70–99)
Glucose-Capillary: 208 mg/dL — ABNORMAL HIGH (ref 70–99)

## 2017-11-18 LAB — POCT I-STAT 3, ART BLOOD GAS (G3+)
ACID-BASE DEFICIT: 2 mmol/L (ref 0.0–2.0)
BICARBONATE: 20.8 mmol/L (ref 20.0–28.0)
O2 SAT: 100 %
PH ART: 7.473 — AB (ref 7.350–7.450)
PO2 ART: 155 mmHg — AB (ref 83.0–108.0)
TCO2: 22 mmol/L (ref 22–32)
pCO2 arterial: 28.2 mmHg — ABNORMAL LOW (ref 32.0–48.0)

## 2017-11-18 LAB — I-STAT ARTERIAL BLOOD GAS, ED
ACID-BASE DEFICIT: 3 mmol/L — AB (ref 0.0–2.0)
Bicarbonate: 26.6 mmol/L (ref 20.0–28.0)
O2 SAT: 100 %
PO2 ART: 230 mmHg — AB (ref 83.0–108.0)
TCO2: 29 mmol/L (ref 22–32)
pCO2 arterial: 64.8 mmHg — ABNORMAL HIGH (ref 32.0–48.0)
pH, Arterial: 7.217 — ABNORMAL LOW (ref 7.350–7.450)

## 2017-11-18 LAB — TYPE AND SCREEN
ABO/RH(D): B POS
Antibody Screen: NEGATIVE

## 2017-11-18 LAB — CBG MONITORING, ED: GLUCOSE-CAPILLARY: 353 mg/dL — AB (ref 70–99)

## 2017-11-18 LAB — COMPREHENSIVE METABOLIC PANEL
ALBUMIN: 3.5 g/dL (ref 3.5–5.0)
ALK PHOS: 58 U/L (ref 38–126)
ALT: 34 U/L (ref 0–44)
ANION GAP: 12 (ref 5–15)
AST: 24 U/L (ref 15–41)
BUN: 13 mg/dL (ref 8–23)
CHLORIDE: 99 mmol/L (ref 98–111)
CO2: 23 mmol/L (ref 22–32)
Calcium: 9.8 mg/dL (ref 8.9–10.3)
Creatinine, Ser: 0.69 mg/dL (ref 0.61–1.24)
GFR calc non Af Amer: >60 mL/min (ref >60)
GLUCOSE: 377 mg/dL — AB (ref 70–99)
Potassium: 4.3 mmol/L (ref 3.5–5.1)
SODIUM: 134 mmol/L — AB (ref 135–145)
Total Bilirubin: 1 mg/dL (ref 0.3–1.2)
Total Protein: 7.2 g/dL (ref 6.5–8.1)

## 2017-11-18 LAB — PROTIME-INR
INR: 0.91
Prothrombin Time: 12.1 seconds (ref 11.4–15.2)

## 2017-11-18 LAB — I-STAT TROPONIN, ED: Troponin i, poc: 0.01 ng/mL (ref 0.00–0.08)

## 2017-11-18 LAB — CBC
HEMATOCRIT: 42.5 % (ref 39.0–52.0)
HEMOGLOBIN: 13.6 g/dL (ref 13.0–17.0)
MCH: 28.5 pg (ref 26.0–34.0)
MCHC: 32 g/dL (ref 30.0–36.0)
MCV: 89.1 fL (ref 78.0–100.0)
Platelets: 276 10*3/uL (ref 150–400)
RBC: 4.77 MIL/uL (ref 4.22–5.81)
RDW: 13.9 % (ref 11.5–15.5)
WBC: 9.3 10*3/uL (ref 4.0–10.5)

## 2017-11-18 LAB — HEMOGLOBIN AND HEMATOCRIT, BLOOD
HCT: 36.2 % — ABNORMAL LOW (ref 39.0–52.0)
Hemoglobin: 11.5 g/dL — ABNORMAL LOW (ref 13.0–17.0)

## 2017-11-18 LAB — MRSA PCR SCREENING: MRSA BY PCR: NEGATIVE

## 2017-11-18 LAB — APTT: APTT: 31 s (ref 24–36)

## 2017-11-18 LAB — TRIGLYCERIDES: TRIGLYCERIDES: 424 mg/dL — AB (ref <150)

## 2017-11-18 SURGERY — RADIOLOGY WITH ANESTHESIA
Anesthesia: General

## 2017-11-18 MED ORDER — ASPIRIN 81 MG PO CHEW
CHEWABLE_TABLET | ORAL | Status: AC
  Filled 2017-11-18: qty 1

## 2017-11-18 MED ORDER — PHENYLEPHRINE HCL-NACL 10-0.9 MG/250ML-% IV SOLN
0.0000 ug/min | INTRAVENOUS | Status: DC
  Administered 2017-11-18: 150 ug/min via INTRAVENOUS
  Filled 2017-11-18: qty 250

## 2017-11-18 MED ORDER — LABETALOL HCL 5 MG/ML IV SOLN
10.0000 mg | Freq: Once | INTRAVENOUS | Status: DC | PRN
  Filled 2017-11-18: qty 4

## 2017-11-18 MED ORDER — TICAGRELOR 90 MG PO TABS
90.0000 mg | ORAL_TABLET | Freq: Two times a day (BID) | ORAL | Status: DC
  Administered 2017-11-19 – 2017-12-07 (×38): 90 mg
  Filled 2017-11-18 (×36): qty 1

## 2017-11-18 MED ORDER — MIDAZOLAM HCL 2 MG/2ML IJ SOLN
INTRAMUSCULAR | Status: AC
  Filled 2017-11-18: qty 2

## 2017-11-18 MED ORDER — ONDANSETRON HCL 4 MG/2ML IJ SOLN
INTRAMUSCULAR | Status: AC
  Administered 2017-11-18: 10:00:00
  Filled 2017-11-18: qty 2

## 2017-11-18 MED ORDER — LIDOCAINE HCL 1 % IJ SOLN
INTRAMUSCULAR | Status: AC
  Filled 2017-11-18: qty 20

## 2017-11-18 MED ORDER — ACETAMINOPHEN 160 MG/5ML PO SOLN: 650.0000 mg | ORAL | Status: DC | PRN

## 2017-11-18 MED ORDER — ALTEPLASE (STROKE) FULL DOSE INFUSION
90.0000 mg | Freq: Once | INTRAVENOUS | Status: AC
  Administered 2017-11-18: 90 mg via INTRAVENOUS
  Filled 2017-11-18: qty 100

## 2017-11-18 MED ORDER — ROCURONIUM BROMIDE 10 MG/ML (PF) SYRINGE
PREFILLED_SYRINGE | INTRAVENOUS | Status: DC | PRN
  Administered 2017-11-18 (×3): 100 mg via INTRAVENOUS

## 2017-11-18 MED ORDER — CEFAZOLIN SODIUM-DEXTROSE 2-3 GM-%(50ML) IV SOLR
INTRAVENOUS | Status: DC | PRN
  Administered 2017-11-18: 3 g via INTRAVENOUS

## 2017-11-18 MED ORDER — ACETAMINOPHEN 325 MG PO TABS
650.0000 mg | ORAL_TABLET | ORAL | Status: DC | PRN
  Filled 2017-11-18: qty 2

## 2017-11-18 MED ORDER — ASPIRIN 81 MG PO CHEW
81.0000 mg | CHEWABLE_TABLET | Freq: Every day | ORAL | Status: DC
  Filled 2017-11-18 (×3): qty 1

## 2017-11-18 MED ORDER — ACETAMINOPHEN 160 MG/5ML PO SOLN
650.0000 mg | ORAL | Status: DC | PRN
  Administered 2017-11-19 – 2017-12-15 (×38): 650 mg
  Filled 2017-11-18 (×39): qty 20.3

## 2017-11-18 MED ORDER — LABETALOL HCL 5 MG/ML IV SOLN
10.0000 mg | Freq: Once | INTRAVENOUS | Status: AC
  Administered 2017-11-18: 10 mg via INTRAVENOUS

## 2017-11-18 MED ORDER — TIROFIBAN HCL IN NACL 5-0.9 MG/100ML-% IV SOLN
INTRAVENOUS | Status: AC
  Filled 2017-11-18: qty 100

## 2017-11-18 MED ORDER — ASPIRIN 81 MG PO CHEW
81.0000 mg | CHEWABLE_TABLET | Freq: Every day | ORAL | Status: DC
  Administered 2017-11-19 – 2017-12-03 (×15): 81 mg
  Filled 2017-11-18 (×14): qty 1

## 2017-11-18 MED ORDER — IOHEXOL 300 MG/ML  SOLN
300.0000 mL | Freq: Once | INTRAMUSCULAR | Status: AC | PRN
  Administered 2017-11-18: 150 mL via INTRATHECAL

## 2017-11-18 MED ORDER — NITROGLYCERIN 1 MG/10 ML FOR IR/CATH LAB
INTRA_ARTERIAL | Status: AC
  Filled 2017-11-18: qty 10

## 2017-11-18 MED ORDER — FENTANYL CITRATE (PF) 100 MCG/2ML IJ SOLN: 50.0000 ug | Freq: Once | INTRAMUSCULAR | Status: DC

## 2017-11-18 MED ORDER — SODIUM CHLORIDE 0.9 % IV SOLN
INTRAVENOUS | Status: DC
  Administered 2017-11-18 – 2017-11-24 (×9): via INTRAVENOUS

## 2017-11-18 MED ORDER — PROPOFOL 1000 MG/100ML IV EMUL
INTRAVENOUS | Status: AC
  Filled 2017-11-18: qty 100

## 2017-11-18 MED ORDER — ACETAMINOPHEN 650 MG RE SUPP
650.0000 mg | RECTAL | Status: DC | PRN
  Administered 2017-11-19: 650 mg via RECTAL
  Filled 2017-11-18: qty 1

## 2017-11-18 MED ORDER — STROKE: EARLY STAGES OF RECOVERY BOOK
Freq: Once | Status: AC
  Administered 2017-11-18: 18:00:00
  Filled 2017-11-18: qty 1

## 2017-11-18 MED ORDER — PHENYLEPHRINE HCL 10 MG/ML IJ SOLN
INTRAMUSCULAR | Status: DC | PRN
  Administered 2017-11-18: 120 ug via INTRAVENOUS
  Administered 2017-11-18: 80 ug via INTRAVENOUS
  Administered 2017-11-18 (×4): 40 ug via INTRAVENOUS

## 2017-11-18 MED ORDER — IOPAMIDOL (ISOVUE-370) INJECTION 76%
100.0000 mL | Freq: Once | INTRAVENOUS | Status: AC | PRN
  Administered 2017-11-18: 100 mL via INTRAVENOUS

## 2017-11-18 MED ORDER — IOPAMIDOL (ISOVUE-370) INJECTION 76%
INTRAVENOUS | Status: AC
  Filled 2017-11-18: qty 100

## 2017-11-18 MED ORDER — PANTOPRAZOLE SODIUM 40 MG PO TBEC: 40.0000 mg | DELAYED_RELEASE_TABLET | Freq: Every day | ORAL | Status: DC

## 2017-11-18 MED ORDER — ASPIRIN 325 MG PO TABS
ORAL_TABLET | ORAL | Status: AC
  Filled 2017-11-18: qty 1

## 2017-11-18 MED ORDER — TICAGRELOR 90 MG PO TABS
ORAL_TABLET | ORAL | Status: AC
  Filled 2017-11-18: qty 2

## 2017-11-18 MED ORDER — SODIUM CHLORIDE 0.9 % IV BOLUS
500.0000 mL | Freq: Once | INTRAVENOUS | Status: AC
  Administered 2017-11-18: 500 mL via INTRAVENOUS

## 2017-11-18 MED ORDER — INSULIN ASPART 100 UNIT/ML ~~LOC~~ SOLN
SUBCUTANEOUS | Status: DC | PRN
  Administered 2017-11-18: 10 [IU] via SUBCUTANEOUS

## 2017-11-18 MED ORDER — FENTANYL BOLUS VIA INFUSION
50.0000 ug | INTRAVENOUS | Status: DC | PRN
  Administered 2017-11-18 – 2017-11-22 (×6): 50 ug via INTRAVENOUS
  Filled 2017-11-18: qty 50

## 2017-11-18 MED ORDER — FENTANYL CITRATE (PF) 100 MCG/2ML IJ SOLN
INTRAMUSCULAR | Status: AC
  Filled 2017-11-18: qty 2

## 2017-11-18 MED ORDER — NICARDIPINE HCL IN NACL 20-0.86 MG/200ML-% IV SOLN
0.0000 mg/h | INTRAVENOUS | Status: DC
  Administered 2017-11-18: 5 mg/h via INTRAVENOUS
  Administered 2017-11-19 (×2): 7.5 mg/h via INTRAVENOUS
  Administered 2017-11-19 (×2): 5 mg/h via INTRAVENOUS
  Administered 2017-11-19: 2.5 mg/h via INTRAVENOUS
  Administered 2017-11-20: 3 mg/h via INTRAVENOUS
  Filled 2017-11-18 (×6): qty 200

## 2017-11-18 MED ORDER — EPTIFIBATIDE 20 MG/10ML IV SOLN
INTRAVENOUS | Status: DC | PRN
  Administered 2017-11-18 (×3): 1.5 mg via INTRAVENOUS

## 2017-11-18 MED ORDER — SODIUM CHLORIDE 0.9 % IV SOLN
INTRAVENOUS | Status: DC
  Administered 2017-11-18: 3.2 [IU]/h via INTRAVENOUS
  Administered 2017-11-18: 7.2 [IU]/h via INTRAVENOUS
  Administered 2017-11-19: 10.4 [IU]/h via INTRAVENOUS
  Administered 2017-11-20: 5.2 [IU]/h via INTRAVENOUS
  Administered 2017-11-21: 8 [IU]/h via INTRAVENOUS
  Administered 2017-11-21: 9 [IU]/h via INTRAVENOUS
  Filled 2017-11-18 (×7): qty 1

## 2017-11-18 MED ORDER — ROCURONIUM BROMIDE 50 MG/5ML IV SOLN
INTRAVENOUS | Status: AC | PRN
  Administered 2017-11-18: 50 mg via INTRAVENOUS

## 2017-11-18 MED ORDER — MIDAZOLAM HCL 5 MG/5ML IJ SOLN
INTRAMUSCULAR | Status: AC | PRN
  Administered 2017-11-18: 2 mg via INTRAVENOUS

## 2017-11-18 MED ORDER — ACETAMINOPHEN 650 MG RE SUPP: 650.0000 mg | RECTAL | Status: DC | PRN

## 2017-11-18 MED ORDER — CEFAZOLIN SODIUM-DEXTROSE 2-4 GM/100ML-% IV SOLN
INTRAVENOUS | Status: AC
  Filled 2017-11-18: qty 200

## 2017-11-18 MED ORDER — SODIUM CHLORIDE 0.9 % IV SOLN
3.0000 g | Freq: Four times a day (QID) | INTRAVENOUS | Status: DC
  Administered 2017-11-18 – 2017-11-23 (×19): 3 g via INTRAVENOUS
  Filled 2017-11-18 (×21): qty 3

## 2017-11-18 MED ORDER — PROPOFOL 1000 MG/100ML IV EMUL
5.0000 ug/kg/min | INTRAVENOUS | Status: DC
  Administered 2017-11-18: 15 ug/kg/min via INTRAVENOUS
  Administered 2017-11-18: 5 ug/kg/min via INTRAVENOUS
  Filled 2017-11-18: qty 100

## 2017-11-18 MED ORDER — CLOPIDOGREL BISULFATE 300 MG PO TABS
ORAL_TABLET | ORAL | Status: AC
  Filled 2017-11-18: qty 1

## 2017-11-18 MED ORDER — ORAL CARE MOUTH RINSE
15.0000 mL | OROMUCOSAL | Status: DC
  Administered 2017-11-18 – 2017-12-15 (×265): 15 mL via OROMUCOSAL

## 2017-11-18 MED ORDER — PHENYLEPHRINE HCL 10 MG/ML IJ SOLN
INTRAVENOUS | Status: DC | PRN
  Administered 2017-11-18: 20 ug/min via INTRAVENOUS

## 2017-11-18 MED ORDER — SODIUM CHLORIDE 0.9 % IV SOLN
50.0000 mL/h | INTRAVENOUS | Status: DC
  Administered 2017-11-18: 50 mL/h via INTRAVENOUS

## 2017-11-18 MED ORDER — CHLORHEXIDINE GLUCONATE 0.12% ORAL RINSE (MEDLINE KIT)
15.0000 mL | Freq: Two times a day (BID) | OROMUCOSAL | Status: DC
  Administered 2017-11-18 – 2017-12-15 (×53): 15 mL via OROMUCOSAL

## 2017-11-18 MED ORDER — FENTANYL 2500MCG IN NS 250ML (10MCG/ML) PREMIX INFUSION
25.0000 ug/h | INTRAVENOUS | Status: DC
  Administered 2017-11-18: 50 ug/h via INTRAVENOUS
  Administered 2017-11-20: 75 ug/h via INTRAVENOUS
  Administered 2017-11-21: 50 ug/h via INTRAVENOUS
  Administered 2017-11-22: 200 ug/h via INTRAVENOUS
  Administered 2017-11-22: 250 ug/h via INTRAVENOUS
  Administered 2017-11-23: 300 ug/h via INTRAVENOUS
  Administered 2017-11-23: 100 ug/h via INTRAVENOUS
  Administered 2017-11-24 – 2017-11-25 (×4): 150 ug/h via INTRAVENOUS
  Administered 2017-11-26: 125 ug/h via INTRAVENOUS
  Administered 2017-11-27: 100 ug/h via INTRAVENOUS
  Administered 2017-11-30: 50 ug/h via INTRAVENOUS
  Administered 2017-12-02 – 2017-12-05 (×4): 100 ug/h via INTRAVENOUS
  Administered 2017-12-06: 125 ug/h via INTRAVENOUS
  Administered 2017-12-07: 50 ug/h via INTRAVENOUS
  Administered 2017-12-08: 200 ug/h via INTRAVENOUS
  Administered 2017-12-08: 150 ug/h via INTRAVENOUS
  Administered 2017-12-09: 15 ug/h via INTRAVENOUS
  Administered 2017-12-11 – 2017-12-13 (×2): 50 ug/h via INTRAVENOUS
  Administered 2017-12-14: 75 ug/h via INTRAVENOUS
  Filled 2017-11-18 (×28): qty 250

## 2017-11-18 MED ORDER — TICAGRELOR 60 MG PO TABS
ORAL_TABLET | ORAL | Status: DC | PRN
  Administered 2017-11-18: 180 mg

## 2017-11-18 MED ORDER — ETOMIDATE 2 MG/ML IV SOLN
INTRAVENOUS | Status: AC | PRN
  Administered 2017-11-18: 20 mg via INTRAVENOUS

## 2017-11-18 MED ORDER — ACETAMINOPHEN 325 MG PO TABS: 650.0000 mg | ORAL_TABLET | ORAL | Status: DC | PRN

## 2017-11-18 MED ORDER — FENTANYL CITRATE (PF) 100 MCG/2ML IJ SOLN
INTRAMUSCULAR | Status: AC | PRN
  Administered 2017-11-18: 100 ug via INTRAVENOUS

## 2017-11-18 MED ORDER — NICARDIPINE HCL IN NACL 20-0.86 MG/200ML-% IV SOLN
0.0000 mg/h | INTRAVENOUS | Status: DC | PRN
  Filled 2017-11-18 (×3): qty 200

## 2017-11-18 MED ORDER — NITROGLYCERIN 1 MG/10 ML FOR IR/CATH LAB
INTRA_ARTERIAL | Status: DC | PRN
  Administered 2017-11-18: 25 ug via INTRA_ARTERIAL

## 2017-11-18 MED ORDER — ASPIRIN 81 MG PO CHEW
CHEWABLE_TABLET | ORAL | Status: DC | PRN
  Administered 2017-11-18: 81 mg

## 2017-11-18 MED ORDER — TICAGRELOR 90 MG PO TABS
90.0000 mg | ORAL_TABLET | Freq: Two times a day (BID) | ORAL | Status: DC
  Filled 2017-11-18 (×5): qty 1

## 2017-11-18 MED ORDER — LACTATED RINGERS IV SOLN
INTRAVENOUS | Status: DC | PRN
  Administered 2017-11-18 (×2): via INTRAVENOUS

## 2017-11-18 MED ORDER — EPTIFIBATIDE 20 MG/10ML IV SOLN
INTRAVENOUS | Status: AC
  Filled 2017-11-18: qty 10

## 2017-11-18 MED ORDER — IOPAMIDOL (ISOVUE-370) INJECTION 76%
50.0000 mL | Freq: Once | INTRAVENOUS | Status: AC | PRN
  Administered 2017-11-18: 50 mL via INTRAVENOUS

## 2017-11-18 NOTE — ED Notes (Signed)
Foley insertion site appears fine. No bleeding present.

## 2017-11-18 NOTE — Progress Notes (Signed)
Pharmacy Antibiotic Note  [name] [name] is a 62 y.o. male admitted on 11/18/2017 with aspiration pneumonia.  Pharmacy has been consulted for Unasyn dosing.  Plan: Unasyn 3g IV q6h Follow renal function, c/s, clinical progression, LOT  Height: 5\' 10"  (177.8 cm) Weight: (!) 312 lb 2.7 oz (141.6 kg) IBW/kg (Calculated) : 73  Temp (24hrs), Avg:97.2 F (36.2 C), Min:97.2 F (36.2 C), Max:97.2 F (36.2 C)  Recent Labs  Lab 11/18/17 0928 11/18/17 0933  WBC 9.3  --   CREATININE 0.69 0.50*    Estimated Creatinine Clearance: 137.7 mL/min (A) (by C-G formula based on SCr of 0.5 mg/dL (L)).    No Known Allergies  Antimicrobials this admission: Unasyn 6/27 >>   Dose adjustments this admission: n/a  Microbiology results: N/a  Thank you for allowing pharmacy to be a part of this patient's care.  Jordynne Mccown D. Kindsey Eblin, PharmD, BCPS Clinical Pharmacist [phone] Please check AMION for all [place] numbers 11/18/2017 11:04 AM

## 2017-11-18 NOTE — Transfer of Care (Signed)
Immediate Anesthesia Transfer of Care Note  Patient: [name] [name]  Procedure(s) Performed: RADIOLOGY WITH ANESTHESIA (N/A )  Patient Location: ICU  Anesthesia Type:General  Level of Consciousness: Patient remains intubated per anesthesia plan  Airway & Oxygen Therapy: Patient remains intubated per anesthesia plan and Patient placed on Ventilator (see vital sign flow sheet for setting)  Post-op Assessment: Report given to RN and Post -op Vital signs reviewed and stable  Post vital signs: Reviewed and stable  Last Vitals:  Vitals Value Taken Time  BP 95/46 11/18/2017  5:44 PM  Temp    Pulse 94 11/18/2017  5:53 PM  Resp 7 11/18/2017  5:53 PM  SpO2 96 % 11/18/2017  5:53 PM  Vitals shown include unvalidated device data.  Last Pain:  Vitals:   11/18/17 1215  TempSrc: Axillary  PainSc:          Complications: No apparent anesthesia complications

## 2017-11-18 NOTE — Procedures (Signed)
Bronchoscopy Procedure Note [name] [name] [id number] 08/13/1955  Procedure: Bronchoscopy Indications: Diagnostic evaluation of the airways and Remove secretions  Procedure Details Consent: Risks of procedure as well as the alternatives and risks of each were explained to the (patient/caregiver).  Consent for procedure obtained. Time Out: Verified patient identification, verified procedure, site/side was marked, verified correct patient position, special equipment/implants available, medications/allergies/relevent history reviewed, required imaging and test results available.  Performed  In preparation for procedure, patient was given 100% FiO2 and bronchoscope lubricated. Sedation: Benzodiazepines, Muscle relaxants, Etomidate and Fentanyl  Airway entered and the following bronchi were examined: RUL, RML, RLL, LUL, LLL and Bronchi.   Site of bleeding is 3 cm below the cords, appears like erosion Bronchoscope removed.    Evaluation Hemodynamic Status: BP stable throughout; O2 sats: stable throughout Patient's Current Condition: stable Specimens:  None Complications: No apparent complications Patient did tolerate procedure well.   [name] [name] 11/18/2017

## 2017-11-18 NOTE — Progress Notes (Signed)
Dr Nelda Marseille called re: ABG results, awaiting on MD orders. Pt transported to 4N w/ no apparent complications.  RT at bedside.

## 2017-11-18 NOTE — ED Provider Notes (Signed)
[facility] EMERGENCY DEPARTMENT Provider Note   CSN: [id number] Arrival date & time: 11/18/17  0350   An emergency department physician performed an initial assessment on this suspected stroke patient at 0926.  History   Chief Complaint Chief Complaint  Patient presents with  . Code Stroke    HPI [name] [name] is a 62 y.o. male.  HPI Patient awakened this morning at normal baseline.  He reports at 8 AM he had sudden onset of left weakness of his arm and leg.  He has slurred speech.  These were all all immediate and new findings.  Patient does have history of atrial fibrillation and discontinued Eliquis 3 weeks ago due to problems with esophageal bleeding and hemoptysis. Past Medical History:  Diagnosis Date  . Collagen vascular disease ([place])   . Coronary artery disease   . GERD (gastroesophageal reflux disease)   . High cholesterol   . Hypertension   . OSA (obstructive sleep apnea)    "dx'd years ago; never have worn mask; mask never ordered" (04/01/2017)  . Pneumonia 2011  . Type II diabetes mellitus ([place]) 07/2016    Patient Active Problem List   Diagnosis Date Noted  . Claudication in peripheral vascular disease ([place]) 04/05/2017  . Hyperlipidemia LDL goal <70   . Diabetes mellitus due to underlying condition with unspecified complications ([place]) 09/38/1829  . PAF (paroxysmal atrial fibrillation) ([place]) 02/22/2017  . History of smoking 02/22/2017  . Upper airway cough syndrome 01/15/2017  . CAD (coronary artery disease) 09/16/2016  . Essential hypertension 09/11/2016  . Weight loss   . Night sweats   . OSA (obstructive sleep apnea)   . Morbid obesity due to excess calories ([place])   . Lung nodules     Past Surgical History:  Procedure Laterality Date  . CORONARY ANGIOPLASTY WITH STENT PLACEMENT  2011; 07/2016   s/p PCI  . LOWER EXTREMITY ANGIOGRAPHY Bilateral 04/01/2017   Procedure: Lower Extremity Angiography;  Surgeon: Lorretta Harp, MD;   Location: [facility];  Service: Cardiovascular;  Laterality: Bilateral;  . PERIPHERAL VASCULAR INTERVENTION Left 04/01/2017   Procedure: PERIPHERAL VASCULAR INTERVENTION;  Surgeon: Lorretta Harp, MD;  Location: [facility];  Service: Cardiovascular;  Laterality: Left;  common iliac  . VIDEO BRONCHOSCOPY Bilateral 08/25/2016   Procedure: VIDEO BRONCHOSCOPY WITHOUT FLUORO;  Surgeon: Collene Gobble, MD;  Location: [facility];  Service: Cardiopulmonary;  Laterality: Bilateral;        Home Medications    Prior to Admission medications   Medication Sig Start Date End Date Taking? Authorizing Provider  apixaban (ELIQUIS) 5 MG TABS tablet Take 1 tablet (5 mg total) 2 (two) times daily by mouth. 04/05/17   Revankar, Reita Cliche, MD  B Complex Vitamins (VITAMIN B COMPLEX PO) Take 1 capsule at bedtime by mouth.     [provider]  clopidogrel (PLAVIX) 75 MG tablet Take 1 tablet (75 mg total) by mouth daily. Patient taking differently: Take 75 mg every evening by mouth.  09/11/16   Maryellen Pile, MD  dronedarone (MULTAQ) 400 MG tablet Take 1 tablet (400 mg total) 2 (two) times daily with a meal by mouth. 04/05/17   Revankar, Reita Cliche, MD  famotidine (PEPCID) 20 MG tablet One at bedtime Patient taking differently: Take 20 mg at bedtime by mouth. One at bedtime 01/15/17   Tanda Rockers, MD  fenofibrate 160 MG tablet Take 1 tablet (160 mg total) by mouth daily. 06/14/17   Revankar,  Reita Cliche, MD  irbesartan (AVAPRO) 75 MG tablet TAKE ONE TABLET BY MOUTH ONE TIME DAILY  05/31/17   Tanda Rockers, MD  liraglutide (VICTOZA) 18 MG/3ML SOPN Inject 0.2 mLs (1.2 mg total) into the skin daily at 10 pm. Start with 0.6 mg for one week, then increase to 1.2 mg daily. 06/25/17   Lorella Nimrod, MD  metFORMIN (GLUCOPHAGE) 1000 MG tablet Take 1,000 mg by mouth 2 (two) times daily with a meal.    [provider]  metoprolol tartrate (LOPRESSOR) 50 MG tablet Take 1 tablet (50 mg total) by mouth  2 (two) times daily. 11/01/17 04/30/18  Revankar, Reita Cliche, MD  nitroGLYCERIN (NITROSTAT) 0.4 MG SL tablet Place 1 tablet (0.4 mg total) under the tongue every 5 (five) minutes as needed for chest pain. 08/13/17 11/11/17  Revankar, Reita Cliche, MD  Omega-3 Fatty Acids (FISH OIL) 1000 MG CAPS Take 2 capsules (2,000 mg total) by mouth 2 (two) times daily. 08/23/17   Revankar, Reita Cliche, MD  pantoprazole (PROTONIX) 40 MG tablet TAKE 1 TABLET BY MOUTH ONCE A DAY 30 TO 60 MINUTES BEFORE FIRST MEAL OF THE DAY  07/09/17   Tanda Rockers, MD  rosuvastatin (CRESTOR) 10 MG tablet Take 5 mg by mouth at bedtime.    [provider]    Family History Family History  Problem Relation Age of Onset  . Alzheimer's disease Mother   . Breast cancer Sister     Social History Social History   Tobacco Use  . Smoking status: Former Smoker    Packs/day: 1.00    Years: 40.00    Pack years: 40.00    Types: Cigarettes    Last attempt to quit: 2014    Years since quitting: 5.4  . Smokeless tobacco: Never Used  Substance Use Topics  . Alcohol use: Yes    Comment: 04/01/2017 "couple drinks once/month"  . Drug use: No    Types: Marijuana    Comment: marijuana in the 70s     Allergies   Patient has no known allergies.   Review of Systems Review of Systems Level 5 caveat cannot obtain review of systems due to patient condition.  Physical Exam Updated Vital Signs BP (!) 150/87   Pulse (!) 104   Temp (!) 97.2 F (36.2 C) (Oral)   Resp 20   Wt (!) 141.6 kg (312 lb 2.7 oz)   SpO2 93%   BMI 44.79 kg/m   Physical Exam  Constitutional:  Patient has somnolent and ill appearance but awakens to answer questions appropriately.  Diaphoretic.  No respiratory distress.  HENT:  Head: Normocephalic and atraumatic.  Patient's airway is patent without pooling secretions.  He has very poor dentition.  Eyes:  Patient seems to have a slight right gaze preference.  Cardiovascular: Normal rate, regular rhythm and  normal heart sounds.  Pulmonary/Chest:  Patient is protecting his airway.  He is however exhibiting some mental depression consistent with dense stroke.  Slightly sonorous breath sounds in recumbent position.  Breath sounds are grossly clear in the lateral inferior axillary auscultations.  Abdominal: Soft.  Abdomen is morbidly obese but soft to palpation.  Musculoskeletal:  Patient is wearing compression hose.  No asymmetric peripheral edema.  Neurological:  Patient has left-sided facial droop.  Slight right gaze preference.  Is answering questions appropriately and not exhibiting expressive or receptive aphasia.  She has strong grip strength on the right.  Flaccid paralysis left upper extremity.  Patient can elevate  right lower extremity off of the bed without difficulty.  S of paralysis left lower extremity.  Skin:  Skin is warm and slightly diaphoretic.     ED Treatments / Results  Labs (all labs ordered are listed, but only abnormal results are displayed) Labs Reviewed  DIFFERENTIAL - Abnormal; Notable for the following components:      Result Value   Eosinophils Absolute 0.8 (*)    All other components within normal limits  CBG MONITORING, ED - Abnormal; Notable for the following components:   Glucose-Capillary 353 (*)    All other components within normal limits  I-STAT CHEM 8, ED - Abnormal; Notable for the following components:   Creatinine, Ser 0.50 (*)    Glucose, Bld 381 (*)    TCO2 21 (*)    All other components within normal limits  PROTIME-INR  APTT  CBC  COMPREHENSIVE METABOLIC PANEL  I-STAT TROPONIN, ED    EKG None  Radiology Ct Head Code Stroke Wo Contrast  Result Date: 11/18/2017 CLINICAL DATA:  Code stroke.  Left-sided deficit, slurred speech EXAM: CT HEAD WITHOUT CONTRAST TECHNIQUE: Contiguous axial images were obtained from the base of the skull through the vertex without intravenous contrast. COMPARISON:  None. FINDINGS: Brain: No evidence of acute  infarction, hemorrhage, hydrocephalus, extra-axial collection or mass lesion/mass effect. Vascular: Negative for hyperdense vessel. Calcification in distal vertebral and carotid arteries is advanced bilaterally Skull: Negative Sinuses/Orbits: Negative Other: None ASPECTS (Gregory Stroke Program Early CT Score) - Ganglionic level infarction (caudate, lentiform nuclei, internal capsule, insula, M1-M3 cortex): 7 - Supraganglionic infarction (M4-M6 cortex): 3 Total score (0-10 with 10 being normal): 10 IMPRESSION: 1. No acute intracranial abnormality.  Atherosclerotic calcification 2. ASPECTS is 10 Electronically Signed   By: Franchot Gallo M.D.   On: 11/18/2017 09:54    Procedures Procedures (including critical care time) No critical care time. Medications Ordered in ED Medications  ondansetron (ZOFRAN) 4 MG/2ML injection (  Given 11/18/17 0933)  iopamidol (ISOVUE-370) 76 % injection 50 mL (50 mLs Intravenous Contrast Given 11/18/17 0959)     Initial Impression / Assessment and Plan / ED Course  I have reviewed the triage vital signs and the nursing notes.  Pertinent labs & imaging results that were available during my care of the patient were reviewed by me and considered in my medical decision making (see chart for details).    Consult: Patient seen as code stroke by stroke team.  Final Clinical Impressions(s) / ED Diagnoses   Final diagnoses:  Cough  Stroke (cerebrum) ([place])  Hemoptysis   Patient presents with acute stroke.  Seen by stroke team and is complicated in aspect of bleeding risk with administration of TPA.  Stroke team has consulted critical care for elective intubation to protect airway with history of known hemoptysis.  I evaluate the patient upon presentation to the emergency department.  He presented with stable airway and signs of stroke but responsive and answering questions.  Management  assumed by neurology and critical care. ED Discharge Orders    None         Charlesetta Shanks, MD 11/25/17 [id number]

## 2017-11-18 NOTE — Code Documentation (Addendum)
Bronchoscopy and Intubation complete. Dr. Leonel Ramsay and Dr. Nelda Marseille discussed giving tPA. tPA being mixed at this time.

## 2017-11-18 NOTE — ED Triage Notes (Signed)
Pt presents for evaluation of stroke symptoms starting at 0800 today. Pt has left sided weakness to arm and L facial droop. Slurred speech. Wife at bedside. Pt with hx of afib, recently dc elliquis (3 weeks ago) due to esophageal bleeding per patient. CBG 342 in route.

## 2017-11-18 NOTE — Progress Notes (Signed)
Pt transferred to 4N. Bedside report given to 4N RN, no further questions. Rt Femoral groin site level 0, unremarkable, at this time. Dorsalis pedis and Posterior tibial pulses absent bilaterally, RN and vascular MD notified.

## 2017-11-18 NOTE — ED Notes (Signed)
Report given to 4N.

## 2017-11-18 NOTE — Procedures (Signed)
S/P 4 vessel cerebral artriogram RT CFA approach. Findings  1. 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 Occluded non dominant RT VBJ. 2.S/P endovascular revascularization of the distal basilar artery  To 90 % patency ,and of 30 to 40 % of the LT VBJ with stent placement. Dominant  Lt Pcom.

## 2017-11-18 NOTE — Procedures (Signed)
OGT Placement By MD  OGT placed under direct laryngoscopy and verified by auscultation.  Jakwan Sally G. Truc Winfree, M.D. [facility]. Pager: [phone]. After hours pager: [phone]. 

## 2017-11-18 NOTE — Code Documentation (Signed)
Patient taken back to [facility] to be evaluated by Critical Care. Pt had repeat CT Head and CTA completed showing a basilar artery occlusion. Critical Care cleared patient to go to IR. IR called by MD Leonel Ramsay. Family updated by Leonel Ramsay and made aware of patient needing to be taken to IR. Verbalized understanding and consent.

## 2017-11-18 NOTE — Anesthesia Preprocedure Evaluation (Signed)
Anesthesia Evaluation  Patient identified by MRN, date of birth, ID band Patient unresponsive    Reviewed: Allergy & Precautions, Patient's Chart, lab work & pertinent test results, Unable to perform ROS - Chart review onlyPreop documentation limited or incomplete due to emergent nature of procedure.  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 on medications and Pt. on home beta blockers + CAD  + dysrhythmias Atrial Fibrillation  Rhythm:Irregular Rate:Tachycardia     Neuro/Psych CVA    GI/Hepatic GERD  Medicated,  Endo/Other  diabetesMorbid obesity  Renal/GU negative Renal ROS     Musculoskeletal   Abdominal   Peds  Hematology   Anesthesia Other Findings Left ventricle: The cavity size was normal. There was moderate   concentric hypertrophy. Systolic function was normal. The   estimated ejection fraction was in the range of 55% to 60%. Wall   motion was normal; there were no regional wall motion   abnormalities. Doppler parameters are consistent with abnormal   left ventricular relaxation (grade 1 diastolic dysfunction). The   E/e&' ratio is between 8-15, suggesting indeterminate LV filling   pressure. - Aorta: Aortic root dimension: 42 mm (ED). - Aortic root: The aortic root is dilated. - Left atrium: Moderately dilated. - Inferior vena cava: The vessel was normal in size. The   respirophasic diameter changes were in the normal range (>= 50%),   consistent with normal central venous  pressure.  Reproductive/Obstetrics                             Anesthesia Physical Anesthesia Plan  ASA: IV  Anesthesia Plan:    Post-op Pain Management:    Induction: Inhalational  PONV Risk Score and Plan: 1 and Treatment may vary due to age or medical condition  Airway Management Planned: Oral ETT  Additional Equipment: Arterial line  Intra-op Plan:   Post-operative Plan: Post-operative intubation/ventilation  Informed Consent:   Only emergency history available  Plan Discussed with: CRNA  Anesthesia Plan Comments:         Anesthesia Quick Evaluation

## 2017-11-18 NOTE — ED Notes (Signed)
Pt's CBG result was 353. Informed Martie Round - RN.

## 2017-11-18 NOTE — Progress Notes (Signed)
RT transported patient on ventilator from room 4N19 to CT and back to room 6H60 with no complications. Vitals are stable. RT will continue to monitor.

## 2017-11-18 NOTE — Code Documentation (Addendum)
Dr. Nelda Marseille at the bedside preparing to intubate patient. Patient verbally consent for intubation and procedure. Family and patient updated and aware of risks for procedure and tPA. Verbalized understanding. Neurology approved that patient was cognitively able to consent. Seyed Heffley stated that Hilton Cork could sign consent form in his place. Patient Stated that Hilton Cork is able to consent for him if he is unable to consent in the future. Hilton Cork signed consent form and procedure began.

## 2017-11-18 NOTE — Consult Note (Signed)
PULMONARY / CRITICAL CARE MEDICINE   Name: [name] [name] MRN: [id number] DOB: 1956-02-12    ADMISSION DATE:  11/18/2017 CONSULTATION DATE: 11/18/2017  REFERRING MD: Emergency department  CHIEF COMPLAINT: Stroke  HISTORY OF PRESENT ILLNESS:    [name]  is a 62 year old male with a plethora of health issues who presented to [facility] on 11/18/2017 after contacting 911 when he noted slurred speech and right facial.  He was transported to [facility] emergency department code stroke was initiated.  He had been on Eliquis in the past for paroxysmal atrial fibrillation have been stopped 3 weeks prior due to hemoptysis. 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  There was concern that TPA could not be given due to recent hemoptysis and erosion of the thyroid nodule into the airway. He was urgently intubated by Dr. Nelda Marseille and erosion site was covered with the balloon cuff.  Patient was then given TPA and will be transferred to the neuro stroke unit. He will most likely remain intubated for period of 24 to 48 hours.  He will need further evaluation for most likely cancerous process in the neck and the lung. Note he had observed aspiration.  PAST MEDICAL HISTORY :  He  has a past medical history of Collagen vascular disease ([place]), Coronary artery disease, GERD (gastroesophageal reflux disease), High cholesterol, Hypertension, OSA (obstructive sleep apnea), Pneumonia (2011), and Type II diabetes mellitus ([place]) (07/2016).  PAST SURGICAL HISTORY: He  has a past surgical history that includes Video bronchoscopy (Bilateral, 08/25/2016); Lower Extremity Angiography (Bilateral, 04/01/2017); PERIPHERAL VASCULAR INTERVENTION (Left, 04/01/2017); and Coronary angioplasty with stent (2011; 07/2016).  No Known Allergies  No current facility-administered medications on file prior to encounter.    Current  Outpatient Medications on File Prior to Encounter  Medication Sig  . fenofibrate 160 MG tablet Take 1 tablet (160 mg total) by mouth daily.  . irbesartan (AVAPRO) 75 MG tablet TAKE ONE TABLET BY MOUTH ONE TIME DAILY   . metFORMIN (GLUCOPHAGE) 1000 MG tablet Take 1,000 mg by mouth 2 (two) times daily with a meal.  . metoprolol tartrate (LOPRESSOR) 50 MG tablet Take 1 tablet (50 mg total) by mouth 2 (two) times daily.  . nitroGLYCERIN (NITROSTAT) 0.4 MG SL tablet Place 1 tablet (0.4 mg total) under the tongue every 5 (five) minutes as needed for chest pain.  Marland Kitchen apixaban (ELIQUIS) 5 MG TABS tablet Take 1 tablet (5 mg total) 2 (two) times daily by mouth.  . B Complex Vitamins (VITAMIN B COMPLEX PO) Take 1 capsule at bedtime by mouth.   . clopidogrel (PLAVIX) 75 MG tablet Take 1 tablet (75 mg total) by mouth daily.  Marland Kitchen dronedarone (MULTAQ) 400 MG tablet Take 1 tablet (400 mg total) 2 (two) times daily with a meal by mouth.  . famotidine (PEPCID) 20 MG tablet One at bedtime (Patient not taking: Reported on 11/18/2017)  . liraglutide (VICTOZA) 18 MG/3ML SOPN Inject 0.2 mLs (1.2 mg total) into the skin daily at 10 pm. Start with 0.6 mg for one week, then increase to 1.2 mg daily. (Patient not taking: Reported on 11/18/2017)  . Omega-3 Fatty Acids (FISH OIL) 1000 MG CAPS Take 2 capsules (2,000 mg total) by mouth 2 (two) times daily. (Patient not taking: Reported on 11/18/2017)  . pantoprazole (PROTONIX) 40 MG tablet TAKE 1 TABLET BY MOUTH ONCE A DAY 30 TO 60 MINUTES BEFORE FIRST MEAL OF THE  DAY  (Patient not taking: Reported on 11/18/2017)  . rosuvastatin (CRESTOR) 10 MG tablet Take 5 mg by mouth at bedtime.    FAMILY HISTORY:  His indicated that his mother is alive. He indicated that his father is deceased. He indicated that his sister is deceased. He indicated that his brother is alive.   SOCIAL HISTORY: He  reports that he quit smoking about 5 years ago. His smoking use included cigarettes. He has a 40.00  pack-year smoking history. He has never used smokeless tobacco. He reports that he drinks alcohol. He reports that he does not use drugs.  REVIEW OF SYSTEMS:   Not available  SUBJECTIVE:  Intubated stable mechanical ventilatory support  VITAL SIGNS: BP (!) 131/93   Pulse (!) 109   Temp (!) 97.2 F (36.2 C) (Oral)   Resp (!) 22   Ht 5\' 10"  (1.778 m)   Wt (!) 312 lb 2.7 oz (141.6 kg)   SpO2 100%   BMI 44.79 kg/m   HEMODYNAMICS:    VENTILATOR SETTINGS: Vent Mode: PRVC FiO2 (%):  [100 %] 100 % Set Rate:  [16 bmp] 16 bmp Vt Set:  [580 mL] 580 mL PEEP:  [5 cmH20] 5 cmH20 Plateau Pressure:  [21 cmH20] 21 cmH20  INTAKE / OUTPUT: No intake/output data recorded.  PHYSICAL EXAMINATION: General:  Acutely ill appearing morbidly obese male Neuro:  Awake and following commands on the right HEENT:  [place]/AT, PERRL, EOM-I and MMM Cardiovascular:  RRR, Nl S1/S2 and -M/R/G Lungs:  Coarse BS diffusely Abdomen:  Soft, NT, ND and +BS Musculoskeletal:  -edema and -tenderness Skin:  Intact  LABS:  BMET Recent Labs  Lab 11/18/17 0928 11/18/17 0933  NA 134* 135  K 4.3 4.2  CL 99 100  CO2 23  --   BUN 13 14  CREATININE 0.69 0.50*  GLUCOSE 377* 381*    Electrolytes Recent Labs  Lab 11/18/17 0928  CALCIUM 9.8    CBC Recent Labs  Lab 11/18/17 0928 11/18/17 0933  WBC 9.3  --   HGB 13.6 14.3  HCT 42.5 42.0  PLT 276  --     Coag's Recent Labs  Lab 11/18/17 0928  APTT 31  INR 0.91    Sepsis Markers No results for input(s): LATICACIDVEN, PROCALCITON, O2SATVEN in the last 168 hours.  ABG No results for input(s): PHART, PCO2ART, PO2ART in the last 168 hours.  Liver Enzymes Recent Labs  Lab 11/18/17 0928  AST 24  ALT 34  ALKPHOS 58  BILITOT 1.0  ALBUMIN 3.5    Cardiac Enzymes No results for input(s): TROPONINI, PROBNP in the last 168 hours.  Glucose Recent Labs  Lab 11/18/17 0929  GLUCAP 353*    Imaging Ct Angio Head W Or Wo Contrast  Result  Date: 11/18/2017 CLINICAL DATA:  Stroke.  Suspect cerebral hemorrhage EXAM: CT ANGIOGRAPHY HEAD AND NECK TECHNIQUE: Multidetector CT imaging of the head and neck was performed using the standard protocol during bolus administration of intravenous contrast. Multiplanar CT image reconstructions and MIPs were obtained to evaluate the vascular anatomy. Carotid stenosis measurements (when applicable) are obtained utilizing NASCET criteria, using the distal internal carotid diameter as the denominator. CONTRAST:  25mL ISOVUE-370 IOPAMIDOL (ISOVUE-370) INJECTION 76% COMPARISON:  CT head 11/18/2017 FINDINGS: CTA NECK FINDINGS Aortic arch: Atherosclerotic calcification aortic arch without aneurysm or dissection. Right carotid system: Atherosclerotic calcification right carotid bifurcation and proximal internal carotid artery. Severe stenosis proximal right internal carotid artery. Accurate measurement difficult due to heavily calcified  vessel but this appears to be greater than 90% diameter stenosis. Left carotid system: Mild atherosclerotic disease left carotid bifurcation without significant carotid stenosis. Vertebral arteries: Both vertebral arteries widely patent in the neck without significant stenosis. Severe stenosis distal vertebral artery bilaterally due to heavily calcified plaque. Skeleton: Negative Other neck: Left thyroid mass measures 2.5 x 4 cm with heterogeneous enhancement and coarse calcification, highly suspicious for carcinoma. Partially calcified 10 mm lymph node in the left neck level 3 node possibly metastatic node. Partially calcified left level 3 node 9 mm axial image 111 also suspicious for metastatic disease. Upper chest: 6.6 mm right upper lobe nodule. 5.5 mm right upper lobe nodule posteriorly. Left lung apex clear. Review of the MIP images confirms the above findings CTA HEAD FINDINGS Anterior circulation: Extensive atherosclerotic calcification in the cavernous carotid bilaterally. There  appears to be severe stenosis in the right cavernous carotid and moderate stenosis on the left. Suboptimal opacification of the intracranial arteries due to timing of the injection. Diffuse atherosclerotic disease with mild diffuse narrowing of the left middle cerebral artery. Moderate stenosis left middle cerebral artery branch supplying the temporal lobe. Right middle cerebral artery branches show mild disease. Anterior cerebral arteries patent bilaterally with diffuse disease. Posterior circulation: Heavily calcified plaque in severe stenosis distal vertebral artery bilaterally. Basilar artery diffusely diseased with mild stenosis. Posterior cerebral arteries patent bilaterally without significant stenosis. Venous sinuses: Patent Anatomic variants: None Delayed phase: Not performed Review of the MIP images confirms the above findings IMPRESSION: 1. Severe intracranial and extracranial atherosclerotic disease. No emergent large vessel occlusion 2. Severe stenosis right carotid bifurcation due to calcified plaque, estimated 90% diameter stenosis. No significant left carotid stenosis. Severe stenosis cavernous carotid bilaterally. 3. Both vertebral arteries are patent in the neck with severe calcific stenosis V4 segment bilaterally. 4. 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 Small calcified lymph nodes left neck suspicious for metastatic disease 5. Small lung nodules on the right, possible metastatic disease. Recommend chest CT for further evaluation. 6. These results were called by telephone at the time of interpretation on 11/18/2017 at 10:00 am to Dr. Leonel Ramsay , who verbally acknowledged these results. Electronically Signed   By: Franchot Gallo M.D.   On: 11/18/2017 10:10   Ct Angio Neck W Or Wo Contrast  Result Date: 11/18/2017 CLINICAL DATA:  Stroke.  Suspect cerebral hemorrhage EXAM: CT ANGIOGRAPHY HEAD AND NECK TECHNIQUE: Multidetector CT imaging of the head and neck was performed  using the standard protocol during bolus administration of intravenous contrast. Multiplanar CT image reconstructions and MIPs were obtained to evaluate the vascular anatomy. Carotid stenosis measurements (when applicable) are obtained utilizing NASCET criteria, using the distal internal carotid diameter as the denominator. CONTRAST:  60mL ISOVUE-370 IOPAMIDOL (ISOVUE-370) INJECTION 76% COMPARISON:  CT head 11/18/2017 FINDINGS: CTA NECK FINDINGS Aortic arch: Atherosclerotic calcification aortic arch without aneurysm or dissection. Right carotid system: Atherosclerotic calcification right carotid bifurcation and proximal internal carotid artery. Severe stenosis proximal right internal carotid artery. Accurate measurement difficult due to heavily calcified vessel but this appears to be greater than 90% diameter stenosis. Left carotid system: Mild atherosclerotic disease left carotid bifurcation without significant carotid stenosis. Vertebral arteries: Both vertebral arteries widely patent in the neck without significant stenosis. Severe stenosis distal vertebral artery bilaterally due to heavily calcified plaque. Skeleton: Negative Other neck: Left thyroid mass measures 2.5 x 4 cm with heterogeneous enhancement and coarse calcification, highly suspicious for carcinoma. Partially calcified 10 mm lymph  node in the left neck level 3 node possibly metastatic node. Partially calcified left level 3 node 9 mm axial image 111 also suspicious for metastatic disease. Upper chest: 6.6 mm right upper lobe nodule. 5.5 mm right upper lobe nodule posteriorly. Left lung apex clear. Review of the MIP images confirms the above findings CTA HEAD FINDINGS Anterior circulation: Extensive atherosclerotic calcification in the cavernous carotid bilaterally. There appears to be severe stenosis in the right cavernous carotid and moderate stenosis on the left. Suboptimal opacification of the intracranial arteries due to timing of the injection.  Diffuse atherosclerotic disease with mild diffuse narrowing of the left middle cerebral artery. Moderate stenosis left middle cerebral artery branch supplying the temporal lobe. Right middle cerebral artery branches show mild disease. Anterior cerebral arteries patent bilaterally with diffuse disease. Posterior circulation: Heavily calcified plaque in severe stenosis distal vertebral artery bilaterally. Basilar artery diffusely diseased with mild stenosis. Posterior cerebral arteries patent bilaterally without significant stenosis. Venous sinuses: Patent Anatomic variants: None Delayed phase: Not performed Review of the MIP images confirms the above findings IMPRESSION: 1. Severe intracranial and extracranial atherosclerotic disease. No emergent large vessel occlusion 2. Severe stenosis right carotid bifurcation due to calcified plaque, estimated 90% diameter stenosis. No significant left carotid stenosis. Severe stenosis cavernous carotid bilaterally. 3. Both vertebral arteries are patent in the neck with severe calcific stenosis V4 segment bilaterally. 4. 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 Small calcified lymph nodes left neck suspicious for metastatic disease 5. Small lung nodules on the right, possible metastatic disease. Recommend chest CT for further evaluation. 6. These results were called by telephone at the time of interpretation on 11/18/2017 at 10:00 am to Dr. Leonel Ramsay , who verbally acknowledged these results. Electronically Signed   By: Franchot Gallo M.D.   On: 11/18/2017 10:10   Dg Chest Port 1 View  Result Date: 11/18/2017 CLINICAL DATA:  Stroke symptoms. EXAM: PORTABLE CHEST 1 VIEW COMPARISON:  01/15/2017. FINDINGS: Endotracheal tube noted with tip 5 cm above the carina. NG tube noted with tip below left hemidiaphragm. Cardiomegaly with pulmonary vascular congestion and bilateral interstitial prominence suggesting CHF. No pleural effusion or pneumothorax. IMPRESSION:  1. Endotracheal tube tip noted 5 cm above the carina. NG tube tip noted below left hemidiaphragm. 2. Cardiomegaly with pulmonary venous congestion bilateral interstitial prominence suggesting mild CHF. Electronically Signed   By: Marcello Moores  Register   On: 11/18/2017 11:20   Ct Head Code Stroke Wo Contrast  Result Date: 11/18/2017 CLINICAL DATA:  Code stroke.  Left-sided deficit, slurred speech EXAM: CT HEAD WITHOUT CONTRAST TECHNIQUE: Contiguous axial images were obtained from the base of the skull through the vertex without intravenous contrast. COMPARISON:  None. FINDINGS: Brain: No evidence of acute infarction, hemorrhage, hydrocephalus, extra-axial collection or mass lesion/mass effect. Vascular: Negative for hyperdense vessel. Calcification in distal vertebral and carotid arteries is advanced bilaterally Skull: Negative Sinuses/Orbits: Negative Other: None ASPECTS ([place]uman Stroke Program Early CT Score) - Ganglionic level infarction (caudate, lentiform nuclei, internal capsule, insula, M1-M3 cortex): 7 - Supraganglionic infarction (M4-M6 cortex): 3 Total score (0-10 with 10 being normal): 10 IMPRESSION: 1. No acute intracranial abnormality.  Atherosclerotic calcification 2. ASPECTS is 10 Electronically Signed   By: Franchot Gallo M.D.   On: 11/18/2017 09:54     STUDIES:    CULTURES: 11/18/2017 sputum>>  ANTIBIOTICS: 11/18/2017 Unasyn>> SIGNIFICANT EVENTS: 11/18/2017 intubated  LINES/TUBES: 11/18/2017 endotracheal tube>>  DISCUSSION: [name]  is a 62 year old male with a plethora of  health issues who presented to [facility] on 11/18/2017 after contacting 911 when he noted slurred speech and right facial.  He was transported to [facility] emergency department code stroke was initiated.  He had been on Eliquis in the past for paroxysmal atrial fibrillation have been stopped 3 weeks prior due to hemoptysis. 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  There was concern that TPA could not be given due to recent hemoptysis and erosion of the thyroid nodule into the airway. He was urgently intubated by Dr. Nelda Marseille and erosion site was covered with the balloon cuff.  Patient was then given TPA and will be transferred to the neuro stroke unit. He will most likely remain intubated for period of 24 to 48 hours.  He will need further evaluation for most likely cancerous process in the neck and the lung.  ASSESSMENT / PLAN:  PULMONARY A: Vent dependent respiratory failure secondary to urgent need for intubation for TPA infusion with recent hemoptysis and thyroid erosion and airway secondary to thyroid nodules is increased from 1.40 2.5 to 10 months. Chronic obstructive pulmonary disease Former smoker Multiple lung nodules suspicious for cancer P:   Vent bundle On ventilator minimum of 24 to 48 hours prior to extubation Bronchodilators as needed Wean per protocol sedation as needed   CARDIOVASCULAR A:  History of  Paroxysmal atrial fibrillation, currently off anticoagulation for recent hemoptysis 3 weeks duration Hypertension Coronary artery disease Peripheral vascular disease P:  Monitor in ICU Blood pressure control as needed He is [facility]ology  RENAL Lab Results  Component Value Date   CREATININE 0.50 (L) 11/18/2017   CREATININE 0.69 11/18/2017   CREATININE 0.65 (L) 08/09/2017   Recent Labs  Lab 11/18/17 0928 11/18/17 0933  K 4.3 4.2     A:   No acute renal issues P:   Monitor electrolytes Avoid nephrotoxins  GASTROINTESTINAL A:   History of GERD P:   Proton pump inhibitor Gastric tube May need tube feedings remains on the ventilator along with a 48-hour  HEMATOLOGIC A:   Chronic anticoagulation for atrial fibrillation Off anticoagulation for 3 weeks due to her hemoptysis Received TPA 11/18/2017 for right MCA stroke P:  Observe for  possible hemorrhage Monitor hemoglobin hematocrit  INFECTIOUS A:   Observed aspiration P:   Placed on Unasyn Culture as needed  ENDOCRINE CBG (last 3)  Recent Labs    11/18/17 0929  GLUCAP 353*    A:   Diabetes mellitus P:   Sliding scale insulin  NEUROLOGIC A:   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   FAMILY  - Updates: No family at bedside  - Inter-disciplinary family meet or Palliative Care meeting due by:  day 7    [facility] ACNP Maryanna Shape PCCM Pager [phone] till 1 pm If no answer page [phone] 11/18/2017, 11:28 AM  Attending Note:  62 year old male with a-fib and hemoptysis that was taken off eliquis for hemoptysis and suffered an embolic CVA.  On exam, the patient is responsive and following some commands.  Able to converse and express understanding of procedures with clear lungs.  I reviewed the CT of the neck myself, left sided thyroid  mass has enlarged and is compressing the airway and I am concerned it is eroding into the airway.  Neurology recommended tPA.  I spoke with the patient at length with the neurologist and nursing staff present.  Patient is able to communicate.  After informing him of all choices and risk of dying from tPA with hemoptysis present and he chose tPA over being paralyzed for life.  He also informed us that he would like his girlfriend to be the one signing his consents for him.  Based on that, I bronched the patient, identified the bleeding site at approximately 3 cm under the vocal cords, placed the ETT below the site and inflated the balloon with the hope to tamponade the site before tPA is given.  During intubation the patient vomited and patient was intubated successfully.  Patient was given tPA, subsequently developed some  bleeding from the ETT that now is appears stable.  CT of the chest was done that shows infiltrate in the RUL, I reviewed myself.  Patient neurologically deteriorated and a repeat CT showed a basilar infract.  Neurology consulted with [place] regarding IR intervention.  Patient was evaluated and cleared for that.  ABG will be ordered and vent will be adjusted.  The patient is critically ill with multiple organ systems failure and requires high complexity decision making for assessment and support, frequent evaluation and titration of therapies, application of advanced monitoring technologies and extensive interpretation of multiple databases.   Critical Care Time devoted to patient care services described in this note is  100  Minutes. This time reflects time of care of this signee Dr [name]. This critical care time does not reflect procedure time, or teaching time or supervisory time of PA/NP/Med student/Med Resident etc but could involve care discussion time.  Rush Farmer, M.D. [facility] Pulmonary/Critical Care Medicine. Pager: [phone]. After hours pager: [phone].

## 2017-11-18 NOTE — Code Documentation (Addendum)
Pulmonology, Dr. Nelda Marseille, at the bedside speaking with patient and assessing situation.

## 2017-11-18 NOTE — Plan of Care (Signed)
Pt being transported to stat head CT, portable X2 monitor cannot transduce arterial lines so no ABPs are able to be charted until pt returns.

## 2017-11-18 NOTE — Code Documentation (Addendum)
62 yo male coming from home with sudden onset of left arm weakness and left leg weakness along with slurred speech at 0800. Pt said goodbye to his wife this morning, and after she left, he started to have these symptoms. Pt called 911 and they activated a Code Stroke. Stroke Team met patient upon arrival. Pt taken CT. CT negative for hemorrhage. Initial NIHSS 10 due to flaccid left arm and left leg, left facial palsy, and dysarthria. Pt reports having hx of taking Eliquis and having Afib. Pt stopped taking the Eliquis three weeks ago due to having some bleeding in his throat. After the neurologist spoke with the patient, there was report of the bleeding taken place "a few days ago." Delay due to MD Leonel Ramsay discussing with pulmonology about bleeding in the throat along with review of the chart. Waiting for Pulmonology to come evaluate patient.

## 2017-11-18 NOTE — Progress Notes (Signed)
PT Cancellation Note  Patient Details Name: [name] [name] MRN: [id number] DOB: [birth year]   Cancelled Treatment:    Reason Eval/Treat Not Completed: Patient not medically ready; will check back another day.  Still on vent and just s/p TPA, on bedrest.   Reginia Naas 11/18/2017, 2:14 PM  Magda Kiel, [place] 11/18/2017

## 2017-11-18 NOTE — Code Documentation (Signed)
MD Leonel Ramsay came in the room at 1220 to reevaluate the patient. MD made aware that patient was not moving the right side at this time. Ordered for all sedation to be turned off. All sedation turned off. Patient did not regain ability to move right arm after 15 minutes of sedation being off. MD ordered for [place] to do repeat CT. Patient transported to CT at this time with RT and [place].

## 2017-11-18 NOTE — Progress Notes (Signed)
Patient ID: [name] [name], male   DOB: December 18, 1955, 62 y.o.   MRN: [id number] INR. 53 yr  R H M LSW ? 8 30 pm. Slurred speech and ? Lt sided weakness.. Acute decompensation in the ER requiring intubation. 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 Patient referred for revascularization of occluded basilar artery. Procedure,reasons risks alternatives discussed with patients wife and children. Risks of ICH of 10 %worsening neuro deficit,vent dependency,death ,inability to revascularize .vessle injury fully discussed with spouse. Questions answered to their satisfaction. Informed witnessed consent obtained from wife. S.Yolanda Huffstetler MD.  Patient had no dopplerable pulses in his Lt foot ,but dopplerable DP and PT on the RT prior to the procedure. Following the procedure pulses were absent in both feet. D/W DR Leonel Ramsay. Vascular surgery Dr Bridgett Larsson was consulted regarding patients pulseless feet. S.Macel Yearsley MD

## 2017-11-18 NOTE — Anesthesia Postprocedure Evaluation (Signed)
Anesthesia Post Note  Patient: [name] [name]  Procedure(s) Performed: RADIOLOGY WITH ANESTHESIA (N/A )     Patient location during evaluation: SICU Anesthesia Type: General Level of consciousness: sedated Pain management: pain level controlled Vital Signs Assessment: post-procedure vital signs reviewed and stable Respiratory status: patient remains intubated per anesthesia plan Cardiovascular status: stable Postop Assessment: no apparent nausea or vomiting Anesthetic complications: no    Last Vitals:  Vitals:   11/18/17 1400 11/18/17 1747  BP:  (!) 95/46  Pulse: 89 96  Resp: 20 (!) 26  Temp:    SpO2: 100% 100%    Last Pain:  Vitals:   11/18/17 1215  TempSrc: Axillary  PainSc:                  [name] [name],W. EDMOND

## 2017-11-18 NOTE — Progress Notes (Signed)
ABI's have been completed. Right Unable to obtain ABI due to low amplitude or nonexistent waveforms. Left 0.63 Results were given to Dr. Bridgett Larsson.  11/18/17 6:32 PM [name] [name] RVT

## 2017-11-18 NOTE — Progress Notes (Signed)
Charge RT transported patient to room 4N19 from IR on ventilator. Vitals are stable. RT will continue to monitor.

## 2017-11-18 NOTE — Anesthesia Procedure Notes (Signed)
Arterial Line Insertion Start/End6/27/2019 2:20 PM, 11/18/2017 2:24 PM Performed by: Oleta Mouse, MD  Patient location: OR. Preanesthetic checklist: patient identified, IV checked, site marked, risks and benefits discussed, surgical consent, monitors and equipment checked, pre-op evaluation, timeout performed and anesthesia consent Left, radial was placed Catheter size: 20 G Hand hygiene performed  and maximum sterile barriers used   Attempts: 1 Procedure performed without using ultrasound guided technique. Following insertion, dressing applied and Biopatch. Post procedure assessment: normal and unchanged  Patient tolerated the procedure well with no immediate complications.

## 2017-11-18 NOTE — Progress Notes (Signed)
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

## 2017-11-18 NOTE — Code Documentation (Signed)
tPA delayed due to BP being elevated. 10 mg of Labetalol given to decreased BP. tPA started.

## 2017-11-18 NOTE — Progress Notes (Signed)
Pharmacist Code Stroke Response  Notified to mix tPA at 1045 by Dr. Leonel Ramsay Delivered tPA to RN at 1048  Issues/delays encountered (if applicable): patient required intubation/bronch and consult from pulmonology before decision to give tPA was made.  [name] [name] 11/18/17 10:55 AM

## 2017-11-18 NOTE — H&P (Signed)
NEURO HOSPITALIST      Requesting Physician: Dr. Johnney Killian    Chief Complaint: left side weakness and slurred speech   History obtained from:  Patient /Chart/ girlfriend     HPI:                                                                                                                                         [name] [name] is an 62 y.o. male with PMH HTN, DM2, HLD, afib ( was on eliquis) hemoptysis who presented to [facility] ED as a code stroke with left side weakness and slurred speech.   Per patient and girlfriend he woke up this morning and he felt normal. He walked to the door at 0600 to wave bye bye to his girlfriend. About 830 he called his girlfriend and was having slurred speech. She told him to call EMS. Patient states that it was about 0800 when he started " feeling funny". Patient has a history of coughing up blood in which he states he was removed from his eliquis 3 weeks ago. He reports that his last episode of coughing up blood was a few days ago. Denies any CP, SOB   In the ed:BG: 381, BP: was  161/110 Head CT was obtained and no acute intracranial abnormality. ASPECTS 10. Due to patient having hemoptysis pulmonology was consulted who agreed to intubate and bronch the patient at bedside before TPA decision was made. No LVO seen, not an IR candidate.  No previous history of stroke found during chart review. 04/14/2017 patient had a f/u visit for peripheral angiography and at that time he was on Eliquis and Plavix.   Date last known well: Date: 11/18/2017 Time last known well: Time: 08:00 tPA Given: Yes Modified Rankin: Rankin Score=0  NIHSS:10 1a Level of Conscious:0 1b LOC Questions: 0 1c LOC Commands: 0 2 Best Gaze: 0 3 Visual: 0 4 Facial Palsy: 1 5a Motor Arm - left: 4 5b Motor Arm - Right:  6a Motor Leg - Left: 4 6b Motor Leg - Right: 0 7 Limb Ataxia: 0 8 Sensory: 0 9 Best Language: 0 10 Dysarthria:1 11 Extinct.  and Inattention:0 TOTAL: 10   Past Medical History:  Diagnosis Date  . Collagen vascular disease ([place])   . Coronary artery disease   . GERD (gastroesophageal reflux disease)   . High cholesterol   . Hypertension   . OSA (obstructive sleep apnea)    "dx'd years ago; never have worn mask; mask never ordered" (04/01/2017)  . Pneumonia 2011  . Type II diabetes mellitus ([place]) 07/2016    Past Surgical History:  Procedure Laterality Date  .  CORONARY ANGIOPLASTY WITH STENT PLACEMENT  2011; 07/2016   s/p PCI  . LOWER EXTREMITY ANGIOGRAPHY Bilateral 04/01/2017   Procedure: Lower Extremity Angiography;  Surgeon: Lorretta Harp, MD;  Location: [place] [facility];  Service: Cardiovascular;  Laterality: Bilateral;  . PERIPHERAL VASCULAR INTERVENTION Left 04/01/2017   Procedure: PERIPHERAL VASCULAR INTERVENTION;  Surgeon: Lorretta Harp, MD;  Location: [place] [facility];  Service: Cardiovascular;  Laterality: Left;  common iliac  . VIDEO BRONCHOSCOPY Bilateral 08/25/2016   Procedure: VIDEO BRONCHOSCOPY WITHOUT FLUORO;  Surgeon: Collene Gobble, MD;  Location: [place];  Service: Cardiopulmonary;  Laterality: Bilateral;    Family History  Problem Relation Age of Onset  . Alzheimer's disease Mother   . Breast cancer Sister        Social History:  reports that he quit smoking about 5 years ago. His smoking use included cigarettes. He has a 40.00 pack-year smoking history. He has never used smokeless tobacco. He reports that he drinks alcohol. He reports that he does not use drugs.  Allergies: No Known Allergies  Medications:                                                                                                                          No current facility-administered medications for this encounter.    Current Outpatient Medications  Medication Sig Dispense Refill  . apixaban (ELIQUIS) 5 MG TABS tablet Take 1 tablet (5 mg total) 2 (two) times daily by mouth. 180 tablet 3  . B  Complex Vitamins (VITAMIN B COMPLEX PO) Take 1 capsule at bedtime by mouth.     . clopidogrel (PLAVIX) 75 MG tablet Take 1 tablet (75 mg total) by mouth daily. (Patient taking differently: Take 75 mg every evening by mouth. ) 30 tablet 0  . dronedarone (MULTAQ) 400 MG tablet Take 1 tablet (400 mg total) 2 (two) times daily with a meal by mouth. 180 tablet 3  . famotidine (PEPCID) 20 MG tablet One at bedtime (Patient taking differently: Take 20 mg at bedtime by mouth. One at bedtime) 30 tablet 11  . fenofibrate 160 MG tablet Take 1 tablet (160 mg total) by mouth daily. 90 tablet 1  . irbesartan (AVAPRO) 75 MG tablet TAKE ONE TABLET BY MOUTH ONE TIME DAILY  90 tablet 3  . liraglutide (VICTOZA) 18 MG/3ML SOPN Inject 0.2 mLs (1.2 mg total) into the skin daily at 10 pm. Start with 0.6 mg for one week, then increase to 1.2 mg daily. 10 pen 2  . metFORMIN (GLUCOPHAGE) 1000 MG tablet Take 1,000 mg by mouth 2 (two) times daily with a meal.    . metoprolol tartrate (LOPRESSOR) 50 MG tablet Take 1 tablet (50 mg total) by mouth 2 (two) times daily. 120 tablet 2  . nitroGLYCERIN (NITROSTAT) 0.4 MG SL tablet Place 1 tablet (0.4 mg total) under the tongue every 5 (five) minutes as needed for chest pain. 25 tablet 11  . Omega-3 Fatty Acids (  FISH OIL) 1000 MG CAPS Take 2 capsules (2,000 mg total) by mouth 2 (two) times daily. 180 capsule 6  . pantoprazole (PROTONIX) 40 MG tablet TAKE 1 TABLET BY MOUTH ONCE A DAY 30 TO 60 MINUTES BEFORE FIRST MEAL OF THE DAY  90 tablet 0  . rosuvastatin (CRESTOR) 10 MG tablet Take 5 mg by mouth at bedtime.      ROS:                                                                                                                                       History obtained from the patient  General ROS: negative for - chills, fatigue, fever, night sweats, weight gain or weight loss Ophthalmic ROS: negative for - blurry vision, double vision, eye pain or loss of vision ENT ROS: negative for  - epistaxis, nasal discharge, oral lesions, sore throat, tinnitus or vertigo Respiratory ROS: positive  for - cough,   negative for shortness of breath or wheezing Cardiovascular ROS: negative for - chest pain, dyspnea on exertion,  Musculoskeletal ROS: positive for- left side hemiplegia Neurological ROS: as noted in HPI   General Examination:                                                                                                      Blood pressure (!) 150/87, pulse (!) 104, temperature (!) 97.2 F (36.2 C), temperature source Oral, resp. rate 20, weight (!) 141.6 kg (312 lb 2.7 oz), SpO2 93 %.  HEENT-  Normocephalic, no lesions, without obvious abnormality.  Normal external eye and conjunctiva. Cardiovascular- S1-S2 audible, pulses palpable throughout   Lungs-no rhonchi or wheezing noted, no excessive working breathing.  Saturations within normal limits on 2 L [place] Extremities- Warm, dry and intact Musculoskeletal-no joint tenderness, deformity or swelling Skin-warm and dry intact  Neurological Examination Mental Status: Alert, oriented, thought content appropriate.  Speech fluent without evidence of aphasia. Dysarthria noted.  Able to follow simple commands without difficulty. Cranial Nerves: II:  Visual fields grossly normal,  III,IV, VI: ptosis not present, extra-ocular motions intact bilaterally, pupils equal, round, reactive to light and accommodation V,VII: smile asymmetric, facial light touch sensation normal bilaterally VIII: hearing normal bilaterally IX,X: uvula rises symmetrically XI: bilateral shoulder shrug XII: midline tongue extension Motor: Right : Upper extremity   5/5 Left:   Upper extremity   0/5 Lower extremity   5/5            Lower extremity  0/5 Tone and bulk:normal tone throughout; no atrophy noted Sensory:  light touch intact throughout, bilaterally Deep Tendon Reflexes: 2+ and symmetric throughout Plantars: Right: downgoing   Left:  downgoing Cerebellar: normal finger-to-nose,  Gait: deferred   Lab Results: Basic Metabolic Panel: Recent Labs  Lab 11/18/17 0933  NA 135  K 4.2  CL 100  GLUCOSE 381*  BUN 14  CREATININE 0.50*    CBC: Recent Labs  Lab 11/18/17 0928 11/18/17 0933  WBC 9.3  --   NEUTROABS 5.6  --   HGB 13.6 14.3  HCT 42.5 42.0  MCV 89.1  --   PLT 276  --     Lipid Panel: No results for input(s): CHOL, TRIG, HDL, CHOLHDL, VLDL, LDLCALC in the last 168 hours.  CBG: Recent Labs  Lab 11/18/17 0929  GLUCAP 353*    Imaging: Ct Head Code Stroke Wo Contrast  Result Date: 11/18/2017 CLINICAL DATA:  Code stroke.  Left-sided deficit, slurred speech EXAM: CT HEAD WITHOUT CONTRAST TECHNIQUE: Contiguous axial images were obtained from the base of the skull through the vertex without intravenous contrast. COMPARISON:  None. FINDINGS: Brain: No evidence of acute infarction, hemorrhage, hydrocephalus, extra-axial collection or mass lesion/mass effect. Vascular: Negative for hyperdense vessel. Calcification in distal vertebral and carotid arteries is advanced bilaterally Skull: Negative Sinuses/Orbits: Negative Other: None ASPECTS ([place] Stroke Program Early CT Score) - Ganglionic level infarction (caudate, lentiform nuclei, internal capsule, insula, M1-M3 cortex): 7 - Supraganglionic infarction (M4-M6 cortex): 3 Total score (0-10 with 10 being normal): 10 IMPRESSION: 1. No acute intracranial abnormality.  Atherosclerotic calcification 2. ASPECTS is 10 Electronically Signed   By: Franchot Gallo M.D.   On: 11/18/2017 09:54       Laurey Morale, MSN, NP-C [facility] [phone]  11/18/2017, 10:10 AM   Attending physician note to follow with Assessment and plan .  I have seen the patient reviewed the above note.  With no LVO on initial CTA and few other treatment options, I discussed with pulmonology the possibility of securing his airway to help mitigate any risk from hemoptysis  given he had a clear lesion likely responsible for his hemoptysis.  We discussed the risks with the patient including a significantly increased risk of hemorrhage from his tracheal site, but given the severity of his symptoms, I did offer TPA and the patient agreed.  At this point, he still had a pure motor syndrome and was cognitively intact.  He indicated that should he be rendered unable to make his own decisions, he would want his girlfriend to make decisions for him.  He was intubated given IV TPA.  Initially he was doing okay, however he began coughing repeatedly requiring additional sedation to help ventilate him.  He stopped following commands, but this was initially attributed to the additional sedation.  However, once sedation was paused it became clear that he had significantly worsened and now was quadriplegic.  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  Assessment: Motty Borin is an 62 y.o. 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  Stroke Risk Factors -  diabetes mellitus, hyperlipidemia and hypertension   Recommend -- BP goal : Permissive HTN upto  post tPA is 180/129mmHg, --MRI Brain  --Echocardiogram -- Hold ASA  -- High intensity statin LDL> 70 -- HgbA1c, fasting lipid panel -- PT consult, OT consult, Speech consult --Telemetry monitoring --Frequent neuro checks --NPO until official Stroke swallow screen after extubation  --please page stroke NP  Or  PA  Or MD from 8am -4 pm  as this patient from this time will be   followed by the stroke.   You can look them up on [email or web address]  Password [email or web address]  This patient is critically ill and at significant risk of neurological worsening, death and care requires constant monitoring of vital signs, hemodynamics,respiratory and cardiac monitoring, neurological assessment, discussion with family, other specialists and medical decision making of high complexity. I spent 120 minutes of neurocritical care time  in the care of  this patient.  Roland Rack, MD [facility] [phone]  If 7pm- 7am, please page neurology on call as listed in [place]. 11/18/2017  3:46 PM

## 2017-11-18 NOTE — Progress Notes (Signed)
Paged CCM MD re: ABG results, awaiting return call.  Report called and given to ICU RT.

## 2017-11-18 NOTE — Plan of Care (Signed)
On arrival to 4N19, pt had no pulses by doppler in the bilateral lower extremities. IR aware, vascular consulted. Sheath site level 0. Neuro exam limited due to paralytics still being on board.

## 2017-11-18 NOTE — Progress Notes (Signed)
Telephone and bedside report given to 4N RN. Rt femoral groin site observed with 4N RN, level 0, unremarkable.

## 2017-11-18 NOTE — Procedures (Signed)
Intubation Procedure Note [name] [name] [id number] Mar 25, 1956  Procedure: Intubation Indications: Airway protection and maintenance  Procedure Details Consent: Risks of procedure as well as the alternatives and risks of each were explained to the (patient/caregiver).  Consent for procedure obtained. Time Out: Verified patient identification, verified procedure, site/side was marked, verified correct patient position, special equipment/implants available, medications/allergies/relevent history reviewed, required imaging and test results available.  Performed  Maximum sterile technique was used including antiseptics, gloves, hand hygiene and mask.  MAC    Evaluation Hemodynamic Status: BP stable throughout; O2 sats: stable throughout Patient's Current Condition: stable Complications: No apparent complications Patient did tolerate procedure well. Chest X-ray ordered to verify placement.  CXR: tube position acceptable.   [name] [name] 11/18/2017

## 2017-11-18 NOTE — Consult Note (Addendum)
Requested by:  Dr. Luanne Bras  Reason for consultation: missing pedal signals    History of Present Illness   [name] [name] is a 62 y.o. (08-25-1955) male w/ known PAD who presents with cc: stroke.  Patient is intubated and paralyzed so history is obtained from chart.  Patient presented with stroke sx.  Due erosion in airway on bronchoscopy, the patient was intubate with the balloon cuff tamponading the erosion as tPA was given.  The reported had progression in sx.  Repeat CTA suggested basilar artery occlusion.  Pt went to IR for basilar intervention via R CFA approached.  Per IR, no signals in L foot, +signals in R foot.  After procedure, no signals in either foot. The patient was re-paralyzed prior transport back to Neuro ICU.   Past Medical History:  Diagnosis Date  . Collagen vascular disease ([place])   . Coronary artery disease   . GERD (gastroesophageal reflux disease)   . High cholesterol   . Hypertension   . OSA (obstructive sleep apnea)    "dx'd years ago; never have worn mask; mask never ordered" (04/01/2017)  . Pneumonia 2011  . Type II diabetes mellitus ([place]) 07/2016    Past Surgical History:  Procedure Laterality Date  . CORONARY ANGIOPLASTY WITH STENT PLACEMENT  2011; 07/2016   s/p PCI  . LOWER EXTREMITY ANGIOGRAPHY Bilateral 04/01/2017   Procedure: Lower Extremity Angiography;  Surgeon: Lorretta Harp, MD;  Location: [facility];  Service: Cardiovascular;  Laterality: Bilateral;  . PERIPHERAL VASCULAR INTERVENTION Left 04/01/2017   Procedure: PERIPHERAL VASCULAR INTERVENTION;  Surgeon: Lorretta Harp, MD;  Location: [facility];  Service: Cardiovascular;  Laterality: Left;  common iliac  . VIDEO BRONCHOSCOPY Bilateral 08/25/2016   Procedure: VIDEO BRONCHOSCOPY WITHOUT FLUORO;  Surgeon: Collene Gobble, MD;  Location: [facility];  Service: Cardiopulmonary;  Laterality: Bilateral;     Social History   Socioeconomic History  . Marital  status: Divorced    Spouse name: Not on file  . Number of children: Not on file  . Years of education: Not on file  . Highest education level: Not on file  Occupational History  . Not on file  Social Needs  . Financial resource strain: Not on file  . Food insecurity:    Worry: Not on file    Inability: Not on file  . Transportation needs:    Medical: Not on file    Non-medical: Not on file  Tobacco Use  . Smoking status: Former Smoker    Packs/day: 1.00    Years: 40.00    Pack years: 40.00    Types: Cigarettes    Last attempt to quit: 2014    Years since quitting: 5.4  . Smokeless tobacco: Never Used  Substance and Sexual Activity  . Alcohol use: Yes    Comment: 04/01/2017 "couple drinks once/month"  . Drug use: No    Types: Marijuana    Comment: marijuana in the 70s  . Sexual activity: Not Currently  Lifestyle  . Physical activity:    Days per week: Not on file    Minutes per session: Not on file  . Stress: Not on file  Relationships  . Social connections:    Talks on phone: Not on file    Gets together: Not on file    Attends religious service: Not on file    Active member of club or organization: Not on file    Attends meetings of clubs  or organizations: Not on file    Relationship status: Not on file  . Intimate partner violence:    Fear of current or ex partner: Not on file    Emotionally abused: Not on file    Physically abused: Not on file    Forced sexual activity: Not on file  Other Topics Concern  . Not on file  Social History Narrative  . Not on file    Family History  Problem Relation Age of Onset  . Alzheimer's disease Mother   . Breast cancer Sister     Current Facility-Administered Medications  Medication Dose Route Frequency Provider Last Rate Last Dose  . 0.9 %  sodium chloride infusion  50 mL/hr Intravenous Continuous Greta Doom, MD   Stopped at 11/18/17 1827  . 0.9 %  sodium chloride infusion   Intravenous Continuous  Luanne Bras, MD 100 mL/hr at 11/18/17 1857    . acetaminophen (TYLENOL) tablet 650 mg  650 mg Oral Q4H PRN Deveshwar, Willaim Rayas, MD       Or  . acetaminophen (TYLENOL) solution 650 mg  650 mg Per Tube Q4H PRN Deveshwar, Willaim Rayas, MD       Or  . acetaminophen (TYLENOL) suppository 650 mg  650 mg Rectal Q4H PRN Deveshwar, Sanjeev, MD      . Ampicillin-Sulbactam (UNASYN) 3 g in sodium chloride 0.9 % 100 mL IVPB  3 g Intravenous Q6H Bajbus, Lauren D, RPH   Stopped at 11/18/17 1844  . [START ON 11/19/2017] aspirin chewable tablet 81 mg  81 mg Oral Daily Deveshwar, Willaim Rayas, MD       Or  . Derrill Memo ON 11/19/2017] aspirin chewable tablet 81 mg  81 mg Per Tube Daily Deveshwar, Sanjeev, MD      . aspirin chewable tablet    PRN Luanne Bras, MD   81 mg at 11/18/17 1459  . chlorhexidine gluconate (MEDLINE KIT) (PERIDEX) 0.12 % solution 15 mL  15 mL Mouth Rinse BID Tarry Kos, MD      . eptifibatide (INTEGRILIN) injection    PRN Luanne Bras, MD   1.5 mg at 11/18/17 1534  . fentaNYL (SUBLIMAZE) bolus via infusion 50 mcg  50 mcg Intravenous Q1H PRN Rush Farmer, MD      . fentaNYL (SUBLIMAZE) injection 50 mcg  50 mcg Intravenous Once Rush Farmer, MD   Stopped at 11/18/17 1226  . fentaNYL 2535mg in NS 2521m(1035mml) infusion-PREMIX  25-400 mcg/hr Intravenous Continuous YacRush FarmerD 2.5 mL/hr at 11/18/17 1857 25 mcg/hr at 11/18/17 1857  . insulin regular (NOVOLIN R,HUMULIN R) 100 Units in sodium chloride 0.9 % 100 mL (1 Units/mL) infusion   Intravenous Continuous KirGreta DoomD 7.2 mL/hr at 11/18/17 1857 7.2 Units/hr at 11/18/17 1857  . iopamidol (ISOVUE-370) 76 % injection           . labetalol (NORMODYNE,TRANDATE) injection 10 mg  10 mg Intravenous Once PRN KirGreta DoomD       And  . nicardipine (CARDENE) 24m33m 0.86% saline 200ml28minfusion (0.1 mg/ml)  0-15 mg/hr Intravenous Continuous PRN KirkpGreta Doom     . MEDLINE mouth rinse   15 mL Mouth Rinse 10 times per day RosenTarry Kos     . nicardipine (CARDENE) 24mg 70m.86% saline 200ml I58mfusion (0.1 mg/ml)  0-15 mg/hr Intravenous Continuous Deveshwar, Sanjeev, MD      . nitroGLYCERIN 1 mg/10 ml (100 mcg/ml) -  IR/CATH LAB    PRN Luanne Bras, MD   25 mcg at 11/18/17 1650  . pantoprazole (PROTONIX) EC tablet 40 mg  40 mg Oral Daily Greta Doom, MD   Stopped at 11/18/17 1416  . phenylephrine (NEOSYNEPHRINE) 10-0.9 MG/250ML-% infusion  0-400 mcg/min Intravenous Titrated Greta Doom, MD   Stopped at 11/18/17 1825  . propofol (DIPRIVAN) 1000 MG/100ML infusion  5-80 mcg/kg/min Intravenous Titrated Rush Farmer, MD 4.2 mL/hr at 11/18/17 1857 5 mcg/kg/min at 11/18/17 1857  . [START ON 11/19/2017] ticagrelor (BRILINTA) tablet 90 mg  90 mg Oral BID Luanne Bras, MD       Or  . Derrill Memo ON 11/19/2017] ticagrelor (BRILINTA) tablet 90 mg  90 mg Per Tube BID Luanne Bras, MD      . ticagrelor Kary Kos) tablet    PRN Luanne Bras, MD   180 mg at 11/18/17 1459    No Known Allergies  REVIEW OF SYSTEMS (negative unless checked): Cannot obtain as pt intubated    Physical Examination     Vitals:   11/18/17 1800 11/18/17 1815 11/18/17 1830 11/18/17 1845  BP: (!) 106/49 137/75 113/65 119/62  Pulse: 91 85 93 100  Resp: (!) 26 (!) 26 (!) 26 (!) 23  Temp:      TempSrc:      SpO2: 97% 100% 99% 97%  Weight:      Height:       Body mass index is 44.79 kg/m.  General non-responsive, obese, intubated  Head [place]/AT,   Ear/Nose/ Throat Intubated, unable to evaluate oropharynx, no able to evaluate hearing  Eyes Eyelids closed, non-responsive  Neck Midline trachea,   Pulmonary Sym exp, mechanical vent sounds Vent Mode: PRVC FiO2 (%):  [50 %-100 %] 50 % Set Rate:  [16 bmp-26 bmp] 26 bmp Vt Set:  [580 mL] 580 mL PEEP:  [5 cmH20-10 cmH20] 10 cmH20 Plateau Pressure:  [17 cmH20-22 cmH20] 17 cmH20  Cardiac Irregularly, irregular  rate and rhythm, no Murmurs, No rubs, No S3,S4  Vascular Vessel Right Left  Radial Palpable Palpable  Brachial Palpable Palpable  Carotid Palpable, No Bruit Palpable, No Bruit  Aorta Not palpable N/A  Femoral Not palpable as sheath in place Palpable  Popliteal Not palpable Not palpable  PT Not palpable Not palpable  DP Not palpable Not palpable    Gastro- intestinal soft, non-distended, non-tender to palpation, No guarding or rebound, no HSM, no masses, no CVAT B, No palpable prominent aortic pulse,    Musculo- skeletal Motor exam not possible, all limbs flaccid currently, Both legs warm down to calves, CR <3 sec in both feet, no frank cyanosis in either feet, R femoral sheath in place  Neurologic Exam not possible due to paralysis  Psychiatric Exam not possible due to intubation and paralysis  Dermatologic No obvious rashes, no frank evidence of ischemic in either foot  Lymphatic  Palpable lymph nodes: None   Radiology     Ct Angio Head W Or Wo Contrast  Result Date: 11/18/2017 CLINICAL DATA:  Clinical deterioration. Concern regarding basilar thrombosis. EXAM: CT ANGIOGRAPHY HEAD AND NECK TECHNIQUE: Multidetector CT imaging of the head and neck was performed using the standard protocol during bolus administration of intravenous contrast. Multiplanar CT image reconstructions and MIPs were obtained to evaluate the vascular anatomy. Carotid stenosis measurements (when applicable) are obtained utilizing NASCET criteria, using the distal internal carotid diameter as the denominator. CONTRAST:  127m ISOVUE-370 IOPAMIDOL (ISOVUE-370) INJECTION 76% COMPARISON:  Earlier same day. FINDINGS:  CT HEAD FINDINGS Brain: No change when compared to the previous head CT. Generalized atrophy. Chronic small-vessel changes of the pons and hemispheric white matter. No sign of acute infarction by CT. No hemorrhage. No hydrocephalus or extra-axial collection. Vascular: Extensive atherosclerotic calcification of  the major vessels at the base the brain. Skull: Negative Sinuses: Clear Orbits: Negative Review of the MIP images confirms the above findings CTA NECK FINDINGS Aortic arch: Atherosclerotic change without aneurysm or dissection. Right carotid system: Common carotid artery widely patent to the bifurcation region. Advanced calcified and soft plaque at the carotid bifurcation and proximal ICA. Than what diameter of the proximal ICA is 1 mm or less, consistent with 90% or greater stenosis. Beyond that, vessel shows reduced caliber but does show antegrade flow. Left carotid system: Common carotid artery shows some atherosclerotic plaque but no stenosis. At the left carotid bifurcation, there is soft and calcified plaque. At the distal bulb, minimal diameter is 3.5 mm. Compared to a more distal cervical ICA diameter of 5 mm, this indicates a 30% stenosis. Cervical ICA widely patent beyond that. Vertebral arteries: Vertebral artery origins are patent. Vertebral arteries are fairly small vessels but are patent through the cervical region to foramen magnum. Skeleton: No significant bone finding. 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 See prior report. Upper chest: New consolidation in the right upper lobe. Possible small pulmonary nodules. Superior mediastinal adenopathy. See prior report. Review of the MIP images confirms the above findings CTA HEAD FINDINGS Anterior circulation: Both internal carotid arteries are patent through the siphon regions with extensive atherosclerotic calcification. Stenosis in the siphon regions is estimated at 50-70% on each side. Supraclinoid internal carotid arteries are patent. Flow is seen in both anterior and middle cerebral vessels without discernible missing branches. Posterior circulation: Severe calcified atherosclerotic disease of the V4 segments on each side with severe stenoses. Accurate  measurement not possible, but stenosis is quite likely approaching 90% on each side. There is a proximal basilar stenosis estimated at 70%. Since the study of earlier today, there is occlusion of the distal basilar artery proximal to the superior cerebellar and the posterior cerebral arteries. These vessels do show flow, presumably indicating patent posterior communicating vessels. Venous sinuses: Patent and normal. Anatomic variants: None other significant. Delayed phase: No abnormal enhancement. Review of the MIP images confirms the above findings IMPRESSION: Since the study of earlier today, there are only 2 changes. There is new consolidation and volume loss in the right upper lobe. 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 Flow is seen distal to that however, presumably indicating patent posterior communicating arteries. The study was discussed at the time of performance with Dr. Leonel Ramsay. Electronically Signed   By: Nelson Chimes M.D.   On: 11/18/2017 13:41   Ct Angio Head W Or Wo Contrast  Result Date: 11/18/2017 CLINICAL DATA:  Stroke.  Suspect cerebral hemorrhage EXAM: CT ANGIOGRAPHY HEAD AND NECK TECHNIQUE: Multidetector CT imaging of the head and neck was performed using the standard protocol during bolus administration of intravenous contrast. Multiplanar CT image reconstructions and MIPs were obtained to evaluate the vascular anatomy. Carotid stenosis measurements (when applicable) are obtained utilizing NASCET criteria, using the distal internal carotid diameter as the denominator. CONTRAST:  38m ISOVUE-370 IOPAMIDOL (ISOVUE-370) INJECTION 76% COMPARISON:  CT head 11/18/2017 FINDINGS: CTA NECK FINDINGS Aortic arch: Atherosclerotic calcification aortic arch without aneurysm or dissection. Right carotid system: Atherosclerotic calcification  right carotid bifurcation and proximal internal carotid artery. Severe stenosis proximal right internal carotid artery. Accurate measurement difficult  due to heavily calcified vessel but this appears to be greater than 90% diameter stenosis. Left carotid system: Mild atherosclerotic disease left carotid bifurcation without significant carotid stenosis. Vertebral arteries: Both vertebral arteries widely patent in the neck without significant stenosis. Severe stenosis distal vertebral artery bilaterally due to heavily calcified plaque. Skeleton: Negative Other neck: Left thyroid mass measures 2.5 x 4 cm with heterogeneous enhancement and coarse calcification, highly suspicious for carcinoma. Partially calcified 10 mm lymph node in the left neck level 3 node possibly metastatic node. Partially calcified left level 3 node 9 mm axial image 111 also suspicious for metastatic disease. Upper chest: 6.6 mm right upper lobe nodule. 5.5 mm right upper lobe nodule posteriorly. Left lung apex clear. Review of the MIP images confirms the above findings CTA HEAD FINDINGS Anterior circulation: Extensive atherosclerotic calcification in the cavernous carotid bilaterally. There appears to be severe stenosis in the right cavernous carotid and moderate stenosis on the left. Suboptimal opacification of the intracranial arteries due to timing of the injection. Diffuse atherosclerotic disease with mild diffuse narrowing of the left middle cerebral artery. Moderate stenosis left middle cerebral artery branch supplying the temporal lobe. Right middle cerebral artery branches show mild disease. Anterior cerebral arteries patent bilaterally with diffuse disease. Posterior circulation: Heavily calcified plaque in severe stenosis distal vertebral artery bilaterally. Basilar artery diffusely diseased with mild stenosis. Posterior cerebral arteries patent bilaterally without significant stenosis. Venous sinuses: Patent Anatomic variants: None Delayed phase: Not performed Review of the MIP images confirms the above findings IMPRESSION: 1. Severe intracranial and extracranial atherosclerotic  disease. No emergent large vessel occlusion 2. Severe stenosis right carotid bifurcation due to calcified plaque, estimated 90% diameter stenosis. No significant left carotid stenosis. Severe stenosis cavernous carotid bilaterally. 3. Both vertebral arteries are patent in the neck with severe calcific stenosis V4 segment bilaterally. 4. 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 Small calcified lymph nodes left neck suspicious for metastatic disease 5. Small lung nodules on the right, possible metastatic disease. Recommend chest CT for further evaluation. 6. These results were called by telephone at the time of interpretation on 11/18/2017 at 10:00 am to Dr. Leonel Ramsay , who verbally acknowledged these results. Electronically Signed   By: Franchot Gallo M.D.   On: 11/18/2017 10:10   Ct Angio Neck W Or Wo Contrast  Result Date: 11/18/2017 CLINICAL DATA:  Clinical deterioration. Concern regarding basilar thrombosis. EXAM: CT ANGIOGRAPHY HEAD AND NECK TECHNIQUE: Multidetector CT imaging of the head and neck was performed using the standard protocol during bolus administration of intravenous contrast. Multiplanar CT image reconstructions and MIPs were obtained to evaluate the vascular anatomy. Carotid stenosis measurements (when applicable) are obtained utilizing NASCET criteria, using the distal internal carotid diameter as the denominator. CONTRAST:  130m ISOVUE-370 IOPAMIDOL (ISOVUE-370) INJECTION 76% COMPARISON:  Earlier same day. FINDINGS: CT HEAD FINDINGS Brain: No change when compared to the previous head CT. Generalized atrophy. Chronic small-vessel changes of the pons and hemispheric white matter. No sign of acute infarction by CT. No hemorrhage. No hydrocephalus or extra-axial collection. Vascular: Extensive atherosclerotic calcification of the major vessels at the base the brain. Skull: Negative Sinuses: Clear Orbits: Negative Review of the MIP images confirms the above findings CTA  NECK FINDINGS Aortic arch: Atherosclerotic change without aneurysm or dissection. Right carotid system: Common carotid artery widely patent to the bifurcation region. Advanced  calcified and soft plaque at the carotid bifurcation and proximal ICA. Than what diameter of the proximal ICA is 1 mm or less, consistent with 90% or greater stenosis. Beyond that, vessel shows reduced caliber but does show antegrade flow. Left carotid system: Common carotid artery shows some atherosclerotic plaque but no stenosis. At the left carotid bifurcation, there is soft and calcified plaque. At the distal bulb, minimal diameter is 3.5 mm. Compared to a more distal cervical ICA diameter of 5 mm, this indicates a 30% stenosis. Cervical ICA widely patent beyond that. Vertebral arteries: Vertebral artery origins are patent. Vertebral arteries are fairly small vessels but are patent through the cervical region to foramen magnum. Skeleton: No significant bone finding. 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 See prior report. Upper chest: New consolidation in the right upper lobe. Possible small pulmonary nodules. Superior mediastinal adenopathy. See prior report. Review of the MIP images confirms the above findings CTA HEAD FINDINGS Anterior circulation: Both internal carotid arteries are patent through the siphon regions with extensive atherosclerotic calcification. Stenosis in the siphon regions is estimated at 50-70% on each side. Supraclinoid internal carotid arteries are patent. Flow is seen in both anterior and middle cerebral vessels without discernible missing branches. Posterior circulation: Severe calcified atherosclerotic disease of the V4 segments on each side with severe stenoses. Accurate measurement not possible, but stenosis is quite likely approaching 90% on each side. There is a proximal basilar stenosis estimated at 70%. Since the  study of earlier today, there is occlusion of the distal basilar artery proximal to the superior cerebellar and the posterior cerebral arteries. These vessels do show flow, presumably indicating patent posterior communicating vessels. Venous sinuses: Patent and normal. Anatomic variants: None other significant. Delayed phase: No abnormal enhancement. Review of the MIP images confirms the above findings IMPRESSION: Since the study of earlier today, there are only 2 changes. There is new consolidation and volume loss in the right upper lobe. 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 Flow is seen distal to that however, presumably indicating patent posterior communicating arteries. The study was discussed at the time of performance with Dr. Leonel Ramsay. Electronically Signed   By: Nelson Chimes M.D.   On: 11/18/2017 13:41   Ct Angio Neck W Or Wo Contrast  Result Date: 11/18/2017 CLINICAL DATA:  Stroke.  Suspect cerebral hemorrhage EXAM: CT ANGIOGRAPHY HEAD AND NECK TECHNIQUE: Multidetector CT imaging of the head and neck was performed using the standard protocol during bolus administration of intravenous contrast. Multiplanar CT image reconstructions and MIPs were obtained to evaluate the vascular anatomy. Carotid stenosis measurements (when applicable) are obtained utilizing NASCET criteria, using the distal internal carotid diameter as the denominator. CONTRAST:  47m ISOVUE-370 IOPAMIDOL (ISOVUE-370) INJECTION 76% COMPARISON:  CT head 11/18/2017 FINDINGS: CTA NECK FINDINGS Aortic arch: Atherosclerotic calcification aortic arch without aneurysm or dissection. Right carotid system: Atherosclerotic calcification right carotid bifurcation and proximal internal carotid artery. Severe stenosis proximal right internal carotid artery. Accurate measurement difficult due to heavily calcified vessel but this appears to be greater than 90% diameter stenosis. Left carotid system: Mild atherosclerotic disease left  carotid bifurcation without significant carotid stenosis. Vertebral arteries: Both vertebral arteries widely patent in the neck without significant stenosis. Severe stenosis distal vertebral artery bilaterally due to heavily calcified plaque. Skeleton: Negative Other neck: Left thyroid mass measures 2.5 x 4 cm with heterogeneous enhancement and coarse calcification, highly suspicious for  carcinoma. Partially calcified 10 mm lymph node in the left neck level 3 node possibly metastatic node. Partially calcified left level 3 node 9 mm axial image 111 also suspicious for metastatic disease. Upper chest: 6.6 mm right upper lobe nodule. 5.5 mm right upper lobe nodule posteriorly. Left lung apex clear. Review of the MIP images confirms the above findings CTA HEAD FINDINGS Anterior circulation: Extensive atherosclerotic calcification in the cavernous carotid bilaterally. There appears to be severe stenosis in the right cavernous carotid and moderate stenosis on the left. Suboptimal opacification of the intracranial arteries due to timing of the injection. Diffuse atherosclerotic disease with mild diffuse narrowing of the left middle cerebral artery. Moderate stenosis left middle cerebral artery branch supplying the temporal lobe. Right middle cerebral artery branches show mild disease. Anterior cerebral arteries patent bilaterally with diffuse disease. Posterior circulation: Heavily calcified plaque in severe stenosis distal vertebral artery bilaterally. Basilar artery diffusely diseased with mild stenosis. Posterior cerebral arteries patent bilaterally without significant stenosis. Venous sinuses: Patent Anatomic variants: None Delayed phase: Not performed Review of the MIP images confirms the above findings IMPRESSION: 1. Severe intracranial and extracranial atherosclerotic disease. No emergent large vessel occlusion 2. Severe stenosis right carotid bifurcation due to calcified plaque, estimated 90% diameter stenosis. No  significant left carotid stenosis. Severe stenosis cavernous carotid bilaterally. 3. Both vertebral arteries are patent in the neck with severe calcific stenosis V4 segment bilaterally. 4. 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 Small calcified lymph nodes left neck suspicious for metastatic disease 5. Small lung nodules on the right, possible metastatic disease. Recommend chest CT for further evaluation. 6. These results were called by telephone at the time of interpretation on 11/18/2017 at 10:00 am to Dr. Leonel Ramsay , who verbally acknowledged these results. Electronically Signed   By: Franchot Gallo M.D.   On: 11/18/2017 10:10   Ct Chest W Contrast  Result Date: 11/18/2017 CLINICAL DATA:  Unable to follow commands. EXAM: CT CHEST WITH CONTRAST TECHNIQUE: Multidetector CT imaging of the chest was performed during intravenous contrast administration. CONTRAST:  130m ISOVUE-370 IOPAMIDOL (ISOVUE-370) INJECTION 76% COMPARISON:  Radiograph of same day.  CT scan of August 23, 2016. FINDINGS: Cardiovascular: Atherosclerosis of thoracic aorta is noted without aneurysm formation. Coronary artery calcifications are noted. No pericardial effusion is noted. Mild cardiomegaly is noted. Mediastinum/Nodes: Endotracheal and nasogastric tubes are in grossly good position. Stable 1 cm lymph node seen in aortopulmonary window. Stable 1.3 cm right paratracheal lymph node. Stable 1 cm right subcarinal lymph node. Bilateral calcified thyroid nodules are again noted. The largest measures 2.8 cm on the left. Lungs/Pleura: No pneumothorax or significant pleural effusion is noted. Right upper lobe airspace opacity with air bronchograms is noted concerning for pneumonia or atelectasis. Minimal bibasilar subsegmental atelectasis is noted. Stable 8 mm nodule seen in lingular segment of left upper lobe. Upper Abdomen: No acute abnormality. Musculoskeletal: No chest wall abnormality. No acute or significant osseous  findings. IMPRESSION: Right upper lobe airspace opacity is noted with air bronchograms concerning for pneumonia or possibly atelectasis. Stable mediastinal adenopathy is noted compared to prior exam. Endotracheal and nasogastric tubes are in grossly good position. Coronary artery calcifications are noted suggesting coronary artery disease. Possible 2.8 cm left thyroid nodule. Thyroid ultrasound is recommended for further evaluation. Aortic Atherosclerosis (ICD10-I70.0). Electronically Signed   By: JMarijo Conception M.D.   On: 11/18/2017 13:47   Dg Chest Port 1 View  Result Date: 11/18/2017 CLINICAL DATA:  Stroke symptoms.  EXAM: PORTABLE CHEST 1 VIEW COMPARISON:  01/15/2017. FINDINGS: Endotracheal tube noted with tip 5 cm above the carina. NG tube noted with tip below left hemidiaphragm. Cardiomegaly with pulmonary vascular congestion and bilateral interstitial prominence suggesting CHF. No pleural effusion or pneumothorax. IMPRESSION: 1. Endotracheal tube tip noted 5 cm above the carina. NG tube tip noted below left hemidiaphragm. 2. Cardiomegaly with pulmonary venous congestion bilateral interstitial prominence suggesting mild CHF. Electronically Signed   By: Marcello Moores  Register   On: 11/18/2017 11:20   Ct Head Code Stroke Wo Contrast  Result Date: 11/18/2017 CLINICAL DATA:  Code stroke.  Left-sided deficit, slurred speech EXAM: CT HEAD WITHOUT CONTRAST TECHNIQUE: Contiguous axial images were obtained from the base of the skull through the vertex without intravenous contrast. COMPARISON:  None. FINDINGS: Brain: No evidence of acute infarction, hemorrhage, hydrocephalus, extra-axial collection or mass lesion/mass effect. Vascular: Negative for hyperdense vessel. Calcification in distal vertebral and carotid arteries is advanced bilaterally Skull: Negative Sinuses/Orbits: Negative Other: None ASPECTS ([place] Stroke Program Early CT Score) - Ganglionic level infarction (caudate, lentiform nuclei, internal  capsule, insula, M1-M3 cortex): 7 - Supraganglionic infarction (M4-M6 cortex): 3 Total score (0-10 with 10 being normal): 10 IMPRESSION: 1. No acute intracranial abnormality.  Atherosclerotic calcification 2. ASPECTS is 10 Electronically Signed   By: Franchot Gallo M.D.   On: 11/18/2017 09:54    Laboratory   CBC CBC Latest Ref Rng & Units 11/18/2017 11/18/2017 11/18/2017  WBC 4.0 - 10.5 K/uL - - 9.3  Hemoglobin 13.0 - 17.0 g/dL 11.5(L) 14.3 13.6  Hematocrit 39.0 - 52.0 % 36.2(L) 42.0 42.5  Platelets 150 - 400 K/uL - - 276    BMP BMP Latest Ref Rng & Units 11/18/2017 11/18/2017 08/09/2017  Glucose 70 - 99 mg/dL 381(H) 377(H) 199(H)  BUN 8 - 23 mg/dL _0 Creatinine 0.61 - 1.24 mg/dL 0.50(L) 0.69 0.65(L)  BUN/Creat Ratio 10 - 24 - - 14  Sodium 135 - 145 mmol/L 135 134(L) 138  Potassium 3.5 - 5.1 mmol/L 4.2 4.3 4.8  Chloride 98 - 111 mmol/L 100 99 96  CO2 22 - 32 mmol/L - 23 26  Calcium 8.9 - 10.3 mg/dL - 9.8 10.0    Coagulation Lab Results  Component Value Date   INR 0.91 11/18/2017   INR 1.0 03/26/2017   INR 1.01 08/26/2016   No results found for: PTT  Lipids    Component Value Date/Time   CHOL 119 08/09/2017 1000   TRIG 424 (H) 11/18/2017 1339   HDL 29 (L) 08/09/2017 1000   CHOLHDL 4.1 08/09/2017 1000   LDLCALC 30 08/09/2017 1000    Non-invasive Vascular Imaging   ABI (11/18/2017)  R: None  L: 0.63   Medical Decision Making   [name] [name] is a 62 y.o. 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  I am not certain that chasing the perfusion in the right foot is likely to improve his  outcome.  It is unclear to me the exact etiology of this patient's loss of R leg ABI.  Obviously the right common femoral artery sheath would exacerbate the flow on that side by either obturating flow or potentiating in-situ thrombosis.  I would remove the Right femoral sheath as soon as possible.  Neuro ICU staff are reluctant to proceed given the relatively proximity to tPA administration.  I discussed with the patient's family, that in generally, thrombectomies and bypasses involve some significant amount of blood loss.    In the setting of recent thrombolysis, this blood loss might be significant enough to impact blood pressure.  In the event of significant hypotension, hypoperfusion of the brain can be expected along with extension of the infarct.  I discussed with the family the consequences of long-term ischemia in a leg and the possibility of limb loss.  They are aware of such and elect to hold on immediate intervention on the right leg.  In the event, the patient wakes up and has pain in the right leg or more frank ischemic changes in the right leg develop, would consider Right leg angiogram, possible intervention depending on the findings.  Thank you for allowing [place] to participate in this patient's care.   Adele Barthel, MD, FACS [facility] [place] Office: [phone] Pager: [phone]  11/18/2017, 6:58 PM  Addendum  Discussed cased with Dr. Estanislado Pandy.  Due to IV tPA administration, he also recommends holding on sheath removal.  Adele Barthel, MD, [facility] [place] Office: [phone] Pager: [phone]  11/18/2017, 7:49 PM

## 2017-11-19 ENCOUNTER — Inpatient Hospital Stay (HOSPITAL_COMMUNITY): Payer: 59

## 2017-11-19 ENCOUNTER — Encounter (HOSPITAL_COMMUNITY): Payer: Self-pay | Admitting: Interventional Radiology

## 2017-11-19 LAB — HEMOGLOBIN A1C
Hgb A1c MFr Bld: 9.1 % — ABNORMAL HIGH (ref 4.8–5.6)
Mean Plasma Glucose: 214.47 mg/dL

## 2017-11-19 LAB — BASIC METABOLIC PANEL
Anion gap: 8 (ref 5–15)
BUN: 11 mg/dL (ref 8–23)
CALCIUM: 8.5 mg/dL — AB (ref 8.9–10.3)
CHLORIDE: 107 mmol/L (ref 98–111)
CO2: 22 mmol/L (ref 22–32)
CREATININE: 0.54 mg/dL — AB (ref 0.61–1.24)
GFR calc Af Amer: >60 mL/min (ref >60)
GFR calc non Af Amer: >60 mL/min (ref >60)
Glucose, Bld: 176 mg/dL — ABNORMAL HIGH (ref 70–99)
Potassium: 3.6 mmol/L (ref 3.5–5.1)
SODIUM: 137 mmol/L (ref 135–145)

## 2017-11-19 LAB — GLUCOSE, CAPILLARY
GLUCOSE-CAPILLARY: 220 mg/dL — AB (ref 70–99)
GLUCOSE-CAPILLARY: 169 mg/dL — AB (ref 70–99)
GLUCOSE-CAPILLARY: 153 mg/dL — AB (ref 70–99)
GLUCOSE-CAPILLARY: 157 mg/dL — AB (ref 70–99)
GLUCOSE-CAPILLARY: 158 mg/dL — AB (ref 70–99)
GLUCOSE-CAPILLARY: 127 mg/dL — AB (ref 70–99)
GLUCOSE-CAPILLARY: 143 mg/dL — AB (ref 70–99)
GLUCOSE-CAPILLARY: 146 mg/dL — AB (ref 70–99)
Glucose-Capillary: 173 mg/dL — ABNORMAL HIGH (ref 70–99)
Glucose-Capillary: 208 mg/dL — ABNORMAL HIGH (ref 70–99)
Glucose-Capillary: 175 mg/dL — ABNORMAL HIGH (ref 70–99)
Glucose-Capillary: 143 mg/dL — ABNORMAL HIGH (ref 70–99)
Glucose-Capillary: 158 mg/dL — ABNORMAL HIGH (ref 70–99)
Glucose-Capillary: 147 mg/dL — ABNORMAL HIGH (ref 70–99)
Glucose-Capillary: 156 mg/dL — ABNORMAL HIGH (ref 70–99)
Glucose-Capillary: 147 mg/dL — ABNORMAL HIGH (ref 70–99)
Glucose-Capillary: 125 mg/dL — ABNORMAL HIGH (ref 70–99)
Glucose-Capillary: 178 mg/dL — ABNORMAL HIGH (ref 70–99)
Glucose-Capillary: 163 mg/dL — ABNORMAL HIGH (ref 70–99)
Glucose-Capillary: 160 mg/dL — ABNORMAL HIGH (ref 70–99)

## 2017-11-19 LAB — POCT I-STAT 4, (NA,K, GLUC, HGB,HCT)
GLUCOSE: 258 mg/dL — AB (ref 70–99)
Glucose, Bld: 391 mg/dL — ABNORMAL HIGH (ref 70–99)
Glucose, Bld: 302 mg/dL — ABNORMAL HIGH (ref 70–99)
HCT: 36 % — ABNORMAL LOW (ref 39.0–52.0)
HCT: 32 % — ABNORMAL LOW (ref 39.0–52.0)
HEMATOCRIT: 31 % — AB (ref 39.0–52.0)
HEMOGLOBIN: 10.5 g/dL — AB (ref 13.0–17.0)
Hemoglobin: 12.2 g/dL — ABNORMAL LOW (ref 13.0–17.0)
Hemoglobin: 10.9 g/dL — ABNORMAL LOW (ref 13.0–17.0)
POTASSIUM: 4.1 mmol/L (ref 3.5–5.1)
POTASSIUM: 4 mmol/L (ref 3.5–5.1)
Potassium: 4.7 mmol/L (ref 3.5–5.1)
SODIUM: 138 mmol/L (ref 135–145)
Sodium: 134 mmol/L — ABNORMAL LOW (ref 135–145)
Sodium: 136 mmol/L (ref 135–145)

## 2017-11-19 LAB — CBC WITH DIFFERENTIAL/PLATELET
Abs Immature Granulocytes: 0.1 10*3/uL (ref 0.0–0.1)
BASOS PCT: 0 %
Basophils Absolute: 0 10*3/uL (ref 0.0–0.1)
EOS ABS: 0.2 10*3/uL (ref 0.0–0.7)
Eosinophils Relative: 1 %
HCT: 35.2 % — ABNORMAL LOW (ref 39.0–52.0)
Hemoglobin: 11.2 g/dL — ABNORMAL LOW (ref 13.0–17.0)
IMMATURE GRANULOCYTES: 1 %
Lymphocytes Relative: 9 %
Lymphs Abs: 1.2 10*3/uL (ref 0.7–4.0)
MCH: 28.5 pg (ref 26.0–34.0)
MCHC: 31.8 g/dL (ref 30.0–36.0)
MCV: 89.6 fL (ref 78.0–100.0)
Monocytes Absolute: 0.7 10*3/uL (ref 0.1–1.0)
Monocytes Relative: 6 %
NEUTROS PCT: 83 %
Neutro Abs: 10.9 10*3/uL — ABNORMAL HIGH (ref 1.7–7.7)
PLATELETS: 273 10*3/uL (ref 150–400)
RBC: 3.93 MIL/uL — AB (ref 4.22–5.81)
RDW: 14 % (ref 11.5–15.5)
WBC: 13.1 10*3/uL — AB (ref 4.0–10.5)

## 2017-11-19 LAB — BLOOD GAS, ARTERIAL
ACID-BASE DEFICIT: 0.7 mmol/L (ref 0.0–2.0)
Bicarbonate: 23.4 mmol/L (ref 20.0–28.0)
DRAWN BY: 51133
FIO2: 50
MECHVT: 440 mL
O2 Saturation: 98.9 %
PEEP: 10 cmH2O
PH ART: 7.401 (ref 7.350–7.450)
Patient temperature: 99.1
RATE: 26 resp/min
pCO2 arterial: 38.6 mmHg (ref 32.0–48.0)
pO2, Arterial: 124 mmHg — ABNORMAL HIGH (ref 83.0–108.0)

## 2017-11-19 LAB — LIPID PANEL
CHOL/HDL RATIO: 5.8 ratio
Cholesterol: 110 mg/dL (ref 0–200)
HDL: 19 mg/dL — AB (ref >40)
LDL CALC: 23 mg/dL (ref 0–99)
Triglycerides: 340 mg/dL — ABNORMAL HIGH (ref <150)
VLDL: 68 mg/dL — AB (ref 0–40)

## 2017-11-19 LAB — POCT I-STAT 7, (LYTES, BLD GAS, ICA,H+H)
ACID-BASE DEFICIT: 2 mmol/L (ref 0.0–2.0)
Bicarbonate: 25.6 mmol/L (ref 20.0–28.0)
CALCIUM ION: 1.25 mmol/L (ref 1.15–1.40)
HEMATOCRIT: 36 % — AB (ref 39.0–52.0)
HEMOGLOBIN: 12.2 g/dL — AB (ref 13.0–17.0)
O2 Saturation: 100 %
POTASSIUM: 4.7 mmol/L (ref 3.5–5.1)
SODIUM: 135 mmol/L (ref 135–145)
TCO2: 27 mmol/L (ref 22–32)
pCO2 arterial: 53.1 mmHg — ABNORMAL HIGH (ref 32.0–48.0)
pH, Arterial: 7.291 — ABNORMAL LOW (ref 7.350–7.450)
pO2, Arterial: 378 mmHg — ABNORMAL HIGH (ref 83.0–108.0)

## 2017-11-19 LAB — MAGNESIUM: MAGNESIUM: 1.6 mg/dL — AB (ref 1.7–2.4)

## 2017-11-19 LAB — PHOSPHORUS: Phosphorus: 2.9 mg/dL (ref 2.5–4.6)

## 2017-11-19 LAB — HIV ANTIBODY (ROUTINE TESTING W REFLEX): HIV Screen 4th Generation wRfx: NONREACTIVE

## 2017-11-19 MED ORDER — ROSUVASTATIN CALCIUM 10 MG PO TABS
10.0000 mg | ORAL_TABLET | Freq: Every day | ORAL | Status: DC
  Administered 2017-11-19 – 2017-11-23 (×5): 10 mg
  Filled 2017-11-19 (×6): qty 1

## 2017-11-19 MED ORDER — PANTOPRAZOLE SODIUM 40 MG PO PACK
40.0000 mg | PACK | Freq: Every day | ORAL | Status: DC
  Administered 2017-11-19 – 2017-12-15 (×25): 40 mg
  Filled 2017-11-19 (×28): qty 20

## 2017-11-19 MED ORDER — MAGNESIUM SULFATE 2 GM/50ML IV SOLN
2.0000 g | Freq: Once | INTRAVENOUS | Status: AC
  Administered 2017-11-19: 2 g via INTRAVENOUS
  Filled 2017-11-19: qty 50

## 2017-11-19 MED ORDER — DEXTROSE 5 % IV SOLN
INTRAVENOUS | Status: DC
  Administered 2017-11-19: 16:00:00 via INTRAVENOUS

## 2017-11-19 NOTE — Plan of Care (Signed)
Sheath removed, right groin site level 0. Doppler DP pulses.

## 2017-11-19 NOTE — Progress Notes (Addendum)
I evaluated the patient this am and spoke with the wife and son.  He still has a sheath in the right groin.  I was able to get a faint monophasic right DP signal.  The right foot is cooler than the left, but it has normal color.  Patient remains intubated and so motor and sensory exam can not be performed.  Currently there is no evidence that the leg is ischemic.  He will need serial evaluations today.  Sheath to be removed as soon as possible.  Annamarie Major

## 2017-11-19 NOTE — Progress Notes (Signed)
Neuro IR Rt 8 french femoral sheath removed at 1215.  VPAD and manual pressure applied to achieve hemostasis at 1240.  Pulses dopplered.  No acute  complications.  Site reviewed with Comptroller and pressure dressing applied. Cory Sandling RT-R [employer] RT-R

## 2017-11-19 NOTE — Progress Notes (Signed)
SLP Cancellation Note  Patient Details Name: [name] [name] MRN: [id number] DOB: 07/14/1955   Cancelled treatment:       Reason Eval/Treat Not Completed: Patient not medically ready. Remains intubated   Burton Gahan, Katherene Ponto 11/19/2017, 9:07 AM

## 2017-11-19 NOTE — Progress Notes (Signed)
Patient transported to CT and back to room 4Q19 without complications.

## 2017-11-19 NOTE — Progress Notes (Signed)
RT was called to bedside due to Aline not pulling back or flushing. Dressing was changed and the catheter was found kinked and half way out. A-line is now able to pull back and flush.

## 2017-11-19 NOTE — Progress Notes (Signed)
Referring Physician(s): CODE STROKE  Supervising Physician: Luanne Bras  Patient Status:  [facility] - In-pt  Chief Complaint: None.  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 Estanislado Pandy. 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 Estanislado Pandy. Patient intubated, RN reports sedation recently turned off. History difficult to obtain due to intubation. Accompanied by family member at bedside. Opens eyes to voice. Spontaneous movement of right hand seen x1, no spontaneous movement of other extremities, grip strength noted with right hand. Positive Babinski reflex in left foot. Left DP palpable with doppler, right DP not palpable. Right groin incision c/d/i with sheath in place.  Allergies: Patient has no known allergies.  Medications: Prior to Admission medications   Medication Sig Start Date End Date Taking? Authorizing Provider  fenofibrate 160 MG tablet Take 1 tablet (160 mg total) by mouth daily. 06/14/17  Yes Revankar, Reita Cliche, MD  irbesartan (AVAPRO) 75 MG tablet TAKE ONE TABLET BY MOUTH ONE TIME DAILY  05/31/17  Yes Tanda Rockers, MD  metFORMIN (GLUCOPHAGE) 1000 MG tablet Take 1,000 mg by mouth 2 (two) times daily with a meal.   Yes [provider]  metoprolol tartrate (LOPRESSOR) 50 MG tablet Take 1 tablet (50 mg total) by mouth 2 (two) times daily. 11/01/17 04/30/18 Yes Revankar, Reita Cliche, MD  nitroGLYCERIN (NITROSTAT) 0.4 MG SL tablet Place 1 tablet (0.4 mg total) under the tongue every 5 (five) minutes as needed for chest pain. 08/13/17 11/18/17 Yes Revankar, Reita Cliche, MD  apixaban (ELIQUIS) 5 MG TABS tablet Take 1 tablet (5 mg total) 2 (two) times daily by mouth. 04/05/17   Revankar, Reita Cliche, MD  B Complex Vitamins (VITAMIN B COMPLEX PO) Take 1 capsule at bedtime by mouth.     [provider]  clopidogrel (PLAVIX) 75 MG tablet Take 1 tablet (75 mg total) by mouth daily. 09/11/16    Maryellen Pile, MD  dronedarone (MULTAQ) 400 MG tablet Take 1 tablet (400 mg total) 2 (two) times daily with a meal by mouth. 04/05/17   Revankar, Reita Cliche, MD  famotidine (PEPCID) 20 MG tablet One at bedtime Patient not taking: Reported on 11/18/2017 01/15/17   Tanda Rockers, MD  liraglutide (VICTOZA) 18 MG/3ML SOPN Inject 0.2 mLs (1.2 mg total) into the skin daily at 10 pm. Start with 0.6 mg for one week, then increase to 1.2 mg daily. Patient not taking: Reported on 11/18/2017 06/25/17   Lorella Nimrod, MD  Omega-3 Fatty Acids (FISH OIL) 1000 MG CAPS Take 2 capsules (2,000 mg total) by mouth 2 (two) times daily. Patient not taking: Reported on 11/18/2017 08/23/17   Revankar, Reita Cliche, MD  pantoprazole (PROTONIX) 40 MG tablet TAKE 1 TABLET BY MOUTH ONCE A DAY 30 TO 60 MINUTES BEFORE FIRST MEAL OF THE DAY  Patient not taking: Reported on 11/18/2017 07/09/17   Tanda Rockers, MD  rosuvastatin (CRESTOR) 10 MG tablet Take 5 mg by mouth at bedtime.    [provider]     Vital Signs: BP 140/67   Pulse (!) 110   Temp 99.8 F (37.7 C) (Axillary)   Resp (!) 26   Ht 5\' 10"  (1.778 m)   Wt (!) 312 lb 2.7 oz (141.6 kg)   SpO2 95%   BMI 44.79 kg/m   Physical Exam  Constitutional: He appears well-developed and well-nourished. No distress.  Cardiovascular: Normal rate, regular rhythm and normal heart sounds.  No murmur heard. Pulmonary/Chest: Effort normal and breath  sounds normal. No respiratory distress. He has no wheezes.  Neurological:  Intubated without sedation, eyes open to voice. PERRL bilaterally. EOMs intact with left gaze preference noted. Visual fields not assessed. No facial asymmetry. Spontaneous movement of right hand seen x1, no spontaneous movement of other extremities, grip strength noted with right hand. Positive babinski in left foot. Pronator drift not assessed. Fine motor and coordination not assessed. Gait not assessed. Romberg not assessed. Heel to toe not  assessed. Left DP palpable with doppler, right DP not palpable.  Skin: Skin is warm and dry.  Right groin incision soft with sheath in place, no active bleeding or hematoma.  Psychiatric:  Intubated.  Nursing note and vitals reviewed.   Imaging: Ct Angio Head W Or Wo Contrast  Result Date: 11/18/2017 CLINICAL DATA:  Clinical deterioration. Concern regarding basilar thrombosis. EXAM: CT ANGIOGRAPHY HEAD AND NECK TECHNIQUE: Multidetector CT imaging of the head and neck was performed using the standard protocol during bolus administration of intravenous contrast. Multiplanar CT image reconstructions and MIPs were obtained to evaluate the vascular anatomy. Carotid stenosis measurements (when applicable) are obtained utilizing NASCET criteria, using the distal internal carotid diameter as the denominator. CONTRAST:  164mL ISOVUE-370 IOPAMIDOL (ISOVUE-370) INJECTION 76% COMPARISON:  Earlier same day. FINDINGS: CT HEAD FINDINGS Brain: No change when compared to the previous head CT. Generalized atrophy. Chronic small-vessel changes of the pons and hemispheric white matter. No sign of acute infarction by CT. No hemorrhage. No hydrocephalus or extra-axial collection. Vascular: Extensive atherosclerotic calcification of the major vessels at the base the brain. Skull: Negative Sinuses: Clear Orbits: Negative Review of the MIP images confirms the above findings CTA NECK FINDINGS Aortic arch: Atherosclerotic change without aneurysm or dissection. Right carotid system: Common carotid artery widely patent to the bifurcation region. Advanced calcified and soft plaque at the carotid bifurcation and proximal ICA. Than what diameter of the proximal ICA is 1 mm or less, consistent with 90% or greater stenosis. Beyond that, vessel shows reduced caliber but does show antegrade flow. Left carotid system: Common carotid artery shows some atherosclerotic plaque but no stenosis. At the left carotid bifurcation, there is soft and  calcified plaque. At the distal bulb, minimal diameter is 3.5 mm. Compared to a more distal cervical ICA diameter of 5 mm, this indicates a 30% stenosis. Cervical ICA widely patent beyond that. Vertebral arteries: Vertebral artery origins are patent. Vertebral arteries are fairly small vessels but are patent through the cervical region to foramen magnum. Skeleton: No significant bone finding. 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 See prior report. Upper chest: New consolidation in the right upper lobe. Possible small pulmonary nodules. Superior mediastinal adenopathy. See prior report. Review of the MIP images confirms the above findings CTA HEAD FINDINGS Anterior circulation: Both internal carotid arteries are patent through the siphon regions with extensive atherosclerotic calcification. Stenosis in the siphon regions is estimated at 50-70% on each side. Supraclinoid internal carotid arteries are patent. Flow is seen in both anterior and middle cerebral vessels without discernible missing branches. Posterior circulation: Severe calcified atherosclerotic disease of the V4 segments on each side with severe stenoses. Accurate measurement not possible, but stenosis is quite likely approaching 90% on each side. There is a proximal basilar stenosis estimated at 70%. Since the study of earlier today, there is occlusion of the distal basilar artery proximal to the superior cerebellar and the posterior cerebral arteries. These  vessels do show flow, presumably indicating patent posterior communicating vessels. Venous sinuses: Patent and normal. Anatomic variants: None other significant. Delayed phase: No abnormal enhancement. Review of the MIP images confirms the above findings IMPRESSION: Since the study of earlier today, there are only 2 changes. There is new consolidation and volume loss in the right upper lobe. There is new  distal basilar segmental occlusion. Flow is seen distal to that however, presumably indicating patent posterior communicating arteries. The study was discussed at the time of performance with Dr. Leonel Ramsay. Electronically Signed   By: Nelson Chimes M.D.   On: 11/18/2017 13:41   Ct Angio Head W Or Wo Contrast  Result Date: 11/18/2017 CLINICAL DATA:  Stroke.  Suspect cerebral hemorrhage EXAM: CT ANGIOGRAPHY HEAD AND NECK TECHNIQUE: Multidetector CT imaging of the head and neck was performed using the standard protocol during bolus administration of intravenous contrast. Multiplanar CT image reconstructions and MIPs were obtained to evaluate the vascular anatomy. Carotid stenosis measurements (when applicable) are obtained utilizing NASCET criteria, using the distal internal carotid diameter as the denominator. CONTRAST:  33mL ISOVUE-370 IOPAMIDOL (ISOVUE-370) INJECTION 76% COMPARISON:  CT head 11/18/2017 FINDINGS: CTA NECK FINDINGS Aortic arch: Atherosclerotic calcification aortic arch without aneurysm or dissection. Right carotid system: Atherosclerotic calcification right carotid bifurcation and proximal internal carotid artery. Severe stenosis proximal right internal carotid artery. Accurate measurement difficult due to heavily calcified vessel but this appears to be greater than 90% diameter stenosis. Left carotid system: Mild atherosclerotic disease left carotid bifurcation without significant carotid stenosis. Vertebral arteries: Both vertebral arteries widely patent in the neck without significant stenosis. Severe stenosis distal vertebral artery bilaterally due to heavily calcified plaque. Skeleton: Negative Other neck: Left thyroid mass measures 2.5 x 4 cm with heterogeneous enhancement and coarse calcification, highly suspicious for carcinoma. Partially calcified 10 mm lymph node in the left neck level 3 node possibly metastatic node. Partially calcified left level 3 node 9 mm axial image 111 also  suspicious for metastatic disease. Upper chest: 6.6 mm right upper lobe nodule. 5.5 mm right upper lobe nodule posteriorly. Left lung apex clear. Review of the MIP images confirms the above findings CTA HEAD FINDINGS Anterior circulation: Extensive atherosclerotic calcification in the cavernous carotid bilaterally. There appears to be severe stenosis in the right cavernous carotid and moderate stenosis on the left. Suboptimal opacification of the intracranial arteries due to timing of the injection. Diffuse atherosclerotic disease with mild diffuse narrowing of the left middle cerebral artery. Moderate stenosis left middle cerebral artery branch supplying the temporal lobe. Right middle cerebral artery branches show mild disease. Anterior cerebral arteries patent bilaterally with diffuse disease. Posterior circulation: Heavily calcified plaque in severe stenosis distal vertebral artery bilaterally. Basilar artery diffusely diseased with mild stenosis. Posterior cerebral arteries patent bilaterally without significant stenosis. Venous sinuses: Patent Anatomic variants: None Delayed phase: Not performed Review of the MIP images confirms the above findings IMPRESSION: 1. Severe intracranial and extracranial atherosclerotic disease. No emergent large vessel occlusion 2. Severe stenosis right carotid bifurcation due to calcified plaque, estimated 90% diameter stenosis. No significant left carotid stenosis. Severe stenosis cavernous carotid bilaterally. 3. Both vertebral arteries are patent in the neck with severe calcific stenosis V4 segment bilaterally. 4. 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 Small calcified lymph nodes left neck suspicious for metastatic disease 5. Small lung nodules on the right, possible metastatic disease. Recommend chest CT for further evaluation. 6. These results were called by telephone at the time of  interpretation on 11/18/2017 at 10:00 am to Dr. Leonel Ramsay , who  verbally acknowledged these results. Electronically Signed   By: Franchot Gallo M.D.   On: 11/18/2017 10:10   Ct Angio Neck W Or Wo Contrast  Result Date: 11/18/2017 CLINICAL DATA:  Clinical deterioration. Concern regarding basilar thrombosis. EXAM: CT ANGIOGRAPHY HEAD AND NECK TECHNIQUE: Multidetector CT imaging of the head and neck was performed using the standard protocol during bolus administration of intravenous contrast. Multiplanar CT image reconstructions and MIPs were obtained to evaluate the vascular anatomy. Carotid stenosis measurements (when applicable) are obtained utilizing NASCET criteria, using the distal internal carotid diameter as the denominator. CONTRAST:  152mL ISOVUE-370 IOPAMIDOL (ISOVUE-370) INJECTION 76% COMPARISON:  Earlier same day. FINDINGS: CT HEAD FINDINGS Brain: No change when compared to the previous head CT. Generalized atrophy. Chronic small-vessel changes of the pons and hemispheric white matter. No sign of acute infarction by CT. No hemorrhage. No hydrocephalus or extra-axial collection. Vascular: Extensive atherosclerotic calcification of the major vessels at the base the brain. Skull: Negative Sinuses: Clear Orbits: Negative Review of the MIP images confirms the above findings CTA NECK FINDINGS Aortic arch: Atherosclerotic change without aneurysm or dissection. Right carotid system: Common carotid artery widely patent to the bifurcation region. Advanced calcified and soft plaque at the carotid bifurcation and proximal ICA. Than what diameter of the proximal ICA is 1 mm or less, consistent with 90% or greater stenosis. Beyond that, vessel shows reduced caliber but does show antegrade flow. Left carotid system: Common carotid artery shows some atherosclerotic plaque but no stenosis. At the left carotid bifurcation, there is soft and calcified plaque. At the distal bulb, minimal diameter is 3.5 mm. Compared to a more distal cervical ICA diameter of 5 mm, this indicates a 30%  stenosis. Cervical ICA widely patent beyond that. Vertebral arteries: Vertebral artery origins are patent. Vertebral arteries are fairly small vessels but are patent through the cervical region to foramen magnum. Skeleton: No significant bone finding. 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 See prior report. Upper chest: New consolidation in the right upper lobe. Possible small pulmonary nodules. Superior mediastinal adenopathy. See prior report. Review of the MIP images confirms the above findings CTA HEAD FINDINGS Anterior circulation: Both internal carotid arteries are patent through the siphon regions with extensive atherosclerotic calcification. Stenosis in the siphon regions is estimated at 50-70% on each side. Supraclinoid internal carotid arteries are patent. Flow is seen in both anterior and middle cerebral vessels without discernible missing branches. Posterior circulation: Severe calcified atherosclerotic disease of the V4 segments on each side with severe stenoses. Accurate measurement not possible, but stenosis is quite likely approaching 90% on each side. There is a proximal basilar stenosis estimated at 70%. Since the study of earlier today, there is occlusion of the distal basilar artery proximal to the superior cerebellar and the posterior cerebral arteries. These vessels do show flow, presumably indicating patent posterior communicating vessels. Venous sinuses: Patent and normal. Anatomic variants: None other significant. Delayed phase: No abnormal enhancement. Review of the MIP images confirms the above findings IMPRESSION: Since the study of earlier today, there are only 2 changes. There is new consolidation and volume loss in the right upper lobe. 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 Flow is seen distal to that however, presumably indicating patent posterior communicating arteries. The study  was discussed at the time of performance with Dr. Leonel Ramsay. Electronically  Signed   By: Nelson Chimes M.D.   On: 11/18/2017 13:41   Ct Angio Neck W Or Wo Contrast  Result Date: 11/18/2017 CLINICAL DATA:  Stroke.  Suspect cerebral hemorrhage EXAM: CT ANGIOGRAPHY HEAD AND NECK TECHNIQUE: Multidetector CT imaging of the head and neck was performed using the standard protocol during bolus administration of intravenous contrast. Multiplanar CT image reconstructions and MIPs were obtained to evaluate the vascular anatomy. Carotid stenosis measurements (when applicable) are obtained utilizing NASCET criteria, using the distal internal carotid diameter as the denominator. CONTRAST:  41mL ISOVUE-370 IOPAMIDOL (ISOVUE-370) INJECTION 76% COMPARISON:  CT head 11/18/2017 FINDINGS: CTA NECK FINDINGS Aortic arch: Atherosclerotic calcification aortic arch without aneurysm or dissection. Right carotid system: Atherosclerotic calcification right carotid bifurcation and proximal internal carotid artery. Severe stenosis proximal right internal carotid artery. Accurate measurement difficult due to heavily calcified vessel but this appears to be greater than 90% diameter stenosis. Left carotid system: Mild atherosclerotic disease left carotid bifurcation without significant carotid stenosis. Vertebral arteries: Both vertebral arteries widely patent in the neck without significant stenosis. Severe stenosis distal vertebral artery bilaterally due to heavily calcified plaque. Skeleton: Negative Other neck: Left thyroid mass measures 2.5 x 4 cm with heterogeneous enhancement and coarse calcification, highly suspicious for carcinoma. Partially calcified 10 mm lymph node in the left neck level 3 node possibly metastatic node. Partially calcified left level 3 node 9 mm axial image 111 also suspicious for metastatic disease. Upper chest: 6.6 mm right upper lobe nodule. 5.5 mm right upper lobe nodule posteriorly. Left lung apex clear.  Review of the MIP images confirms the above findings CTA HEAD FINDINGS Anterior circulation: Extensive atherosclerotic calcification in the cavernous carotid bilaterally. There appears to be severe stenosis in the right cavernous carotid and moderate stenosis on the left. Suboptimal opacification of the intracranial arteries due to timing of the injection. Diffuse atherosclerotic disease with mild diffuse narrowing of the left middle cerebral artery. Moderate stenosis left middle cerebral artery branch supplying the temporal lobe. Right middle cerebral artery branches show mild disease. Anterior cerebral arteries patent bilaterally with diffuse disease. Posterior circulation: Heavily calcified plaque in severe stenosis distal vertebral artery bilaterally. Basilar artery diffusely diseased with mild stenosis. Posterior cerebral arteries patent bilaterally without significant stenosis. Venous sinuses: Patent Anatomic variants: None Delayed phase: Not performed Review of the MIP images confirms the above findings IMPRESSION: 1. Severe intracranial and extracranial atherosclerotic disease. No emergent large vessel occlusion 2. Severe stenosis right carotid bifurcation due to calcified plaque, estimated 90% diameter stenosis. No significant left carotid stenosis. Severe stenosis cavernous carotid bilaterally. 3. Both vertebral arteries are patent in the neck with severe calcific stenosis V4 segment bilaterally. 4. 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 Small calcified lymph nodes left neck suspicious for metastatic disease 5. Small lung nodules on the right, possible metastatic disease. Recommend chest CT for further evaluation. 6. These results were called by telephone at the time of interpretation on 11/18/2017 at 10:00 am to Dr. Leonel Ramsay , who verbally acknowledged these results. Electronically Signed   By: Franchot Gallo M.D.   On: 11/18/2017 10:10   Ct Chest W Contrast  Result Date:  11/18/2017 CLINICAL DATA:  Unable to follow commands. EXAM: CT CHEST WITH CONTRAST TECHNIQUE: Multidetector CT imaging of the chest was performed during intravenous contrast administration. CONTRAST:  137mL ISOVUE-370 IOPAMIDOL (ISOVUE-370) INJECTION 76% COMPARISON:  Radiograph of same day.  CT scan of August 23, 2016. FINDINGS: Cardiovascular: Atherosclerosis of thoracic aorta  is noted without aneurysm formation. Coronary artery calcifications are noted. No pericardial effusion is noted. Mild cardiomegaly is noted. Mediastinum/Nodes: Endotracheal and nasogastric tubes are in grossly good position. Stable 1 cm lymph node seen in aortopulmonary window. Stable 1.3 cm right paratracheal lymph node. Stable 1 cm right subcarinal lymph node. Bilateral calcified thyroid nodules are again noted. The largest measures 2.8 cm on the left. Lungs/Pleura: No pneumothorax or significant pleural effusion is noted. Right upper lobe airspace opacity with air bronchograms is noted concerning for pneumonia or atelectasis. Minimal bibasilar subsegmental atelectasis is noted. Stable 8 mm nodule seen in lingular segment of left upper lobe. Upper Abdomen: No acute abnormality. Musculoskeletal: No chest wall abnormality. No acute or significant osseous findings. IMPRESSION: Right upper lobe airspace opacity is noted with air bronchograms concerning for pneumonia or possibly atelectasis. Stable mediastinal adenopathy is noted compared to prior exam. Endotracheal and nasogastric tubes are in grossly good position. Coronary artery calcifications are noted suggesting coronary artery disease. Possible 2.8 cm left thyroid nodule. Thyroid ultrasound is recommended for further evaluation. Aortic Atherosclerosis (ICD10-I70.0). Electronically Signed   By: Marijo Conception, M.D.   On: 11/18/2017 13:47   Mr Brain Wo Contrast  Result Date: 11/19/2017 CLINICAL DATA:  62 y/o M; findings of stroke with progression of symptoms post basilar intervention.  EXAM: MRI HEAD WITHOUT CONTRAST TECHNIQUE: Multiplanar, multiecho pulse sequences of the brain and surrounding structures were obtained without intravenous contrast. COMPARISON:  11/18/2017 CTA and CT of the head. 11/18/2017 cerebral angiography. FINDINGS: Brain: Reduced diffusion compatible with acute/early subacute infarction within the central pons measuring 2.4 x 2.6 x 1.1 cm (volume = 3.6 cm^3)(AP x ML x CC series 5, image 58 and series 7, image 56). The region of reduced diffusion is involves both sides of the pons, greater on the right. There additional punctate foci of infarction within the left cerebellar hemisphere (series 5, image 53) and the right parietal cortex (series 5, image 77). No associated hemorrhage or significant mass effect. Minimal chronic microvascular ischemic changes of the brain and mild brain parenchymal volume loss. Single punctate focus of susceptibility hypointensity within the right cerebellar hemisphere compatible with hemosiderin deposition of chronic microhemorrhage. No extra-axial collection, hydrocephalus, or herniation. Vascular: Normal flow voids. Skull and upper cervical spine: Normal marrow signal. Sinuses/Orbits: Diffuse paranasal sinus mucosal thickening and fluid levels likely due to intubation. Orbits are unremarkable. Other: None. IMPRESSION: 1. 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 No associated hemorrhage or significant mass effect. 2. Additional punctate acute/early subacute infarctions within left lateral cerebellum and right parietal cortex. 3. Minimal chronic microvascular ischemic changes of the brain and mild parenchymal volume loss. These results will be called to the ordering clinician or representative by the Radiologist Assistant, and communication documented in the PACS or zVision Dashboard. Electronically Signed   By: Kristine Garbe M.D.   On:  11/19/2017 01:37   Dg Chest Port 1 View  Result Date: 11/19/2017 CLINICAL DATA:  Evaluate endotracheal tube.  Stroke. EXAM: PORTABLE CHEST 1 VIEW COMPARISON:  Chest radiograph 11/18/2017 FINDINGS: Endotracheal tube is at the level of the clavicular heads and approximately 6.5 cm above the carina. Streaky densities in the lower chest most likely associated with atelectasis. Overall, slightly improved aeration in the lungs. Heart size is upper limits of normal. Nasogastric tube extends into the abdomen. Negative for a pneumothorax. IMPRESSION: Stable appearance of the endotracheal tube. Nasogastric tube  extends into the abdomen. Slightly improved aeration in lungs may represent decreasing atelectasis or edema. Electronically Signed   By: Markus Daft M.D.   On: 11/19/2017 08:59   Dg Chest Port 1 View  Result Date: 11/18/2017 CLINICAL DATA:  Stroke symptoms. EXAM: PORTABLE CHEST 1 VIEW COMPARISON:  01/15/2017. FINDINGS: Endotracheal tube noted with tip 5 cm above the carina. NG tube noted with tip below left hemidiaphragm. Cardiomegaly with pulmonary vascular congestion and bilateral interstitial prominence suggesting CHF. No pleural effusion or pneumothorax. IMPRESSION: 1. Endotracheal tube tip noted 5 cm above the carina. NG tube tip noted below left hemidiaphragm. 2. Cardiomegaly with pulmonary venous congestion bilateral interstitial prominence suggesting mild CHF. Electronically Signed   By: Marcello Moores  Register   On: 11/18/2017 11:20   Ct Head Code Stroke Wo Contrast  Result Date: 11/18/2017 CLINICAL DATA:  Code stroke.  Left-sided deficit, slurred speech EXAM: CT HEAD WITHOUT CONTRAST TECHNIQUE: Contiguous axial images were obtained from the base of the skull through the vertex without intravenous contrast. COMPARISON:  None. FINDINGS: Brain: No evidence of acute infarction, hemorrhage, hydrocephalus, extra-axial collection or mass lesion/mass effect. Vascular: Negative for hyperdense vessel. Calcification  in distal vertebral and carotid arteries is advanced bilaterally Skull: Negative Sinuses/Orbits: Negative Other: None ASPECTS ([place] Stroke Program Early CT Score) - Ganglionic level infarction (caudate, lentiform nuclei, internal capsule, insula, M1-M3 cortex): 7 - Supraganglionic infarction (M4-M6 cortex): 3 Total score (0-10 with 10 being normal): 10 IMPRESSION: 1. No acute intracranial abnormality.  Atherosclerotic calcification 2. ASPECTS is 10 Electronically Signed   By: Franchot Gallo M.D.   On: 11/18/2017 09:54    Labs:  CBC: Recent Labs    04/06/17 0905 08/09/17 1000 11/18/17 0928 11/18/17 0933 11/18/17 1755 11/19/17 0437  WBC 10.6 7.9 9.3  --   --  13.1*  HGB 11.3* 12.8* 13.6 14.3 11.5* 11.2*  HCT 35.0* 38.4 42.5 42.0 36.2* 35.2*  PLT 312 276 276  --   --  273    COAGS: Recent Labs    03/26/17 1605 11/18/17 0928  INR 1.0 0.91  APTT 31 31    BMP: Recent Labs    04/06/17 0905 08/09/17 1000 11/18/17 0928 11/18/17 0933 11/19/17 0437  NA 136 138 134* 135 137  K 4.7 4.8 4.3 4.2 3.6  CL 98 96 99 100 107  CO2 23 26 23   --  22  GLUCOSE 188* 199* 377* 381* 176*  BUN 13 9 13 14 11   CALCIUM 10.0 10.0 9.8  --  8.5*  CREATININE 0.70* 0.65* 0.69 0.50* 0.54*  GFRNONAA 103 106 >60  --  >60  GFRAA 119 122 >60  --  >60    LIVER FUNCTION TESTS: Recent Labs    02/22/17 1242 04/06/17 0905 08/09/17 1000 11/18/17 0928  BILITOT 0.5 0.9 0.7 1.0  AST 16 15 20 24   ALT 23 18 25  34  ALKPHOS 68 73 50 58  PROT 7.0 7.0 7.1 7.2  ALBUMIN 4.0 3.9 3.9 3.5    Assessment and Plan:  Distal basilar artery stenosis s/p revascularization 11/18/2017 with Dr. Estanislado Pandy. 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 Estanislado Pandy. Patient's condition improving- spontaneous movement of right hand seen with grip of right hand noted but no movement of other extremities, positive Babinski reflex in left foot, DP of left palpable with doppler, DP of right  foot not palpable. Right groin incision stable with sheath in place- will remove sheath ASAP. Continue taking Brilinta 90 mg twice daily and  Aspirin 81 mg once daily. Appreciate and agree with neurology management. IR to follow.  Electronically Signed: Earley Abide, PA-C 11/19/2017, 9:57 AM   I spent a total of 15 Minutes at the the patient's bedside AND on the patient's hospital floor or unit, greater than 50% of which was counseling/coordinating care for distal basilar artery stenosis s/p revascularization AND left vertebrobasilar junction stenosis s/p revascularization.

## 2017-11-19 NOTE — Progress Notes (Signed)
OT Cancellation Note  Patient Details Name: Trusten Hume MRN: [id number] DOB: [birth year]   Cancelled Treatment:    Reason Eval/Treat Not Completed: Patient not medically ready (intubated/ bedrest)  Vonita Moss   OTR/L Pager: [phone] Office: [phone] .  11/19/2017, 9:25 AM

## 2017-11-19 NOTE — Progress Notes (Signed)
PT Cancellation Note  Patient Details Name: Jene Oravec MRN: [id number] DOB: [birth year]   Cancelled Treatment:    Reason Eval/Treat Not Completed: Patient not medically ready; patient remains on bedrest and intubated.  Will attempt another day.   Reginia Naas 11/19/2017, 8:38 AM  Magda Kiel, [place] 11/19/2017

## 2017-11-19 NOTE — Progress Notes (Signed)
Initial Nutrition Assessment  DOCUMENTATION CODES:   Morbid obesity  INTERVENTION:   Recommend initiate Vital High Protein @ 65 ml/hr (1560 ml/day) with 30 ml Prostat TID via OG tube  Provides: 1860 kcal, 181 grams protein, and 1304 ml free water.    NUTRITION DIAGNOSIS:   Inadequate oral intake related to inability to eat as evidenced by NPO status.  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 Pt noted to have a large thyroid mass and and multiple lung nodules suspicious for metastatic thyroid cancer. Also noted coolness of R distal leg, vascular has deferred interventions for now.    Pt discussed during ICU rounds and with RN.  Sheaths being pulled currently.   Patient is currently intubated on ventilator support  Medications reviewed and include:  2 grams mag sulfate x 1, insulin drip Labs reviewed: magnesium 1.6 (L) CBG (last 3)  Recent Labs    11/19/17 1148 11/19/17 1251 11/19/17 1348  GLUCAP 158* 147* 156*   OG tube in place  NUTRITION - FOCUSED PHYSICAL EXAM:  Complete at follow up  Diet Order:   Diet Order           Diet NPO time specified  Diet effective now          EDUCATION NEEDS:   No education needs have been identified at this time  Skin:  Skin Assessment: Reviewed RN Assessment  Last BM:  unknown  Height:   Ht Readings from Last 1 Encounters:  11/18/17 5\' 10"  (1.778 m)    Weight:   Wt Readings from Last 1 Encounters:  11/18/17 (!) 312 lb 2.7 oz (141.6 kg)    Ideal Body Weight:  75.4 kg  BMI:  Body mass index is 44.79 kg/m.  Estimated Nutritional Needs:   Kcal:  3646-8032  Protein:  151-188 grams  Fluid:  2 L/day  [name] [name]  RD, LDN, CNSC [phone] Pager [phone] After Hours Pager

## 2017-11-19 NOTE — Progress Notes (Signed)
Right groin sheath now removed.  There are improvement in the PT and DP doppler signals in the right foot.  He likely has PAD and the sheath caused decrease blood flow to the right foot.  The foot is now well perfused.  He can have his PAD followed up by Dr Bridgett Larsson as an outpatient.  No signsof compartment syndrome.  He is able to move his foot to command.  Will signoff.  Please call with any concerns.  [name] [name]

## 2017-11-19 NOTE — Progress Notes (Signed)
Inpatient Diabetes Program Recommendations  AACE/ADA: New Consensus Statement on Inpatient Glycemic Control (2015)  Target Ranges:  Prepandial:   less than 140 mg/dL      Peak postprandial:   less than 180 mg/dL (1-2 hours)      Critically ill patients:  140 - 180 mg/dL   Lab Results  Component Value Date   GLUCAP 147 (H) 11/19/2017   HGBA1C 9.1 (H) 11/19/2017    Review of Glycemic ControlResults for CABE, LASHLEY (MRN [id number]) as of 11/19/2017 15:31  Ref. Range 11/19/2017 10:44 11/19/2017 11:48 11/19/2017 12:51 11/19/2017 13:48 11/19/2017 15:22  Glucose-Capillary Latest Ref Range: 70 - 99 mg/dL 143 (H) 158 (H) 147 (H) 156 (H) 147 (H)    Diabetes history: Type 2 Outpatient Diabetes medications: Victoza 1.2 mg daily, Metformin 1000 mg bid Current orders for Inpatient glycemic control:  IV insulin- ICU glycemic control order set Inpatient Diabetes Program Recommendations:   Note insulin drip rates high. He currently does not meet criteria for transition off insulin drip.  Thanks,  Adah Perl, RN, BC-ADM Inpatient Diabetes Coordinator Pager [phone] (8a-5p)

## 2017-11-19 NOTE — Progress Notes (Addendum)
   Daily Progress Note  Both feet appear the same previously: viable without any frank ischemia.  Nursing reports transient dopplerable signal in right foot.  Interval MRI demonstrate B CVA.  Reportly pt spontaneous moving all extremities.   No peripheral intervention at this point  Pt is not a candidate for R CEA at this point.  Defer to Neuro IR if carotid stenting possible.   Adele Barthel, MD, FACS [facility] Office: [phone] Pager: [phone]  11/19/2017, 2:53 AM

## 2017-11-19 NOTE — Consult Note (Signed)
PULMONARY / CRITICAL CARE MEDICINE   Name: [name] [name] MRN: [id number] DOB: Apr 15, 1956    ADMISSION DATE:  11/18/2017 CONSULTATION DATE: 11/18/2017  REFERRING MD: Emergency department  CHIEF COMPLAINT: Stroke  HISTORY OF PRESENT ILLNESS:    [name] [name]  is a 62 year old male with a plethora of health issues who presented to [facility] on 11/18/2017 after contacting 911 when he noted slurred speech and right facial.  He was transported to [facility] emergency department code stroke was initiated.  He had been on Eliquis in the past for paroxysmal atrial fibrillation have been stopped 3 weeks prior due to hemoptysis. 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  There was concern that TPA could not be given due to recent hemoptysis and erosion of the thyroid nodule into the airway. He was urgently intubated by Dr. Nelda Marseille and erosion site was covered with the balloon cuff.  Patient was then given TPA and will be transferred to the neuro stroke unit. He will most likely remain intubated for period of 24 to 48 hours.  He will need further evaluation for most likely cancerous process in the neck and the lung.  6/28 The patient is on a ventialtor in the ICU. He had an intervention on the basilar artery and left vertebrobasilar jn. His vitals are stable. The apotient does have significant residual disease in the right proximal ICA. He developed coolness and pallor in the right distal leg. The foot is cool.He has been cseen by the [employer]. 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 The patient was apparently not aware of this finding preadmission.       PAST MEDICAL HISTORY :  He  has a past medical history of Collagen vascular disease ([place]), Coronary artery disease, GERD (gastroesophageal reflux disease), High  cholesterol, Hypertension, OSA (obstructive sleep apnea), Pneumonia (2011), and Type II diabetes mellitus ([place]) (07/2016).  PAST SURGICAL HISTORY: He  has a past surgical history that includes Video bronchoscopy (Bilateral, 08/25/2016); Lower Extremity Angiography (Bilateral, 04/01/2017); PERIPHERAL VASCULAR INTERVENTION (Left, 04/01/2017); and Coronary angioplasty with stent (2011; 07/2016).  No Known Allergies  No current facility-administered medications on file prior to encounter.    Current Outpatient Medications on File Prior to Encounter  Medication Sig  . fenofibrate 160 MG tablet Take 1 tablet (160 mg total) by mouth daily.  . irbesartan (AVAPRO) 75 MG tablet TAKE ONE TABLET BY MOUTH ONE TIME DAILY   . metFORMIN (GLUCOPHAGE) 1000 MG tablet Take 1,000 mg by mouth 2 (two) times daily with a meal.  . metoprolol tartrate (LOPRESSOR) 50 MG tablet Take 1 tablet (50 mg total) by mouth 2 (two) times daily.  . nitroGLYCERIN (NITROSTAT) 0.4 MG SL tablet Place 1 tablet (0.4 mg total) under the tongue every 5 (five) minutes as needed for chest pain.  Marland Kitchen apixaban (ELIQUIS) 5 MG TABS tablet Take 1 tablet (5 mg total) 2 (two) times daily by mouth.  . B Complex Vitamins (VITAMIN B COMPLEX PO) Take 1 capsule at bedtime by mouth.   . clopidogrel (PLAVIX) 75 MG tablet Take 1 tablet (75 mg total) by mouth daily.  Marland Kitchen dronedarone (MULTAQ) 400 MG tablet Take 1 tablet (400 mg total) 2 (two) times daily with a meal by mouth.  . famotidine (PEPCID) 20 MG tablet One at bedtime (Patient not taking: Reported on 11/18/2017)  . liraglutide (VICTOZA)  18 MG/3ML SOPN Inject 0.2 mLs (1.2 mg total) into the skin daily at 10 pm. Start with 0.6 mg for one week, then increase to 1.2 mg daily. (Patient not taking: Reported on 11/18/2017)  . Omega-3 Fatty Acids (FISH OIL) 1000 MG CAPS Take 2 capsules (2,000 mg total) by mouth 2 (two) times daily. (Patient not taking: Reported on 11/18/2017)  . pantoprazole (PROTONIX) 40 MG tablet TAKE  1 TABLET BY MOUTH ONCE A DAY 30 TO 60 MINUTES BEFORE FIRST MEAL OF THE DAY  (Patient not taking: Reported on 11/18/2017)  . rosuvastatin (CRESTOR) 10 MG tablet Take 5 mg by mouth at bedtime.    FAMILY HISTORY:  His indicated that his mother is alive. He indicated that his father is deceased. He indicated that his sister is deceased. He indicated that his brother is alive.   SOCIAL HISTORY: He  reports that he quit smoking about 5 years ago. His smoking use included cigarettes. He has a 40.00 pack-year smoking history. He has never used smokeless tobacco. He reports that he drinks alcohol. He reports that he does not use drugs.  REVIEW OF SYSTEMS:   Not available  SUBJECTIVE:  Intubated stable mechanical ventilatory support  VITAL SIGNS: BP 140/67   Pulse (!) 110   Temp 99.8 F (37.7 C) (Axillary)   Resp (!) 26   Ht 5\' 10"  (1.778 m)   Wt (!) 312 lb 2.7 oz (141.6 kg)   SpO2 95%   BMI 44.79 kg/m   HEMODYNAMICS:    VENTILATOR SETTINGS: Vent Mode: PRVC FiO2 (%):  [40 %-100 %] 40 % Set Rate:  [16 bmp-26 bmp] 26 bmp Vt Set:  [580 mL] 580 mL PEEP:  [5 cmH20-10 cmH20] 10 cmH20 Plateau Pressure:  [17 cmH20-27 cmH20] 21 cmH20  INTAKE / OUTPUT: I/O last 3 completed shifts: In: 3144 [I.V.:2944; IV Piggyback:200] Out: 2980 [Urine:2880; Blood:100]  PHYSICAL EXAMINATION: General:  Acutely ill appearing morbidly obese male Neuro:  Awake and following commands on the right HEENT:  [place]/AT, PERRL, EOM-I and MMM Cardiovascular:  RRR, Nl S1/S2 and -M/R/G Lungs:  Coarse BS diffusely Abdomen:  Soft, NT, ND and +BS Musculoskeletal:  -edema and -tenderness Skin:  Intact  LABS:  BMET Recent Labs  Lab 11/18/17 0928 11/18/17 0933 11/19/17 0437  NA 134* 135 137  K 4.3 4.2 3.6  CL 99 100 107  CO2 23  --  22  BUN 13 14 11   CREATININE 0.69 0.50* 0.54*  GLUCOSE 377* 381* 176*    Electrolytes Recent Labs  Lab 11/18/17 0928 11/19/17 0437  CALCIUM 9.8 8.5*  MG  --  1.6*  PHOS   --  2.9    CBC Recent Labs  Lab 11/18/17 0928 11/18/17 0933 11/18/17 1755 11/19/17 0437  WBC 9.3  --   --  13.1*  HGB 13.6 14.3 11.5* 11.2*  HCT 42.5 42.0 36.2* 35.2*  PLT 276  --   --  273    Coag's Recent Labs  Lab 11/18/17 0928  APTT 31  INR 0.91    Sepsis Markers No results for input(s): LATICACIDVEN, PROCALCITON, O2SATVEN in the last 168 hours.  ABG Recent Labs  Lab 11/18/17 1130 11/18/17 1355 11/19/17 0435  PHART 7.217* 7.473* 7.401  PCO2ART 64.8* 28.2* 38.6  PO2ART 230.0* 155.0* 124*    Liver Enzymes Recent Labs  Lab 11/18/17 0928  AST 24  ALT 34  ALKPHOS 58  BILITOT 1.0  ALBUMIN 3.5    Cardiac Enzymes No results for input(s):  TROPONINI, PROBNP in the last 168 hours.  Glucose Recent Labs  Lab 11/19/17 0250 11/19/17 0337 11/19/17 0500 11/19/17 0613 11/19/17 0735 11/19/17 0830  GLUCAP 173* 208* 169* 153* 157* 158*    Imaging Ct Angio Head W Or Wo Contrast  Result Date: 11/18/2017 CLINICAL DATA:  Clinical deterioration. Concern regarding basilar thrombosis. EXAM: CT ANGIOGRAPHY HEAD AND NECK TECHNIQUE: Multidetector CT imaging of the head and neck was performed using the standard protocol during bolus administration of intravenous contrast. Multiplanar CT image reconstructions and MIPs were obtained to evaluate the vascular anatomy. Carotid stenosis measurements (when applicable) are obtained utilizing NASCET criteria, using the distal internal carotid diameter as the denominator. CONTRAST:  173mL ISOVUE-370 IOPAMIDOL (ISOVUE-370) INJECTION 76% COMPARISON:  Earlier same day. FINDINGS: CT HEAD FINDINGS Brain: No change when compared to the previous head CT. Generalized atrophy. Chronic small-vessel changes of the pons and hemispheric white matter. No sign of acute infarction by CT. No hemorrhage. No hydrocephalus or extra-axial collection. Vascular: Extensive atherosclerotic calcification of the major vessels at the base the brain. Skull: Negative  Sinuses: Clear Orbits: Negative Review of the MIP images confirms the above findings CTA NECK FINDINGS Aortic arch: Atherosclerotic change without aneurysm or dissection. Right carotid system: Common carotid artery widely patent to the bifurcation region. Advanced calcified and soft plaque at the carotid bifurcation and proximal ICA. Than what diameter of the proximal ICA is 1 mm or less, consistent with 90% or greater stenosis. Beyond that, vessel shows reduced caliber but does show antegrade flow. Left carotid system: Common carotid artery shows some atherosclerotic plaque but no stenosis. At the left carotid bifurcation, there is soft and calcified plaque. At the distal bulb, minimal diameter is 3.5 mm. Compared to a more distal cervical ICA diameter of 5 mm, this indicates a 30% stenosis. Cervical ICA widely patent beyond that. Vertebral arteries: Vertebral artery origins are patent. Vertebral arteries are fairly small vessels but are patent through the cervical region to foramen magnum. Skeleton: No significant bone finding. 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 See prior report. Upper chest: New consolidation in the right upper lobe. Possible small pulmonary nodules. Superior mediastinal adenopathy. See prior report. Review of the MIP images confirms the above findings CTA HEAD FINDINGS Anterior circulation: Both internal carotid arteries are patent through the siphon regions with extensive atherosclerotic calcification. Stenosis in the siphon regions is estimated at 50-70% on each side. Supraclinoid internal carotid arteries are patent. Flow is seen in both anterior and middle cerebral vessels without discernible missing branches. Posterior circulation: Severe calcified atherosclerotic disease of the V4 segments on each side with severe stenoses. Accurate measurement not possible, but stenosis is quite likely  approaching 90% on each side. There is a proximal basilar stenosis estimated at 70%. Since the study of earlier today, there is occlusion of the distal basilar artery proximal to the superior cerebellar and the posterior cerebral arteries. These vessels do show flow, presumably indicating patent posterior communicating vessels. Venous sinuses: Patent and normal. Anatomic variants: None other significant. Delayed phase: No abnormal enhancement. Review of the MIP images confirms the above findings IMPRESSION: Since the study of earlier today, there are only 2 changes. There is new consolidation and volume loss in the right upper lobe. 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 Flow is seen distal to that however, presumably indicating patent posterior communicating arteries. The study was discussed at the time of  performance with Dr. Leonel Ramsay. Electronically Signed   By: Nelson Chimes M.D.   On: 11/18/2017 13:41   Ct Angio Head W Or Wo Contrast  Result Date: 11/18/2017 CLINICAL DATA:  Stroke.  Suspect cerebral hemorrhage EXAM: CT ANGIOGRAPHY HEAD AND NECK TECHNIQUE: Multidetector CT imaging of the head and neck was performed using the standard protocol during bolus administration of intravenous contrast. Multiplanar CT image reconstructions and MIPs were obtained to evaluate the vascular anatomy. Carotid stenosis measurements (when applicable) are obtained utilizing NASCET criteria, using the distal internal carotid diameter as the denominator. CONTRAST:  41mL ISOVUE-370 IOPAMIDOL (ISOVUE-370) INJECTION 76% COMPARISON:  CT head 11/18/2017 FINDINGS: CTA NECK FINDINGS Aortic arch: Atherosclerotic calcification aortic arch without aneurysm or dissection. Right carotid system: Atherosclerotic calcification right carotid bifurcation and proximal internal carotid artery. Severe stenosis proximal right internal carotid artery. Accurate measurement difficult due to heavily calcified vessel but this appears to be  greater than 90% diameter stenosis. Left carotid system: Mild atherosclerotic disease left carotid bifurcation without significant carotid stenosis. Vertebral arteries: Both vertebral arteries widely patent in the neck without significant stenosis. Severe stenosis distal vertebral artery bilaterally due to heavily calcified plaque. Skeleton: Negative Other neck: Left thyroid mass measures 2.5 x 4 cm with heterogeneous enhancement and coarse calcification, highly suspicious for carcinoma. Partially calcified 10 mm lymph node in the left neck level 3 node possibly metastatic node. Partially calcified left level 3 node 9 mm axial image 111 also suspicious for metastatic disease. Upper chest: 6.6 mm right upper lobe nodule. 5.5 mm right upper lobe nodule posteriorly. Left lung apex clear. Review of the MIP images confirms the above findings CTA HEAD FINDINGS Anterior circulation: Extensive atherosclerotic calcification in the cavernous carotid bilaterally. There appears to be severe stenosis in the right cavernous carotid and moderate stenosis on the left. Suboptimal opacification of the intracranial arteries due to timing of the injection. Diffuse atherosclerotic disease with mild diffuse narrowing of the left middle cerebral artery. Moderate stenosis left middle cerebral artery branch supplying the temporal lobe. Right middle cerebral artery branches show mild disease. Anterior cerebral arteries patent bilaterally with diffuse disease. Posterior circulation: Heavily calcified plaque in severe stenosis distal vertebral artery bilaterally. Basilar artery diffusely diseased with mild stenosis. Posterior cerebral arteries patent bilaterally without significant stenosis. Venous sinuses: Patent Anatomic variants: None Delayed phase: Not performed Review of the MIP images confirms the above findings IMPRESSION: 1. Severe intracranial and extracranial atherosclerotic disease. No emergent large vessel occlusion 2. Severe  stenosis right carotid bifurcation due to calcified plaque, estimated 90% diameter stenosis. No significant left carotid stenosis. Severe stenosis cavernous carotid bilaterally. 3. Both vertebral arteries are patent in the neck with severe calcific stenosis V4 segment bilaterally. 4. 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 Small calcified lymph nodes left neck suspicious for metastatic disease 5. Small lung nodules on the right, possible metastatic disease. Recommend chest CT for further evaluation. 6. These results were called by telephone at the time of interpretation on 11/18/2017 at 10:00 am to Dr. Leonel Ramsay , who verbally acknowledged these results. Electronically Signed   By: Franchot Gallo M.D.   On: 11/18/2017 10:10   Ct Angio Neck W Or Wo Contrast  Result Date: 11/18/2017 CLINICAL DATA:  Clinical deterioration. Concern regarding basilar thrombosis. EXAM: CT ANGIOGRAPHY HEAD AND NECK TECHNIQUE: Multidetector CT imaging of the head and neck was performed using the standard protocol during bolus administration of intravenous contrast. Multiplanar CT image reconstructions and MIPs were obtained  to evaluate the vascular anatomy. Carotid stenosis measurements (when applicable) are obtained utilizing NASCET criteria, using the distal internal carotid diameter as the denominator. CONTRAST:  143mL ISOVUE-370 IOPAMIDOL (ISOVUE-370) INJECTION 76% COMPARISON:  Earlier same day. FINDINGS: CT HEAD FINDINGS Brain: No change when compared to the previous head CT. Generalized atrophy. Chronic small-vessel changes of the pons and hemispheric white matter. No sign of acute infarction by CT. No hemorrhage. No hydrocephalus or extra-axial collection. Vascular: Extensive atherosclerotic calcification of the major vessels at the base the brain. Skull: Negative Sinuses: Clear Orbits: Negative Review of the MIP images confirms the above findings CTA NECK FINDINGS Aortic arch: Atherosclerotic change  without aneurysm or dissection. Right carotid system: Common carotid artery widely patent to the bifurcation region. Advanced calcified and soft plaque at the carotid bifurcation and proximal ICA. Than what diameter of the proximal ICA is 1 mm or less, consistent with 90% or greater stenosis. Beyond that, vessel shows reduced caliber but does show antegrade flow. Left carotid system: Common carotid artery shows some atherosclerotic plaque but no stenosis. At the left carotid bifurcation, there is soft and calcified plaque. At the distal bulb, minimal diameter is 3.5 mm. Compared to a more distal cervical ICA diameter of 5 mm, this indicates a 30% stenosis. Cervical ICA widely patent beyond that. Vertebral arteries: Vertebral artery origins are patent. Vertebral arteries are fairly small vessels but are patent through the cervical region to foramen magnum. Skeleton: No significant bone finding. 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 See prior report. Upper chest: New consolidation in the right upper lobe. Possible small pulmonary nodules. Superior mediastinal adenopathy. See prior report. Review of the MIP images confirms the above findings CTA HEAD FINDINGS Anterior circulation: Both internal carotid arteries are patent through the siphon regions with extensive atherosclerotic calcification. Stenosis in the siphon regions is estimated at 50-70% on each side. Supraclinoid internal carotid arteries are patent. Flow is seen in both anterior and middle cerebral vessels without discernible missing branches. Posterior circulation: Severe calcified atherosclerotic disease of the V4 segments on each side with severe stenoses. Accurate measurement not possible, but stenosis is quite likely approaching 90% on each side. There is a proximal basilar stenosis estimated at 70%. Since the study of earlier today, there is occlusion of the  distal basilar artery proximal to the superior cerebellar and the posterior cerebral arteries. These vessels do show flow, presumably indicating patent posterior communicating vessels. Venous sinuses: Patent and normal. Anatomic variants: None other significant. Delayed phase: No abnormal enhancement. Review of the MIP images confirms the above findings IMPRESSION: Since the study of earlier today, there are only 2 changes. There is new consolidation and volume loss in the right upper lobe. 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 Flow is seen distal to that however, presumably indicating patent posterior communicating arteries. The study was discussed at the time of performance with Dr. Leonel Ramsay. Electronically Signed   By: Nelson Chimes M.D.   On: 11/18/2017 13:41   Ct Angio Neck W Or Wo Contrast  Result Date: 11/18/2017 CLINICAL DATA:  Stroke.  Suspect cerebral hemorrhage EXAM: CT ANGIOGRAPHY HEAD AND NECK TECHNIQUE: Multidetector CT imaging of the head and neck was performed using the standard protocol during bolus administration of intravenous contrast. Multiplanar CT image reconstructions and MIPs were obtained to evaluate the vascular anatomy. Carotid stenosis measurements (when applicable) are obtained utilizing NASCET criteria, using the distal  internal carotid diameter as the denominator. CONTRAST:  57mL ISOVUE-370 IOPAMIDOL (ISOVUE-370) INJECTION 76% COMPARISON:  CT head 11/18/2017 FINDINGS: CTA NECK FINDINGS Aortic arch: Atherosclerotic calcification aortic arch without aneurysm or dissection. Right carotid system: Atherosclerotic calcification right carotid bifurcation and proximal internal carotid artery. Severe stenosis proximal right internal carotid artery. Accurate measurement difficult due to heavily calcified vessel but this appears to be greater than 90% diameter stenosis. Left carotid system: Mild atherosclerotic disease left carotid bifurcation without significant carotid  stenosis. Vertebral arteries: Both vertebral arteries widely patent in the neck without significant stenosis. Severe stenosis distal vertebral artery bilaterally due to heavily calcified plaque. Skeleton: Negative Other neck: Left thyroid mass measures 2.5 x 4 cm with heterogeneous enhancement and coarse calcification, highly suspicious for carcinoma. Partially calcified 10 mm lymph node in the left neck level 3 node possibly metastatic node. Partially calcified left level 3 node 9 mm axial image 111 also suspicious for metastatic disease. Upper chest: 6.6 mm right upper lobe nodule. 5.5 mm right upper lobe nodule posteriorly. Left lung apex clear. Review of the MIP images confirms the above findings CTA HEAD FINDINGS Anterior circulation: Extensive atherosclerotic calcification in the cavernous carotid bilaterally. There appears to be severe stenosis in the right cavernous carotid and moderate stenosis on the left. Suboptimal opacification of the intracranial arteries due to timing of the injection. Diffuse atherosclerotic disease with mild diffuse narrowing of the left middle cerebral artery. Moderate stenosis left middle cerebral artery branch supplying the temporal lobe. Right middle cerebral artery branches show mild disease. Anterior cerebral arteries patent bilaterally with diffuse disease. Posterior circulation: Heavily calcified plaque in severe stenosis distal vertebral artery bilaterally. Basilar artery diffusely diseased with mild stenosis. Posterior cerebral arteries patent bilaterally without significant stenosis. Venous sinuses: Patent Anatomic variants: None Delayed phase: Not performed Review of the MIP images confirms the above findings IMPRESSION: 1. Severe intracranial and extracranial atherosclerotic disease. No emergent large vessel occlusion 2. Severe stenosis right carotid bifurcation due to calcified plaque, estimated 90% diameter stenosis. No significant left carotid stenosis. Severe  stenosis cavernous carotid bilaterally. 3. Both vertebral arteries are patent in the neck with severe calcific stenosis V4 segment bilaterally. 4. 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 Small calcified lymph nodes left neck suspicious for metastatic disease 5. Small lung nodules on the right, possible metastatic disease. Recommend chest CT for further evaluation. 6. These results were called by telephone at the time of interpretation on 11/18/2017 at 10:00 am to Dr. Leonel Ramsay , who verbally acknowledged these results. Electronically Signed   By: Franchot Gallo M.D.   On: 11/18/2017 10:10   Ct Chest W Contrast  Result Date: 11/18/2017 CLINICAL DATA:  Unable to follow commands. EXAM: CT CHEST WITH CONTRAST TECHNIQUE: Multidetector CT imaging of the chest was performed during intravenous contrast administration. CONTRAST:  11mL ISOVUE-370 IOPAMIDOL (ISOVUE-370) INJECTION 76% COMPARISON:  Radiograph of same day.  CT scan of August 23, 2016. FINDINGS: Cardiovascular: Atherosclerosis of thoracic aorta is noted without aneurysm formation. Coronary artery calcifications are noted. No pericardial effusion is noted. Mild cardiomegaly is noted. Mediastinum/Nodes: Endotracheal and nasogastric tubes are in grossly good position. Stable 1 cm lymph node seen in aortopulmonary window. Stable 1.3 cm right paratracheal lymph node. Stable 1 cm right subcarinal lymph node. Bilateral calcified thyroid nodules are again noted. The largest measures 2.8 cm on the left. Lungs/Pleura: No pneumothorax or significant pleural effusion is noted. Right upper lobe airspace opacity with air bronchograms is noted concerning  for pneumonia or atelectasis. Minimal bibasilar subsegmental atelectasis is noted. Stable 8 mm nodule seen in lingular segment of left upper lobe. Upper Abdomen: No acute abnormality. Musculoskeletal: No chest wall abnormality. No acute or significant osseous findings. IMPRESSION: Right upper lobe  airspace opacity is noted with air bronchograms concerning for pneumonia or possibly atelectasis. Stable mediastinal adenopathy is noted compared to prior exam. Endotracheal and nasogastric tubes are in grossly good position. Coronary artery calcifications are noted suggesting coronary artery disease. Possible 2.8 cm left thyroid nodule. Thyroid ultrasound is recommended for further evaluation. Aortic Atherosclerosis (ICD10-I70.0). Electronically Signed   By: Marijo Conception, M.D.   On: 11/18/2017 13:47   Mr Brain Wo Contrast  Result Date: 11/19/2017 CLINICAL DATA:  62 y/o M; findings of stroke with progression of symptoms post basilar intervention. EXAM: MRI HEAD WITHOUT CONTRAST TECHNIQUE: Multiplanar, multiecho pulse sequences of the brain and surrounding structures were obtained without intravenous contrast. COMPARISON:  11/18/2017 CTA and CT of the head. 11/18/2017 cerebral angiography. FINDINGS: Brain: Reduced diffusion compatible with acute/early subacute infarction within the central pons measuring 2.4 x 2.6 x 1.1 cm (volume = 3.6 cm^3)(AP x ML x CC series 5, image 58 and series 7, image 56). The region of reduced diffusion is involves both sides of the pons, greater on the right. There additional punctate foci of infarction within the left cerebellar hemisphere (series 5, image 53) and the right parietal cortex (series 5, image 77). No associated hemorrhage or significant mass effect. Minimal chronic microvascular ischemic changes of the brain and mild brain parenchymal volume loss. Single punctate focus of susceptibility hypointensity within the right cerebellar hemisphere compatible with hemosiderin deposition of chronic microhemorrhage. No extra-axial collection, hydrocephalus, or herniation. Vascular: Normal flow voids. Skull and upper cervical spine: Normal marrow signal. Sinuses/Orbits: Diffuse paranasal sinus mucosal thickening and fluid levels likely due to intubation. Orbits are unremarkable.  Other: None. IMPRESSION: 1. 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 No associated hemorrhage or significant mass effect. 2. Additional punctate acute/early subacute infarctions within left lateral cerebellum and right parietal cortex. 3. Minimal chronic microvascular ischemic changes of the brain and mild parenchymal volume loss. These results will be called to the ordering clinician or representative by the Radiologist Assistant, and communication documented in the PACS or zVision Dashboard. Electronically Signed   By: Kristine Garbe M.D.   On: 11/19/2017 01:37   Dg Chest Port 1 View  Result Date: 11/18/2017 CLINICAL DATA:  Stroke symptoms. EXAM: PORTABLE CHEST 1 VIEW COMPARISON:  01/15/2017. FINDINGS: Endotracheal tube noted with tip 5 cm above the carina. NG tube noted with tip below left hemidiaphragm. Cardiomegaly with pulmonary vascular congestion and bilateral interstitial prominence suggesting CHF. No pleural effusion or pneumothorax. IMPRESSION: 1. Endotracheal tube tip noted 5 cm above the carina. NG tube tip noted below left hemidiaphragm. 2. Cardiomegaly with pulmonary venous congestion bilateral interstitial prominence suggesting mild CHF. Electronically Signed   By: Marcello Moores  Register   On: 11/18/2017 11:20   Ct Head Code Stroke Wo Contrast  Result Date: 11/18/2017 CLINICAL DATA:  Code stroke.  Left-sided deficit, slurred speech EXAM: CT HEAD WITHOUT CONTRAST TECHNIQUE: Contiguous axial images were obtained from the base of the skull through the vertex without intravenous contrast. COMPARISON:  None. FINDINGS: Brain: No evidence of acute infarction, hemorrhage, hydrocephalus, extra-axial collection or mass lesion/mass effect. Vascular: Negative for hyperdense vessel. Calcification in distal vertebral and carotid arteries is  advanced bilaterally Skull: Negative Sinuses/Orbits: Negative Other: None  ASPECTS ([place] Stroke Program Early CT Score) - Ganglionic level infarction (caudate, lentiform nuclei, internal capsule, insula, M1-M3 cortex): 7 - Supraganglionic infarction (M4-M6 cortex): 3 Total score (0-10 with 10 being normal): 10 IMPRESSION: 1. No acute intracranial abnormality.  Atherosclerotic calcification 2. ASPECTS is 10 Electronically Signed   By: Franchot Gallo M.D.   On: 11/18/2017 09:54     STUDIES:    CULTURES: 11/18/2017 sputum>>  ANTIBIOTICS: 11/18/2017 Unasyn>> SIGNIFICANT EVENTS: 11/18/2017 intubated  LINES/TUBES: 11/18/2017 endotracheal tube>>  DISCUSSION: Mr. Manard  is a 63 year old male with a plethora of health issues who presented to [facility] on 11/18/2017 after contacting 911 when he noted slurred speech and right facial.  He was transported to [facility] emergency department code stroke was initiated.  He had been on Eliquis in the past for paroxysmal atrial fibrillation have been stopped 3 weeks prior due to hemoptysis. 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  There was concern that TPA could not be given due to recent hemoptysis and erosion of the thyroid nodule into the airway. He was urgently intubated by Dr. Nelda Marseille and erosion site was covered with the balloon cuff.  Patient was then given TPA and will be transferred to the neuro stroke unit. He will most likely remain intubated for period of 24 to 48 hours.  He will need further evaluation for most likely cancerous process in the neck and the lung.  ASSESSMENT / PLAN:  PULMONARY A: Vent dependent respiratory failure secondary to urgent need for intubation for TPA infusion with recent hemoptysis and thyroid erosion and airway secondary to thyroid nodules is increased from 1.40 x2.5 to 10 in the ensuing several months months. Chronic obstructive pulmonary disease Former smoker Multiple lung nodules  suspicious for cancer  Apparently the source of bleeding was 3 cm below the cords on FOB. The patient is on 40% FIO2.  The aptient has a large consolidated area in the lung and mediastinal adenopahty. The adeniopathy could be related toi the disease in the thyroid. The area of consokidation may be a RTUL pneumonia but there could be an underling mass as well.   CARDIOVASCULAR A:  History of  Paroxysmal atrial fibrillation, currently off anticoagulation for recent hemoptysis 3 weeks duration Hypertension Coronary artery disease Peripheral vascular disease  The patient is in Atrial fib but I would be very loathe to fully anticoagulate him at this time   RENAL  A:   No acute renal issues P:   Monitor electrolytes Avoid nephrotoxins\  Thyroid Mass The patient likely has metastatic thyroid cancer. The thyroid may have eroded into the traches causing the beleeding yesterday.Will need to have ENT see thepatient inthenext few days.   GASTROINTESTINAL A:   History of GERD P:   Proton pump inhibitor Gastric tube May need tube feedings remains on the ventilator along with a 48-hour  HEMATOLOGIC A:   Chronic anticoagulation for atrial fibrillation Off anticoagulation for 3 weeks due to her hemoptysis Received TPA 11/18/2017 for right MCA stroke P:  Observe for possible hemorrhage Monitor hemoglobin hematocrit Hb today is 11.2,. It does not appear that there is active bleeding.  INFECTIOUS A:   Observed aspiration P:   Placed on Unasyn Culture as needed  ENDOCRINE CBG (last 3)  Recent Labs    11/19/17 0613 11/19/17 0735 11/19/17 0830  GLUCAP 153* 157* 158*  A:   Diabetes mellitus P:   Sliding scale insulin  PAD The patient has a cool right foot. vascualr has seen the patinet No intervention isd planned for now. Will montor. Reluctant to anticoagualt patient given the recent bleeding issues.  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 Patient with basilar artery thrombois and s/p intervention with stnet placement in the basilar artery and left VBJ jn.   FAMILY  - Updates: No family at bedside  - Inter-disciplinary family meet or Palliative Care meeting due by:  day 7

## 2017-11-19 NOTE — Progress Notes (Signed)
STROKE TEAM PROGRESS NOTE   HISTORY OF PRESENT ILLNESS (per record) [name] [name] is an 62 y.o. male with PMH HTN, DM2, HLD, afib ( was on eliquis) hemoptysis who presented to [facility] ED as a code stroke with left side weakness and slurred speech.   Per patient and girlfriend he woke up this morning and he felt normal. He walked to the door at 0600 to wave bye bye to his girlfriend. About 830 he called his girlfriend and was having slurred speech. She told him to call EMS. Patient states that it was about 0800 when he started " feeling funny". Patient has a history of coughing up blood in which he states he was removed from his eliquis 3 weeks ago. He reports that his last episode of coughing up blood was a few days ago. Denies any CP, SOB   In the ed:BG: 381, BP: was  161/110 Head CT was obtained and no acute intracranial abnormality. ASPECTS 10. Due to patient having hemoptysis pulmonology was consulted who agreed to intubate and bronch the patient at bedside before TPA decision was made. No LVO seen, not an IR candidate.  No previous history of stroke found during chart review. 04/14/2017 patient had a f/u visit for peripheral angiography and at that time he was on Eliquis and Plavix.   Date last known well: Date: 11/18/2017 Time last known well: Time: 08:00 tPA Given: Yes Modified Rankin: Rankin Score=0  NIHSS:10 1a Level of Conscious:0 1b LOC Questions: 0 1c LOC Commands: 0 2 Best Gaze: 0 3 Visual: 0 4 Facial Palsy: 1 5a Motor Arm - left: 4 5b Motor Arm - Right:  6a Motor Leg - Left: 4 6b Motor Leg - Right: 0 7 Limb Ataxia: 0 8 Sensory: 0 9 Best Language: 0 10 Dysarthria:1 11 Extinct. and Inattention:0 TOTAL:10     SUBJECTIVE (INTERVAL HISTORY) His family is not at bedside. Very complicated course. Today he is intubated without sedation. He opens eyes to stim, can gaze to both sides, nods head to simple questions, slight withdrawal to pain on the right and can move  toe on the left lower extremity.    OBJECTIVE Temp:  [99.4 F (37.4 C)-100.2 F (37.9 C)] 99.4 F (37.4 C) (06/28 1144) Pulse Rate:  [67-155] 67 (06/28 1430) Cardiac Rhythm: Atrial fibrillation (06/28 0800) Resp:  [0-43] 23 (06/28 1430) BP: (95-144)/(46-85) 128/59 (06/28 1430) SpO2:  [93 %-100 %] 94 % (06/28 1430) Arterial Line BP: (105-170)/(50-120) 136/65 (06/28 1530) FiO2 (%):  [40 %-50 %] 40 % (06/28 1128)  CBC:  Recent Labs  Lab 11/18/17 0928  11/18/17 1755 11/19/17 0437  WBC 9.3  --   --  13.1*  NEUTROABS 5.6  --   --  10.9*  HGB 13.6   < > 11.5* 11.2*  HCT 42.5   < > 36.2* 35.2*  MCV 89.1  --   --  89.6  PLT 276  --   --  273   < > = values in this interval not displayed.    Basic Metabolic Panel:  Recent Labs  Lab 11/18/17 0928 11/18/17 0933  11/18/17 1648 11/19/17 0437  NA 134* 135   < > 138 137  K 4.3 4.2   < > 4.0 3.6  CL 99 100  --   --  107  CO2 23  --   --   --  22  GLUCOSE 377* 381*   < > 258* 176*  BUN 13 14  --   --  11  CREATININE 0.69 0.50*  --   --  0.54*  CALCIUM 9.8  --   --   --  8.5*  MG  --   --   --   --  1.6*  PHOS  --   --   --   --  2.9   < > = values in this interval not displayed.    Lipid Panel:     Component Value Date/Time   CHOL 110 11/19/2017 0437   CHOL 119 08/09/2017 1000   TRIG 340 (H) 11/19/2017 0437   HDL 19 (L) 11/19/2017 0437   HDL 29 (L) 08/09/2017 1000   CHOLHDL 5.8 11/19/2017 0437   VLDL 68 (H) 11/19/2017 0437   LDLCALC 23 11/19/2017 0437   LDLCALC 30 08/09/2017 1000   HgbA1c:  Lab Results  Component Value Date   HGBA1C 9.1 (H) 11/19/2017   Urine Drug Screen: No results found for: LABOPIA, COCAINSCRNUR, LABBENZ, AMPHETMU, THCU, LABBARB  Alcohol Level No results found for: ETH  IMAGING  Ct Angio Head W Or Wo Contrast  Ct Angio Neck W Or Wo Contrast 11/18/2017 IMPRESSION:  Since the study of earlier today, there are only 2 changes. There is new consolidation and volume loss in the right upper  lobe. 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 Flow is seen distal to that however, presumably indicating patent posterior communicating arteries.    Ct Angio Head W Or Wo Contrast Ct Angio Neck W Or Wo Contrast 11/18/2017 IMPRESSION:  1. Severe intracranial and extracranial atherosclerotic disease. No emergent large vessel occlusion  2. Severe stenosis right carotid bifurcation due to calcified plaque, estimated 90% diameter stenosis. No significant left carotid stenosis. Severe stenosis cavernous carotid bilaterally.  3. Both vertebral arteries are patent in the neck with severe calcific stenosis V4 segment bilaterally.  4. 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 Small calcified lymph nodes left neck suspicious for metastatic disease  5. Small lung nodules on the right, possible metastatic disease. Recommend chest CT for further evaluation.    Ct Head Wo Contrast 11/19/2017 IMPRESSION:  1. Expected evolution of brainstem infarct without hemorrhage.  2. No new infarcts.  3. Left vertebral artery and basilar artery stent.  4. Acute on chronic sinus disease may be related to intubation.    Ct Chest W Contrast 11/18/2017 IMPRESSION Right upper lobe airspace opacity is noted with air bronchograms concerning for pneumonia or possibly atelectasis. Stable mediastinal adenopathy is noted compared to prior exam. Endotracheal and nasogastric tubes are in grossly good position. Coronary artery calcifications are noted suggesting coronary artery disease. Possible 2.8 cm left thyroid nodule. Thyroid ultrasound is recommended for further evaluation. Aortic Atherosclerosis (ICD10-I70.0).    Mr Brain Wo Contrast 11/19/2017 IMPRESSION:  1. 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 No associated hemorrhage or significant mass effect.  2. Additional punctate acute/early subacute  infarctions within left lateral cerebellum and right parietal cortex.  3. Minimal chronic microvascular ischemic changes of the brain and mild parenchymal volume loss.    Dg Chest Port 1 View 11/19/2017 IMPRESSION:  Stable appearance of the endotracheal tube. Nasogastric tube extends into the abdomen. Slightly improved aeration in lungs may represent decreasing atelectasis or edema.    Dg Chest Port 1 View 11/18/2017 IMPRESSION:  1. Endotracheal tube tip noted 5 cm above the carina. NG tube tip noted below left hemidiaphragm.  2.  Cardiomegaly with pulmonary venous congestion bilateral interstitial prominence suggesting mild CHF.    Ct Head Code Stroke Wo Contrast 11/18/2017 IMPRESSION:  1. No acute intracranial abnormality.  Atherosclerotic calcification  2. ASPECTS is 10     Cerebral Angiogram / Stent  11/18/2017 S/P 4 vessel cerebral artriogram RT CFA approach. Findings  1. 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 Occluded non dominant RT VBJ. 2.S/P endovascular revascularization of the distal basilar artery  To 90 % patency ,and of 30 to 40 % of the LT VBJ with stent placement. Dominant  Lt Pcom.    Transthoracic Echocardiogram - pending 00/00/00   PHYSICAL EXAM Vitals:   11/19/17 1300 11/19/17 1330 11/19/17 1400 11/19/17 1430  BP: 124/63 121/60 (!) 126/57 (!) 128/59  Pulse: (!) 114 (!) 125 (!) 121 67  Resp: (!) 26 (!) 26 (!) 0 (!) 23  Temp:      TempSrc:      SpO2: 95% 95% 93% 94%  Weight:      Height:         PHYSICAL EXAM Morbidly obese white male who is intubated not sedated . Afebrile. Head is nontraumatic. Neck is supple without bruit. Cardiac exam afib. Unable to hear lung sounds due to extremely large body habitus. Distal pulses are well felt. Neurological Exam : Intubated. Opens eyes to sternal rub. Globally aphasic. Midline gaze, can gaze to the left and right and track examiner.  No blink to threat face symmetric. Can nod head yes to simple question. Tongue appears midline difficult to assess due to ET. Cough and gag intact. Motor system exam reveals no spontaneous activity, withdraws to stim on the left minimally, on the right moves great toe, bilat plantar upgoing. Unable to test   ASSESSMENT/PLAN [name] is a 62 y.o. male with history diabetes mellitus, pneumonia, obstructive sleep apnea, hypertension, hyperlipidemia, coronary artery disease, atrial fibrillation on Eliquis which was recently stopped secondary to hemoptysis presenting with slurred speech, right sided weakness, respiratory distress and intubation. The patient received IV TPA and underwent PTA stenting of occluded basilar artery.  Stroke:  Brainstem infarct - embolic due to afib not on anticoagulation due to esophageal bleeding on Eliquis  CT head - Expected evolution of brainstem infarct without hemorrhage.   MRI head - Acute/early subacute infarction within the central pons involving both sides, greater on the right, measuring up to 2.6 cm, 3.6 cc.  MRA head - not performed  CTA H&N -severe bilateral carotid artery stenosis.  Carotid Doppler - CTA neck  2D Echo - pending  LDL - 23  HgbA1c - 9.1  VTE prophylaxis - SCDs Diet Order           Diet NPO time specified  Diet effective now          Eliquis (recently stopped) and plavix prior to admission, now on aspirin 81 mg daily and Brilinta  - Eliquis d/ced due to esophageal bleeding  Patient will be counseled to be compliant with his antithrombotic medications  Ongoing aggressive stroke risk factor management  Therapy recommendations:  pending  Disposition:  Pending  Hypertension  Stable . Permissive hypertension (OK if < 220/120) but gradually normalize in 5-7 days . Long-term BP goal normotensive  Hyperlipidemia  Lipid lowering medication PTA: Crestor 10 mg daily  LDL 23, goal < 70  Current lipid lowering  medication:  Will resume Crestor  Continue statin at discharge  Diabetes  HgbA1c 9.1, goal < 7.0  Uncontrolled  Other Stroke Risk Factors  Advanced age  Former cigarette smoker - quit, 40 pack year history  Morbid obesity  Uncontrolled vascular risk factors  ETOH use, advised to drink no more than 1 alcoholic beverage per day.  Obesity, Body mass index is 44.79 kg/m., recommend weight loss, diet and exercise as appropriate   Hx stroke/TIA  Coronary artery disease   peripheral vascular disease with claudication  Collagen Vascular Disease  Obstructive sleep apnea  Atrial fibrillation  OSA on cpap  COPD  Other Active Problems  Thyroid and pulmonary nodules consistent with malignancy, may need inpatient oncology and ENT consult discuss with family over the weekend. Likely metastatic thyroid cancer.  Mild anemia  Mild leukocytosis  Hypomagnesemia  Suspected aspiration pneumonia - IV Unasyn   Hospital day # 1  Garcia Dalzell is an 62 y.o. male with PMH HTN, DM2, HLD, afib (was on eliquis stopped due to bleeding) hemoptysis who presented to [facility] ED with pure motor left-sided weakness consistent with a small subcortical infarct which progressed to distal basilar occlusion.  He was given TPA. 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  I spent 30 minutes of neurocritical care time in the care of this patient.  Sarina Ill, MD [name]  To contact Stroke Continuity provider, please refer to [email or web address] After hours, contact General  Neurology

## 2017-11-19 NOTE — Progress Notes (Signed)
OT Cancellation Note  Patient Details Name: [name] [name] MRN: [id number] DOB: August 22, 1955   Cancelled Treatment:    Reason Eval/Treat Not Completed: Patient not medically ready.  Pt remains on vent.  Will check back.  [place], OTR/L [phone]   Lucille Passy M 11/19/2017, 7:20 AM

## 2017-11-20 ENCOUNTER — Inpatient Hospital Stay (HOSPITAL_COMMUNITY): Payer: 59

## 2017-11-20 DIAGNOSIS — E1159 Type 2 diabetes mellitus with other circulatory complications: Secondary | ICD-10-CM

## 2017-11-20 DIAGNOSIS — Z794 Long term (current) use of insulin: Secondary | ICD-10-CM

## 2017-11-20 DIAGNOSIS — I10 Essential (primary) hypertension: Secondary | ICD-10-CM

## 2017-11-20 DIAGNOSIS — I679 Cerebrovascular disease, unspecified: Secondary | ICD-10-CM

## 2017-11-20 LAB — GLUCOSE, CAPILLARY
GLUCOSE-CAPILLARY: 133 mg/dL — AB (ref 70–99)
GLUCOSE-CAPILLARY: 145 mg/dL — AB (ref 70–99)
GLUCOSE-CAPILLARY: 145 mg/dL — AB (ref 70–99)
GLUCOSE-CAPILLARY: 149 mg/dL — AB (ref 70–99)
GLUCOSE-CAPILLARY: 158 mg/dL — AB (ref 70–99)
GLUCOSE-CAPILLARY: 180 mg/dL — AB (ref 70–99)
Glucose-Capillary: 143 mg/dL — ABNORMAL HIGH (ref 70–99)
Glucose-Capillary: 149 mg/dL — ABNORMAL HIGH (ref 70–99)
Glucose-Capillary: 153 mg/dL — ABNORMAL HIGH (ref 70–99)
Glucose-Capillary: 154 mg/dL — ABNORMAL HIGH (ref 70–99)
Glucose-Capillary: 155 mg/dL — ABNORMAL HIGH (ref 70–99)
Glucose-Capillary: 157 mg/dL — ABNORMAL HIGH (ref 70–99)
Glucose-Capillary: 158 mg/dL — ABNORMAL HIGH (ref 70–99)
Glucose-Capillary: 158 mg/dL — ABNORMAL HIGH (ref 70–99)
Glucose-Capillary: 161 mg/dL — ABNORMAL HIGH (ref 70–99)
Glucose-Capillary: 164 mg/dL — ABNORMAL HIGH (ref 70–99)
Glucose-Capillary: 168 mg/dL — ABNORMAL HIGH (ref 70–99)
Glucose-Capillary: 170 mg/dL — ABNORMAL HIGH (ref 70–99)
Glucose-Capillary: 177 mg/dL — ABNORMAL HIGH (ref 70–99)
Glucose-Capillary: 177 mg/dL — ABNORMAL HIGH (ref 70–99)
Glucose-Capillary: 185 mg/dL — ABNORMAL HIGH (ref 70–99)

## 2017-11-20 LAB — MAGNESIUM: MAGNESIUM: 2.2 mg/dL (ref 1.7–2.4)

## 2017-11-20 LAB — ECHOCARDIOGRAM COMPLETE
Height: 70 in
Weight: 4994.74 oz

## 2017-11-20 LAB — PHOSPHORUS: Phosphorus: 3 mg/dL (ref 2.5–4.6)

## 2017-11-20 MED ORDER — VITAL HIGH PROTEIN PO LIQD
1000.0000 mL | ORAL | Status: DC
  Administered 2017-11-20 – 2017-11-27 (×10): 1000 mL

## 2017-11-20 MED ORDER — PRO-STAT SUGAR FREE PO LIQD
30.0000 mL | Freq: Three times a day (TID) | ORAL | Status: DC
  Administered 2017-11-20 – 2017-12-12 (×67): 30 mL
  Filled 2017-11-20 (×66): qty 30

## 2017-11-20 MED ORDER — ACETAMINOPHEN 10 MG/ML IV SOLN
1000.0000 mg | Freq: Once | INTRAVENOUS | Status: AC
  Administered 2017-11-20: 1000 mg via INTRAVENOUS
  Filled 2017-11-20: qty 100

## 2017-11-20 MED ORDER — METOPROLOL TARTRATE 5 MG/5ML IV SOLN
5.0000 mg | Freq: Two times a day (BID) | INTRAVENOUS | Status: DC
  Administered 2017-11-20 (×3): 5 mg via INTRAVENOUS
  Filled 2017-11-20 (×3): qty 5

## 2017-11-20 NOTE — Progress Notes (Signed)
Nutrition Follow-up  DOCUMENTATION CODES:   Morbid obesity  INTERVENTION:  Initiate TF via OGT with Vital High Protein at goal rate of 65 ml/h (1560 ml per day) and Prostat 30 ml TID to provide 1860 kcals, 181 gm protein, 1310 ml free water daily.  NUTRITION DIAGNOSIS:   Inadequate oral intake related to inability to eat as evidenced by NPO status; ongoing  GOAL:   Provide needs based on ASPEN/SCCM guidelines; ongoing  MONITOR:   I & O's  REASON FOR ASSESSMENT:   Ventilator    ASSESSMENT:   Pt with PMH of vascular dx, CAD, COPD, GERD, HLD, HTN, OSA, afib (off anticoagulation x 3 weeks due to hemoptysis), and DM admitted 6/27 with R MCA stroke s/p tPA and revascularization x 2 on 6/27. Pt noted to have a large thyroid mass and and multiple lung nodules suspicious for metastatic thyroid cancer. Also noted coolness of R distal leg, vascular has deferred interventions for now.   Patient is currently intubated on ventilator support MV: 14.6 L/min Temp (24hrs), Avg:100 F (37.8 C), Min:98.4 F (36.9 C), Max:101.1 F (38.4 C)  Propofol: none   Pt did not tolerate SBT trial this AM. RD consulted for enteral nutrition initiation. RD to put in tube feeding orders. Labs and medications reviewed.   Diet Order:   Diet Order           Diet NPO time specified  Diet effective now          EDUCATION NEEDS:   No education needs have been identified at this time  Skin:  Skin Assessment: Reviewed RN Assessment  Last BM:  unknown  Height:   Ht Readings from Last 1 Encounters:  11/18/17 5\' 10"  (1.778 m)    Weight:   Wt Readings from Last 1 Encounters:  11/18/17 (!) 312 lb 2.7 oz (141.6 kg)    Ideal Body Weight:  75.4 kg  BMI:  Body mass index is 44.79 kg/m.  Estimated Nutritional Needs:   Kcal:  4492-0100  Protein:  151-188 grams  Fluid:  2 L/day    Corrin Parker, MS, RD, LDN Pager # [phone] After hours/ weekend pager # [phone]

## 2017-11-20 NOTE — Progress Notes (Signed)
PULMONARY / CRITICAL CARE MEDICINE   Name: [name] [name] MRN: [id number] DOB: 04-05-1956    ADMISSION DATE:  11/18/2017 CONSULTATION DATE: 11/18/2017  REFERRING MD: Emergency department  CHIEF COMPLAINT: Stroke  HISTORY OF PRESENT ILLNESS:    [name] [name]  is a 62 year old male with a plethora of health issues who presented to [facility] on 11/18/2017 after contacting 911 when he noted slurred speech and right facial.  He was transported to [facility] emergency department code stroke was initiated.  He had been on Eliquis in the past for paroxysmal atrial fibrillation have been stopped 3 weeks prior due to hemoptysis. 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  There was concern that TPA could not be given due to recent hemoptysis and erosion of the thyroid nodule into the airway. He was urgently intubated by Dr. Nelda Marseille and erosion site was covered with the balloon cuff.  Patient was then given TPA and will be transferred to the neuro stroke unit. He will most likely remain intubated for period of 24 to 48 hours.  He will need further evaluation for most likely cancerous process in the neck and the lung.  6/28 The patient is on a ventialtor in the ICU. He had an intervention on the basilar artery and left vertebrobasilar jn. His vitals are stable. The apotient does have significant residual disease in the right proximal ICA. He developed coolness and pallor in the right distal leg. The foot is cool.He has been cseen by the [employer]. 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 The patient was apparently not aware of this finding preadmission.  6/29 The patient is off all sedation presently. He is responsive and able to follow commands. Neurology and I  had a long discussion with his family and girlfriend about his  condition and plans. The patient has stable vitals. He did not have  A cuff leak this AM.     PAST MEDICAL HISTORY :  He  has a past medical history of Collagen vascular disease ([place]), Coronary artery disease, GERD (gastroesophageal reflux disease), High cholesterol, Hypertension, OSA (obstructive sleep apnea), Pneumonia (2011), and Type II diabetes mellitus ([place]) (07/2016).  PAST SURGICAL HISTORY: He  has a past surgical history that includes Video bronchoscopy (Bilateral, 08/25/2016); Lower Extremity Angiography (Bilateral, 04/01/2017); PERIPHERAL VASCULAR INTERVENTION (Left, 04/01/2017); Coronary angioplasty with stent (2011; 07/2016); and Radiology with anesthesia (N/A, 11/18/2017).  No Known Allergies  No current facility-administered medications on file prior to encounter.    Current Outpatient Medications on File Prior to Encounter  Medication Sig  . apixaban (ELIQUIS) 5 MG TABS tablet Take 1 tablet (5 mg total) 2 (two) times daily by mouth.  . B Complex Vitamins (VITAMIN B COMPLEX PO) Take 1 capsule at bedtime by mouth.   . clopidogrel (PLAVIX) 75 MG tablet Take 1 tablet (75 mg total) by mouth daily.  Marland Kitchen dronedarone (MULTAQ) 400 MG tablet Take 1 tablet (400 mg total) 2 (two) times daily with a meal by mouth.  . famotidine (PEPCID) 20 MG tablet One at bedtime  . fenofibrate 160 MG tablet Take 1 tablet (160 mg total) by mouth daily.  . irbesartan (AVAPRO) 75 MG tablet TAKE ONE TABLET BY MOUTH ONE TIME DAILY   . liraglutide (VICTOZA) 18 MG/3ML SOPN Inject 0.2 mLs (1.2 mg total) into the skin daily at 10 pm. Start with  0.6 mg for one week, then increase to 1.2 mg daily.  . metFORMIN (GLUCOPHAGE) 1000 MG tablet Take 1,000 mg by mouth 2 (two) times daily with a meal.  . metoprolol tartrate (LOPRESSOR) 50 MG tablet Take 1 tablet (50 mg total) by mouth 2 (two) times daily.  . nitroGLYCERIN (NITROSTAT) 0.4 MG SL tablet Place 1 tablet (0.4 mg total) under the tongue every 5 (five) minutes as  needed for chest pain.  . Omega-3 Fatty Acids (FISH OIL) 1000 MG CAPS Take 2 capsules (2,000 mg total) by mouth 2 (two) times daily.  . pantoprazole (PROTONIX) 40 MG tablet TAKE 1 TABLET BY MOUTH ONCE A DAY 30 TO 60 MINUTES BEFORE FIRST MEAL OF THE DAY   . rosuvastatin (CRESTOR) 10 MG tablet Take 5 mg by mouth at bedtime.    FAMILY HISTORY:  His indicated that his mother is alive. He indicated that his father is deceased. He indicated that his sister is deceased. He indicated that his brother is alive.   SOCIAL HISTORY: He  reports that he quit smoking about 5 years ago. His smoking use included cigarettes. He has a 40.00 pack-year smoking history. He has never used smokeless tobacco. He reports that he drinks alcohol. He reports that he does not use drugs.  REVIEW OF SYSTEMS:   Not available  SUBJECTIVE:  Intubated stable mechanical ventilatory support  VITAL SIGNS: BP 126/61   Pulse 90   Temp (!) 101.1 F (38.4 C) (Axillary) Comment: tylenol PRN given   Resp 19   Ht 5\' 10"  (1.778 m)   Wt (!) 312 lb 2.7 oz (141.6 kg)   SpO2 97%   BMI 44.79 kg/m   HEMODYNAMICS:    VENTILATOR SETTINGS: Vent Mode: PRVC FiO2 (%):  [40 %] 40 % Set Rate:  [26 bmp] 26 bmp Vt Set:  [580 mL] 580 mL PEEP:  [5 cmH20-10 cmH20] 5 cmH20 Plateau Pressure:  [19 cmH20-23 cmH20] 19 cmH20  INTAKE / OUTPUT: I/O last 3 completed shifts: In: 6171.8 [I.V.:5771.6; IV Piggyback:400.2] Out: 1065 [Urine:965; Emesis/NG output:100]  PHYSICAL EXAMINATION: General:  Acutely ill appearing morbidly obese male Neuro:  Awake and following commands on the right HEENT:  [place]/AT, PERRL, EOM-I and MMM Cardiovascular:  RRR, Nl S1/S2 and -M/R/G Lungs:  Coarse BS diffusely Abdomen:  Soft, NT, ND and +BS Musculoskeletal:  -edema and -tenderness Skin:  Intact  LABS:  BMET Recent Labs  Lab 11/18/17 0928 11/18/17 0933  11/18/17 1539 11/18/17 1648 11/19/17 0437  NA 134* 135   < > 136 138 137  K 4.3 4.2   < > 4.1  4.0 3.6  CL 99 100  --   --   --  107  CO2 23  --   --   --   --  22  BUN 13 14  --   --   --  11  CREATININE 0.69 0.50*  --   --   --  0.54*  GLUCOSE 377* 381*   < > 302* 258* 176*   < > = values in this interval not displayed.    Electrolytes Recent Labs  Lab 11/18/17 0928 11/19/17 0437  CALCIUM 9.8 8.5*  MG  --  1.6*  PHOS  --  2.9    CBC Recent Labs  Lab 11/18/17 0928  11/18/17 1648 11/18/17 1755 11/19/17 0437  WBC 9.3  --   --   --  13.1*  HGB 13.6   < > 10.5* 11.5* 11.2*  HCT  42.5   < > 31.0* 36.2* 35.2*  PLT 276  --   --   --  273   < > = values in this interval not displayed.    Coag's Recent Labs  Lab 11/18/17 0928  APTT 31  INR 0.91    Sepsis Markers No results for input(s): LATICACIDVEN, PROCALCITON, O2SATVEN in the last 168 hours.  ABG Recent Labs  Lab 11/18/17 1355 11/18/17 1450 11/19/17 0435  PHART 7.473* 7.291* 7.401  PCO2ART 28.2* 53.1* 38.6  PO2ART 155.0* 378.0* 124*    Liver Enzymes Recent Labs  Lab 11/18/17 0928  AST 24  ALT 34  ALKPHOS 58  BILITOT 1.0  ALBUMIN 3.5    Cardiac Enzymes No results for input(s): TROPONINI, PROBNP in the last 168 hours.  Glucose Recent Labs  Lab 11/20/17 0254 11/20/17 0357 11/20/17 0506 11/20/17 0656 11/20/17 0757 11/20/17 0900  GLUCAP 154* 145* 149* 133* 157* 180*    Imaging Ct Head Wo Contrast  Result Date: 11/19/2017 CLINICAL DATA:  Severe stenosis of the distal vertebral arteries. Proximal basilar artery stenosis. Distal basilar artery occlusion. Pontine infarct. EXAM: CT HEAD WITHOUT CONTRAST TECHNIQUE: Contiguous axial images were obtained from the base of the skull through the vertex without intravenous contrast. COMPARISON:  MRI brain 11/19/2017 12:49 a.m.  Are FINDINGS: Brain: Hypoattenuating area associated with the pontine infarct is again seen. There is no interval hemorrhage. Brainstem and cerebellum are otherwise normal. No supratentorial infarct is present. No acute  hemorrhage or mass lesion is present. Vascular: Left distal vertebral artery stent extends into the basilar artery. Atherosclerotic calcifications are present at the dural margin of both vertebral arteries and within the cavernous internal carotid arteries. There is no hyperdense vessel. Skull: Calvarium is intact. No focal lytic or blastic lesions are present. Sinuses/Orbits: Diffuse mucosal thickening is present throughout the ethmoid air cells. Fluid levels are present in the sphenoid sinuses, frontal sinuses, and maxillary sinuses. The mastoid air cells are clear. Bilateral exophthalmos is stable. Globes and orbits are otherwise within normal limits. IMPRESSION: 1. Expected evolution of brainstem infarct without hemorrhage. 2. No new infarcts. 3. Left vertebral artery and basilar artery stent. 4. Acute on chronic sinus disease may be related to intubation. Electronically Signed   By: San Morelle M.D.   On: 11/19/2017 11:40     STUDIES:    CULTURES: 11/18/2017 sputum>>  ANTIBIOTICS: 11/18/2017 Unasyn>> SIGNIFICANT EVENTS: 11/18/2017 intubated  LINES/TUBES: 11/18/2017 endotracheal tube>>  DISCUSSION: Mr. Degidio  is a 62 year old male with a plethora of health issues who presented to [facility] on 11/18/2017 after contacting 911 when he noted slurred speech and right facial.  He was transported to [facility] emergency department code stroke was initiated.  He had been on Eliquis in the past for paroxysmal atrial fibrillation have been stopped 3 weeks prior due to hemoptysis. 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  There was concern that TPA could not be given due to recent hemoptysis and erosion of the thyroid nodule into the airway. He was urgently intubated by Dr. Nelda Marseille and erosion site was covered with the balloon cuff.  Patient was then given TPA and will be transferred to the  neuro stroke unit. He will most likely remain intubated for period of 24 to 48 hours.  He will need further evaluation for most likely cancerous process in the neck and the lung.  ASSESSMENT / PLAN:  PULMONARY A: Vent dependent respiratory failure secondary to urgent need for intubation for TPA infusion with recent hemoptysis and thyroid erosion and airway secondary to thyroid nodules is increased from 1.40 x2.5 to 10 in the ensuing several months months. Chronic obstructive pulmonary disease Former smoker Multiple lung nodules suspicious for cancer  Apparently the source of bleeding was 3 cm below the cords on FOB. The patient is on 40% FIO2.  The patient has a large consolidated area in the lung and mediastinal adenopahty. The adenopathy could be related to the disease in the thyroid. The area of consolidation may be a RUL pneumonia but there could be an underling mass as well.  6/29  As noted the patient did not have a cuff leak. An SBT trial was started this AM but the patient did not tolerate it and became tachypneic in a short time. There appears to be a large node abutting the trachea on the left on his CT scan. I am going to have cardiothoracic weight in on it.   CARDIOVASCULAR A:  History of  Paroxysmal atrial fibrillation, currently off anticoagulation for recent hemoptysis 3 weeks duration Hypertension Coronary artery disease Peripheral vascular disease  The patient is in Atrial fib but I would be very loathe to fully anticoagulate him at this time.   RENAL  A:   No acute renal issues P:   Monitor electrolytes Avoid nephrotoxins. The patient's urine output is not great and his urine is a little dark. His creatinine is on thenormal range presently. The patient isgetting 100cc/hr plus some solusets.  Thyroid Mass The patient likely has metastatic thyroid cancer. The thyroid may have eroded into the traches causing the beleeding yesterday.Will need to have ENT see  thepatient in the next few days. I have requested an US guided bx with the IR service not likerly to happen before Monday.   GASTROINTESTINAL A:   History of GERD P:   Proton pump inhibitor Gastric tube  Will start OGT feeds  HEMATOLOGIC A:   Chronic anticoagulation for atrial fibrillation Off anticoagulation for 3 weeks due to her hemoptysis Received TPA 11/18/2017 for right MCA stroke P:  Observe for possible hemorrhage Monitor hemoglobin hematocrit Hb today is 11.2,. It does not appear that there is active bleeding. We can proably place the patient on low dose heparin for DVT prophylaxis.  INFECTIOUS A:   Observed aspiration P:   Placed on Unasyn Culture as needed  ENDOCRINE CBG (last 3)  Recent Labs    11/20/17 0656 11/20/17 0757 11/20/17 0900  GLUCAP 133* 157* 180*    A:   Diabetes mellitus P:   Sliding scale insulin  PAD The patient has a cool right foot. vascualr has seen the patient No intervention isd planned for now. Will montor. Reluctant to anticoagualt patient given the recent bleeding issues. 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 Patient with basilar artery thrombois and s/p intervention with stnet placement in the basilar artery and left VBJ jn. Discussed with neurology. 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 Can move the roight side fairly well.   FAMILY  We discussed the major issues with his family at  the bedside

## 2017-11-20 NOTE — Progress Notes (Signed)
Patient familiar to [facility] service s/p cerebral angiogram with revascularization of the distal basiliar artery 11/18/17.  Patient now remains intubated due to tracheal erosion, possibly from thyroid mass. IR aware of request for thyroid mass biopsy. Case reviewed by Dr. Vernard Gambles who recommends continued efforts towards stabilization and ongoing evaluation of mass, adenopathy, hemoptysis.  Could consider additional CT imaging.   Patient has been assessed by ENT; note possible need for tracheostomy.  Biopsy can be arranged as an outpatient once patient medical stable.   Brynda Greathouse, MS RD PA-C

## 2017-11-20 NOTE — Consult Note (Signed)
Reason for Consult: Airway obstruction, possible thyroid mass Referring Physician: CCM  Draylen Lobue is an 62 y.o. male.  HPI: 62 year old male with hemoptysis for a year presented with new pontine stroke two days ago.  He has been on Eliquis for A fib that had been stopped due to hemoptysis.  He was intubated for bronchoscopy prior to giving TPA for the stroke.  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  He has had chest imaging.  Consultation was requested concerning his airway and the thyroid/mediastinal mass.  Past Medical History:  Diagnosis Date  . Collagen vascular disease ([place])   . Coronary artery disease   . GERD (gastroesophageal reflux disease)   . High cholesterol   . Hypertension   . OSA (obstructive sleep apnea)    "dx'd years ago; never have worn mask; mask never ordered" (04/01/2017)  . Pneumonia 2011  . Type II diabetes mellitus ([place]) 07/2016    Past Surgical History:  Procedure Laterality Date  . CORONARY ANGIOPLASTY WITH STENT PLACEMENT  2011; 07/2016   s/p PCI  . LOWER EXTREMITY ANGIOGRAPHY Bilateral 04/01/2017   Procedure: Lower Extremity Angiography;  Surgeon: Lorretta Harp, MD;  Location: [facility];  Service: Cardiovascular;  Laterality: Bilateral;  . PERIPHERAL VASCULAR INTERVENTION Left 04/01/2017   Procedure: PERIPHERAL VASCULAR INTERVENTION;  Surgeon: Lorretta Harp, MD;  Location: [facility];  Service: Cardiovascular;  Laterality: Left;  common iliac  . RADIOLOGY WITH ANESTHESIA N/A 11/18/2017   Procedure: RADIOLOGY WITH ANESTHESIA;  Surgeon: Luanne Bras, MD;  Location: [facility];  Service: Radiology;  Laterality: N/A;  . VIDEO BRONCHOSCOPY Bilateral 08/25/2016   Procedure: VIDEO BRONCHOSCOPY WITHOUT FLUORO;  Surgeon: Collene Gobble, MD;  Location: [facility];  Service: Cardiopulmonary;  Laterality: Bilateral;    Family History  Problem Relation Age of Onset  . Alzheimer's  disease Mother   . Breast cancer Sister     Social History:  reports that he quit smoking about 5 years ago. His smoking use included cigarettes. He has a 40.00 pack-year smoking history. He has never used smokeless tobacco. He reports that he drinks alcohol. He reports that he does not use drugs.  Allergies: No Known Allergies  Medications: I have reviewed the patient's current medications.  Results for orders placed or performed during the hospital encounter of 11/18/17 (from the past 48 hour(s))  Type and screen [place]     Status: None   Collection Time: 11/18/17 10:53 AM  Result Value Ref Range   ABO/RH(D) B POS    Antibody Screen NEG    Sample Expiration      11/21/2017 Performed at [facility], [place] [address] [place], [place] [address]   I-Stat arterial blood gas, ED     Status: Abnormal   Collection Time: 11/18/17 11:30 AM  Result Value Ref Range   pH, Arterial 7.217 (L) 7.350 - 7.450   pCO2 arterial 64.8 (H) 32.0 - 48.0 mmHg   pO2, Arterial 230.0 (H) 83.0 - 108.0 mmHg   Bicarbonate 26.6 20.0 - 28.0 mmol/L   TCO2 29 22 - 32 mmol/L   O2 Saturation 100.0 %   Acid-base deficit 3.0 (H) 0.0 - 2.0 mmol/L   Patient temperature 97.2 F    Collection site RADIAL, ALLEN'S TEST ACCEPTABLE    Drawn by RT    Sample type ARTERIAL    Comment  NOTIFIED PHYSICIAN   Glucose, capillary     Status: Abnormal   Collection Time: 11/18/17 12:10 PM  Result Value Ref Range   Glucose-Capillary 435 (H) 70 - 99 mg/dL  MRSA PCR Screening     Status: None   Collection Time: 11/18/17 12:26 PM  Result Value Ref Range   MRSA by PCR NEGATIVE NEGATIVE    Comment:        The GeneXpert MRSA Assay (FDA approved for NASAL specimens only), is one component of a comprehensive MRSA colonization surveillance program. It is not intended to diagnose MRSA infection nor to guide or monitor treatment for MRSA infections. Performed at [facility], [place] [address]  [place], [place] [address]   Glucose, capillary     Status: Abnormal   Collection Time: 11/18/17  1:31 PM  Result Value Ref Range   Glucose-Capillary 377 (H) 70 - 99 mg/dL  Triglycerides     Status: Abnormal   Collection Time: 11/18/17  1:39 PM  Result Value Ref Range   Triglycerides 424 (H) <150 mg/dL    Comment: Performed at [facility] [address], [place], [place] [address]  HIV antibody (Routine Testing)     Status: None   Collection Time: 11/18/17  1:39 PM  Result Value Ref Range   HIV Screen 4th Generation wRfx Non Reactive Non Reactive    Comment: (NOTE) Performed At: [facility] [address] [place], [place] [id number] Rush Farmer MD [id number] Performed at [facility], [place] [address] [place], [place] [address]   I-STAT 3, arterial blood gas (G3+)     Status: Abnormal   Collection Time: 11/18/17  1:55 PM  Result Value Ref Range   pH, Arterial 7.473 (H) 7.350 - 7.450   pCO2 arterial 28.2 (L) 32.0 - 48.0 mmHg   pO2, Arterial 155.0 (H) 83.0 - 108.0 mmHg   Bicarbonate 20.8 20.0 - 28.0 mmol/L   TCO2 22 22 - 32 mmol/L   O2 Saturation 100.0 %   Acid-base deficit 2.0 0.0 - 2.0 mmol/L   Patient temperature 97.5 F    Collection site RADIAL, ALLEN'S TEST ACCEPTABLE    Drawn by Operator    Sample type ARTERIAL   I-STAT 4, (NA,K, GLUC, HGB,HCT)     Status: Abnormal   Collection Time: 11/18/17  2:39 PM  Result Value Ref Range   Sodium 134 (L) 135 - 145 mmol/L   Potassium 4.7 3.5 - 5.1 mmol/L   Glucose, Bld 391 (H) 70 - 99 mg/dL   HCT 36.0 (L) 39.0 - 52.0 %   Hemoglobin 12.2 (L) 13.0 - 17.0 g/dL  I-STAT 7, (LYTES, BLD GAS, ICA, H+H)     Status: Abnormal   Collection Time: 11/18/17  2:50 PM  Result Value Ref Range   pH, Arterial 7.291 (L) 7.350 - 7.450   pCO2 arterial 53.1 (H) 32.0 - 48.0 mmHg   pO2, Arterial 378.0 (H) 83.0 - 108.0 mmHg   Bicarbonate 25.6 20.0 - 28.0 mmol/L   TCO2 27 22 - 32 mmol/L   O2 Saturation 100.0 %   Acid-base deficit  2.0 0.0 - 2.0 mmol/L   Sodium 135 135 - 145 mmol/L   Potassium 4.7 3.5 - 5.1 mmol/L   Calcium, Ion 1.25 1.15 - 1.40 mmol/L   HCT 36.0 (L) 39.0 - 52.0 %   Hemoglobin 12.2 (L) 13.0 - 17.0 g/dL   Patient temperature HIDE    Sample type ARTERIAL   I-STAT 4, (  NA,K, GLUC, HGB,HCT)     Status: Abnormal   Collection Time: 11/18/17  3:39 PM  Result Value Ref Range   Sodium 136 135 - 145 mmol/L   Potassium 4.1 3.5 - 5.1 mmol/L   Glucose, Bld 302 (H) 70 - 99 mg/dL   HCT 32.0 (L) 39.0 - 52.0 %   Hemoglobin 10.9 (L) 13.0 - 17.0 g/dL  I-STAT 4, (NA,K, GLUC, HGB,HCT)     Status: Abnormal   Collection Time: 11/18/17  4:48 PM  Result Value Ref Range   Sodium 138 135 - 145 mmol/L   Potassium 4.0 3.5 - 5.1 mmol/L   Glucose, Bld 258 (H) 70 - 99 mg/dL   HCT 31.0 (L) 39.0 - 52.0 %   Hemoglobin 10.5 (L) 13.0 - 17.0 g/dL  Glucose, capillary     Status: Abnormal   Collection Time: 11/18/17  5:52 PM  Result Value Ref Range   Glucose-Capillary 230 (H) 70 - 99 mg/dL  Hemoglobin and hematocrit, blood     Status: Abnormal   Collection Time: 11/18/17  5:55 PM  Result Value Ref Range   Hemoglobin 11.5 (L) 13.0 - 17.0 g/dL   HCT 36.2 (L) 39.0 - 52.0 %    Comment: Performed at [facility], [address] [place], [place] [address]  Glucose, capillary     Status: Abnormal   Collection Time: 11/18/17  6:55 PM  Result Value Ref Range   Glucose-Capillary 204 (H) 70 - 99 mg/dL  Glucose, capillary     Status: Abnormal   Collection Time: 11/18/17  7:57 PM  Result Value Ref Range   Glucose-Capillary 208 (H) 70 - 99 mg/dL  Glucose, capillary     Status: Abnormal   Collection Time: 11/18/17  9:33 PM  Result Value Ref Range   Glucose-Capillary 180 (H) 70 - 99 mg/dL  Glucose, capillary     Status: Abnormal   Collection Time: 11/18/17 10:38 PM  Result Value Ref Range   Glucose-Capillary 193 (H) 70 - 99 mg/dL  Glucose, capillary     Status: Abnormal   Collection Time: 11/18/17 11:42 PM  Result Value Ref  Range   Glucose-Capillary 163 (H) 70 - 99 mg/dL  Glucose, capillary     Status: Abnormal   Collection Time: 11/19/17  1:40 AM  Result Value Ref Range   Glucose-Capillary 220 (H) 70 - 99 mg/dL  Glucose, capillary     Status: Abnormal   Collection Time: 11/19/17  2:50 AM  Result Value Ref Range   Glucose-Capillary 173 (H) 70 - 99 mg/dL  Glucose, capillary     Status: Abnormal   Collection Time: 11/19/17  3:37 AM  Result Value Ref Range   Glucose-Capillary 208 (H) 70 - 99 mg/dL   Comment 1 Notify RN    Comment 2 Document in Chart   Blood gas, arterial     Status: Abnormal   Collection Time: 11/19/17  4:35 AM  Result Value Ref Range   FIO2 50.00    Delivery systems VENTILATOR    Mode PRESSURE REGULATED VOLUME CONTROL    VT 440 mL   LHR 26 resp/min   Peep/cpap 10.0 cm H20   pH, Arterial 7.401 7.350 - 7.450   pCO2 arterial 38.6 32.0 - 48.0 mmHg   pO2, Arterial 124 (H) 83.0 - 108.0 mmHg   Bicarbonate 23.4 20.0 - 28.0 mmol/L   Acid-base deficit 0.7 0.0 - 2.0 mmol/L   O2 Saturation 98.9 %   Patient temperature 99.1  Collection site A-LINE    Drawn by [phone]    Sample type ARTERIAL DRAW   Hemoglobin A1c     Status: Abnormal   Collection Time: 11/19/17  4:37 AM  Result Value Ref Range   Hgb A1c MFr Bld 9.1 (H) 4.8 - 5.6 %    Comment: (NOTE) Pre diabetes:          5.7%-6.4% Diabetes:              >6.4% Glycemic control for   <7.0% adults with diabetes    Mean Plasma Glucose 214.47 mg/dL    Comment: Performed at [facility] [address] [place], [place] [address]  Lipid panel     Status: Abnormal   Collection Time: 11/19/17  4:37 AM  Result Value Ref Range   Cholesterol 110 0 - 200 mg/dL   Triglycerides 340 (H) <150 mg/dL   HDL 19 (L) >40 mg/dL   Total CHOL/HDL Ratio 5.8 RATIO   VLDL 68 (H) 0 - 40 mg/dL   LDL Cholesterol 23 0 - 99 mg/dL    Comment:        Total Cholesterol/HDL:CHD Risk Coronary Heart Disease Risk Table                     Men   Women  1/2  Average Risk   3.4   3.3  Average Risk       5.0   4.4  2 X Average Risk   9.6   7.1  3 X Average Risk  23.4   11.0        Use the calculated Patient Ratio above and the CHD Risk Table to determine the patient's CHD Risk.        ATP III CLASSIFICATION (LDL):  <100     mg/dL   Optimal  100-129  mg/dL   Near or Above                    Optimal  130-159  mg/dL   Borderline  160-189  mg/dL   High  >190     mg/dL   Very High Performed at [facility] [address], [place], [place] [address]   Basic metabolic panel     Status: Abnormal   Collection Time: 11/19/17  4:37 AM  Result Value Ref Range   Sodium 137 135 - 145 mmol/L   Potassium 3.6 3.5 - 5.1 mmol/L   Chloride 107 98 - 111 mmol/L    Comment: Please note change in reference range.   CO2 22 22 - 32 mmol/L   Glucose, Bld 176 (H) 70 - 99 mg/dL    Comment: Please note change in reference range.   BUN 11 8 - 23 mg/dL    Comment: Please note change in reference range.   Creatinine, Ser 0.54 (L) 0.61 - 1.24 mg/dL   Calcium 8.5 (L) 8.9 - 10.3 mg/dL   GFR calc non Af Amer >60 >60 mL/min   GFR calc Af Amer >60 >60 mL/min    Comment: (NOTE) The eGFR has been calculated using the CKD EPI equation. This calculation has not been validated in all clinical situations. eGFR's persistently <60 mL/min signify possible Chronic Kidney Disease.    Anion gap 8 5 - 15    Comment: Performed at [facility] [address] [place], [place] [address]  Magnesium     Status: Abnormal   Collection Time:  11/19/17  4:37 AM  Result Value Ref Range   Magnesium 1.6 (L) 1.7 - 2.4 mg/dL    Comment: Performed at [facility] [address], [place], [place] [address]  Phosphorus     Status: None   Collection Time: 11/19/17  4:37 AM  Result Value Ref Range   Phosphorus 2.9 2.5 - 4.6 mg/dL    Comment: Performed at [facility] [address] [place], [place] [address]  CBC with Differential/Platelet     Status: Abnormal    Collection Time: 11/19/17  4:37 AM  Result Value Ref Range   WBC 13.1 (H) 4.0 - 10.5 K/uL   RBC 3.93 (L) 4.22 - 5.81 MIL/uL   Hemoglobin 11.2 (L) 13.0 - 17.0 g/dL   HCT 35.2 (L) 39.0 - 52.0 %   MCV 89.6 78.0 - 100.0 fL   MCH 28.5 26.0 - 34.0 pg   MCHC 31.8 30.0 - 36.0 g/dL   RDW 14.0 11.5 - 15.5 %   Platelets 273 150 - 400 K/uL   Neutrophils Relative % 83 %   Neutro Abs 10.9 (H) 1.7 - 7.7 K/uL   Lymphocytes Relative 9 %   Lymphs Abs 1.2 0.7 - 4.0 K/uL   Monocytes Relative 6 %   Monocytes Absolute 0.7 0.1 - 1.0 K/uL   Eosinophils Relative 1 %   Eosinophils Absolute 0.2 0.0 - 0.7 K/uL   Basophils Relative 0 %   Basophils Absolute 0.0 0.0 - 0.1 K/uL   Immature Granulocytes 1 %   Abs Immature Granulocytes 0.1 0.0 - 0.1 K/uL    Comment: Performed at [facility], [address], [place], [place] [address]  Glucose, capillary     Status: Abnormal   Collection Time: 11/19/17  5:00 AM  Result Value Ref Range   Glucose-Capillary 169 (H) 70 - 99 mg/dL  Glucose, capillary     Status: Abnormal   Collection Time: 11/19/17  6:13 AM  Result Value Ref Range   Glucose-Capillary 153 (H) 70 - 99 mg/dL  Glucose, capillary     Status: Abnormal   Collection Time: 11/19/17  7:35 AM  Result Value Ref Range   Glucose-Capillary 157 (H) 70 - 99 mg/dL  Glucose, capillary     Status: Abnormal   Collection Time: 11/19/17  8:30 AM  Result Value Ref Range   Glucose-Capillary 158 (H) 70 - 99 mg/dL  Glucose, capillary     Status: Abnormal   Collection Time: 11/19/17  9:36 AM  Result Value Ref Range   Glucose-Capillary 175 (H) 70 - 99 mg/dL  Glucose, capillary     Status: Abnormal   Collection Time: 11/19/17 10:44 AM  Result Value Ref Range   Glucose-Capillary 143 (H) 70 - 99 mg/dL  Glucose, capillary     Status: Abnormal   Collection Time: 11/19/17 11:48 AM  Result Value Ref Range   Glucose-Capillary 158 (H) 70 - 99 mg/dL  Glucose, capillary     Status: Abnormal   Collection Time: 11/19/17  12:51 PM  Result Value Ref Range   Glucose-Capillary 147 (H) 70 - 99 mg/dL  Glucose, capillary     Status: Abnormal   Collection Time: 11/19/17  1:48 PM  Result Value Ref Range   Glucose-Capillary 156 (H) 70 - 99 mg/dL  Glucose, capillary     Status: Abnormal   Collection Time: 11/19/17  3:22 PM  Result Value Ref Range   Glucose-Capillary 147 (H) 70 - 99 mg/dL  Glucose, capillary     Status: Abnormal   Collection Time: 11/19/17  4:21 PM  Result Value Ref Range   Glucose-Capillary 127 (H) 70 - 99 mg/dL  Glucose, capillary     Status: Abnormal   Collection Time: 11/19/17  5:20 PM  Result Value Ref Range   Glucose-Capillary 125 (H) 70 - 99 mg/dL  Glucose, capillary     Status: Abnormal   Collection Time: 11/19/17  6:24 PM  Result Value Ref Range   Glucose-Capillary 178 (H) 70 - 99 mg/dL  Glucose, capillary     Status: Abnormal   Collection Time: 11/19/17  7:27 PM  Result Value Ref Range   Glucose-Capillary 163 (H) 70 - 99 mg/dL  Glucose, capillary     Status: Abnormal   Collection Time: 11/19/17  8:43 PM  Result Value Ref Range   Glucose-Capillary 160 (H) 70 - 99 mg/dL  Glucose, capillary     Status: Abnormal   Collection Time: 11/19/17  9:40 PM  Result Value Ref Range   Glucose-Capillary 143 (H) 70 - 99 mg/dL  Glucose, capillary     Status: Abnormal   Collection Time: 11/19/17 10:46 PM  Result Value Ref Range   Glucose-Capillary 146 (H) 70 - 99 mg/dL  Glucose, capillary     Status: Abnormal   Collection Time: 11/19/17 11:52 PM  Result Value Ref Range   Glucose-Capillary 170 (H) 70 - 99 mg/dL  Glucose, capillary     Status: Abnormal   Collection Time: 11/20/17 12:38 AM  Result Value Ref Range   Glucose-Capillary 158 (H) 70 - 99 mg/dL  Glucose, capillary     Status: Abnormal   Collection Time: 11/20/17  1:44 AM  Result Value Ref Range   Glucose-Capillary 161 (H) 70 - 99 mg/dL  Glucose, capillary     Status: Abnormal   Collection Time: 11/20/17  2:54 AM  Result Value  Ref Range   Glucose-Capillary 154 (H) 70 - 99 mg/dL  Glucose, capillary     Status: Abnormal   Collection Time: 11/20/17  3:57 AM  Result Value Ref Range   Glucose-Capillary 145 (H) 70 - 99 mg/dL  Glucose, capillary     Status: Abnormal   Collection Time: 11/20/17  5:06 AM  Result Value Ref Range   Glucose-Capillary 149 (H) 70 - 99 mg/dL  Glucose, capillary     Status: Abnormal   Collection Time: 11/20/17  6:56 AM  Result Value Ref Range   Glucose-Capillary 133 (H) 70 - 99 mg/dL  Glucose, capillary     Status: Abnormal   Collection Time: 11/20/17  7:57 AM  Result Value Ref Range   Glucose-Capillary 157 (H) 70 - 99 mg/dL  Glucose, capillary     Status: Abnormal   Collection Time: 11/20/17  9:00 AM  Result Value Ref Range   Glucose-Capillary 180 (H) 70 - 99 mg/dL    Ct Angio Head W Or Wo Contrast  Result Date: 11/18/2017 CLINICAL DATA:  Clinical deterioration. Concern regarding basilar thrombosis. EXAM: CT ANGIOGRAPHY HEAD AND NECK TECHNIQUE: Multidetector CT imaging of the head and neck was performed using the standard protocol during bolus administration of intravenous contrast. Multiplanar CT image reconstructions and MIPs were obtained to evaluate the vascular anatomy. Carotid stenosis measurements (when applicable) are obtained utilizing NASCET criteria, using the distal internal carotid diameter as the denominator. CONTRAST:  178m ISOVUE-370 IOPAMIDOL (ISOVUE-370) INJECTION 76% COMPARISON:  Earlier same day. FINDINGS: CT HEAD FINDINGS Brain: No change when compared to the previous  head CT. Generalized atrophy. Chronic small-vessel changes of the pons and hemispheric white matter. No sign of acute infarction by CT. No hemorrhage. No hydrocephalus or extra-axial collection. Vascular: Extensive atherosclerotic calcification of the major vessels at the base the brain. Skull: Negative Sinuses: Clear Orbits: Negative Review of the MIP images confirms the above findings CTA NECK FINDINGS  Aortic arch: Atherosclerotic change without aneurysm or dissection. Right carotid system: Common carotid artery widely patent to the bifurcation region. Advanced calcified and soft plaque at the carotid bifurcation and proximal ICA. Than what diameter of the proximal ICA is 1 mm or less, consistent with 90% or greater stenosis. Beyond that, vessel shows reduced caliber but does show antegrade flow. Left carotid system: Common carotid artery shows some atherosclerotic plaque but no stenosis. At the left carotid bifurcation, there is soft and calcified plaque. At the distal bulb, minimal diameter is 3.5 mm. Compared to a more distal cervical ICA diameter of 5 mm, this indicates a 30% stenosis. Cervical ICA widely patent beyond that. Vertebral arteries: Vertebral artery origins are patent. Vertebral arteries are fairly small vessels but are patent through the cervical region to foramen magnum. Skeleton: No significant bone finding. 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 See prior report. Upper chest: New consolidation in the right upper lobe. Possible small pulmonary nodules. Superior mediastinal adenopathy. See prior report. Review of the MIP images confirms the above findings CTA HEAD FINDINGS Anterior circulation: Both internal carotid arteries are patent through the siphon regions with extensive atherosclerotic calcification. Stenosis in the siphon regions is estimated at 50-70% on each side. Supraclinoid internal carotid arteries are patent. Flow is seen in both anterior and middle cerebral vessels without discernible missing branches. Posterior circulation: Severe calcified atherosclerotic disease of the V4 segments on each side with severe stenoses. Accurate measurement not possible, but stenosis is quite likely approaching 90% on each side. There is a proximal basilar stenosis estimated at 70%. Since the study of  earlier today, there is occlusion of the distal basilar artery proximal to the superior cerebellar and the posterior cerebral arteries. These vessels do show flow, presumably indicating patent posterior communicating vessels. Venous sinuses: Patent and normal. Anatomic variants: None other significant. Delayed phase: No abnormal enhancement. Review of the MIP images confirms the above findings IMPRESSION: Since the study of earlier today, there are only 2 changes. There is new consolidation and volume loss in the right upper lobe. 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 Flow is seen distal to that however, presumably indicating patent posterior communicating arteries. The study was discussed at the time of performance with Dr. Leonel Ramsay. Electronically Signed   By: Nelson Chimes M.D.   On: 11/18/2017 13:41   Ct Head Wo Contrast  Result Date: 11/19/2017 CLINICAL DATA:  Severe stenosis of the distal vertebral arteries. Proximal basilar artery stenosis. Distal basilar artery occlusion. Pontine infarct. EXAM: CT HEAD WITHOUT CONTRAST TECHNIQUE: Contiguous axial images were obtained from the base of the skull through the vertex without intravenous contrast. COMPARISON:  MRI brain 11/19/2017 12:49 a.m.  Are FINDINGS: Brain: Hypoattenuating area associated with the pontine infarct is again seen. There is no interval hemorrhage. Brainstem and cerebellum are otherwise normal. No supratentorial infarct is present. No acute hemorrhage or mass lesion is present. Vascular: Left distal vertebral artery stent extends into the basilar artery. Atherosclerotic calcifications are present at the dural margin of both vertebral arteries and within  the cavernous internal carotid arteries. There is no hyperdense vessel. Skull: Calvarium is intact. No focal lytic or blastic lesions are present. Sinuses/Orbits: Diffuse mucosal thickening is present throughout the ethmoid air cells. Fluid levels are present in the sphenoid  sinuses, frontal sinuses, and maxillary sinuses. The mastoid air cells are clear. Bilateral exophthalmos is stable. Globes and orbits are otherwise within normal limits. IMPRESSION: 1. Expected evolution of brainstem infarct without hemorrhage. 2. No new infarcts. 3. Left vertebral artery and basilar artery stent. 4. Acute on chronic sinus disease may be related to intubation. Electronically Signed   By: San Morelle M.D.   On: 11/19/2017 11:40   Ct Angio Neck W Or Wo Contrast  Result Date: 11/18/2017 CLINICAL DATA:  Clinical deterioration. Concern regarding basilar thrombosis. EXAM: CT ANGIOGRAPHY HEAD AND NECK TECHNIQUE: Multidetector CT imaging of the head and neck was performed using the standard protocol during bolus administration of intravenous contrast. Multiplanar CT image reconstructions and MIPs were obtained to evaluate the vascular anatomy. Carotid stenosis measurements (when applicable) are obtained utilizing NASCET criteria, using the distal internal carotid diameter as the denominator. CONTRAST:  128m ISOVUE-370 IOPAMIDOL (ISOVUE-370) INJECTION 76% COMPARISON:  Earlier same day. FINDINGS: CT HEAD FINDINGS Brain: No change when compared to the previous head CT. Generalized atrophy. Chronic small-vessel changes of the pons and hemispheric white matter. No sign of acute infarction by CT. No hemorrhage. No hydrocephalus or extra-axial collection. Vascular: Extensive atherosclerotic calcification of the major vessels at the base the brain. Skull: Negative Sinuses: Clear Orbits: Negative Review of the MIP images confirms the above findings CTA NECK FINDINGS Aortic arch: Atherosclerotic change without aneurysm or dissection. Right carotid system: Common carotid artery widely patent to the bifurcation region. Advanced calcified and soft plaque at the carotid bifurcation and proximal ICA. Than what diameter of the proximal ICA is 1 mm or less, consistent with 90% or greater stenosis. Beyond that,  vessel shows reduced caliber but does show antegrade flow. Left carotid system: Common carotid artery shows some atherosclerotic plaque but no stenosis. At the left carotid bifurcation, there is soft and calcified plaque. At the distal bulb, minimal diameter is 3.5 mm. Compared to a more distal cervical ICA diameter of 5 mm, this indicates a 30% stenosis. Cervical ICA widely patent beyond that. Vertebral arteries: Vertebral artery origins are patent. Vertebral arteries are fairly small vessels but are patent through the cervical region to foramen magnum. Skeleton: No significant bone finding. 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 See prior report. Upper chest: New consolidation in the right upper lobe. Possible small pulmonary nodules. Superior mediastinal adenopathy. See prior report. Review of the MIP images confirms the above findings CTA HEAD FINDINGS Anterior circulation: Both internal carotid arteries are patent through the siphon regions with extensive atherosclerotic calcification. Stenosis in the siphon regions is estimated at 50-70% on each side. Supraclinoid internal carotid arteries are patent. Flow is seen in both anterior and middle cerebral vessels without discernible missing branches. Posterior circulation: Severe calcified atherosclerotic disease of the V4 segments on each side with severe stenoses. Accurate measurement not possible, but stenosis is quite likely approaching 90% on each side. There is a proximal basilar stenosis estimated at 70%. Since the study of earlier today, there is occlusion of the distal basilar artery proximal to the superior cerebellar and the posterior cerebral arteries. These vessels do show flow, presumably indicating patent posterior communicating vessels. Venous  sinuses: Patent and normal. Anatomic variants: None other significant. Delayed phase: No abnormal enhancement.  Review of the MIP images confirms the above findings IMPRESSION: Since the study of earlier today, there are only 2 changes. There is new consolidation and volume loss in the right upper lobe. 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 Flow is seen distal to that however, presumably indicating patent posterior communicating arteries. The study was discussed at the time of performance with Dr. Leonel Ramsay. Electronically Signed   By: Nelson Chimes M.D.   On: 11/18/2017 13:41   Ct Chest W Contrast  Result Date: 11/18/2017 CLINICAL DATA:  Unable to follow commands. EXAM: CT CHEST WITH CONTRAST TECHNIQUE: Multidetector CT imaging of the chest was performed during intravenous contrast administration. CONTRAST:  11m ISOVUE-370 IOPAMIDOL (ISOVUE-370) INJECTION 76% COMPARISON:  Radiograph of same day.  CT scan of August 23, 2016. FINDINGS: Cardiovascular: Atherosclerosis of thoracic aorta is noted without aneurysm formation. Coronary artery calcifications are noted. No pericardial effusion is noted. Mild cardiomegaly is noted. Mediastinum/Nodes: Endotracheal and nasogastric tubes are in grossly good position. Stable 1 cm lymph node seen in aortopulmonary window. Stable 1.3 cm right paratracheal lymph node. Stable 1 cm right subcarinal lymph node. Bilateral calcified thyroid nodules are again noted. The largest measures 2.8 cm on the left. Lungs/Pleura: No pneumothorax or significant pleural effusion is noted. Right upper lobe airspace opacity with air bronchograms is noted concerning for pneumonia or atelectasis. Minimal bibasilar subsegmental atelectasis is noted. Stable 8 mm nodule seen in lingular segment of left upper lobe. Upper Abdomen: No acute abnormality. Musculoskeletal: No chest wall abnormality. No acute or significant osseous findings. IMPRESSION: Right upper lobe airspace opacity is noted with air bronchograms concerning for pneumonia or possibly atelectasis. Stable mediastinal adenopathy is noted  compared to prior exam. Endotracheal and nasogastric tubes are in grossly good position. Coronary artery calcifications are noted suggesting coronary artery disease. Possible 2.8 cm left thyroid nodule. Thyroid ultrasound is recommended for further evaluation. Aortic Atherosclerosis (ICD10-I70.0). Electronically Signed   By: JMarijo Conception M.D.   On: 11/18/2017 13:47   Mr Brain Wo Contrast  Result Date: 11/19/2017 CLINICAL DATA:  62y/o M; findings of stroke with progression of symptoms post basilar intervention. EXAM: MRI HEAD WITHOUT CONTRAST TECHNIQUE: Multiplanar, multiecho pulse sequences of the brain and surrounding structures were obtained without intravenous contrast. COMPARISON:  11/18/2017 CTA and CT of the head. 11/18/2017 cerebral angiography. FINDINGS: Brain: Reduced diffusion compatible with acute/early subacute infarction within the central pons measuring 2.4 x 2.6 x 1.1 cm (volume = 3.6 cm^3)(AP x ML x CC series 5, image 58 and series 7, image 56). The region of reduced diffusion is involves both sides of the pons, greater on the right. There additional punctate foci of infarction within the left cerebellar hemisphere (series 5, image 53) and the right parietal cortex (series 5, image 77). No associated hemorrhage or significant mass effect. Minimal chronic microvascular ischemic changes of the brain and mild brain parenchymal volume loss. Single punctate focus of susceptibility hypointensity within the right cerebellar hemisphere compatible with hemosiderin deposition of chronic microhemorrhage. No extra-axial collection, hydrocephalus, or herniation. Vascular: Normal flow voids. Skull and upper cervical spine: Normal marrow signal. Sinuses/Orbits: Diffuse paranasal sinus mucosal thickening and fluid levels likely due to intubation. Orbits are unremarkable. Other: None. IMPRESSION: 1. Acute/early subacute infarction within the central pons involving both sides, greater on the right, measuring  up to 2.6 cm, 3.6 cc. No associated hemorrhage or significant mass effect. 2.  Additional punctate acute/early subacute infarctions within left lateral cerebellum and right parietal cortex. 3. Minimal chronic microvascular ischemic changes of the brain and mild parenchymal volume loss. These results will be called to the ordering clinician or representative by the Radiologist Assistant, and communication documented in the PACS or zVision Dashboard. Electronically Signed   By: Kristine Garbe M.D.   On: 11/19/2017 01:37   Dg Chest Port 1 View  Result Date: 11/19/2017 CLINICAL DATA:  Evaluate endotracheal tube.  Stroke. EXAM: PORTABLE CHEST 1 VIEW COMPARISON:  Chest radiograph 11/18/2017 FINDINGS: Endotracheal tube is at the level of the clavicular heads and approximately 6.5 cm above the carina. Streaky densities in the lower chest most likely associated with atelectasis. Overall, slightly improved aeration in the lungs. Heart size is upper limits of normal. Nasogastric tube extends into the abdomen. Negative for a pneumothorax. IMPRESSION: Stable appearance of the endotracheal tube. Nasogastric tube extends into the abdomen. Slightly improved aeration in lungs may represent decreasing atelectasis or edema. Electronically Signed   By: Markus Daft M.D.   On: 11/19/2017 08:59   Dg Chest Port 1 View  Result Date: 11/18/2017 CLINICAL DATA:  Stroke symptoms. EXAM: PORTABLE CHEST 1 VIEW COMPARISON:  01/15/2017. FINDINGS: Endotracheal tube noted with tip 5 cm above the carina. NG tube noted with tip below left hemidiaphragm. Cardiomegaly with pulmonary vascular congestion and bilateral interstitial prominence suggesting CHF. No pleural effusion or pneumothorax. IMPRESSION: 1. Endotracheal tube tip noted 5 cm above the carina. NG tube tip noted below left hemidiaphragm. 2. Cardiomegaly with pulmonary venous congestion bilateral interstitial prominence suggesting mild CHF. Electronically Signed   By: Marcello Moores   Register   On: 11/18/2017 11:20    Review of Systems  Unable to perform ROS: Intubated   Blood pressure 126/61, pulse 90, temperature (!) 101.1 F (38.4 C), temperature source Axillary, resp. rate 19, height 5' 10"  (1.778 m), weight (!) 312 lb 2.7 oz (141.6 kg), SpO2 97 %. Physical Exam  Constitutional: He appears well-developed and well-nourished. No distress.  Intubated, sedated.  HENT:  Head: Normocephalic and atraumatic.  Right Ear: External ear normal.  Left Ear: External ear normal.  Nose: Nose normal.  Neck: Normal range of motion. Neck supple.  Cardiovascular: Normal rate.  Respiratory:  Mechanical ventilation.  Neurological:  Sedated.  Skin: Skin is warm and dry.  Psychiatric:  Sedated.    Assessment/Plan: Thyroid mass, hemoptysis, mediastinal adenopathy, tracheal narrowing  I personally reviewed his recent chest CT imaging and previous imaging from April 2018 demonstrating bilateral thyroid nodules, largest on the left measuring 2.8 cm, and multiple scattered enlarged mediastinal nodes.  The trachea is deviated to the right and narrowed.  There appears to be some increase in size of the left thyroid mass compared to April 2018 and this may be the site of tracheal erosion.  I discussed the patient with CCM.  Currently, the patient has no cuff leak.  I recommend leaving him intubated while the problem is evaluated further.  He may require tracheostomy.  I recommend proceeding with thyroid ultrasound and ultrasound-guided FNA, if possible, of the left thyroid mass.  Shirelle Tootle 11/20/2017, 10:42 AM

## 2017-11-20 NOTE — Progress Notes (Signed)
PT Cancellation Note  Patient Details Name: [name] [name] MRN: [id number] DOB: 1955-07-27   Cancelled Treatment:     Patient remains intubated and sedated at this time, no appropriate for therapies. Will follow   Duncan Dull 11/20/2017, 8:25 AM

## 2017-11-20 NOTE — Progress Notes (Signed)
  Echocardiogram 2D Echocardiogram has been performed.  Devion Chriscoe T Kristoff Coonradt 11/20/2017, 12:32 PM

## 2017-11-20 NOTE — Progress Notes (Signed)
Pt temp 101.4 Axillary after per tube tylenol Neuro MD made aware. Asked RN to speak to CCM for recommendations. IV tylenol ordered. Will add ice packs.

## 2017-11-20 NOTE — Progress Notes (Signed)
SLP Cancellation Note  Patient Details Name: [name] [name] MRN: [id number] DOB: Apr 02, 1956   Cancelled treatment:       Reason Eval/Treat Not Completed: Patient not medically ready  Deneise Lever, [place], [place] Speech-Language Pathologist [phone]   Aliene Altes 11/20/2017, 11:26 AM

## 2017-11-20 NOTE — Progress Notes (Signed)
STROKE TEAM PROGRESS NOTE   SUBJECTIVE (INTERVAL HISTORY) His family including his brothers, nephews, girlfriend are at bedside. Sedation off for a couple of hours and pt is now awake alert and following simple commands on right hand and foot. BP stable at 120s, HR 90s not in afib. No significant bleeding from ET tube, feet normal color bilaterally. Still has left hemiplegia and right proximal significant weakness.   OBJECTIVE Temp:  [98.4 F (36.9 C)-101.1 F (38.4 C)] 101.1 F (38.4 C) (06/29 0800) Pulse Rate:  [67-155] 96 (06/29 0748) Cardiac Rhythm: Normal sinus rhythm (06/29 0642) Resp:  [0-29] 26 (06/29 0700) BP: (97-142)/(47-75) 107/50 (06/29 0700) SpO2:  [93 %-100 %] 96 % (06/29 0748) Arterial Line BP: (114-169)/(62-81) 152/65 (06/29 0700) FiO2 (%):  [40 %] 40 % (06/29 0748)  CBC:  Recent Labs  Lab 11/18/17 0928  11/18/17 1755 11/19/17 0437  WBC 9.3  --   --  13.1*  NEUTROABS 5.6  --   --  10.9*  HGB 13.6   < > 11.5* 11.2*  HCT 42.5   < > 36.2* 35.2*  MCV 89.1  --   --  89.6  PLT 276  --   --  273   < > = values in this interval not displayed.    Basic Metabolic Panel:  Recent Labs  Lab 11/18/17 0928 11/18/17 0933  11/18/17 1648 11/19/17 0437  NA 134* 135   < > 138 137  K 4.3 4.2   < > 4.0 3.6  CL 99 100  --   --  107  CO2 23  --   --   --  22  GLUCOSE 377* 381*   < > 258* 176*  BUN 13 14  --   --  11  CREATININE 0.69 0.50*  --   --  0.54*  CALCIUM 9.8  --   --   --  8.5*  MG  --   --   --   --  1.6*  PHOS  --   --   --   --  2.9   < > = values in this interval not displayed.    Lipid Panel:     Component Value Date/Time   CHOL 110 11/19/2017 0437   CHOL 119 08/09/2017 1000   TRIG 340 (H) 11/19/2017 0437   HDL 19 (L) 11/19/2017 0437   HDL 29 (L) 08/09/2017 1000   CHOLHDL 5.8 11/19/2017 0437   VLDL 68 (H) 11/19/2017 0437   LDLCALC 23 11/19/2017 0437   LDLCALC 30 08/09/2017 1000   HgbA1c:  Lab Results  Component Value Date   HGBA1C 9.1 (H)  11/19/2017   Urine Drug Screen: No results found for: LABOPIA, COCAINSCRNUR, LABBENZ, AMPHETMU, THCU, LABBARB  Alcohol Level No results found for: [place] I have personally reviewed the radiological images below and agree with the radiology interpretations.  Ct Angio Head W Or Wo Contrast  Ct Angio Neck W Or Wo Contrast 11/18/2017 IMPRESSION:  Since the study of earlier today, there are only 2 changes. There is new consolidation and volume loss in the right upper lobe. 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 Flow is seen distal to that however, presumably indicating patent posterior communicating arteries.   Ct Angio Head W Or Wo Contrast Ct Angio Neck W Or Wo Contrast 11/18/2017 IMPRESSION:  1. Severe intracranial and extracranial atherosclerotic disease. No emergent large vessel occlusion  2. Severe stenosis right carotid bifurcation due to calcified plaque,  estimated 90% diameter stenosis. No significant left carotid stenosis. Severe stenosis cavernous carotid bilaterally.  3. Both vertebral arteries are patent in the neck with severe calcific stenosis V4 segment bilaterally.  4. 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 Small calcified lymph nodes left neck suspicious for metastatic disease  5. Small lung nodules on the right, possible metastatic disease. Recommend chest CT for further evaluation.   Ct Head Wo Contrast 11/19/2017 IMPRESSION:  1. Expected evolution of brainstem infarct without hemorrhage.  2. No new infarcts.  3. Left vertebral artery and basilar artery stent.  4. Acute on chronic sinus disease may be related to intubation.   Ct Chest W Contrast 11/18/2017 IMPRESSION Right upper lobe airspace opacity is noted with air bronchograms concerning for pneumonia or possibly atelectasis. Stable mediastinal adenopathy is noted compared to prior exam. Endotracheal and nasogastric tubes are in grossly good position. Coronary artery  calcifications are noted suggesting coronary artery disease. Possible 2.8 cm left thyroid nodule. Thyroid ultrasound is recommended for further evaluation. Aortic Atherosclerosis (ICD10-I70.0).   Mr Brain Wo Contrast 11/19/2017 IMPRESSION:  1. 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 No associated hemorrhage or significant mass effect.  2. Additional punctate acute/early subacute infarctions within left lateral cerebellum and right parietal cortex.  3. Minimal chronic microvascular ischemic changes of the brain and mild parenchymal volume loss.   Ct Head Code Stroke Wo Contrast 11/18/2017 IMPRESSION:  1. No acute intracranial abnormality.  Atherosclerotic calcification  2. ASPECTS is 10   Cerebral Angiogram / Stent  11/18/2017 1. 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 Occluded non dominant RT VBJ. 2. S/P endovascular revascularization of the distal basilar artery  To 90 % patency , and of 30 to 40 % of the LT VBJ with stent placement. Dominant  Lt Pcom.  Transthoracic Echocardiogram - pending    PHYSICAL EXAM  Temp:  [98.4 F (36.9 C)-101.1 F (38.4 C)] 101.1 F (38.4 C) (06/29 0800) Pulse Rate:  [67-140] 90 (06/29 1000) Resp:  [0-29] 19 (06/29 1000) BP: (97-142)/(47-75) 126/61 (06/29 0800) SpO2:  [93 %-100 %] 97 % (06/29 1000) Arterial Line BP: (114-169)/(59-81) 150/64 (06/29 1000) FiO2 (%):  [40 %] 40 % (06/29 0748)  General - morbid obesity, well developed, intubated.  Ophthalmologic - fundi not visualized due to noncooperation.  Cardiovascular - Regular rate and rhythm, not in afib.  Neuro - awake alert, intubated but off sedation, following simple commands on the right hand and foot. PERRL, upward gaze with nystagmus, downward gaze symmetrical, left gaze symmetrical but right gaze palsy on the right. Blinking to  visual threat bilaterally. Facial symmetry not able to test due to ET tube, not able to move tongue or mouth on command. Not able to frown. Very weak corneal reflexes bilaterally, but positive gag and cough. R hand 4/5 finger movement, right foot 3/5 DF and PF, however, right UE and LE proximal 1/5 with pain. Left UE and LE 0/5 with pain, DTR diminished and no babinski. Sensation, coordination and gait not tested.    ASSESSMENT/PLAN Mr. Calton Harshfield is a 62 y.o. male with PMH of DM, hemoptysis, OSA, HTN, HLD, CAD, afib off Eliquis due to hemoptysis presenting with slurred speech, right sided weakness. Was intubated for hemoptysis prevention while giving tPA. Symptoms worsened and imaging confirmed BA occlusion. S/p IR with  revascularization of BA as well as left VA s/p stenting.  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   MRI head - large acute/early subacute infarction within the central pons involving both sides, greater on the right.  CTA H&N - severe vascular athero including BA stenosis, b/l VA stenosis, right ICA proximal 90% stenosis, b/l cavernous ICA stenosis R>L.  Repeat CTA H/N - new distal BA occlusion  DSA - revascularization of the distal basilar artery  To 90 % patency , and of 30 to 40 % of the LT VBJ with stent placement.  2D Echo - pending  LDL - 23  HgbA1c - 9.1  VTE prophylaxis - SCDs  Eliquis (recently stopped) and plavix prior to admission, now on aspirin 81 mg daily and Brilinta . Hold off AC for now due to hemoptysis and large pontine stroke.   Ongoing  aggressive stroke risk factor management  Therapy recommendations:  pending  Disposition:  Pending  Thyroid mass  CTA neck - right large thyroid mass compressing on trachea.   Thyroid mass larger than CT chest in 08/2016  Evidence of tracheal erosion on bronch at this admission  Likely need thyroid US, biopsy, and oncology consult once stable  Respiratory failure  CCM on board  Current intubated off sedation  Extubate as able  Consider ENT consult for tracheal erosion from right thyroid  PAF  Currently on in afib  On eliquis in the past but off due to hemoptysis  Hold off at this time due to hemoptysis and large pontine stroke  On ASA and brilinta  Rate controlled currently  Cerebral vascular athero  CTA head and neck showed right ICA proximal 90% stenosis, left ICA proximal athero, b/l cavernous ICA severe athro R>L, BA severe stenosis, b/l V4 severe athero with right V4 occlusion  S/p left V4 stent and BA revascularization  On ASA and brilinta  Hemoptysis   Since 08/2016 as per family  Not sure about etiology  TB ruled out in the past  Tracheal erosion from right thyroid mass likely not the only explanation   CT chest showed scattered long nodules, RUE suspected PNA  On IV unasyn  CCM on board  Hypertension  Stable . Post procedure BP goal 120-150, now will relax to BP goal < 180  . Off cardene this time  Hyperlipidemia  Lipid lowering medication PTA: Crestor 10 mg daily  LDL 23, goal < 70  resumed Crestor  Continue statin at discharge  Diabetes  HgbA1c 9.1, goal < 7.0  Uncontrolled  On insulin drip, CBG stable   Also on D5  CBG monitoring  CCM on board  Other Stroke Risk Factors  Advanced age  Former cigarette smoker - quit, 40 pack year history  Morbid obesity, Body mass index is 44.79 kg/m.    Hx stroke/TIA  Coronary artery disease   PAD with claudication  Obstructive sleep apnea  OSA on CPAP  Other  Active Problems  Mild leukocytosis   Hospital day # 2  This patient is critically ill due to stroke, PAF, thyroid mass, hemoptysis, severe vascular athero and at significant risk of neurological  worsening, death form recurrent stroke, hemorrhagic conversion, heart failure, bleeding, shock. This patient's care requires constant monitoring of vital signs, hemodynamics, respiratory and cardiac monitoring, review of multiple databases, neurological assessment, discussion with family, other specialists and medical decision making of high complexity. I spent 45 minutes of neurocritical care time in the care of this patient. I had long discussion with multiple family members at bedside, updated pt current condition, treatment plan and potential prognosis. They expressed understanding and appreciation. I also discussed with Dr. Lorella Nimrod with CCM.  Rosalin Hawking, MD PhD Stroke Neurology 11/20/2017 10:44 AM   To contact Stroke Continuity provider, please refer to [email or web address] After hours, contact General Neurology

## 2017-11-21 ENCOUNTER — Inpatient Hospital Stay (HOSPITAL_COMMUNITY): Payer: 59

## 2017-11-21 DIAGNOSIS — E079 Disorder of thyroid, unspecified: Secondary | ICD-10-CM | POA: Diagnosis present

## 2017-11-21 LAB — BASIC METABOLIC PANEL
ANION GAP: 5 (ref 5–15)
BUN: 12 mg/dL (ref 8–23)
CHLORIDE: 110 mmol/L (ref 98–111)
CO2: 23 mmol/L (ref 22–32)
Calcium: 8 mg/dL — ABNORMAL LOW (ref 8.9–10.3)
Creatinine, Ser: 0.48 mg/dL — ABNORMAL LOW (ref 0.61–1.24)
GFR calc Af Amer: >60 mL/min (ref >60)
GFR calc non Af Amer: >60 mL/min (ref >60)
GLUCOSE: 166 mg/dL — AB (ref 70–99)
Potassium: 3.6 mmol/L (ref 3.5–5.1)
Sodium: 138 mmol/L (ref 135–145)

## 2017-11-21 LAB — GLUCOSE, CAPILLARY
GLUCOSE-CAPILLARY: 130 mg/dL — AB (ref 70–99)
GLUCOSE-CAPILLARY: 147 mg/dL — AB (ref 70–99)
GLUCOSE-CAPILLARY: 162 mg/dL — AB (ref 70–99)
GLUCOSE-CAPILLARY: 166 mg/dL — AB (ref 70–99)
GLUCOSE-CAPILLARY: 170 mg/dL — AB (ref 70–99)
GLUCOSE-CAPILLARY: 171 mg/dL — AB (ref 70–99)
GLUCOSE-CAPILLARY: 185 mg/dL — AB (ref 70–99)
GLUCOSE-CAPILLARY: 190 mg/dL — AB (ref 70–99)
Glucose-Capillary: 149 mg/dL — ABNORMAL HIGH (ref 70–99)
Glucose-Capillary: 170 mg/dL — ABNORMAL HIGH (ref 70–99)
Glucose-Capillary: 197 mg/dL — ABNORMAL HIGH (ref 70–99)
Glucose-Capillary: 192 mg/dL — ABNORMAL HIGH (ref 70–99)
Glucose-Capillary: 168 mg/dL — ABNORMAL HIGH (ref 70–99)
Glucose-Capillary: 154 mg/dL — ABNORMAL HIGH (ref 70–99)
Glucose-Capillary: 152 mg/dL — ABNORMAL HIGH (ref 70–99)
Glucose-Capillary: 183 mg/dL — ABNORMAL HIGH (ref 70–99)
Glucose-Capillary: 168 mg/dL — ABNORMAL HIGH (ref 70–99)
Glucose-Capillary: 165 mg/dL — ABNORMAL HIGH (ref 70–99)
Glucose-Capillary: 143 mg/dL — ABNORMAL HIGH (ref 70–99)
Glucose-Capillary: 165 mg/dL — ABNORMAL HIGH (ref 70–99)
Glucose-Capillary: 174 mg/dL — ABNORMAL HIGH (ref 70–99)
Glucose-Capillary: 157 mg/dL — ABNORMAL HIGH (ref 70–99)
Glucose-Capillary: 150 mg/dL — ABNORMAL HIGH (ref 70–99)
Glucose-Capillary: 157 mg/dL — ABNORMAL HIGH (ref 70–99)

## 2017-11-21 LAB — CBC
HEMATOCRIT: 33.5 % — AB (ref 39.0–52.0)
HEMOGLOBIN: 10.2 g/dL — AB (ref 13.0–17.0)
MCH: 27.9 pg (ref 26.0–34.0)
MCHC: 30.4 g/dL (ref 30.0–36.0)
MCV: 91.8 fL (ref 78.0–100.0)
Platelets: 199 10*3/uL (ref 150–400)
RBC: 3.65 MIL/uL — ABNORMAL LOW (ref 4.22–5.81)
RDW: 14.1 % (ref 11.5–15.5)
WBC: 9.6 10*3/uL (ref 4.0–10.5)

## 2017-11-21 LAB — POCT I-STAT 3, ART BLOOD GAS (G3+)
Acid-Base Excess: 1 mmol/L (ref 0.0–2.0)
Bicarbonate: 25.2 mmol/L (ref 20.0–28.0)
O2 Saturation: 99 %
Patient temperature: 99.1
TCO2: 26 mmol/L (ref 22–32)
pCO2 arterial: 38 mmHg (ref 32.0–48.0)
pH, Arterial: 7.431 (ref 7.350–7.450)
pO2, Arterial: 130 mmHg — ABNORMAL HIGH (ref 83.0–108.0)

## 2017-11-21 LAB — MAGNESIUM: Magnesium: 2.2 mg/dL (ref 1.7–2.4)

## 2017-11-21 LAB — PHOSPHORUS: Phosphorus: 2.8 mg/dL (ref 2.5–4.6)

## 2017-11-21 MED ORDER — METOPROLOL TARTRATE 5 MG/5ML IV SOLN
5.0000 mg | INTRAVENOUS | Status: DC | PRN
  Administered 2017-11-21 – 2017-12-15 (×28): 5 mg via INTRAVENOUS
  Filled 2017-11-21 (×31): qty 5

## 2017-11-21 MED ORDER — METOPROLOL TARTRATE 5 MG/5ML IV SOLN
INTRAVENOUS | Status: AC
  Filled 2017-11-21: qty 5

## 2017-11-21 MED ORDER — MIDAZOLAM HCL 2 MG/2ML IJ SOLN
2.0000 mg | INTRAMUSCULAR | Status: DC | PRN
  Administered 2017-11-22 – 2017-12-10 (×23): 2 mg via INTRAVENOUS
  Filled 2017-11-21 (×27): qty 2

## 2017-11-21 MED ORDER — IPRATROPIUM-ALBUTEROL 0.5-2.5 (3) MG/3ML IN SOLN
3.0000 mL | RESPIRATORY_TRACT | Status: DC
  Administered 2017-11-21 – 2017-12-03 (×72): 3 mL via RESPIRATORY_TRACT
  Filled 2017-11-21 (×72): qty 3

## 2017-11-21 MED ORDER — MIDAZOLAM HCL 2 MG/2ML IJ SOLN
2.0000 mg | INTRAMUSCULAR | Status: AC | PRN
  Administered 2017-11-22 (×3): 2 mg via INTRAVENOUS
  Filled 2017-11-21 (×2): qty 2

## 2017-11-21 MED ORDER — FENTANYL CITRATE (PF) 100 MCG/2ML IJ SOLN: 25.0000 ug | INTRAMUSCULAR | Status: DC | PRN

## 2017-11-21 MED ORDER — FENTANYL CITRATE (PF) 100 MCG/2ML IJ SOLN
25.0000 ug | INTRAMUSCULAR | Status: DC | PRN
  Administered 2017-11-21 (×2): 75 ug via INTRAVENOUS
  Administered 2017-11-21: 50 ug via INTRAVENOUS
  Filled 2017-11-21 (×3): qty 2

## 2017-11-21 NOTE — Progress Notes (Signed)
Order to advance ETT 2cm, tube was at 24.5 and advance to 26 at the teeth where it had been previously documented.  Pt coughed profusely for about 3-4 min but settled back down.  Pt suctioned after ETT was advanced and was noted to have frank bright red blood via inline suction catheter.  RN notified.  BBS heard on auscultation and pt receiving volumes fine.  Vital signs stable, RT will continue to monitor.

## 2017-11-21 NOTE — Progress Notes (Addendum)
eLink Physician-Brief Progress Note Patient Name: [name] [name] DOB: 09-Dec-1955 MRN: [id number]   Date of Service  11/21/2017  HPI/Events of Note  Bleeding from ETT post suctioning. Patient on Brilinta and ASA. Last Hgb = 10.2 and Platelet count = 199K.  eICU Interventions  Will order: 1. CBC now. 2. PT/INR and PTT now.  3. Suction with red rubber catheters.  4. Metoprolol 5 mg IV Q 3 hours PRN HR > 115.  5. D/C Labetalol IV PRN.         [name] [name] Cornelia Copa 11/21/2017, 10:57 PM

## 2017-11-21 NOTE — Progress Notes (Signed)
STROKE TEAM PROGRESS NOTE   SUBJECTIVE (INTERVAL HISTORY) His girlfriend is at bedside. Pt neuro stable, awake alert and following commands on the right. On weaning trial, doing well. No cuff leak, so likely need trach for airway protection. IR recommend thyroid biopsy as outpt once stable. Pt had fever overnight, on ice packs. BP on the low side, currently on metoprolol IV bid.   OBJECTIVE Temp:  [99.9 F (37.7 C)-101.4 F (38.6 C)] 99.9 F (37.7 C) (06/30 0400) Pulse Rate:  [55-103] 55 (06/30 0700) Cardiac Rhythm: Normal sinus rhythm (06/30 0000) Resp:  [0-41] 26 (06/30 0700) BP: (98-160)/(49-72) 98/52 (06/30 0700) SpO2:  [96 %-100 %] 98 % (06/30 0700) Arterial Line BP: (92-170)/(59-96) 92/67 (06/30 0700) FiO2 (%):  [40 %] 40 % (06/30 0348) Weight:  [332 lb 3.7 oz (150.7 kg)] 332 lb 3.7 oz (150.7 kg) (06/30 0500)  CBC:  Recent Labs  Lab 11/18/17 0928  11/18/17 1755 11/19/17 0437  WBC 9.3  --   --  13.1*  NEUTROABS 5.6  --   --  10.9*  HGB 13.6   < > 11.5* 11.2*  HCT 42.5   < > 36.2* 35.2*  MCV 89.1  --   --  89.6  PLT 276  --   --  273   < > = values in this interval not displayed.    Basic Metabolic Panel:  Recent Labs  Lab 11/18/17 0928 11/18/17 0933  11/18/17 1648  11/19/17 0437 11/20/17 1633 11/21/17 0500  NA 134* 135   < > 138  --  137  --   --   K 4.3 4.2   < > 4.0  --  3.6  --   --   CL 99 100  --   --   --  107  --   --   CO2 23  --   --   --   --  22  --   --   GLUCOSE 377* 381*   < > 258*  --  176*  --   --   BUN 13 14  --   --   --  11  --   --   CREATININE 0.69 0.50*  --   --   --  0.54*  --   --   CALCIUM 9.8  --   --   --   --  8.5*  --   --   MG  --   --   --   --    < > 1.6* 2.2 2.2  PHOS  --   --   --   --    < > 2.9 3.0 2.8   < > = values in this interval not displayed.    Lipid Panel:     Component Value Date/Time   CHOL 110 11/19/2017 0437   CHOL 119 08/09/2017 1000   TRIG 340 (H) 11/19/2017 0437   HDL 19 (L) 11/19/2017 0437   HDL  29 (L) 08/09/2017 1000   CHOLHDL 5.8 11/19/2017 0437   VLDL 68 (H) 11/19/2017 0437   LDLCALC 23 11/19/2017 0437   LDLCALC 30 08/09/2017 1000   HgbA1c:  Lab Results  Component Value Date   HGBA1C 9.1 (H) 11/19/2017   Urine Drug Screen: No results found for: LABOPIA, COCAINSCRNUR, LABBENZ, AMPHETMU, THCU, LABBARB  Alcohol Level No results found for: [place] I have personally reviewed the radiological images below and agree with the radiology interpretations.  Ct Angio  Head W Or Wo Contrast  Ct Angio Neck W Or Wo Contrast 11/18/2017 IMPRESSION:  Since the study of earlier today, there are only 2 changes. There is new consolidation and volume loss in the right upper lobe. 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 Flow is seen distal to that however, presumably indicating patent posterior communicating arteries.   Ct Angio Head W Or Wo Contrast Ct Angio Neck W Or Wo Contrast 11/18/2017 IMPRESSION:  1. Severe intracranial and extracranial atherosclerotic disease. No emergent large vessel occlusion  2. Severe stenosis right carotid bifurcation due to calcified plaque, estimated 90% diameter stenosis. No significant left carotid stenosis. Severe stenosis cavernous carotid bilaterally.  3. Both vertebral arteries are patent in the neck with severe calcific stenosis V4 segment bilaterally.  4. 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 Small calcified lymph nodes left neck suspicious for metastatic disease  5. Small lung nodules on the right, possible metastatic disease. Recommend chest CT for further evaluation.   Ct Head Wo Contrast 11/19/2017 IMPRESSION:  1. Expected evolution of brainstem infarct without hemorrhage.  2. No new infarcts.  3. Left vertebral artery and basilar artery stent.  4. Acute on chronic sinus disease may be related to intubation.   Ct Chest W Contrast 11/18/2017 IMPRESSION Right upper lobe airspace opacity is noted with air  bronchograms concerning for pneumonia or possibly atelectasis. Stable mediastinal adenopathy is noted compared to prior exam. Endotracheal and nasogastric tubes are in grossly good position. Coronary artery calcifications are noted suggesting coronary artery disease. Possible 2.8 cm left thyroid nodule. Thyroid ultrasound is recommended for further evaluation. Aortic Atherosclerosis (ICD10-I70.0).   Mr Brain Wo Contrast 11/19/2017 IMPRESSION:  1. 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 No associated hemorrhage or significant mass effect.  2. Additional punctate acute/early subacute infarctions within left lateral cerebellum and right parietal cortex.  3. Minimal chronic microvascular ischemic changes of the brain and mild parenchymal volume loss.   Ct Head Code Stroke Wo Contrast 11/18/2017 IMPRESSION:  1. No acute intracranial abnormality.  Atherosclerotic calcification  2. ASPECTS is 10   Cerebral Angiogram / Stent  11/18/2017 1. 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 Occluded non dominant RT VBJ. 2. S/P endovascular revascularization of the distal basilar artery  To 90 % patency , and of 30 to 40 % of the LT VBJ with stent placement. Dominant  Lt Pcom.  Transthoracic Echocardiogram - Left ventricle: The cavity size was moderately dilated. Wall   thickness was increased in a pattern of severe LVH. Systolic   function was normal. The estimated ejection fraction was in the   range of 55% to 60%. Wall motion was normal; there were no   regional wall motion abnormalities. The study is not technically   sufficient to allow evaluation of LV diastolic function. - Aortic valve: Trileaflet. Sclerosis without stenosis. There was   no regurgitation. Valve area (Vmax): 2.88 cm^2. - Aorta: Aortic root ML diameter: 41.95 mm (ED). - Aortic  root: The aortic root is mildly dilated. - Mitral valve: Calcified annulus. There was trivial regurgitation. - Left atrium: The atrium was mildly dilated. - Right ventricle: The cavity size was mildly dilated. Mildly   reduced systolic function. - Inferior vena cava: The vessel was normal in size. The   respirophasic diameter changes were in the  normal range (>= 50%),   consistent with normal central venous pressure. Impressions: - Compared to a prior study in 02/2017, there have been no   significant changes.   PHYSICAL EXAM  Temp:  [99.9 F (37.7 C)-101.4 F (38.6 C)] 99.9 F (37.7 C) (06/30 0400) Pulse Rate:  [55-103] 55 (06/30 0700) Resp:  [0-41] 26 (06/30 0700) BP: (98-160)/(49-72) 98/52 (06/30 0700) SpO2:  [96 %-100 %] 98 % (06/30 0700) Arterial Line BP: (92-170)/(59-96) 92/67 (06/30 0700) FiO2 (%):  [40 %] 40 % (06/30 0348) Weight:  [332 lb 3.7 oz (150.7 kg)] 332 lb 3.7 oz (150.7 kg) (06/30 0500)  General - morbid obesity, well developed, intubated.  Ophthalmologic - fundi not visualized due to noncooperation.  Cardiovascular - Regular rate and rhythm, not in afib.  Neuro - awake alert, intubated but off sedation, following simple commands on the right. PERRL, upward gaze with nystagmus, downward gaze symmetrical, left gaze symmetrical but right gaze palsy on the right. Blinking to visual threat bilaterally. Facial symmetry not able to test due to ET tube, not able to move tongue or mouth on command. Not able to frown. Weak corneal reflexes bilaterally, but positive gag and cough. R hand 4/5 finger movement, right foot 3/5 DF and PF, however, right UE and LE proximal 1/5 with pain. Left UE 0/5 and LE 1/5 with pain, DTR diminished and no babinski. Sensation, coordination and gait not tested.    ASSESSMENT/PLAN [name] is a 62 y.o. male with PMH of DM, hemoptysis, OSA, HTN, HLD, CAD, afib off Eliquis due to hemoptysis presenting with slurred speech, right sided  weakness. Was intubated for hemoptysis prevention while giving tPA. Symptoms worsened and imaging confirmed BA occlusion. S/p IR with revascularization of BA as well as left VA s/p stenting.  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   MRI head - large acute/early subacute infarction within the central pons involving both sides, greater on the right.  CTA H&N - severe vascular athero including BA stenosis, b/l VA stenosis, right ICA proximal 90% stenosis, b/l cavernous ICA stenosis R>L.  Repeat CTA H/N - new distal BA occlusion  DSA - revascularization of the distal basilar artery  To 90 % patency , and of 30 to 40 % of the LT VBJ with stent placement.  2D Echo - EF 55-60%  LDL - 23  HgbA1c - 9.1  VTE prophylaxis - SCDs  Eliquis (recently stopped) and plavix prior to admission, now on aspirin 81 mg daily and Brilinta . Hold off AC for now due to hemoptysis and large pontine stroke.   Ongoing aggressive stroke risk factor management  Therapy recommendations:  pending  Disposition:  Pending  Thyroid mass  CTA neck - right large thyroid mass compressing on trachea.   Thyroid mass larger than CT chest in 08/2016  Evidence of tracheal erosion on bronch at this admission  IR recommend thyroid biopsy once more stabilized  Based on thyroid biopsy, will consider oncology consult  Respiratory failure  CCM on board  Current intubated off sedation  On cuff leak  ENT and CCM recommend trach  PAF  Currently  on in afib  On eliquis in the past but off due to hemoptysis  Hold  off at this time due to hemoptysis and large pontine stroke  On ASA and brilinta  Rate controlled currently  D/c metoprolol due to low BP  Cerebral vascular athero  CTA head and neck showed right ICA proximal 90% stenosis, left ICA proximal athero, b/l cavernous ICA severe athro R>L, BA severe stenosis, b/l V4 severe athero with right V4 occlusion  S/p left V4 stent and BA revascularization  On ASA and brilinta  Hemoptysis   Since 08/2016 as per family  Not sure about etiology  TB ruled out in the past  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 Post procedure BP goal 120-150, now will relax to BP goal < 180   Hyperlipidemia  Lipid lowering medication PTA: Crestor 10 mg daily  LDL 23, goal < 70  On Crestor  Continue statin at discharge  Diabetes  HgbA1c 9.1, goal < 7.0  Uncontrolled  On insulin drip, CBG stable   CBG monitoring  CCM on board  Other Stroke Risk Factors  Advanced age  Former cigarette smoker - quit, 40 pack year history  Morbid obesity, Body mass index is 47.67 kg/m.    Hx stroke/TIA  Coronary artery disease   PAD with claudication  OSA on CPAP  Other Active Problems  Leukocytosis WBC 13.1   Hospital day # 3  This patient is critically ill due to stroke, PAF, thyroid mass, hemoptysis, severe vascular athero and at significant risk of neurological worsening, death form recurrent stroke, hemorrhagic conversion, heart failure, bleeding, shock. This patient's care requires constant monitoring of vital signs, hemodynamics, respiratory and cardiac monitoring, review of multiple databases, neurological assessment, discussion with family, other specialists and medical decision making of high complexity. I spent 35 minutes of neurocritical care time in the care  of this patient. I had long discussion with girlfriend at bedside, updated pt current condition, treatment plan and potential prognosis. She expressed understanding and appreciation. I also discussed with Dr. Lorella Nimrod.  Rosalin Hawking, MD PhD Stroke Neurology 11/21/2017 9:31 AM   To contact Stroke Continuity provider, please refer to [email or web address] After hours, contact General Neurology

## 2017-11-21 NOTE — Progress Notes (Signed)
Pt coughing against vent. Pt suctioned with moderate amount bloody secretions. Post suction pt HR went to 190s-200s a.fib RVR. Pt still fighting vent. Sedation increased ELINK notified and lopressor PRN ordered.

## 2017-11-21 NOTE — Progress Notes (Signed)
Pharmacy Note  Cardene is on backorder and supply has now been depleted; spoke to [employer] who reports that pt has been titrated off Cardene.  Please note that if pt requires further BP control with infusion, consider using Cleviprex or placing central line for double-strength Cardene.  Wynona Neat, PharmD, BCPS 11/21/2017 12:28 AM

## 2017-11-21 NOTE — Progress Notes (Signed)
PULMONARY / CRITICAL CARE MEDICINE   Name: Daryll Spisak MRN: [id number] DOB: 06/01/1955    ADMISSION DATE:  11/18/2017 CONSULTATION DATE: 11/18/2017  REFERRING MD: Emergency department  CHIEF COMPLAINT: Stroke  HISTORY OF PRESENT ILLNESS:    Mr. Qu  is a 62 year old male with a plethora of health issues who presented to [facility] on 11/18/2017 after contacting 911 when he noted slurred speech and right facial.  He was transported to [facility] emergency department code stroke was initiated.  He had been on Eliquis in the past for paroxysmal atrial fibrillation have been stopped 3 weeks prior due to hemoptysis. 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  There was concern that TPA could not be given due to recent hemoptysis and erosion of the thyroid nodule into the airway. He was urgently intubated by Dr. Nelda Marseille and erosion site was covered with the balloon cuff.  Patient was then given TPA and will be transferred to the neuro stroke unit. He will most likely remain intubated for period of 24 to 48 hours.  He will need further evaluation for most likely cancerous process in the neck and the lung.  6/28 The patient is on a ventialtor in the ICU. He had an intervention on the basilar artery and left vertebrobasilar jn. His vitals are stable. The apotient does have significant residual disease in the right proximal ICA. He developed coolness and pallor in the right distal leg. The foot is cool.He has been cseen by the [employer]. 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 The patient was apparently not aware of this finding preadmission.  6/29 The patient is off all sedation presently. He is responsive and able to follow commands. Neurology and I  had a long discussion with his family and girlfriend about his  condition and plans. The patient has stable vitals. He did not have  A cuff leak this AM.  6/30 The patient is awake and neurologically stable. He does not have much of an air leak when the cuff is deflated. If this persists the patient will likely need a tracheostomy. I believe we need a biopsy as we need to have some therapy to help shrink the thyroid and nodes if this is in fact a metastatic thyroid cancer.      PAST MEDICAL HISTORY :  He  has a past medical history of Collagen vascular disease ([place]), Coronary artery disease, GERD (gastroesophageal reflux disease), High cholesterol, Hypertension, OSA (obstructive sleep apnea), Pneumonia (2011), and Type II diabetes mellitus ([place]) (07/2016).  PAST SURGICAL HISTORY: He  has a past surgical history that includes Video bronchoscopy (Bilateral, 08/25/2016); Lower Extremity Angiography (Bilateral, 04/01/2017); PERIPHERAL VASCULAR INTERVENTION (Left, 04/01/2017); Coronary angioplasty with stent (2011; 07/2016); and Radiology with anesthesia (N/A, 11/18/2017).  No Known Allergies  No current facility-administered medications on file prior to encounter.    Current Outpatient Medications on File Prior to Encounter  Medication Sig  . apixaban (ELIQUIS) 5 MG TABS tablet Take 1 tablet (5 mg total) 2 (two) times daily by mouth.  . dronedarone (MULTAQ) 400 MG tablet Take 1 tablet (400 mg total) 2 (two) times daily with a meal by mouth.  . irbesartan (AVAPRO) 75 MG tablet TAKE ONE TABLET BY MOUTH ONE TIME DAILY   . liraglutide (VICTOZA) 18 MG/3ML SOPN Inject 0.2 mLs (1.2 mg total) into the skin daily  at 10 pm. Start with 0.6 mg for one week, then increase to 1.2 mg daily.  . metoprolol tartrate (LOPRESSOR) 50 MG tablet Take 1 tablet (50 mg total) by mouth 2 (two) times daily.  . Omega-3 Fatty Acids (FISH OIL) 1000 MG CAPS Take 2 capsules (2,000 mg total) by mouth 2 (two) times daily.  . pantoprazole (PROTONIX) 40 MG tablet TAKE 1 TABLET BY MOUTH ONCE A  DAY 30 TO 60 MINUTES BEFORE FIRST MEAL OF THE DAY   . B Complex Vitamins (VITAMIN B COMPLEX PO) Take 1 capsule at bedtime by mouth.   . clopidogrel (PLAVIX) 75 MG tablet Take 1 tablet (75 mg total) by mouth daily. (Patient not taking: Reported on 11/20/2017)  . famotidine (PEPCID) 20 MG tablet One at bedtime  . fenofibrate 160 MG tablet Take 1 tablet (160 mg total) by mouth daily.  . metFORMIN (GLUCOPHAGE) 1000 MG tablet Take 1,000 mg by mouth 2 (two) times daily with a meal.  . nitroGLYCERIN (NITROSTAT) 0.4 MG SL tablet Place 1 tablet (0.4 mg total) under the tongue every 5 (five) minutes as needed for chest pain.  . rosuvastatin (CRESTOR) 10 MG tablet Take 5 mg by mouth at bedtime.    FAMILY HISTORY:  His indicated that his mother is alive. He indicated that his father is deceased. He indicated that his sister is deceased. He indicated that his brother is alive.   SOCIAL HISTORY: He  reports that he quit smoking about 5 years ago. His smoking use included cigarettes. He has a 40.00 pack-year smoking history. He has never used smokeless tobacco. He reports that he drinks alcohol. He reports that he does not use drugs.  REVIEW OF SYSTEMS:   Not available  SUBJECTIVE:  Intubated stable mechanical ventilatory support  VITAL SIGNS: BP 122/61   Pulse 84   Temp 99.1 F (37.3 C) (Axillary)   Resp (!) 27   Ht 5\' 10"  (1.778 m)   Wt (!) 332 lb 3.7 oz (150.7 kg)   SpO2 98%   BMI 47.67 kg/m   HEMODYNAMICS:    VENTILATOR SETTINGS: Vent Mode: PSV;CPAP FiO2 (%):  [40 %] 40 % Set Rate:  [26 bmp] 26 bmp Vt Set:  [580 mL] 580 mL PEEP:  [5 cmH20] 5 cmH20 Pressure Support:  [18 cmH20] 18 cmH20 Plateau Pressure:  [17 cmH20-24 cmH20] 23 cmH20  INTAKE / OUTPUT: I/O last 3 completed shifts: In: 5409.5 [I.V.:4855.4; NG/GT:480.9; IV Piggyback:73.2] Out: 995 [Urine:995]  PHYSICAL EXAMINATION: General:  Acutely ill appearing morbidly obese male Neuro:  Awake and following commands on the  right HEENT:  [place]/AT, PERRL, EOM-I and MMM Cardiovascular:  RRR, Nl S1/S2 and -M/R/G Lungs:  Coarse BS diffusely, Bilateral wheeze mild Abdomen:  Soft, NT, ND and +BS Musculoskeletal:  -edema and -tenderness Skin:  Intact  LABS:  BMET Recent Labs  Lab 11/18/17 0928 11/18/17 0933  11/18/17 1539 11/18/17 1648 11/19/17 0437  NA 134* 135   < > 136 138 137  K 4.3 4.2   < > 4.1 4.0 3.6  CL 99 100  --   --   --  107  CO2 23  --   --   --   --  22  BUN 13 14  --   --   --  11  CREATININE 0.69 0.50*  --   --   --  0.54*  GLUCOSE 377* 381*   < > 302* 258* 176*   < > = values in this  interval not displayed.    Electrolytes Recent Labs  Lab 11/18/17 0928 11/19/17 0437 11/20/17 1633 11/21/17 0500  CALCIUM 9.8 8.5*  --   --   MG  --  1.6* 2.2 2.2  PHOS  --  2.9 3.0 2.8    CBC Recent Labs  Lab 11/18/17 0928  11/18/17 1648 11/18/17 1755 11/19/17 0437  WBC 9.3  --   --   --  13.1*  HGB 13.6   < > 10.5* 11.5* 11.2*  HCT 42.5   < > 31.0* 36.2* 35.2*  PLT 276  --   --   --  273   < > = values in this interval not displayed.    Coag's Recent Labs  Lab 11/18/17 0928  APTT 31  INR 0.91    Sepsis Markers No results for input(s): LATICACIDVEN, PROCALCITON, O2SATVEN in the last 168 hours.  ABG Recent Labs  Lab 11/18/17 1355 11/18/17 1450 11/19/17 0435  PHART 7.473* 7.291* 7.401  PCO2ART 28.2* 53.1* 38.6  PO2ART 155.0* 378.0* 124*    Liver Enzymes Recent Labs  Lab 11/18/17 0928  AST 24  ALT 34  ALKPHOS 58  BILITOT 1.0  ALBUMIN 3.5    Cardiac Enzymes No results for input(s): TROPONINI, PROBNP in the last 168 hours.  Glucose Recent Labs  Lab 11/21/17 0307 11/21/17 0415 11/21/17 0518 11/21/17 0622 11/21/17 0731 11/21/17 0849  GLUCAP 197* 162* 192* 168* 154* 166*    Imaging Dg Abd Portable 1v  Result Date: 11/20/2017 CLINICAL DATA:  Evaluate OG tube placement EXAM: PORTABLE ABDOMEN - 1 VIEW COMPARISON:  None. FINDINGS: The OG tube terminates in  the region of the gastric body. IMPRESSION: The OG tube terminates in the region of the gastric body. Electronically Signed   By: Dorise Bullion III M.D   On: 11/20/2017 18:51        CULTURES: 11/18/2017 sputum>>  ANTIBIOTICS: 11/18/2017 Unasyn>> SIGNIFICANT EVENTS: 11/18/2017 intubated  LINES/TUBES: 11/18/2017 endotracheal tube>>  DISCUSSION: Mr. Gehl  is a 62 year old male with a plethora of health issues who presented to [facility] on 11/18/2017 after contacting 911 when he noted slurred speech and right facial.  He was transported to [facility] emergency department code stroke was initiated.  He had been on Eliquis in the past for paroxysmal atrial fibrillation have been stopped 3 weeks prior due to hemoptysis. 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  There was concern that TPA could not be given due to recent hemoptysis and erosion of the thyroid nodule into the airway. He was urgently intubated by Dr. Nelda Marseille and erosion site was covered with the balloon cuff.  Patient was then given TPA and will be transferred to the neuro stroke unit. He will most likely remain intubated for period of 24 to 48 hours.  He will need further evaluation for most likely cancerous process in the neck and the lung.  ASSESSMENT / PLAN:  PULMONARY A: Vent dependent respiratory failure secondary to urgent need for intubation for TPA infusion with recent hemoptysis and thyroid erosion and airway secondary to thyroid nodules is increased from 1.40 x 2.5 to 10 in the ensuing several months months. Chronic obstructive pulmonary disease Former smoker Multiple lung nodules suspicious for cancer  Apparently the source of bleeding was 3 cm below the cords on FOB. The patient is on 40% FIO2.  The patient has a large consolidated area in the  lung and mediastinal adenopahty. The adenopathy could be related to  the disease in the thyroid. The area of consolidation may be a RUL pneumonia but there could be an underling mass as well.  6/29  As noted the patient did not have a cuff leak. An SBT trial was started this AM but the patient did not tolerate it and became tachypneic in a short time. There appears to be a large node abutting the trachea on the left on his CT scan. I am going to have cardiothoracic weight in on it.  6/30 No cuff leak today. The patient is a bit wheezy. He has increased  Off -yellow secretions. We will send a culture. No change in 02 requirements. I have added HHN treatments.   CARDIOVASCULAR A:  History of  Paroxysmal atrial fibrillation, currently off anticoagulation for recent hemoptysis 3 weeks duration Hypertension Coronary artery disease Peripheral vascular disease  The patient is in Atrial fib but I would be very loathe to fully anticoagulate him at this time.   RENAL  A:   No acute renal issues P:   Monitor electrolytes Avoid nephrotoxins. The patient's urine output is not great and his urine is a little dark. His creatinine is on thenormal range presently. The patient is getting 100cc/hr plus some solusets.  Thyroid Mass The patient likely has metastatic thyroid cancer. The thyroid may have eroded into the trachea causing the beleeding yesterday.Will need to have ENT see thepatient in the next few days. I have requested an US guided bx with the IR service not likerly to happen before Monday.   GASTROINTESTINAL A:   History of GERD P:   Proton pump inhibitor Gastric tube  Will start OGT feeds  HEMATOLOGIC A:   Chronic anticoagulation for atrial fibrillation Off anticoagulation for 3 weeks due to her hemoptysis Received TPA 11/18/2017 for right MCA stroke P:  Observe for possible hemorrhage Monitor hemoglobin hematocrit Hb today is 11.2,. It does not appear that there is active bleeding. We can proably place the patient on low dose heparin for DVT  prophylaxis.  INFECTIOUS A:   Observed aspiration P:   Placed on Unasyn Culture as needed  ENDOCRINE CBG (last 3)  Recent Labs    11/21/17 0622 11/21/17 0731 11/21/17 0849  GLUCAP 168* 154* 166*    A:   Diabetes mellitus P:   Sliding scale insulin  PAD The patient has a cool right foot. vascualr has seen the patient No intervention isd planned for now. Will montor. Reluctant to anticoagualt patient given the recent bleeding issues. 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 Patient with basilar artery thrombois and s/p intervention with stnet placement in the basilar artery and left VBJ jn. Discussed with neurology. 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 Can move the roight side fairly well. 6/30 Appears stable form a neurolgic point of view.   FAMILY  We discussed the major issues with his family at the bedside

## 2017-11-22 ENCOUNTER — Inpatient Hospital Stay: Payer: Self-pay

## 2017-11-22 ENCOUNTER — Telehealth: Payer: Self-pay | Admitting: Vascular Surgery

## 2017-11-22 ENCOUNTER — Inpatient Hospital Stay (HOSPITAL_COMMUNITY): Payer: 59

## 2017-11-22 ENCOUNTER — Encounter (HOSPITAL_COMMUNITY): Payer: Self-pay | Admitting: Interventional Radiology

## 2017-11-22 DIAGNOSIS — Z930 Tracheostomy status: Secondary | ICD-10-CM | POA: Diagnosis not present

## 2017-11-22 DIAGNOSIS — J90 Pleural effusion, not elsewhere classified: Secondary | ICD-10-CM | POA: Diagnosis not present

## 2017-11-22 DIAGNOSIS — R402133 Coma scale, eyes open, to sound, at hospital admission: Secondary | ICD-10-CM | POA: Diagnosis present

## 2017-11-22 DIAGNOSIS — C770 Secondary and unspecified malignant neoplasm of lymph nodes of head, face and neck: Secondary | ICD-10-CM | POA: Diagnosis present

## 2017-11-22 DIAGNOSIS — R6520 Severe sepsis without septic shock: Secondary | ICD-10-CM | POA: Diagnosis not present

## 2017-11-22 DIAGNOSIS — D6859 Other primary thrombophilia: Secondary | ICD-10-CM | POA: Diagnosis present

## 2017-11-22 DIAGNOSIS — R29722 NIHSS score 22: Secondary | ICD-10-CM | POA: Diagnosis not present

## 2017-11-22 DIAGNOSIS — C73 Malignant neoplasm of thyroid gland: Secondary | ICD-10-CM | POA: Diagnosis not present

## 2017-11-22 DIAGNOSIS — G4733 Obstructive sleep apnea (adult) (pediatric): Secondary | ICD-10-CM | POA: Diagnosis not present

## 2017-11-22 DIAGNOSIS — R29720 NIHSS score 20: Secondary | ICD-10-CM | POA: Diagnosis not present

## 2017-11-22 DIAGNOSIS — R402313 Coma scale, best motor response, none, at hospital admission: Secondary | ICD-10-CM | POA: Diagnosis present

## 2017-11-22 DIAGNOSIS — I480 Paroxysmal atrial fibrillation: Secondary | ICD-10-CM | POA: Diagnosis not present

## 2017-11-22 DIAGNOSIS — G8250 Quadriplegia, unspecified: Secondary | ICD-10-CM | POA: Diagnosis present

## 2017-11-22 DIAGNOSIS — Z7189 Other specified counseling: Secondary | ICD-10-CM | POA: Diagnosis not present

## 2017-11-22 DIAGNOSIS — E119 Type 2 diabetes mellitus without complications: Secondary | ICD-10-CM | POA: Diagnosis not present

## 2017-11-22 DIAGNOSIS — Z515 Encounter for palliative care: Secondary | ICD-10-CM | POA: Diagnosis not present

## 2017-11-22 DIAGNOSIS — Z66 Do not resuscitate: Secondary | ICD-10-CM | POA: Diagnosis not present

## 2017-11-22 DIAGNOSIS — I639 Cerebral infarction, unspecified: Secondary | ICD-10-CM | POA: Diagnosis not present

## 2017-11-22 DIAGNOSIS — J810 Acute pulmonary edema: Secondary | ICD-10-CM | POA: Diagnosis not present

## 2017-11-22 DIAGNOSIS — Y95 Nosocomial condition: Secondary | ICD-10-CM | POA: Diagnosis not present

## 2017-11-22 DIAGNOSIS — I6340 Cerebral infarction due to embolism of unspecified cerebral artery: Secondary | ICD-10-CM | POA: Diagnosis not present

## 2017-11-22 DIAGNOSIS — I63411 Cerebral infarction due to embolism of right middle cerebral artery: Secondary | ICD-10-CM | POA: Diagnosis present

## 2017-11-22 DIAGNOSIS — G835 Locked-in state: Secondary | ICD-10-CM | POA: Diagnosis present

## 2017-11-22 DIAGNOSIS — J189 Pneumonia, unspecified organism: Secondary | ICD-10-CM | POA: Diagnosis not present

## 2017-11-22 DIAGNOSIS — A419 Sepsis, unspecified organism: Secondary | ICD-10-CM | POA: Diagnosis not present

## 2017-11-22 DIAGNOSIS — E079 Disorder of thyroid, unspecified: Secondary | ICD-10-CM | POA: Diagnosis not present

## 2017-11-22 DIAGNOSIS — J15212 Pneumonia due to Methicillin resistant Staphylococcus aureus: Secondary | ICD-10-CM | POA: Diagnosis not present

## 2017-11-22 DIAGNOSIS — R042 Hemoptysis: Secondary | ICD-10-CM | POA: Diagnosis present

## 2017-11-22 DIAGNOSIS — R5081 Fever presenting with conditions classified elsewhere: Secondary | ICD-10-CM | POA: Diagnosis not present

## 2017-11-22 DIAGNOSIS — J9621 Acute and chronic respiratory failure with hypoxia: Secondary | ICD-10-CM | POA: Diagnosis not present

## 2017-11-22 DIAGNOSIS — J9600 Acute respiratory failure, unspecified whether with hypoxia or hypercapnia: Secondary | ICD-10-CM | POA: Diagnosis present

## 2017-11-22 DIAGNOSIS — G8194 Hemiplegia, unspecified affecting left nondominant side: Secondary | ICD-10-CM | POA: Diagnosis present

## 2017-11-22 DIAGNOSIS — R29710 NIHSS score 10: Secondary | ICD-10-CM | POA: Diagnosis present

## 2017-11-22 DIAGNOSIS — R509 Fever, unspecified: Secondary | ICD-10-CM | POA: Diagnosis not present

## 2017-11-22 DIAGNOSIS — J9601 Acute respiratory failure with hypoxia: Secondary | ICD-10-CM | POA: Diagnosis not present

## 2017-11-22 DIAGNOSIS — Z9911 Dependence on respirator [ventilator] status: Secondary | ICD-10-CM | POA: Diagnosis not present

## 2017-11-22 DIAGNOSIS — I4891 Unspecified atrial fibrillation: Secondary | ICD-10-CM | POA: Diagnosis not present

## 2017-11-22 DIAGNOSIS — R402233 Coma scale, best verbal response, inappropriate words, at hospital admission: Secondary | ICD-10-CM | POA: Diagnosis present

## 2017-11-22 DIAGNOSIS — R29717 NIHSS score 17: Secondary | ICD-10-CM | POA: Diagnosis not present

## 2017-11-22 DIAGNOSIS — I509 Heart failure, unspecified: Secondary | ICD-10-CM | POA: Diagnosis not present

## 2017-11-22 DIAGNOSIS — I6389 Other cerebral infarction: Secondary | ICD-10-CM | POA: Diagnosis not present

## 2017-11-22 DIAGNOSIS — Z4659 Encounter for fitting and adjustment of other gastrointestinal appliance and device: Secondary | ICD-10-CM | POA: Diagnosis not present

## 2017-11-22 DIAGNOSIS — J9690 Respiratory failure, unspecified, unspecified whether with hypoxia or hypercapnia: Secondary | ICD-10-CM | POA: Diagnosis not present

## 2017-11-22 DIAGNOSIS — I651 Occlusion and stenosis of basilar artery: Secondary | ICD-10-CM | POA: Diagnosis not present

## 2017-11-22 LAB — GLUCOSE, CAPILLARY
GLUCOSE-CAPILLARY: 164 mg/dL — AB (ref 70–99)
GLUCOSE-CAPILLARY: 144 mg/dL — AB (ref 70–99)
GLUCOSE-CAPILLARY: 153 mg/dL — AB (ref 70–99)
GLUCOSE-CAPILLARY: 160 mg/dL — AB (ref 70–99)
GLUCOSE-CAPILLARY: 200 mg/dL — AB (ref 70–99)
GLUCOSE-CAPILLARY: 162 mg/dL — AB (ref 70–99)
GLUCOSE-CAPILLARY: 185 mg/dL — AB (ref 70–99)
GLUCOSE-CAPILLARY: 208 mg/dL — AB (ref 70–99)
GLUCOSE-CAPILLARY: 154 mg/dL — AB (ref 70–99)
GLUCOSE-CAPILLARY: 157 mg/dL — AB (ref 70–99)
GLUCOSE-CAPILLARY: 154 mg/dL — AB (ref 70–99)
Glucose-Capillary: 120 mg/dL — ABNORMAL HIGH (ref 70–99)
Glucose-Capillary: 138 mg/dL — ABNORMAL HIGH (ref 70–99)
Glucose-Capillary: 138 mg/dL — ABNORMAL HIGH (ref 70–99)
Glucose-Capillary: 139 mg/dL — ABNORMAL HIGH (ref 70–99)
Glucose-Capillary: 140 mg/dL — ABNORMAL HIGH (ref 70–99)
Glucose-Capillary: 148 mg/dL — ABNORMAL HIGH (ref 70–99)
Glucose-Capillary: 150 mg/dL — ABNORMAL HIGH (ref 70–99)
Glucose-Capillary: 153 mg/dL — ABNORMAL HIGH (ref 70–99)
Glucose-Capillary: 165 mg/dL — ABNORMAL HIGH (ref 70–99)
Glucose-Capillary: 166 mg/dL — ABNORMAL HIGH (ref 70–99)
Glucose-Capillary: 171 mg/dL — ABNORMAL HIGH (ref 70–99)
Glucose-Capillary: 172 mg/dL — ABNORMAL HIGH (ref 70–99)
Glucose-Capillary: 183 mg/dL — ABNORMAL HIGH (ref 70–99)

## 2017-11-22 LAB — CBC
HEMATOCRIT: 34.6 % — AB (ref 39.0–52.0)
Hemoglobin: 10.8 g/dL — ABNORMAL LOW (ref 13.0–17.0)
MCH: 28.6 pg (ref 26.0–34.0)
MCHC: 31.2 g/dL (ref 30.0–36.0)
MCV: 91.8 fL (ref 78.0–100.0)
Platelets: 199 10*3/uL (ref 150–400)
RBC: 3.77 MIL/uL — ABNORMAL LOW (ref 4.22–5.81)
RDW: 13.9 % (ref 11.5–15.5)
WBC: 10.7 10*3/uL — ABNORMAL HIGH (ref 4.0–10.5)

## 2017-11-22 LAB — BASIC METABOLIC PANEL
Anion gap: 6 (ref 5–15)
BUN: 16 mg/dL (ref 8–23)
CO2: 23 mmol/L (ref 22–32)
Calcium: 8.2 mg/dL — ABNORMAL LOW (ref 8.9–10.3)
Chloride: 111 mmol/L (ref 98–111)
Creatinine, Ser: 0.61 mg/dL (ref 0.61–1.24)
GFR calc Af Amer: >60 mL/min (ref >60)
GFR calc non Af Amer: >60 mL/min (ref >60)
GLUCOSE: 198 mg/dL — AB (ref 70–99)
Potassium: 3.4 mmol/L — ABNORMAL LOW (ref 3.5–5.1)
Sodium: 140 mmol/L (ref 135–145)

## 2017-11-22 LAB — PHOSPHORUS: Phosphorus: 2.9 mg/dL (ref 2.5–4.6)

## 2017-11-22 LAB — MAGNESIUM: Magnesium: 2.1 mg/dL (ref 1.7–2.4)

## 2017-11-22 LAB — PROTIME-INR
INR: 1.12
PROTHROMBIN TIME: 14.3 s (ref 11.4–15.2)

## 2017-11-22 LAB — APTT: aPTT: 34 seconds (ref 24–36)

## 2017-11-22 LAB — BRAIN NATRIURETIC PEPTIDE: B Natriuretic Peptide: 170.4 pg/mL — ABNORMAL HIGH (ref 0.0–100.0)

## 2017-11-22 MED ORDER — SODIUM CHLORIDE 0.9% FLUSH
10.0000 mL | INTRAVENOUS | Status: DC | PRN
  Administered 2017-12-11 – 2017-12-12 (×3): 10 mL
  Filled 2017-11-22 (×3): qty 40

## 2017-11-22 MED ORDER — FENTANYL BOLUS VIA INFUSION
50.0000 ug | INTRAVENOUS | Status: DC | PRN
  Administered 2017-11-22 – 2017-11-25 (×4): 100 ug via INTRAVENOUS
  Administered 2017-11-25 (×2): 50 ug via INTRAVENOUS
  Administered 2017-11-26 – 2017-11-29 (×5): 100 ug via INTRAVENOUS
  Administered 2017-12-12 (×2): 25 ug via INTRAVENOUS
  Administered 2017-12-13: 50 ug via INTRAVENOUS
  Filled 2017-11-22: qty 100

## 2017-11-22 MED ORDER — CARVEDILOL 3.125 MG PO TABS: 6.2500 mg | ORAL_TABLET | Freq: Two times a day (BID) | ORAL | Status: DC

## 2017-11-22 MED ORDER — CARVEDILOL 3.125 MG PO TABS
6.2500 mg | ORAL_TABLET | Freq: Two times a day (BID) | ORAL | Status: DC
  Administered 2017-11-22 – 2017-12-08 (×32): 6.25 mg via ORAL
  Filled 2017-11-22 (×33): qty 2

## 2017-11-22 MED ORDER — SODIUM CHLORIDE 0.9 % IV BOLUS
1000.0000 mL | Freq: Once | INTRAVENOUS | Status: AC
  Administered 2017-11-22: 1000 mL via INTRAVENOUS

## 2017-11-22 MED ORDER — DEXMEDETOMIDINE HCL IN NACL 400 MCG/100ML IV SOLN
0.0000 ug/kg/h | INTRAVENOUS | Status: AC
  Administered 2017-11-22: 1 ug/kg/h via INTRAVENOUS
  Administered 2017-11-22: 0.4 ug/kg/h via INTRAVENOUS
  Administered 2017-11-22: 0.6 ug/kg/h via INTRAVENOUS
  Administered 2017-11-22: 1 ug/kg/h via INTRAVENOUS
  Administered 2017-11-22: 0.8 ug/kg/h via INTRAVENOUS
  Administered 2017-11-23: 0.6 ug/kg/h via INTRAVENOUS
  Administered 2017-11-23: 0.7 ug/kg/h via INTRAVENOUS
  Administered 2017-11-23: 1 ug/kg/h via INTRAVENOUS
  Administered 2017-11-23: 0.7 ug/kg/h via INTRAVENOUS
  Administered 2017-11-23: 0.9 ug/kg/h via INTRAVENOUS
  Administered 2017-11-23: 1 ug/kg/h via INTRAVENOUS
  Administered 2017-11-24 (×5): 0.7 ug/kg/h via INTRAVENOUS
  Administered 2017-11-24: 0.6 ug/kg/h via INTRAVENOUS
  Administered 2017-11-25 (×2): 0.7 ug/kg/h via INTRAVENOUS
  Administered 2017-11-25: 1.1 ug/kg/h via INTRAVENOUS
  Administered 2017-11-25 (×3): 0.7 ug/kg/h via INTRAVENOUS
  Administered 2017-11-26: 1.1 ug/kg/h via INTRAVENOUS
  Administered 2017-11-26: 1 ug/kg/h via INTRAVENOUS
  Administered 2017-11-26 (×2): 1.2 ug/kg/h via INTRAVENOUS
  Administered 2017-11-26 (×2): 1.1 ug/kg/h via INTRAVENOUS
  Administered 2017-11-26: 1.2 ug/kg/h via INTRAVENOUS
  Administered 2017-11-26: 0.6 ug/kg/h via INTRAVENOUS
  Administered 2017-11-26: 1.2 ug/kg/h via INTRAVENOUS
  Administered 2017-11-26: 0.8 ug/kg/h via INTRAVENOUS
  Administered 2017-11-27: 1 ug/kg/h via INTRAVENOUS
  Administered 2017-11-27: 1.1 ug/kg/h via INTRAVENOUS
  Administered 2017-11-27: 1 ug/kg/h via INTRAVENOUS
  Administered 2017-11-27 (×3): 1.2 ug/kg/h via INTRAVENOUS
  Administered 2017-11-27 – 2017-11-28 (×6): 1 ug/kg/h via INTRAVENOUS
  Filled 2017-11-22 (×46): qty 100

## 2017-11-22 MED ORDER — SODIUM CHLORIDE 0.9 % IV SOLN
INTRAVENOUS | Status: DC
  Administered 2017-11-22: 6.3 [IU]/h via INTRAVENOUS
  Administered 2017-11-22: 3.1 [IU]/h via INTRAVENOUS
  Administered 2017-11-23: 10 [IU]/h via INTRAVENOUS
  Administered 2017-11-24: 10.6 [IU]/h via INTRAVENOUS
  Administered 2017-11-24: 6.7 [IU]/h via INTRAVENOUS
  Administered 2017-11-25: 10.1 [IU]/h via INTRAVENOUS
  Filled 2017-11-22 (×6): qty 1

## 2017-11-22 MED ORDER — CHLORHEXIDINE GLUCONATE CLOTH 2 % EX PADS
6.0000 | MEDICATED_PAD | Freq: Every day | CUTANEOUS | Status: DC
  Administered 2017-11-22: 6 via TOPICAL

## 2017-11-22 MED ORDER — SODIUM CHLORIDE 0.9% FLUSH
10.0000 mL | Freq: Two times a day (BID) | INTRAVENOUS | Status: DC
  Administered 2017-11-24: 10 mL
  Administered 2017-11-25: 30 mL
  Administered 2017-11-26: 10 mL
  Administered 2017-11-26: 30 mL
  Administered 2017-11-27: 10 mL
  Administered 2017-11-27: 30 mL
  Administered 2017-11-28 – 2017-12-04 (×11): 10 mL
  Administered 2017-12-05: 20 mL
  Administered 2017-12-06 – 2017-12-15 (×15): 10 mL

## 2017-11-22 NOTE — Progress Notes (Signed)
OT Cancellation Note  Patient Details Name: Emmaus Brandi MRN: [id number] DOB: 1955-07-13   Cancelled Treatment:    Reason Eval/Treat Not Completed: Patient not medically ready(Bedrest with incr sedation due to HR increases)  Vonita Moss   OTR/L Pager: [phone] Office: [phone] .  11/22/2017, 6:53 AM

## 2017-11-22 NOTE — Progress Notes (Signed)
Peripherally Inserted Central Catheter/Midline Placement  The IV Nurse has discussed with the patient and/or persons authorized to consent for the patient, the purpose of this procedure and the potential benefits and risks involved with this procedure.  The benefits include less needle sticks, lab draws from the catheter, and the patient may be discharged home with the catheter. Risks include, but not limited to, infection, bleeding, blood clot (thrombus formation), and puncture of an artery; nerve damage and irregular heartbeat and possibility to perform a PICC exchange if needed/ordered by physician.  Alternatives to this procedure were also discussed.  Bard Power PICC patient education guide, fact sheet on infection prevention and patient information card has been provided to patient /or left at bedside.    PICC/Midline Placement Documentation  PICC Triple Lumen 11/22/17 PICC Right Brachial 49 cm 0 cm (Active)  Indication for Insertion or Continuance of Line Prolonged intravenous therapies 11/22/2017  1:12 PM  Exposed Catheter (cm) 0 cm 11/22/2017  1:12 PM  Site Assessment Clean;Dry;Intact 11/22/2017  1:12 PM  Lumen #1 Status Flushed;Blood return noted 11/22/2017  1:12 PM  Lumen #2 Status Flushed;Blood return noted 11/22/2017  1:12 PM  Lumen #3 Status Flushed;Blood return noted 11/22/2017  1:12 PM  Dressing Type Transparent 11/22/2017  1:12 PM  Dressing Status Clean;Dry;Intact;Antimicrobial disc in place 11/22/2017  1:12 PM  Dressing Intervention New dressing 11/22/2017  1:12 PM  Dressing Change Due 11/29/17 11/22/2017  1:12 PM   Domestitc partner signed conesnt at bedside     Christella Noa Albarece 11/22/2017, 1:13 PM

## 2017-11-22 NOTE — Telephone Encounter (Signed)
sch appt phone NA 12/29/17  10am ABI 1030am p/o MD

## 2017-11-22 NOTE — Progress Notes (Signed)
eLink Physician-Brief Progress Note Patient Name: Myshawn Chiriboga DOB: 1955/07/04 MRN: [id number]   Date of Service  11/22/2017  HPI/Events of Note  Ventilator dyssynchrony - Patient biting on ETT.   eICU Interventions  Nurse instructed to use existing orders: 1. Bolus with Fentanyl and Versed PRN. 2. Titrate Fentanyl and Precedex IV infusions as tolerated.      Intervention Category Major Interventions: Delirium, psychosis, severe agitation - evaluation and management  Gerry Blanchfield Eugene 11/22/2017, 7:47 PM

## 2017-11-22 NOTE — Progress Notes (Signed)
Patient ID: [name] [name], male   DOB: 24-Jan-1956, 62 y.o.   MRN: [id number]   Request received for left thyroid mass biopsy  Recent basilar artery revascularization 11/18/17 On Brilinta   Dr Pearline Cables note today: Thyroid Mass The patient likely has metastatic thyroid cancer. The thyroid may have eroded into the trachea causing the beleeding yesterday.Will need to have ENT see thepatient in the next few days. I have requested an US guided bx with the IR service not likerly to happen before Monday.   I have reviewed imaging and case with Dr Pascal Lux Rec: diagnostic US Thyroid first Consider right cervical lymph node biopsy ** will await Thyroid US**  Dr Pearline Cables aware He is having ENT consult now

## 2017-11-22 NOTE — Progress Notes (Signed)
PULMONARY / CRITICAL CARE MEDICINE   Name: Kalmen Lollar MRN: [id number] DOB: 03-11-1956    ADMISSION DATE:  11/18/2017 CONSULTATION DATE: 11/18/2017  REFERRING MD: Emergency department  CHIEF COMPLAINT: Stroke  HISTORY OF PRESENT ILLNESS:    Mr. Bruun  is a 62 year old male with a plethora of health issues who presented to [facility] on 11/18/2017 after contacting 911 when he noted slurred speech and right facial.  He was transported to [facility] emergency department code stroke was initiated.  He had been on Eliquis in the past for paroxysmal atrial fibrillation have been stopped 3 weeks prior due to hemoptysis. 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  There was concern that TPA could not be given due to recent hemoptysis and erosion of the thyroid nodule into the airway. He was urgently intubated by Dr. Nelda Marseille and erosion site was covered with the balloon cuff.  Patient was then given TPA and will be transferred to the neuro stroke unit. He will most likely remain intubated for period of 24 to 48 hours.  He will need further evaluation for most likely cancerous process in the neck and the lung.  6/28 The patient is on a ventialtor in the ICU. He had an intervention on the basilar artery and left vertebrobasilar jn.A stent is in place He developed coolness and pallor in the right distal leg. The foot is cool.He has been seen by the vacuar service. The patient was dicovered to have a large thyroid mass with adenopathy that likely represents a thyroid cancer. The patient was apparently not aware of this finding preadmission.  6/29 The patient is off all sedation presently. He is responsive and able to follow commands. Neurology and I  had a long discussion with his family and girlfriend about his condition and plans. The patient has stable vitals. He did not have  A cuff leak this  AM.  6/30 The patient is awake and neurologically stable. He does not have much of an air leak when the cuff is deflated. If this persists the patient will likely need a tracheostomy. I believe we need a biopsy as we need to have some therapy to help shrink the thyroid and nodes if this is in fact a metastatic thyroid cancer.  7/1 there was some hemoptysis again last night.  Respiratory continues to suction some bloody secretions from the endotracheal tube today.  He is sedated on a combination of fentanyl and Versed but does follow simple instructions for me today.      PAST MEDICAL HISTORY :  He  has a past medical history of Collagen vascular disease ([place]), Coronary artery disease, GERD (gastroesophageal reflux disease), High cholesterol, Hypertension, OSA (obstructive sleep apnea), Pneumonia (2011), and Type II diabetes mellitus ([place]) (07/2016).  PAST SURGICAL HISTORY: He  has a past surgical history that includes Video bronchoscopy (Bilateral, 08/25/2016); Lower Extremity Angiography (Bilateral, 04/01/2017); PERIPHERAL VASCULAR INTERVENTION (Left, 04/01/2017); Coronary angioplasty with stent (2011; 07/2016); and Radiology with anesthesia (N/A, 11/18/2017).  No Known Allergies  No current facility-administered medications on file prior to encounter.    Current Outpatient Medications on File Prior to Encounter  Medication Sig  . apixaban (ELIQUIS) 5 MG TABS tablet Take 1 tablet (5 mg total) 2 (two) times daily by mouth.  . dronedarone (MULTAQ) 400 MG tablet Take 1 tablet (400 mg total) 2 (two) times daily with a meal by mouth.  . irbesartan (  AVAPRO) 75 MG tablet TAKE ONE TABLET BY MOUTH ONE TIME DAILY   . liraglutide (VICTOZA) 18 MG/3ML SOPN Inject 0.2 mLs (1.2 mg total) into the skin daily at 10 pm. Start with 0.6 mg for one week, then increase to 1.2 mg daily.  . metoprolol tartrate (LOPRESSOR) 50 MG tablet Take 1 tablet (50 mg total) by mouth 2 (two) times daily.  . Omega-3 Fatty Acids  (FISH OIL) 1000 MG CAPS Take 2 capsules (2,000 mg total) by mouth 2 (two) times daily.  . pantoprazole (PROTONIX) 40 MG tablet TAKE 1 TABLET BY MOUTH ONCE A DAY 30 TO 60 MINUTES BEFORE FIRST MEAL OF THE DAY   . B Complex Vitamins (VITAMIN B COMPLEX PO) Take 1 capsule at bedtime by mouth.   . clopidogrel (PLAVIX) 75 MG tablet Take 1 tablet (75 mg total) by mouth daily. (Patient not taking: Reported on 11/20/2017)  . famotidine (PEPCID) 20 MG tablet One at bedtime  . fenofibrate 160 MG tablet Take 1 tablet (160 mg total) by mouth daily.  . metFORMIN (GLUCOPHAGE) 1000 MG tablet Take 1,000 mg by mouth 2 (two) times daily with a meal.  . nitroGLYCERIN (NITROSTAT) 0.4 MG SL tablet Place 1 tablet (0.4 mg total) under the tongue every 5 (five) minutes as needed for chest pain.  . rosuvastatin (CRESTOR) 10 MG tablet Take 5 mg by mouth at bedtime.    FAMILY HISTORY:  His indicated that his mother is alive. He indicated that his father is deceased. He indicated that his sister is deceased. He indicated that his brother is alive.   SOCIAL HISTORY: He  reports that he quit smoking about 5 years ago. His smoking use included cigarettes. He has a 40.00 pack-year smoking history. He has never used smokeless tobacco. He reports that he drinks alcohol. He reports that he does not use drugs.  REVIEW OF SYSTEMS:   Not available  SUBJECTIVE:  Intubated stable mechanical ventilatory support  VITAL SIGNS: BP 103/68   Pulse (!) 104   Temp 99.2 F (37.3 C) (Axillary)   Resp (!) 26   Ht 5\' 10"  (1.778 m)   Wt (!) 336 lb 10.3 oz (152.7 kg)   SpO2 100%   BMI 48.30 kg/m   HEMODYNAMICS:    VENTILATOR SETTINGS: Vent Mode: PRVC FiO2 (%):  [40 %-60 %] 60 % Set Rate:  [26 bmp] 26 bmp Vt Set:  [580 mL] 580 mL PEEP:  [5 cmH20] 5 cmH20 Pressure Support:  [16 cmH20] 16 cmH20 Plateau Pressure:  [14 cmH20-24 cmH20] 24 cmH20  INTAKE / OUTPUT: I/O last 3 completed shifts: In: 8975.9 [I.V.:3306.8; NG/GT:1875;  IV Piggyback:3794.1] Out: 1950 [Urine:1570]  PHYSICAL EXAMINATION: General: Obese middle-aged male orally intubated and mechanically ventilated in no overt distress.   Neuro: Despite sedation he is following instructions for me wiggling all fingers and toes on request.  Pupils are equal at 3 mm HEENT: He has a very short thick neck, I cannot palpate his thyroid mass.   Cardiovascular: S1 and S2 are irregularly irregular without murmur rub or gallop Lungs: Respirations are unlabored, there is symmetric air movement, scattered rhonchi and no wheezes  Abdomen: The abdomen is obese soft nontender without any overt organomegaly or masses. Musculoskeletal:  -edema and -tenderness Skin:  Intact  LABS:  BMET Recent Labs  Lab 11/19/17 0437 11/21/17 0953 11/22/17 0116  NA 137 138 140  K 3.6 3.6 3.4*  CL 107 110 111  CO2 22 23 23   BUN 11 12  16  CREATININE 0.54* 0.48* 0.61  GLUCOSE 176* 166* 198*    Electrolytes Recent Labs  Lab 11/19/17 0437 11/20/17 1633 11/21/17 0500 11/21/17 0953 11/22/17 0116  CALCIUM 8.5*  --   --  8.0* 8.2*  MG 1.6* 2.2 2.2  --  2.1  PHOS 2.9 3.0 2.8  --  2.9    CBC Recent Labs  Lab 11/19/17 0437 11/21/17 0953 11/22/17 0116  WBC 13.1* 9.6 10.7*  HGB 11.2* 10.2* 10.8*  HCT 35.2* 33.5* 34.6*  PLT 273 199 199    Coag's Recent Labs  Lab 11/18/17 0928 11/21/17 2311  APTT 31 34  INR 0.91 1.12    Sepsis Markers No results for input(s): LATICACIDVEN, PROCALCITON, O2SATVEN in the last 168 hours.  ABG Recent Labs  Lab 11/18/17 1450 11/19/17 0435 11/21/17 1001  PHART 7.291* 7.401 7.431  PCO2ART 53.1* 38.6 38.0  PO2ART 378.0* 124* 130.0*    Liver Enzymes Recent Labs  Lab 11/18/17 0928  AST 24  ALT 34  ALKPHOS 58  BILITOT 1.0  ALBUMIN 3.5    Cardiac Enzymes No results for input(s): TROPONINI, PROBNP in the last 168 hours.  Glucose Recent Labs  Lab 11/22/17 0413 11/22/17 0510 11/22/17 0606 11/22/17 0653 11/22/17 0754  11/22/17 0901  GLUCAP 144* 153* 138* 120* 160* 140*    Imaging Dg Chest Port 1 View  Result Date: 11/22/2017 CLINICAL DATA:  62 year old male with hypoxia. EXAM: PORTABLE CHEST 1 VIEW COMPARISON:  Chest radiograph dated 11/21/2017 FINDINGS: Endotracheal tube with tip approximately 5.6 cm above the carina. Enteric tube extends below the diaphragm with tip beyond the inferior margin of the image. There is cardiomegaly with increased vascular and interstitial prominence and probable small pleural effusions most consistent with CHF. Superimposed pneumonia is not excluded. Clinical correlation is recommended. There is no pneumothorax. Overall no significant change in the degree of congestion compared to the prior radiograph. No acute osseous pathology. IMPRESSION: Cardiomegaly with CHF and no significant interval change. Stable positioning of the support devices. Electronically Signed   By: Anner Crete M.D.   On: 11/22/2017 00:32   Dg Chest Port 1 View  Result Date: 11/21/2017 CLINICAL DATA:  Acute respiratory failure EXAM: PORTABLE CHEST 1 VIEW COMPARISON:  11/19/2017 FINDINGS: Cardiac shadow remains enlarged. Endotracheal tube and nasogastric catheter are again seen and stable. Lungs are well aerated bilaterally. Diffuse interstitial changes are noted consistent with some mild vascular congestion. No definitive interstitial edema is noted. No focal infiltrate or pneumothorax is seen. No bony abnormality is noted. IMPRESSION: Mild increasing congestive failure when compare with the prior exam. Electronically Signed   By: Inez Catalina M.D.   On: 11/21/2017 14:07        CULTURES: 11/18/2017 sputum>>  ANTIBIOTICS: 11/18/2017 Unasyn>> SIGNIFICANT EVENTS: 11/18/2017 intubated  LINES/TUBES: 11/18/2017 endotracheal tube>>  DISCUSSION: Mr. Gunderman  is a 62 year old male with a plethora of health issues who presented to [facility] on 11/18/2017 after contacting 911 when he noted slurred speech  and right facial.  He was transported to [facility] emergency department code stroke was initiated.  He had been on Eliquis in the past for paroxysmal atrial fibrillation have been stopped 3 weeks prior due to hemoptysis. 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  There was concern that TPA could not be given due to recent hemoptysis and erosion of the thyroid nodule into the airway. He was urgently intubated  by Dr. Nelda Marseille and erosion site was covered with the balloon cuff.  Patient was then given TPA and will be transferred to the neuro stroke unit. He will most likely remain intubated for period of 24 to 48 hours.  He will need further evaluation for most likely cancerous process in the neck and the lung.  ASSESSMENT / PLAN:  PULMONARY A: Vent dependent respiratory failure.  Unfortunately he is having recurrent episodes of hemoptysis and I will be asking ENT to evaluate the patient to see if there is something that we can do locally to limit bleeding into the trachea.  I have explained to family today that holding his antiplatelet agents is not an option as should he obstruct his basal artery stent it will likely be a lethal event.  Chronic obstructive pulmonary disease Former smoker Multiple lung nodules suspicious for cancer  Apparently the source of bleeding was 3 cm below the cords on FOB.   CARDIOVASCULAR A:  History of  Paroxysmal atrial fibrillation, currently off anticoagulation for recent hemoptysis 3 weeks duration I have added a low-dose of Coreg for rate control.   Coronary artery disease.  He is currently on dual antiplatelet therapy and I have introduced a statin. Peripheral vascular disease   RENAL  A:   No acute renal issues P:   Monitor electrolytes   Thyroid Mass The patient likely has metastatic thyroid cancer. The thyroid may have eroded into the trachea causing the  beleeding yesterday.Will need to have ENT see thepatient in the next few days. I have requested an US guided bx with the IR service not likerly to happen before Monday.   GASTROINTESTINAL A:   History of GERD P:   Proton pump inhibitor Gastric tube  Will start OGT feeds  HEMATOLOGIC A:   Chronic anticoagulation for atrial fibrillation Off anticoagulation for 3 weeks due to her hemoptysis As noted he continues to have hemoptysis, I cannot stop his dual antiplatelet therapy due to the risk of occluding his basilar artery stent.  I have asked ENT to evaluate the patient  INFECTIOUS A:   Observed aspiration P:   Placed on Unasyn Culture as needed  ENDOCRINE CBG (last 3)  Recent Labs    11/22/17 0653 11/22/17 0754 11/22/17 0901  GLUCAP 120* 160* 140*    A:   Diabetes mellitus P:   Sliding scale insulin  PAD The patient has a cool right foot. vascualr has seen the patient No intervention isd planned for now. Will montor. Reluctant to anticoagualt patient given the recent bleeding issues. 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 Discussed with neurology. 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 Can move the roight side fairly well. 6/30 Appears stable form a neurolgic point of view.   FAMILY  Situation discussed with family this morning.  Greater than 32 minutes was spent in the care of this patient today, who is suffering from hemoptysis with the potential for life-threatening deterioration requiring acute intervention  Lars Masson, MD [place]

## 2017-11-22 NOTE — Progress Notes (Signed)
PCCM Interval Note   Called to patient bedside for increased HR/BP. Bedside nurse reports that patient started coughing when this occurred. Patient was given Fentanyl and Metoprolol with improvement of HR and BP.  Patient with known erosion at level of cords. Bronchoscopy on 6/27 with noted bleeding 3 cm below the cords. About 11 am today patient coughed and ETT was pulled out, when RT advancing ETT patient with noted hemoptysis. Hemoptysis has not gotten worse since but has continued. INR 1.12. AM Labs and CXR pending.   Of note patient is on Brilinta and ASA. If hemoptysis worsens need to evaluate risk vs benefit of continuing, will discuss with Neurology.   Hayden Pedro, AGACNP-BC [facility]  Pgr: [phone]  PCCM Pgr: [phone]

## 2017-11-22 NOTE — Progress Notes (Signed)
eLink Physician-Brief Progress Note Patient Name: [name] [name] DOB: [birth year] MRN: [id number]   Date of Service  11/22/2017  HPI/Events of Note  Agitation - Request to increase bolus from infusion to 100 mcg.  eICU Interventions  Will order: 1. Increase Fentanyl bolus from infusion to 50-100 mcg Q 1 hour PRN.      Intervention Category Major Interventions: Other:  [name] 11/22/2017, 4:40 AM

## 2017-11-22 NOTE — Progress Notes (Signed)
STROKE TEAM PROGRESS NOTE   SUBJECTIVE (INTERVAL HISTORY) His girlfriend and nephew are at bedside. Pt had hemoptysis yesterday again and coughing with vent, was treated with fentanyl and precedex. Currently not synchronized with vent well, labored breathing with vent. More lethargic than yesterday, still able to follow commands on the right arm. ENT and CCM on board for possible trach and thyroid biopsy.     OBJECTIVE Temp:  [99.2 F (37.3 C)-99.7 F (37.6 C)] 99.2 F (37.3 C) (07/01 0400) Pulse Rate:  [30-200] 104 (07/01 0728) Cardiac Rhythm: Normal sinus rhythm (07/01 0400) Resp:  [12-34] 26 (07/01 0728) BP: (103-185)/(54-108) 103/68 (07/01 0728) SpO2:  [91 %-100 %] 100 % (07/01 0728) Arterial Line BP: (103-116)/(98-107) 103/98 (06/30 1400) FiO2 (%):  [40 %-60 %] 60 % (07/01 0728) Weight:  [152.7 kg (336 lb 10.3 oz)] 152.7 kg (336 lb 10.3 oz) (07/01 0500)  CBC:  Recent Labs  Lab 11/18/17 0928  11/19/17 0437 11/21/17 0953 11/22/17 0116  WBC 9.3  --  13.1* 9.6 10.7*  NEUTROABS 5.6  --  10.9*  --   --   HGB 13.6   < > 11.2* 10.2* 10.8*  HCT 42.5   < > 35.2* 33.5* 34.6*  MCV 89.1  --  89.6 91.8 91.8  PLT 276  --  273 199 199   < > = values in this interval not displayed.    Basic Metabolic Panel:  Recent Labs  Lab 11/21/17 0500 11/21/17 0953 11/22/17 0116  NA  --  138 140  K  --  3.6 3.4*  CL  --  110 111  CO2  --  23 23  GLUCOSE  --  166* 198*  BUN  --  12 16  CREATININE  --  0.48* 0.61  CALCIUM  --  8.0* 8.2*  MG 2.2  --  2.1  PHOS 2.8  --  2.9    Lipid Panel:     Component Value Date/Time   CHOL 110 11/19/2017 0437   CHOL 119 08/09/2017 1000   TRIG 340 (H) 11/19/2017 0437   HDL 19 (L) 11/19/2017 0437   HDL 29 (L) 08/09/2017 1000   CHOLHDL 5.8 11/19/2017 0437   VLDL 68 (H) 11/19/2017 0437   LDLCALC 23 11/19/2017 0437   LDLCALC 30 08/09/2017 1000   HgbA1c:  Lab Results  Component Value Date   HGBA1C 9.1 (H) 11/19/2017   Urine Drug Screen: No  results found for: LABOPIA, COCAINSCRNUR, LABBENZ, AMPHETMU, THCU, LABBARB  Alcohol Level No results found for: [place] I have personally reviewed the radiological images below and agree with the radiology interpretations.  Ct Angio Head W Or Wo Contrast  Ct Angio Neck W Or Wo Contrast 11/18/2017 IMPRESSION:  Since the study of earlier today, there are only 2 changes. There is new consolidation and volume loss in the right upper lobe. 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 Flow is seen distal to that however, presumably indicating patent posterior communicating arteries.   Ct Angio Head W Or Wo Contrast Ct Angio Neck W Or Wo Contrast 11/18/2017 IMPRESSION:  1. Severe intracranial and extracranial atherosclerotic disease. No emergent large vessel occlusion  2. Severe stenosis right carotid bifurcation due to calcified plaque, estimated 90% diameter stenosis. No significant left carotid stenosis. Severe stenosis cavernous carotid bilaterally.  3. Both vertebral arteries are patent in the neck with severe calcific stenosis V4 segment bilaterally.  4. 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  Small calcified lymph nodes left neck suspicious for metastatic disease  5. Small lung nodules on the right, possible metastatic disease. Recommend chest CT for further evaluation.   Ct Head Wo Contrast 11/19/2017 IMPRESSION:  1. Expected evolution of brainstem infarct without hemorrhage.  2. No new infarcts.  3. Left vertebral artery and basilar artery stent.  4. Acute on chronic sinus disease may be related to intubation.   Ct Chest W Contrast 11/18/2017 IMPRESSION Right upper lobe airspace opacity is noted with air bronchograms concerning for pneumonia or possibly atelectasis. Stable mediastinal adenopathy is noted compared to prior exam. Endotracheal and nasogastric tubes are in grossly good position. Coronary artery calcifications are noted suggesting coronary  artery disease. Possible 2.8 cm left thyroid nodule. Thyroid ultrasound is recommended for further evaluation. Aortic Atherosclerosis (ICD10-I70.0).   Mr Brain Wo Contrast 11/19/2017 IMPRESSION:  1. 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 No associated hemorrhage or significant mass effect.  2. Additional punctate acute/early subacute infarctions within left lateral cerebellum and right parietal cortex.  3. Minimal chronic microvascular ischemic changes of the brain and mild parenchymal volume loss.   Ct Head Code Stroke Wo Contrast 11/18/2017 IMPRESSION:  1. No acute intracranial abnormality.  Atherosclerotic calcification  2. ASPECTS is 10   Cerebral Angiogram / Stent  11/18/2017 1. 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 Occluded non dominant RT VBJ. 2. S/P endovascular revascularization of the distal basilar artery  To 90 % patency , and of 30 to 40 % of the LT VBJ with stent placement. Dominant  Lt Pcom.  Transthoracic Echocardiogram - Left ventricle: The cavity size was moderately dilated. Wall   thickness was increased in a pattern of severe LVH. Systolic   function was normal. The estimated ejection fraction was in the   range of 55% to 60%. Wall motion was normal; there were no   regional wall motion abnormalities. The study is not technically   sufficient to allow evaluation of LV diastolic function. - Aortic valve: Trileaflet. Sclerosis without stenosis. There was   no regurgitation. Valve area (Vmax): 2.88 cm^2. - Aorta: Aortic root ML diameter: 41.95 mm (ED). - Aortic root: The aortic root is mildly dilated. - Mitral valve: Calcified annulus. There was trivial regurgitation. - Left atrium: The atrium was mildly dilated. - Right ventricle: The cavity size was mildly dilated. Mildly   reduced systolic function. -  Inferior vena cava: The vessel was normal in size. The   respirophasic diameter changes were in the normal range (>= 50%),   consistent with normal central venous pressure. Impressions: - Compared to a prior study in 02/2017, there have been no   significant changes.  Dg Chest Port 1 View  Result Date: 11/22/2017 CLINICAL DATA:  62 year old male with hypoxia. EXAM: PORTABLE CHEST 1 VIEW COMPARISON:  Chest radiograph dated 11/21/2017 FINDINGS: Endotracheal tube with tip approximately 5.6 cm above the carina. Enteric tube extends below the diaphragm with tip beyond the inferior margin of the image. There is cardiomegaly with increased vascular and interstitial prominence and probable small pleural effusions most consistent with CHF. Superimposed pneumonia is not excluded. Clinical correlation is recommended. There is no pneumothorax. Overall no significant change in the degree of congestion compared to the prior radiograph. No acute osseous pathology. IMPRESSION: Cardiomegaly with CHF and no significant interval change. Stable positioning of the  support devices. Electronically Signed   By: Anner Crete M.D.   On: 11/22/2017 00:32     PHYSICAL EXAM  Temp:  [99.2 F (37.3 C)-99.7 F (37.6 C)] 99.2 F (37.3 C) (07/01 0400) Pulse Rate:  [30-200] 104 (07/01 0728) Resp:  [12-34] 26 (07/01 0728) BP: (103-185)/(54-108) 103/68 (07/01 0728) SpO2:  [91 %-100 %] 100 % (07/01 0728) Arterial Line BP: (103-116)/(98-107) 103/98 (06/30 1400) FiO2 (%):  [40 %-60 %] 60 % (07/01 0728) Weight:  [152.7 kg (336 lb 10.3 oz)] 152.7 kg (336 lb 10.3 oz) (07/01 0500)  General - morbid obesity, well developed, intubated.  Ophthalmologic - fundi not visualized due to noncooperation.  Cardiovascular - irregularly irregular heart rate and rhythm, afib RVR.  Neuro - lethargic, eyes closed but able to open on voice, intubated on sedation, following simple commands on the right arm, but not right LE. PERRL, upward  gaze with nystagmus, downward gaze symmetrical, left gaze symmetrical but right gaze palsy on the right. Blinking to visual threat bilaterally. Facial symmetry not able to test due to ET tube, not able to move tongue or mouth on command. Weak corneal reflexes bilaterally, but positive gag and cough. R hand 3-/5 finger movement, otherwise no spontaneous movement. RUE and RLE 1/5 and LUE and LLE 0/5 with pain, DTR diminished and no babinski. Sensation, coordination and gait not tested.    ASSESSMENT/PLAN Mr. Koren Sermersheim is a 62 y.o. male with PMH of DM, hemoptysis, OSA, HTN, HLD, CAD, afib off Eliquis due to hemoptysis presenting with slurred speech, right sided weakness. Was intubated for hemoptysis prevention while giving tPA. Symptoms worsened and imaging confirmed BA occlusion. S/p IR with revascularization of BA as well as left VA s/p stenting.  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   MRI head - large acute/early subacute infarction within the central pons involving both sides, greater on the right.  CTA H&N - severe vascular athero including BA stenosis, b/l VA stenosis, right ICA proximal 90% stenosis, b/l cavernous ICA stenosis R>L.  Repeat CTA H/N - new distal BA occlusion  DSA - revascularization of the distal basilar artery  To 90 % patency , and of 30 to 40 % of the LT VBJ with stent placement.  2D Echo - EF 55-60%  LDL - 23  HgbA1c - 9.1  VTE prophylaxis - SCDs  Eliquis (recently stopped) and plavix prior to admission, now on aspirin 81 mg daily and Brilinta . Hold off AC  for now due to hemoptysis.   Ongoing aggressive stroke risk factor management  Therapy recommendations:  pending  Disposition:  Pending  Thyroid mass  CTA neck - right large thyroid mass compressing on trachea.   Thyroid mass larger than CT chest in 08/2016  Evidence of tracheal erosion on bronch at this admission  Pending thyroid biopsy with ENT or IR once more stabilized  Based on thyroid biopsy, will consider oncology consult  Respiratory failure  CCM on board  Current intubated on sedation  no cuff leak  desynchronized with vent  ENT and CCM to decide on trach. 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 Long term BP goal normotensive.  Hyperlipidemia  Lipid lowering medication PTA: Crestor 10 mg daily  LDL 23, goal < 70  On Crestor  Continue statin at discharge  Diabetes  HgbA1c 9.1, goal < 7.0  Uncontrolled  On insulin drip, CBG stable   CBG monitoring  CCM on board  Other Stroke Risk Factors  Advanced age  Former cigarette smoker - quit, 40 pack year history  Morbid obesity, Body mass index is 48.3 kg/m.    Hx stroke/TIA  Coronary  artery disease   PAD with claudication  OSA on CPAP  Other Active Problems  Leukocytosis WBC 13.1   Hospital day # 4  This patient is critically ill due to stroke, PAF, thyroid mass, hemoptysis, severe vascular athero and at significant risk of neurological worsening, death form recurrent stroke, hemorrhagic conversion, heart failure, bleeding, shock. This patient's care requires constant monitoring of vital signs, hemodynamics, respiratory and cardiac monitoring, review of multiple databases, neurological assessment, discussion with family, other specialists and medical decision making of high complexity. I spent 35 minutes of neurocritical care time in the care of this patient. I had long discussion with girlfriend at bedside, updated pt current condition, treatment plan and potential prognosis. She expressed understanding and appreciation. I also discussed with Dr. Dominica Severin.  Rosalin Hawking, MD PhD Stroke Neurology 11/22/2017 10:28 AM   To contact Stroke Continuity provider, please refer to [email or web address] After hours, contact General Neurology

## 2017-11-23 ENCOUNTER — Inpatient Hospital Stay (HOSPITAL_COMMUNITY): Payer: 59

## 2017-11-23 LAB — GLUCOSE, CAPILLARY
GLUCOSE-CAPILLARY: 136 mg/dL — AB (ref 70–99)
GLUCOSE-CAPILLARY: 138 mg/dL — AB (ref 70–99)
GLUCOSE-CAPILLARY: 144 mg/dL — AB (ref 70–99)
GLUCOSE-CAPILLARY: 148 mg/dL — AB (ref 70–99)
GLUCOSE-CAPILLARY: 150 mg/dL — AB (ref 70–99)
GLUCOSE-CAPILLARY: 161 mg/dL — AB (ref 70–99)
GLUCOSE-CAPILLARY: 166 mg/dL — AB (ref 70–99)
GLUCOSE-CAPILLARY: 171 mg/dL — AB (ref 70–99)
GLUCOSE-CAPILLARY: 188 mg/dL — AB (ref 70–99)
GLUCOSE-CAPILLARY: 202 mg/dL — AB (ref 70–99)
Glucose-Capillary: 133 mg/dL — ABNORMAL HIGH (ref 70–99)
Glucose-Capillary: 166 mg/dL — ABNORMAL HIGH (ref 70–99)
Glucose-Capillary: 189 mg/dL — ABNORMAL HIGH (ref 70–99)
Glucose-Capillary: 167 mg/dL — ABNORMAL HIGH (ref 70–99)
Glucose-Capillary: 195 mg/dL — ABNORMAL HIGH (ref 70–99)
Glucose-Capillary: 208 mg/dL — ABNORMAL HIGH (ref 70–99)
Glucose-Capillary: 192 mg/dL — ABNORMAL HIGH (ref 70–99)
Glucose-Capillary: 176 mg/dL — ABNORMAL HIGH (ref 70–99)
Glucose-Capillary: 191 mg/dL — ABNORMAL HIGH (ref 70–99)
Glucose-Capillary: 209 mg/dL — ABNORMAL HIGH (ref 70–99)
Glucose-Capillary: 180 mg/dL — ABNORMAL HIGH (ref 70–99)
Glucose-Capillary: 172 mg/dL — ABNORMAL HIGH (ref 70–99)

## 2017-11-23 LAB — BASIC METABOLIC PANEL
ANION GAP: 5 (ref 5–15)
BUN: 17 mg/dL (ref 8–23)
CALCIUM: 7.4 mg/dL — AB (ref 8.9–10.3)
CO2: 22 mmol/L (ref 22–32)
Chloride: 115 mmol/L — ABNORMAL HIGH (ref 98–111)
Creatinine, Ser: 0.62 mg/dL (ref 0.61–1.24)
Glucose, Bld: 180 mg/dL — ABNORMAL HIGH (ref 70–99)
Potassium: 3.5 mmol/L (ref 3.5–5.1)
Sodium: 142 mmol/L (ref 135–145)

## 2017-11-23 LAB — CULTURE, RESPIRATORY W GRAM STAIN: Culture: NORMAL

## 2017-11-23 LAB — CBC
HCT: 29.5 % — ABNORMAL LOW (ref 39.0–52.0)
Hemoglobin: 8.8 g/dL — ABNORMAL LOW (ref 13.0–17.0)
MCH: 28 pg (ref 26.0–34.0)
MCHC: 29.8 g/dL — ABNORMAL LOW (ref 30.0–36.0)
MCV: 93.9 fL (ref 78.0–100.0)
PLATELETS: 199 10*3/uL (ref 150–400)
RBC: 3.14 MIL/uL — ABNORMAL LOW (ref 4.22–5.81)
RDW: 13.4 % (ref 11.5–15.5)
WBC: 8.2 10*3/uL (ref 4.0–10.5)

## 2017-11-23 LAB — BLOOD GAS, ARTERIAL
ACID-BASE DEFICIT: 1.3 mmol/L (ref 0.0–2.0)
BICARBONATE: 22.7 mmol/L (ref 20.0–28.0)
DRAWN BY: 44166
FIO2: 40
LHR: 26 {breaths}/min
MECHVT: 580 mL
O2 Saturation: 96.5 %
PEEP/CPAP: 8 cmH2O
Patient temperature: 102
pCO2 arterial: 40.6 mmHg (ref 32.0–48.0)
pH, Arterial: 7.377 (ref 7.350–7.450)
pO2, Arterial: 97.6 mmHg (ref 83.0–108.0)

## 2017-11-23 LAB — PROCALCITONIN: PROCALCITONIN: 0.12 ng/mL

## 2017-11-23 MED ORDER — SODIUM CHLORIDE 0.9 % IV SOLN
1.0000 g | Freq: Two times a day (BID) | INTRAVENOUS | Status: DC
  Administered 2017-11-23 – 2017-11-26 (×8): 1 g via INTRAVENOUS
  Filled 2017-11-23 (×9): qty 1

## 2017-11-23 MED ORDER — HEPARIN SODIUM (PORCINE) 5000 UNIT/ML IJ SOLN
5000.0000 [IU] | Freq: Two times a day (BID) | INTRAMUSCULAR | Status: DC
  Administered 2017-11-23 – 2017-11-29 (×13): 5000 [IU] via SUBCUTANEOUS
  Filled 2017-11-23 (×14): qty 1

## 2017-11-23 MED ORDER — FAMOTIDINE IN NACL 20-0.9 MG/50ML-% IV SOLN
20.0000 mg | Freq: Two times a day (BID) | INTRAVENOUS | Status: DC
  Administered 2017-11-23 (×2): 20 mg via INTRAVENOUS
  Filled 2017-11-23 (×2): qty 50

## 2017-11-23 MED ORDER — CHLORHEXIDINE GLUCONATE CLOTH 2 % EX PADS
6.0000 | MEDICATED_PAD | Freq: Every day | CUTANEOUS | Status: DC
  Administered 2017-11-23 – 2017-12-10 (×16): 6 via TOPICAL

## 2017-11-23 NOTE — Progress Notes (Signed)
STROKE TEAM PROGRESS NOTE   SUBJECTIVE (INTERVAL HISTORY) His girlfriend and nephew are at bedside. Pt was sedated at this time due to de-synchronization of vent. Now calm but not following commands due to sedation. ENT has concern with brilinta for trach. Discussed with Dr. Estanislado Pandy, he does not think brilinta is contraindicated for trach but if needed can stop with heparin bridge.     OBJECTIVE Temp:  [99.2 F (37.3 C)-102.4 F (39.1 C)] 102.3 F (39.1 C) (07/02 1030) Pulse Rate:  [26-108] 41 (07/02 1100) Cardiac Rhythm: Atrial fibrillation (07/02 0800) Resp:  [19-30] 30 (07/02 1100) BP: (88-181)/(60-117) 123/73 (07/02 1100) SpO2:  [90 %-100 %] 96 % (07/02 1100) FiO2 (%):  [40 %-60 %] 40 % (07/02 0737) Weight:  [340 lb 13.3 oz (154.6 kg)] 340 lb 13.3 oz (154.6 kg) (07/02 0400)  CBC:  Recent Labs  Lab 11/18/17 0928  11/19/17 0437  11/22/17 0116 11/23/17 0641  WBC 9.3  --  13.1*   < > 10.7* 8.2  NEUTROABS 5.6  --  10.9*  --   --   --   HGB 13.6   < > 11.2*   < > 10.8* 8.8*  HCT 42.5   < > 35.2*   < > 34.6* 29.5*  MCV 89.1  --  89.6   < > 91.8 93.9  PLT 276  --  273   < > 199 199   < > = values in this interval not displayed.    Basic Metabolic Panel:  Recent Labs  Lab 11/21/17 0500  11/22/17 0116 11/23/17 0641  NA  --    < > 140 142  K  --    < > 3.4* 3.5  CL  --    < > 111 115*  CO2  --    < > 23 22  GLUCOSE  --    < > 198* 180*  BUN  --    < > 16 17  CREATININE  --    < > 0.61 0.62  CALCIUM  --    < > 8.2* 7.4*  MG 2.2  --  2.1  --   PHOS 2.8  --  2.9  --    < > = values in this interval not displayed.    Lipid Panel:     Component Value Date/Time   CHOL 110 11/19/2017 0437   CHOL 119 08/09/2017 1000   TRIG 340 (H) 11/19/2017 0437   HDL 19 (L) 11/19/2017 0437   HDL 29 (L) 08/09/2017 1000   CHOLHDL 5.8 11/19/2017 0437   VLDL 68 (H) 11/19/2017 0437   LDLCALC 23 11/19/2017 0437   LDLCALC 30 08/09/2017 1000   HgbA1c:  Lab Results  Component Value  Date   HGBA1C 9.1 (H) 11/19/2017   Urine Drug Screen: No results found for: LABOPIA, COCAINSCRNUR, LABBENZ, AMPHETMU, THCU, LABBARB  Alcohol Level No results found for: [place] I have personally reviewed the radiological images below and agree with the radiology interpretations.  Ct Angio Head W Or Wo Contrast  Ct Angio Neck W Or Wo Contrast 11/18/2017 IMPRESSION:  Since the study of earlier today, there are only 2 changes. There is new consolidation and volume loss in the right upper lobe. 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 Flow is seen distal to that however, presumably indicating patent posterior communicating arteries.   Ct Angio Head W Or Wo Contrast Ct Angio Neck W Or Wo Contrast 11/18/2017 IMPRESSION:  1. Severe  intracranial and extracranial atherosclerotic disease. No emergent large vessel occlusion  2. Severe stenosis right carotid bifurcation due to calcified plaque, estimated 90% diameter stenosis. No significant left carotid stenosis. Severe stenosis cavernous carotid bilaterally.  3. Both vertebral arteries are patent in the neck with severe calcific stenosis V4 segment bilaterally.  4. 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 Small calcified lymph nodes left neck suspicious for metastatic disease  5. Small lung nodules on the right, possible metastatic disease. Recommend chest CT for further evaluation.   Ct Head Wo Contrast 11/19/2017 IMPRESSION:  1. Expected evolution of brainstem infarct without hemorrhage.  2. No new infarcts.  3. Left vertebral artery and basilar artery stent.  4. Acute on chronic sinus disease may be related to intubation.   Ct Chest W Contrast 11/18/2017 IMPRESSION Right upper lobe airspace opacity is noted with air bronchograms concerning for pneumonia or possibly atelectasis. Stable mediastinal adenopathy is noted compared to prior exam. Endotracheal and nasogastric tubes are in grossly good position.  Coronary artery calcifications are noted suggesting coronary artery disease. Possible 2.8 cm left thyroid nodule. Thyroid ultrasound is recommended for further evaluation. Aortic Atherosclerosis (ICD10-I70.0).   Mr Brain Wo Contrast 11/19/2017 IMPRESSION:  1. 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 No associated hemorrhage or significant mass effect.  2. Additional punctate acute/early subacute infarctions within left lateral cerebellum and right parietal cortex.  3. Minimal chronic microvascular ischemic changes of the brain and mild parenchymal volume loss.   Ct Head Code Stroke Wo Contrast 11/18/2017 IMPRESSION:  1. No acute intracranial abnormality.  Atherosclerotic calcification  2. ASPECTS is 10   Cerebral Angiogram / Stent  11/18/2017 1. 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 Occluded non dominant RT VBJ. 2. S/P endovascular revascularization of the distal basilar artery  To 90 % patency , and of 30 to 40 % of the LT VBJ with stent placement. Dominant  Lt Pcom.  Transthoracic Echocardiogram - Left ventricle: The cavity size was moderately dilated. Wall   thickness was increased in a pattern of severe LVH. Systolic   function was normal. The estimated ejection fraction was in the   range of 55% to 60%. Wall motion was normal; there were no   regional wall motion abnormalities. The study is not technically   sufficient to allow evaluation of LV diastolic function. - Aortic valve: Trileaflet. Sclerosis without stenosis. There was   no regurgitation. Valve area (Vmax): 2.88 cm^2. - Aorta: Aortic root ML diameter: 41.95 mm (ED). - Aortic root: The aortic root is mildly dilated. - Mitral valve: Calcified annulus. There was trivial regurgitation. - Left atrium: The atrium was mildly dilated. - Right ventricle: The cavity size  was mildly dilated. Mildly   reduced systolic function. - Inferior vena cava: The vessel was normal in size. The   respirophasic diameter changes were in the normal range (>= 50%),   consistent with normal central venous pressure. Impressions: - Compared to a prior study in 02/2017, there have been no   significant changes.  Dg Chest Port 1 View  Result Date: 11/22/2017 CLINICAL DATA:  62 year old male with hypoxia. EXAM: PORTABLE CHEST 1 VIEW COMPARISON:  Chest radiograph dated 11/21/2017 FINDINGS: Endotracheal tube with tip approximately 5.6 cm above the carina. Enteric tube extends below the diaphragm with tip beyond the inferior margin  of the image. There is cardiomegaly with increased vascular and interstitial prominence and probable small pleural effusions most consistent with CHF. Superimposed pneumonia is not excluded. Clinical correlation is recommended. There is no pneumothorax. Overall no significant change in the degree of congestion compared to the prior radiograph. No acute osseous pathology. IMPRESSION: Cardiomegaly with CHF and no significant interval change. Stable positioning of the support devices. Electronically Signed   By: Anner Crete M.D.   On: 11/22/2017 00:32     PHYSICAL EXAM  Temp:  [99.2 F (37.3 C)-102.4 F (39.1 C)] 102.3 F (39.1 C) (07/02 1030) Pulse Rate:  [26-108] 41 (07/02 1100) Resp:  [19-30] 30 (07/02 1100) BP: (88-181)/(60-117) 123/73 (07/02 1100) SpO2:  [90 %-100 %] 96 % (07/02 1100) FiO2 (%):  [40 %-60 %] 40 % (07/02 0737) Weight:  [340 lb 13.3 oz (154.6 kg)] 340 lb 13.3 oz (154.6 kg) (07/02 0400)  General - morbid obesity, well developed, intubated on heavy sedation.  Ophthalmologic - fundi not visualized due to noncooperation.  Cardiovascular - irregularly irregular heart rate and rhythm, afib RVR.  Neuro - intubated on sedation, not following simple commands. Eyes closed, pinpoint pupils, not reactive to light, eye midposition, sluggish  dolls eyes and no corneal or gag on exam. Not blinking to visual threat bilaterally. Facial symmetry not able to test due to ET tube, not able to move tongue or mouth on command. No movement on all extremities, DTR diminished and no babinski. Sensation, coordination and gait not tested.    ASSESSMENT/PLAN Mr. Jeanne Terrance is a 62 y.o. male with PMH of DM, hemoptysis, OSA, HTN, HLD, CAD, afib off Eliquis due to hemoptysis presenting with slurred speech, right sided weakness. Was intubated for hemoptysis prevention while giving tPA. Symptoms worsened and imaging confirmed BA occlusion. S/p IR with revascularization of BA as well as left VA s/p stenting.  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   MRI head - large acute/early subacute infarction within the central pons involving both sides, greater on the right.  CTA H&N - severe vascular athero including BA stenosis, b/l VA stenosis, right ICA proximal 90% stenosis, b/l cavernous ICA stenosis R>L.  Repeat CTA H/N - new distal BA occlusion  DSA - revascularization of the distal basilar artery  To 90 % patency , and of 30 to 40 % of the LT VBJ with stent placement.  2D Echo - EF 55-60%  LDL - 23  HgbA1c - 9.1  VTE prophylaxis - SCDs  Eliquis (recently stopped) and plavix prior to admission, now on aspirin 81 mg daily and Brilinta . Hold off AC for now due to hemoptysis.   Ongoing aggressive stroke risk factor management  Therapy recommendations:  pending  Disposition:  Pending  Thyroid mass  CTA neck - right large thyroid mass  compressing on trachea.   Thyroid mass larger than CT chest in 08/2016  Evidence of tracheal erosion on bronch at this admission  Pending thyroid biopsy with ENT or IR once more stabilized  Based on thyroid biopsy, will consider oncology consult  Respiratory failure  CCM on board  Current intubated on sedation  no cuff leak  Likely need trach  Do not think brilinta is a contraindication for trach  Discussed with Dr. Estanislado Pandy, if absolutely needed, can consider stop brilinta for trach with heparin bridge.  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   Hypertension  Stable   Now on coreg bid . Long term BP goal normotensive.  Hyperlipidemia  Lipid lowering medication PTA: Crestor 10 mg daily  LDL 23, goal < 70  On Crestor  Continue statin at discharge  Diabetes  HgbA1c 9.1, goal < 7.0  Uncontrolled  On insulin drip, CBG stable   CBG monitoring  CCM on board  Other Stroke Risk Factors  Advanced age  Former cigarette smoker - quit, 40 pack year history  Morbid obesity, Body mass index is 48.9 kg/m.    Hx  stroke/TIA  Coronary artery disease   PAD with claudication  OSA on CPAP  Other Active Problems  Leukocytosis WBC 13.1   Hospital day # 5  This patient is critically ill due to stroke, PAF, thyroid mass, hemoptysis, fever, severe vascular athero and at significant risk of neurological worsening, death form recurrent stroke, hemorrhagic conversion, heart failure, bleeding, shock. This patient's care requires constant monitoring of vital signs, hemodynamics, respiratory and cardiac monitoring, review of multiple databases, neurological assessment, discussion with family, other specialists and medical decision making of high complexity. I spent 35 minutes of neurocritical care time in the care of this patient. I had long discussion with girlfriend at bedside, updated pt current condition, treatment plan and potential prognosis. She expressed understanding and appreciation. I also discussed with Dr. Dominica Severin.  Rosalin Hawking, MD PhD Stroke Neurology 11/23/2017 11:22 AM   To contact Stroke Continuity provider, please refer to [email or web address] After hours, contact General Neurology

## 2017-11-23 NOTE — Progress Notes (Signed)
PT Cancellation Note  Patient Details Name: [name] [name] MRN: [id number] DOB: 12/28/1955   Cancelled Treatment:    Reason Eval/Treat Not Completed: Patient not medically ready.  Pt's cardiopulmonary status not appropriate for therapy.  Will see 7/3 as able. 11/23/2017  [name] [name], [place] [phone]  (pager)   [name] [name] 11/23/2017, 12:06 PM

## 2017-11-23 NOTE — Progress Notes (Signed)
OT Cancellation Note  Patient Details Name: Clements Toro MRN: [id number] DOB: 09-Jun-1955   Cancelled Treatment:    Reason Eval/Treat Not Completed: Patient not medically ready Spoke with RN staff and patient is not medically ready  Vonita Moss   OTR/L Pager: [phone] Office: [phone] .  11/23/2017, 7:37 AM

## 2017-11-23 NOTE — Care Management Note (Signed)
Case Management Note  Patient Details  Name: [name] [name] MRN: [id number] Date of Birth: Jun 23, 1955  Subjective/Objective:  Pt admitted on 11/18/17 with stroke, thyroid mass, and respiratory failure.  PTA, pt independent; has significant other, Curt Bears.                    Action/Plan: Pt currently remains sedated and intubated.  Will continue to follow for discharge planning as pt progresses.  Expected Discharge Date:                  Expected Discharge Plan:     In-House Referral:     Discharge planning Services  CM Consult  Post Acute Care Choice:    Choice offered to:     DME Arranged:    DME Agency:     HH Arranged:    HH Agency:     Status of Service:  In process, will continue to follow  If discussed at Long Length of Stay Meetings, dates discussed:    Additional Comments:  Reinaldo Raddle, RN, BSN  Trauma/Neuro ICU Case Manager [phone]

## 2017-11-23 NOTE — Progress Notes (Signed)
Nutrition Follow-up  DOCUMENTATION CODES:   Morbid obesity  INTERVENTION:  Continue via OG tube: Vital High Protein @ 65 ml/h (1560 ml per day)  30 ml Prostat TID  Provides:  1860 kcals, 181 gm protein, 1310 ml free water daily.  NUTRITION DIAGNOSIS:   Inadequate oral intake related to inability to eat as evidenced by NPO status; ongoing  GOAL:   Provide needs based on ASPEN/SCCM guidelines; ongoing  MONITOR:   I & O's  ASSESSMENT:   Pt with PMH of vascular dx, CAD, COPD, GERD, HLD, HTN, OSA, afib (off anticoagulation x 3 weeks due to hemoptysis), and DM admitted 6/27 with R MCA stroke s/p tPA and revascularization x 2 on 6/27. Pt noted to have a large thyroid mass and and multiple lung nodules suspicious for metastatic thyroid cancer. Also noted coolness of R distal leg, vascular has deferred interventions for now.   Pt discussed during ICU rounds and with RN.  Pt with high fevers overnight per MD CNS vs drug fever Per CCM ENT and thoracic surgery has been consulted for possible biopsy, trach  Patient is currently intubated on ventilator support MV: 17.2 L/min Temp (24hrs), Avg:101.5 F (38.6 C), Min:99.2 F (37.3 C), Max:102.4 F (39.1 C)  Labs and medications reviewed Pt is 15.8 L positive; weight up 28 lb from admission weight of 312 lb/141.6 kg UOP: 951 ml  Diet Order:   Diet Order           Diet NPO time specified  Diet effective now          EDUCATION NEEDS:   No education needs have been identified at this time  Skin:  Skin Assessment: Reviewed RN Assessment  Last BM:  6/30  Height:   Ht Readings from Last 1 Encounters:  11/18/17 5\' 10"  (1.778 m)    Weight:   Wt Readings from Last 1 Encounters:  11/23/17 (!) 340 lb 13.3 oz (154.6 kg)    Ideal Body Weight:  75.4 kg  BMI:  Body mass index is 48.9 kg/m.  Estimated Nutritional Needs:   Kcal:  7867-5449  Protein:  151-188 grams  Fluid:  2 L/day  Maylon Peppers RD, LDN,  CNSC [phone] Pager [phone] After Hours Pager

## 2017-11-23 NOTE — Progress Notes (Signed)
Titrated sedation and pain medication down throughout the morning into the early afternoon. Patient became very tachypnic, tachycardic, and hypertensive. MD aware. PRN's given and medications titrated up. Patient still febrile (102.42F). He will open eyes at times but will not follow commands or make any purposeful movements. Takina Busser, Rande Brunt, RN

## 2017-11-23 NOTE — Progress Notes (Signed)
RT came to bedside to give a neb treatment.  Pt looks labored with increased peak pressures.  RT attempted  to suction pt's endotracheal tube but the suction catheter would not pass.  RT bag lavaged pt and removed a large plug.  Pt's peak pressures dropped back to upper 20's.  Pt's no longer labored.  Good return tidal volumes.  RN and family at bedside.

## 2017-11-23 NOTE — Progress Notes (Signed)
PULMONARY / CRITICAL CARE MEDICINE   Name: [name] [name] MRN: [id number] DOB: 17-Jan-1956    ADMISSION DATE:  11/18/2017 CONSULTATION DATE: 11/18/2017  REFERRING MD: Emergency department  CHIEF COMPLAINT: Stroke  HISTORY OF PRESENT ILLNESS:    [name] [name]  is a 62 year old male with a plethora of health issues who presented to [facility] on 11/18/2017 after contacting 911 when he noted slurred speech and right facial.  He was transported to [facility] emergency department code stroke was initiated.  He had been on Eliquis in the past for paroxysmal atrial fibrillation have been stopped 3 weeks prior due to hemoptysis. 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  There was concern that TPA could not be given due to recent hemoptysis and erosion of the thyroid nodule into the airway. He was urgently intubated by Dr. Nelda Marseille and erosion site was covered with the balloon cuff.  Patient was then given TPA and will be transferred to the neuro stroke unit. He will most likely remain intubated for period of 24 to 48 hours.  He will need further evaluation for most likely cancerous process in the neck and the lung.  6/28 The patient is on a ventialtor in the ICU. He had an intervention on the basilar artery and left vertebrobasilar jn.A stent is in place He developed coolness and pallor in the right distal leg. The foot is cool.He has been seen by the vacuar service. The patient was dicovered to have a large thyroid mass with adenopathy that likely represents a thyroid cancer. The patient was apparently not aware of this finding preadmission.  6/29 The patient is off all sedation presently. He is responsive and able to follow commands. Neurology and I  had a long discussion with his family and girlfriend about his condition and plans. The patient has stable vitals. He did not have  A cuff leak this  AM.  6/30 The patient is awake and neurologically stable. He does not have much of an air leak when the cuff is deflated. If this persists the patient will likely need a tracheostomy. I believe we need a biopsy as we need to have some therapy to help shrink the thyroid and nodes if this is in fact a metastatic thyroid cancer.  7/1 there was some hemoptysis again last night.  Respiratory continues to suction some bloody secretions from the endotracheal tube today.  He is sedated on a combination of fentanyl and Versed but does follow simple instructions for me today.  7/2 no further hemoptysis overnight.  He is required heavy sedation to maintain ventilator synchrony.  He is not at all interactive when initially came into by me this morning.        PAST MEDICAL HISTORY :  He  has a past medical history of Collagen vascular disease ([place]), Coronary artery disease, GERD (gastroesophageal reflux disease), High cholesterol, Hypertension, OSA (obstructive sleep apnea), Pneumonia (2011), and Type II diabetes mellitus ([place]) (07/2016).  PAST SURGICAL HISTORY: He  has a past surgical history that includes Video bronchoscopy (Bilateral, 08/25/2016); Lower Extremity Angiography (Bilateral, 04/01/2017); PERIPHERAL VASCULAR INTERVENTION (Left, 04/01/2017); Coronary angioplasty with stent (2011; 07/2016); Radiology with anesthesia (N/A, 11/18/2017); IR ANGIO EXTRACRAN SEL COM CAROTID INNOMINATE UNI BILAT MOD SED (11/18/2017); IR ANGIO VERTEBRAL SEL SUBCLAVIAN INNOMINATE UNI R MOD SED (11/18/2017); and IR Intra Cran Stent (11/18/2017).  No Known Allergies  No current facility-administered medications on file prior  to encounter.    Current Outpatient Medications on File Prior to Encounter  Medication Sig  . apixaban (ELIQUIS) 5 MG TABS tablet Take 1 tablet (5 mg total) 2 (two) times daily by mouth.  . dronedarone (MULTAQ) 400 MG tablet Take 1 tablet (400 mg total) 2 (two) times daily with a meal by mouth.  .  irbesartan (AVAPRO) 75 MG tablet TAKE ONE TABLET BY MOUTH ONE TIME DAILY   . liraglutide (VICTOZA) 18 MG/3ML SOPN Inject 0.2 mLs (1.2 mg total) into the skin daily at 10 pm. Start with 0.6 mg for one week, then increase to 1.2 mg daily.  . metoprolol tartrate (LOPRESSOR) 50 MG tablet Take 1 tablet (50 mg total) by mouth 2 (two) times daily.  . Omega-3 Fatty Acids (FISH OIL) 1000 MG CAPS Take 2 capsules (2,000 mg total) by mouth 2 (two) times daily.  . pantoprazole (PROTONIX) 40 MG tablet TAKE 1 TABLET BY MOUTH ONCE A DAY 30 TO 60 MINUTES BEFORE FIRST MEAL OF THE DAY   . B Complex Vitamins (VITAMIN B COMPLEX PO) Take 1 capsule at bedtime by mouth.   . clopidogrel (PLAVIX) 75 MG tablet Take 1 tablet (75 mg total) by mouth daily. (Patient not taking: Reported on 11/20/2017)  . famotidine (PEPCID) 20 MG tablet One at bedtime  . fenofibrate 160 MG tablet Take 1 tablet (160 mg total) by mouth daily.  . metFORMIN (GLUCOPHAGE) 1000 MG tablet Take 1,000 mg by mouth 2 (two) times daily with a meal.  . nitroGLYCERIN (NITROSTAT) 0.4 MG SL tablet Place 1 tablet (0.4 mg total) under the tongue every 5 (five) minutes as needed for chest pain.  . rosuvastatin (CRESTOR) 10 MG tablet Take 5 mg by mouth at bedtime.    FAMILY HISTORY:  His indicated that his mother is alive. He indicated that his father is deceased. He indicated that his sister is deceased. He indicated that his brother is alive.   SOCIAL HISTORY: He  reports that he quit smoking about 5 years ago. His smoking use included cigarettes. He has a 40.00 pack-year smoking history. He has never used smokeless tobacco. He reports that he drinks alcohol. He reports that he does not use drugs.  REVIEW OF SYSTEMS:   Not available  SUBJECTIVE:  Intubated stable mechanical ventilatory support  VITAL SIGNS: BP 111/78   Pulse 94   Temp (!) 102.3 F (39.1 C) (Axillary)   Resp (!) 26   Ht 5\' 10"  (1.778 m)   Wt (!) 340 lb 13.3 oz (154.6 kg)   SpO2 95%    BMI 48.90 kg/m   HEMODYNAMICS:    VENTILATOR SETTINGS: Vent Mode: PRVC FiO2 (%):  [40 %-60 %] 40 % Set Rate:  [26 bmp] 26 bmp Vt Set:  [580 mL] 580 mL PEEP:  [8 cmH20] 8 cmH20 Plateau Pressure:  [24 cmH20-29 cmH20] 28 cmH20  INTAKE / OUTPUT: I/O last 3 completed shifts: In: 6529.6 [I.V.:3810.7; NG/GT:2319.1; IV Piggyback:399.8] Out: 1301 [Urine:1301]  PHYSICAL EXAMINATION: General: Obese middle-aged male orally intubated and mechanically ventilated in no overt distress.   Neuro: After holding his Precedex and continuing his fentanyl he is not at all responsive to verbal stimuli.  In response to sternal rub he does partially eye open but does not move his limbs.  Pupils are equal at 3 mm.   HEENT: He has a very short thick neck, I cannot palpate his thyroid mass.   Cardiovascular: S1 and S2 are irregularly irregular without murmur rub or  gallop there is no dependent edema  Lungs: Abrasions are unlabored but he is breathing substantially above the set ventilator rate with a minute ventilation of approximately 16 L/min.  There is symmetric air movement a few scattered rhonchi and no wheezes  Abdomen: The abdomen is obese soft nontender without any overt organomegaly.   Musculoskeletal:  -edema and -tenderness Skin:  Intact  LABS:  BMET Recent Labs  Lab 11/21/17 0953 11/22/17 0116 11/23/17 0641  NA 138 140 142  K 3.6 3.4* 3.5  CL 110 111 115*  CO2 23 23 22   BUN 12 16 17   CREATININE 0.48* 0.61 0.62  GLUCOSE 166* 198* 180*    Electrolytes Recent Labs  Lab 11/20/17 1633 11/21/17 0500 11/21/17 0953 11/22/17 0116 11/23/17 0641  CALCIUM  --   --  8.0* 8.2* 7.4*  MG 2.2 2.2  --  2.1  --   PHOS 3.0 2.8  --  2.9  --     CBC Recent Labs  Lab 11/21/17 0953 11/22/17 0116 11/23/17 0641  WBC 9.6 10.7* 8.2  HGB 10.2* 10.8* 8.8*  HCT 33.5* 34.6* 29.5*  PLT 199 199 199    Coag's Recent Labs  Lab 11/18/17 0928 11/21/17 2311  APTT 31 34  INR 0.91 1.12     Sepsis Markers No results for input(s): LATICACIDVEN, PROCALCITON, O2SATVEN in the last 168 hours.  ABG Recent Labs  Lab 11/19/17 0435 11/21/17 1001 11/23/17 0346  PHART 7.401 7.431 7.377  PCO2ART 38.6 38.0 40.6  PO2ART 124* 130.0* 97.6    Liver Enzymes Recent Labs  Lab 11/18/17 0928  AST 24  ALT 34  ALKPHOS 58  BILITOT 1.0  ALBUMIN 3.5    Cardiac Enzymes No results for input(s): TROPONINI, PROBNP in the last 168 hours.  Glucose Recent Labs  Lab 11/23/17 0408 11/23/17 0522 11/23/17 0604 11/23/17 0738 11/23/17 0850 11/23/17 0952  GLUCAP 166* 166* 189* 171* 167* 195*    Imaging Dg Chest Port 1 View  Result Date: 11/22/2017 CLINICAL DATA:  62 year old male status post PICC line placement. EXAM: PORTABLE CHEST 1 VIEW COMPARISON:  0006 hours today and earlier. FINDINGS: Portable AP semi upright view at 1320 hours. Right side PICC line is in place. The tip is partially obscured by external EKG leads but likely at the cavoatrial junction level. Stable cardiac size and mediastinal contours. Stable endotracheal tube tip just below the clavicles. Enteric tube courses to the abdomen, tip not included. Mildly larger lung volumes and improved bibasilar ventilation. No new pulmonary process. IMPRESSION: Right PICC line in place, tip projects at the cavoatrial junction. Mildly improved ventilation. Electronically Signed   By: Genevie Ann M.D.   On: 11/22/2017 13:34   [place] Ekg Site Rite  Result Date: 11/22/2017 If Site Rite image not attached, placement could not be confirmed due to current cardiac rhythm.       CULTURES: 11/18/2017 sputum>>  ANTIBIOTICS: 11/18/2017 Unasyn>> SIGNIFICANT EVENTS: 11/18/2017 intubated  LINES/TUBES: 11/18/2017 endotracheal tube>>  DISCUSSION: [name] [name]  is a 62 year old male with a plethora of health issues who presented to [facility] on 11/18/2017 after contacting 911 when he noted slurred speech and right facial.  He was transported  to [facility] emergency department code stroke was initiated.  He had been on Eliquis in the past for paroxysmal atrial fibrillation have been stopped 3 weeks prior due to hemoptysis. CT scan revealed thyroid tumor increased in size from 1.42.5 and appeared to 10 months and also demonstrated multiple pulmonary  nodules.  There was concern that TPA could not be given due to recent hemoptysis and erosion of the thyroid nodule into the airway. He was urgently intubated by Dr. Nelda Marseille and erosion site was covered with the balloon cuff.  Patient was then given TPA and will be transferred to the neuro stroke unit. He will most likely remain intubated for period of 24 to 48 hours.  He will need further evaluation for most likely cancerous process in the neck and the lung.  ASSESSMENT / PLAN:  PULMONARY A: Vent dependent respiratory failure.  Fortunately he is no longer having any difficulties with hemoptysis.  He is febrile today and minute ventilation is very high.  There is a suspicion of aspiration and I have switched him from Unasyn to cefotetan and in the event that his fever is drug reaction. Chronic obstructive pulmonary disease Former smoker Multiple lung nodules suspicious for cancer  Apparently the source of bleeding was 3 cm below the cords on FOB.   CARDIOVASCULAR A:  History of  Paroxysmal atrial fibrillation, currently off anticoagulation for recent hemoptysis 3 weeks duration I have added a low-dose of Coreg for rate control.   Coronary artery disease.  He is currently on dual antiplatelet therapy and I have introduced a statin. Peripheral vascular disease   RENAL  A:   No acute renal issues P:   Monitor electrolytes   Thyroid Mass The patient likely has metastatic thyroid cancer. The thyroid may have eroded into the trachea causing his intermittent hemoptysis.  I have spoken with both ENT and thoracic surgery who feel that any sort of topical treatment of the erosion into the  trachea is only going to provide transient hemostasis and that he would really require definitive excision to control bleeding into the airway long-term.     GASTROINTESTINAL A:   History of GERD P:   Proton pump inhibitor Gastric tube  Will start OGT feeds  HEMATOLOGIC A:   Chronic anticoagulation for atrial fibrillation Off anticoagulation for 3 weeks due to her hemoptysis Fortunately we had no further hemoptysis overnight.  He continues on dual antiplatelet therapy for his newly placed vertebral and basilar artery stent  INFECTIOUS A:   Observed aspiration P:   He has been impressively febrile overnight with temperatures in excess of 102 degrees.  He has neither an elevated white count nor left shift and I am concerned that this is on the basis of either a CNS fever or drug fever.  Pertinent to the latter I have discontinued his Unasyn and substituted cephotetan Culture as needed  ENDOCRINE CBG (last 3)  Recent Labs    11/23/17 0738 11/23/17 0850 11/23/17 0952  GLUCAP 171* 167* 195*    A:   Diabetes mellitus P:   IV sliding scale insulin  PAD The patient has a cool right foot. vascualr has seen the patient No intervention isd planned for now. Will montor. Reluctant to anticoagualt patient given the recent bleeding issues. 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 This is day 5 status post infarct and the patient is febrile.  Beginning targeted temperature management and to preserve any penumbra at risk.   FAMILY    Greater than 32 minutes was spent in the care of this patient today, who is suffering multiple concurrent medical issues with  potentially life-threatening instability   Lars Masson, MD Critical Care Medicine

## 2017-11-24 ENCOUNTER — Inpatient Hospital Stay (HOSPITAL_COMMUNITY): Payer: 59

## 2017-11-24 ENCOUNTER — Other Ambulatory Visit: Payer: Self-pay

## 2017-11-24 DIAGNOSIS — I739 Peripheral vascular disease, unspecified: Secondary | ICD-10-CM

## 2017-11-24 LAB — PROCALCITONIN: Procalcitonin: 0.16 ng/mL

## 2017-11-24 LAB — CBC WITH DIFFERENTIAL/PLATELET
ABS IMMATURE GRANULOCYTES: 0.1 10*3/uL (ref 0.0–0.1)
BASOS ABS: 0.1 10*3/uL (ref 0.0–0.1)
BASOS PCT: 1 %
Eosinophils Absolute: 0.4 10*3/uL (ref 0.0–0.7)
Eosinophils Relative: 5 %
HCT: 28.9 % — ABNORMAL LOW (ref 39.0–52.0)
HEMOGLOBIN: 8.7 g/dL — AB (ref 13.0–17.0)
IMMATURE GRANULOCYTES: 1 %
LYMPHS PCT: 15 %
Lymphs Abs: 1.2 10*3/uL (ref 0.7–4.0)
MCH: 28.2 pg (ref 26.0–34.0)
MCHC: 30.1 g/dL (ref 30.0–36.0)
MCV: 93.8 fL (ref 78.0–100.0)
MONO ABS: 0.7 10*3/uL (ref 0.1–1.0)
Monocytes Relative: 8 %
NEUTROS ABS: 5.7 10*3/uL (ref 1.7–7.7)
NEUTROS PCT: 70 %
PLATELETS: 197 10*3/uL (ref 150–400)
RBC: 3.08 MIL/uL — ABNORMAL LOW (ref 4.22–5.81)
RDW: 13.4 % (ref 11.5–15.5)
WBC: 8.1 10*3/uL (ref 4.0–10.5)

## 2017-11-24 LAB — BASIC METABOLIC PANEL
Anion gap: 4 — ABNORMAL LOW (ref 5–15)
BUN: 23 mg/dL (ref 8–23)
CALCIUM: 8.6 mg/dL — AB (ref 8.9–10.3)
CHLORIDE: 114 mmol/L — AB (ref 98–111)
CO2: 24 mmol/L (ref 22–32)
Creatinine, Ser: 0.61 mg/dL (ref 0.61–1.24)
GFR calc Af Amer: >60 mL/min (ref >60)
Glucose, Bld: 165 mg/dL — ABNORMAL HIGH (ref 70–99)
POTASSIUM: 4.2 mmol/L (ref 3.5–5.1)
Sodium: 142 mmol/L (ref 135–145)

## 2017-11-24 LAB — GLUCOSE, CAPILLARY
GLUCOSE-CAPILLARY: 160 mg/dL — AB (ref 70–99)
GLUCOSE-CAPILLARY: 169 mg/dL — AB (ref 70–99)
GLUCOSE-CAPILLARY: 140 mg/dL — AB (ref 70–99)
GLUCOSE-CAPILLARY: 134 mg/dL — AB (ref 70–99)
GLUCOSE-CAPILLARY: 168 mg/dL — AB (ref 70–99)
GLUCOSE-CAPILLARY: 161 mg/dL — AB (ref 70–99)
GLUCOSE-CAPILLARY: 165 mg/dL — AB (ref 70–99)
GLUCOSE-CAPILLARY: 138 mg/dL — AB (ref 70–99)
GLUCOSE-CAPILLARY: 167 mg/dL — AB (ref 70–99)
Glucose-Capillary: 142 mg/dL — ABNORMAL HIGH (ref 70–99)
Glucose-Capillary: 143 mg/dL — ABNORMAL HIGH (ref 70–99)
Glucose-Capillary: 144 mg/dL — ABNORMAL HIGH (ref 70–99)
Glucose-Capillary: 147 mg/dL — ABNORMAL HIGH (ref 70–99)
Glucose-Capillary: 148 mg/dL — ABNORMAL HIGH (ref 70–99)
Glucose-Capillary: 156 mg/dL — ABNORMAL HIGH (ref 70–99)
Glucose-Capillary: 156 mg/dL — ABNORMAL HIGH (ref 70–99)
Glucose-Capillary: 157 mg/dL — ABNORMAL HIGH (ref 70–99)
Glucose-Capillary: 165 mg/dL — ABNORMAL HIGH (ref 70–99)
Glucose-Capillary: 165 mg/dL — ABNORMAL HIGH (ref 70–99)
Glucose-Capillary: 169 mg/dL — ABNORMAL HIGH (ref 70–99)
Glucose-Capillary: 172 mg/dL — ABNORMAL HIGH (ref 70–99)
Glucose-Capillary: 172 mg/dL — ABNORMAL HIGH (ref 70–99)

## 2017-11-24 LAB — BLOOD GAS, ARTERIAL
Acid-base deficit: 1.4 mmol/L (ref 0.0–2.0)
Bicarbonate: 22.7 mmol/L (ref 20.0–28.0)
Drawn by: 347621
FIO2: 40
LHR: 26 {breaths}/min
MECHVT: 580 mL
O2 Saturation: 99.1 %
PATIENT TEMPERATURE: 98.6
PCO2 ART: 37.1 mmHg (ref 32.0–48.0)
PEEP: 8 cmH2O
PO2 ART: 143 mmHg — AB (ref 83.0–108.0)
pH, Arterial: 7.404 (ref 7.350–7.450)

## 2017-11-24 LAB — PROTIME-INR
INR: 1.2
PROTHROMBIN TIME: 15.1 s (ref 11.4–15.2)

## 2017-11-24 MED ORDER — HYDROCHLOROTHIAZIDE 25 MG PO TABS
25.0000 mg | ORAL_TABLET | Freq: Every day | ORAL | Status: DC
  Administered 2017-11-24 – 2017-11-26 (×3): 25 mg via ORAL
  Filled 2017-11-24 (×3): qty 1

## 2017-11-24 MED ORDER — ROSUVASTATIN CALCIUM 20 MG PO TABS
20.0000 mg | ORAL_TABLET | Freq: Every day | ORAL | Status: DC
  Administered 2017-11-24 – 2017-12-14 (×21): 20 mg
  Filled 2017-11-24 (×21): qty 1

## 2017-11-24 MED ORDER — INSULIN GLARGINE 100 UNIT/ML ~~LOC~~ SOLN
12.0000 [IU] | Freq: Every day | SUBCUTANEOUS | Status: DC
  Administered 2017-11-24: 12 [IU] via SUBCUTANEOUS
  Filled 2017-11-24 (×2): qty 0.12

## 2017-11-24 NOTE — Progress Notes (Signed)
Physical Therapy Discharge Patient Details Name: [name] [name] MRN: [id number] DOB: [birth year] Today's Date: 11/24/2017 Time:  -     Patient discharged from PT services secondary to medical decline - will need to re-order PT to resume therapy services.  Please see latest therapy progress note for current level of functioning and progress toward goals.    Progress and discharge plan discussed with patient and/or caregiver: Patient unable to participate in discharge planning and no caregivers available  GP     Tessie Fass [name] 11/24/2017, 10:06 AM  11/24/2017  Donnella Sham, [place] [phone]  (pager)

## 2017-11-24 NOTE — Progress Notes (Signed)
Occupational Therapy Discharge Patient Details Name: [name] [name] MRN: [id number] DOB: 1955-10-13 Today's Date: 11/24/2017 Time:  -     Patient discharged from OT services secondary to medical decline - will need to re-order OT to resume therapy services.  Please see latest therapy progress note for current level of functioning and progress toward goals.    Progress and discharge plan discussed with patient and/or caregiver: Patient unable to participate in discharge planning and no caregivers available  GO     Vonita Moss   OTR/L Pager: [phone] Office: [phone] .  11/24/2017, 8:07 AM

## 2017-11-24 NOTE — Progress Notes (Signed)
PULMONARY / CRITICAL CARE MEDICINE   Name: [name] [name] MRN: [id number] DOB: March 26, 1956    ADMISSION DATE:  11/18/2017 CONSULTATION DATE: 11/18/2017  REFERRING MD: Emergency department  CHIEF COMPLAINT: Stroke  HISTORY OF PRESENT ILLNESS:    [name] [name]  is a 62 year old male with a plethora of health issues who presented to [facility] on 11/18/2017 after contacting 911 when he noted slurred speech and right facial.  He was transported to [facility] emergency department code stroke was initiated.  He had been on Eliquis in the past for paroxysmal atrial fibrillation have been stopped 3 weeks prior due to hemoptysis. 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  There was concern that TPA could not be given due to recent hemoptysis and erosion of the thyroid nodule into the airway. He was urgently intubated by Dr. Nelda Marseille and erosion site was covered with the balloon cuff.  Patient was then given TPA and will be transferred to the neuro stroke unit. He will most likely remain intubated for period of 24 to 48 hours.  He will need further evaluation for most likely cancerous process in the neck and the lung.  6/28 The patient is on a ventialtor in the ICU. He had an intervention on the basilar artery and left vertebrobasilar jn.A stent is in place He developed coolness and pallor in the right distal leg. The foot is cool.He has been seen by the vacuar service. The patient was dicovered to have a large thyroid mass with adenopathy that likely represents a thyroid cancer. The patient was apparently not aware of this finding preadmission.  6/29 The patient is off all sedation presently. He is responsive and able to follow commands. Neurology and I  had a long discussion with his family and girlfriend about his condition and plans. The patient has stable vitals. He did not have  A cuff leak this  AM.  6/30 The patient is awake and neurologically stable. He does not have much of an air leak when the cuff is deflated. If this persists the patient will likely need a tracheostomy. I believe we need a biopsy as we need to have some therapy to help shrink the thyroid and nodes if this is in fact a metastatic thyroid cancer.  7/1 there was some hemoptysis again last night.  Respiratory continues to suction some bloody secretions from the endotracheal tube today.  He is sedated on a combination of fentanyl and Versed but does follow simple instructions for me today.  7/2 no further hemoptysis overnight.  He is required heavy sedation to maintain ventilator synchrony.  He is not at all interactive when initially came into by me this morning.    7/3 This is post stroke day 6.  Active external cooling was initiated yesterday evening temperatures at times in excess of 102 degrees.  He is currently sedated with a combination of Precedex and fentanyl.  He remains intubated and mechanically ventilated and is on no pressors.     PAST MEDICAL HISTORY :  He  has a past medical history of Collagen vascular disease ([place]), Coronary artery disease, GERD (gastroesophageal reflux disease), High cholesterol, Hypertension, OSA (obstructive sleep apnea), Pneumonia (2011), and Type II diabetes mellitus ([place]) (07/2016).  PAST SURGICAL HISTORY: He  has a past surgical history that includes Video bronchoscopy (Bilateral, 08/25/2016); Lower Extremity Angiography (Bilateral, 04/01/2017); PERIPHERAL VASCULAR INTERVENTION (Left, 04/01/2017); Coronary angioplasty with stent (2011;  07/2016); Radiology with anesthesia (N/A, 11/18/2017); IR ANGIO EXTRACRAN SEL COM CAROTID INNOMINATE UNI BILAT MOD SED (11/18/2017); IR ANGIO VERTEBRAL SEL SUBCLAVIAN INNOMINATE UNI R MOD SED (11/18/2017); and IR Intra Cran Stent (11/18/2017).  No Known Allergies  No current facility-administered medications on file prior to encounter.    Current  Outpatient Medications on File Prior to Encounter  Medication Sig  . apixaban (ELIQUIS) 5 MG TABS tablet Take 1 tablet (5 mg total) 2 (two) times daily by mouth.  . dronedarone (MULTAQ) 400 MG tablet Take 1 tablet (400 mg total) 2 (two) times daily with a meal by mouth.  . irbesartan (AVAPRO) 75 MG tablet TAKE ONE TABLET BY MOUTH ONE TIME DAILY   . liraglutide (VICTOZA) 18 MG/3ML SOPN Inject 0.2 mLs (1.2 mg total) into the skin daily at 10 pm. Start with 0.6 mg for one week, then increase to 1.2 mg daily.  . metoprolol tartrate (LOPRESSOR) 50 MG tablet Take 1 tablet (50 mg total) by mouth 2 (two) times daily.  . Omega-3 Fatty Acids (FISH OIL) 1000 MG CAPS Take 2 capsules (2,000 mg total) by mouth 2 (two) times daily.  . pantoprazole (PROTONIX) 40 MG tablet TAKE 1 TABLET BY MOUTH ONCE A DAY 30 TO 60 MINUTES BEFORE FIRST MEAL OF THE DAY   . B Complex Vitamins (VITAMIN B COMPLEX PO) Take 1 capsule at bedtime by mouth.   . clopidogrel (PLAVIX) 75 MG tablet Take 1 tablet (75 mg total) by mouth daily. (Patient not taking: Reported on 11/20/2017)  . famotidine (PEPCID) 20 MG tablet One at bedtime  . fenofibrate 160 MG tablet Take 1 tablet (160 mg total) by mouth daily.  . metFORMIN (GLUCOPHAGE) 1000 MG tablet Take 1,000 mg by mouth 2 (two) times daily with a meal.  . nitroGLYCERIN (NITROSTAT) 0.4 MG SL tablet Place 1 tablet (0.4 mg total) under the tongue every 5 (five) minutes as needed for chest pain.  . rosuvastatin (CRESTOR) 10 MG tablet Take 5 mg by mouth at bedtime.    FAMILY HISTORY:  His indicated that his mother is alive. He indicated that his father is deceased. He indicated that his sister is deceased. He indicated that his brother is alive.   SOCIAL HISTORY: He  reports that he quit smoking about 5 years ago. His smoking use included cigarettes. He has a 40.00 pack-year smoking history. He has never used smokeless tobacco. He reports that he drinks alcohol. He reports that he does not use  drugs.  REVIEW OF SYSTEMS:   Not available  SUBJECTIVE:  Intubated stable mechanical ventilatory support  VITAL SIGNS: BP 115/73   Pulse 99   Temp 99.6 F (37.6 C) (Rectal)   Resp 18   Ht 5\' 10"  (1.778 m)   Wt (!) 353 lb 2.8 oz (160.2 kg)   SpO2 99%   BMI 50.68 kg/m   HEMODYNAMICS:    VENTILATOR SETTINGS: Vent Mode: PRVC FiO2 (%):  [40 %] 40 % Set Rate:  [26 bmp] 26 bmp Vt Set:  [580 mL] 580 mL PEEP:  [8 cmH20] 8 cmH20 Plateau Pressure:  [23 cmH20-26 cmH20] 25 cmH20  INTAKE / OUTPUT: I/O last 3 completed shifts: In: 6818.3 [I.V.:3945.8; NG/GT:2372.5; IV Piggyback:499.9] Out: 2726 [Urine:2726]  PHYSICAL EXAMINATION: General: This is an obese male who is orally intubated and mechanically ventilated.  He is in no overt distress.   Neuro: Exam is on Precedex and fentanyl.  He does partially eye open to voice but does not follow instructions.  He is not moving his limbs even in response to noxious stimuli.  Pupils are equal and EOMs are roving    HEENT: He has a very short thick neck, I cannot palpate his thyroid mass.   Cardiovascular: S1 and S2 are irregularly irregular without murmur rub or gallop.  He has trace dependent edema .  Feet are symmetrically warm with weak dorsalis pedis pulses Lungs: Respirations are unlabored, he is breathing above the set ventilator rate and continues to have a high minute ventilation.  There is symmetric air movement, some scattered rhonchi and no wheezes.   Abdomen: The abdomen is obese and soft.  It is nontender without masses or organomegaly.  He is anicteric.    LABS:  BMET Recent Labs  Lab 11/22/17 0116 11/23/17 0641 11/24/17 0241  NA 140 142 142  K 3.4* 3.5 4.2  CL 111 115* 114*  CO2 23 22 24   BUN 16 17 23   CREATININE 0.61 0.62 0.61  GLUCOSE 198* 180* 165*    Electrolytes Recent Labs  Lab 11/20/17 1633 11/21/17 0500  11/22/17 0116 11/23/17 0641 11/24/17 0241  CALCIUM  --   --    < > 8.2* 7.4* 8.6*  MG 2.2 2.2   --  2.1  --   --   PHOS 3.0 2.8  --  2.9  --   --    < > = values in this interval not displayed.    CBC Recent Labs  Lab 11/22/17 0116 11/23/17 0641 11/24/17 0517  WBC 10.7* 8.2 8.1  HGB 10.8* 8.8* 8.7*  HCT 34.6* 29.5* 28.9*  PLT 199 199 197    Coag's Recent Labs  Lab 11/18/17 0928 11/21/17 2311 11/24/17 0241  APTT 31 34  --   INR 0.91 1.12 1.20    Sepsis Markers Recent Labs  Lab 11/23/17 0954 11/24/17 0241  PROCALCITON 0.12 0.16    ABG Recent Labs  Lab 11/21/17 1001 11/23/17 0346 11/24/17 0335  PHART 7.431 7.377 7.404  PCO2ART 38.0 40.6 37.1  PO2ART 130.0* 97.6 143*    Liver Enzymes Recent Labs  Lab 11/18/17 0928  AST 24  ALT 34  ALKPHOS 58  BILITOT 1.0  ALBUMIN 3.5    Cardiac Enzymes No results for input(s): TROPONINI, PROBNP in the last 168 hours.  Glucose Recent Labs  Lab 11/24/17 0210 11/24/17 0302 11/24/17 0406 11/24/17 0504 11/24/17 0611 11/24/17 0707  GLUCAP 172* 156* 169* 142* 140* 134*    Imaging Dg Chest Port 1 View  Result Date: 11/23/2017 CLINICAL DATA:  62 year old male with fever.  Subsequent encounter. EXAM: PORTABLE CHEST 1 VIEW COMPARISON:  11/22/2017 chest x-ray. FINDINGS: Endotracheal tube tip 7 cm above the carina impressing upon right lateral wall. Nasogastric tube courses below the diaphragm. Tip is not included on the present exam. Right PICC line tip distal superior vena cava level. Cardiomegaly. Pulmonary vascular congestion. Small pleural effusions. Findings may represent component of mild pulmonary edema. Bibasilar consolidation progressive in the right lower lobe may represent infiltrate or atelectasis. IMPRESSION: Progression of bibasilar consolidation greater on the right which may represent infiltrate or atelectasis. Cardiomegaly and mild pulmonary edema similar to prior exam. Electronically Signed   By: Genia Del M.D.   On: 11/23/2017 10:33        CULTURES: 11/18/2017  sputum>>  ANTIBIOTICS: 11/18/2017 Unasyn>> SIGNIFICANT EVENTS: 11/18/2017 intubated  LINES/TUBES: 11/18/2017 endotracheal tube>>  DISCUSSION: Mr. Steedman  is a 62 year old male with a plethora of health issues who presented to [facility] on 11/18/2017 after contacting 911 when he noted slurred speech and right facial.  He was transported to [facility] emergency department code stroke was initiated.  He had been on Eliquis in the past for paroxysmal atrial fibrillation have been stopped 3 weeks prior due to hemoptysis. 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  There was concern that TPA could not be given due to recent hemoptysis and erosion of the thyroid nodule into the airway. He was urgently intubated by Dr. Nelda Marseille and erosion site was covered with the balloon cuff.  Patient was then given TPA and will be transferred to the neuro stroke unit. He will most likely remain intubated for period of 24 to 48 hours.  He will need further evaluation for most likely cancerous process in the neck and the lung.  ASSESSMENT / PLAN:  PULMONARY A: Vent dependent respiratory failure.  Fortunately he is no longer having any difficulties with hemoptysis.  He is febrile today and minute ventilation is very high.  There is a suspicion of aspiration and I have switched him from Unasyn to cefotetan and in the event that his fever is drug reaction. Chronic obstructive pulmonary disease Former smoker Multiple lung nodules suspicious for cancer  Apparently the source of bleeding was 3 cm below the cords on FOB.   CARDIOVASCULAR A:  History of  Paroxysmal atrial fibrillation, currently off anticoagulation for recent hemoptysis of 3 weeks duration He continues Coreg for rate control.     Coronary artery disease.  He is currently on dual antiplatelet therapy and I have increased his dose of statin to an  appropriate dose for secondary prevention following his CVA. Peripheral vascular disease   RENAL  A:   No acute renal issues P:   Monitor electrolytes   Thyroid Mass The patient likely has metastatic thyroid cancer. The thyroid may have eroded into the trachea causing his intermittent hemoptysis.  I have spoken with both ENT and thoracic surgery who feel that any sort of topical treatment of the erosion into the trachea is only going to provide transient hemostasis and that he would really require definitive excision to control bleeding into the airway long-term.  I feel far more compelled to ensure that he is stable from a central nervous system perspective and I do feel an urgency to diagnose the mass at this point.   GASTROINTESTINAL A:   History of GERD P:   Proton pump inhibitor Gastric tube  Will start OGT feeds  HEMATOLOGIC A:   Chronic anticoagulation for atrial fibrillation Off anticoagulation for 3 weeks due to her hemoptysis Fortunately we had no further hemoptysis overnight.  He continues on dual antiplatelet therapy for his newly placed vertebral and basilar artery stent  INFECTIOUS A:   Observed aspiration P:   We have no infectious source for his recent fevers.  He likely has bland aspiration with blood. His white count has not been elevated he is has not had a left shift and his procalcitonin is unimpressive.  We have no positive cultures.  We will continue to cool him for the next several days until we are beyond.  Were we are concerned about a penumbra at risk.    ENDOCRINE CBG (last 3)  Recent Labs    11/24/17 0504 11/24/17 0611 11/24/17 0707  GLUCAP 142* 140* 134*    A:   Diabetes mellitus P:   IV sliding scale  insulin  PAD Patient had a cool right foot which resolved with removing his right femoral sheath.    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 This is day 6 status post infarct and the patient is febrile.  Continue targeted temperature management and to preserve any penumbra at risk.   FAMILY    Greater than 32 minutes was spent in the care of this patient today, who is suffering multiple concurrent medical issues with potentially life-threatening instability   Lars Masson, MD [place]

## 2017-11-24 NOTE — Progress Notes (Signed)
STROKE TEAM PROGRESS NOTE   SUBJECTIVE (INTERVAL HISTORY) His girlfriend and nephew are at bedside. Pt still sedated and intubated, not following commands, not open eyes. Plan to have thyroid mass biopsy next week.     OBJECTIVE Temp:  [98.6 F (37 C)-102.5 F (39.2 C)] 100 F (37.8 C) (07/03 0800) Pulse Rate:  [28-133] 106 (07/03 1300) Cardiac Rhythm: Atrial fibrillation (07/03 0800) Resp:  [14-35] 17 (07/03 1300) BP: (103-159)/(67-87) 111/81 (07/03 1300) SpO2:  [93 %-100 %] 97 % (07/03 1300) FiO2 (%):  [40 %] 40 % (07/03 1156) Weight:  [353 lb 2.8 oz (160.2 kg)] 353 lb 2.8 oz (160.2 kg) (07/03 0500)  CBC:  Recent Labs  Lab 11/19/17 0437  11/23/17 0641 11/24/17 0517  WBC 13.1*   < > 8.2 8.1  NEUTROABS 10.9*  --   --  5.7  HGB 11.2*   < > 8.8* 8.7*  HCT 35.2*   < > 29.5* 28.9*  MCV 89.6   < > 93.9 93.8  PLT 273   < > 199 197   < > = values in this interval not displayed.    Basic Metabolic Panel:  Recent Labs  Lab 11/21/17 0500  11/22/17 0116 11/23/17 0641 11/24/17 0241  NA  --    < > 140 142 142  K  --    < > 3.4* 3.5 4.2  CL  --    < > 111 115* 114*  CO2  --    < > 23 22 24   GLUCOSE  --    < > 198* 180* 165*  BUN  --    < > 16 17 23   CREATININE  --    < > 0.61 0.62 0.61  CALCIUM  --    < > 8.2* 7.4* 8.6*  MG 2.2  --  2.1  --   --   PHOS 2.8  --  2.9  --   --    < > = values in this interval not displayed.    Lipid Panel:     Component Value Date/Time   CHOL 110 11/19/2017 0437   CHOL 119 08/09/2017 1000   TRIG 340 (H) 11/19/2017 0437   HDL 19 (L) 11/19/2017 0437   HDL 29 (L) 08/09/2017 1000   CHOLHDL 5.8 11/19/2017 0437   VLDL 68 (H) 11/19/2017 0437   LDLCALC 23 11/19/2017 0437   LDLCALC 30 08/09/2017 1000   HgbA1c:  Lab Results  Component Value Date   HGBA1C 9.1 (H) 11/19/2017   Urine Drug Screen: No results found for: LABOPIA, COCAINSCRNUR, LABBENZ, AMPHETMU, THCU, LABBARB  Alcohol Level No results found for: [place] I have  personally reviewed the radiological images below and agree with the radiology interpretations.  Ct Angio Head W Or Wo Contrast  Ct Angio Neck W Or Wo Contrast 11/18/2017 IMPRESSION:  Since the study of earlier today, there are only 2 changes. There is new consolidation and volume loss in the right upper lobe. 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 Flow is seen distal to that however, presumably indicating patent posterior communicating arteries.   Ct Angio Head W Or Wo Contrast Ct Angio Neck W Or Wo Contrast 11/18/2017 IMPRESSION:  1. Severe intracranial and extracranial atherosclerotic disease. No emergent large vessel occlusion  2. Severe stenosis right carotid bifurcation due to calcified plaque, estimated 90% diameter stenosis. No significant left carotid stenosis. Severe stenosis cavernous carotid bilaterally.  3. Both vertebral arteries are patent in the neck  with severe calcific stenosis V4 segment bilaterally.  4. 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 Small calcified lymph nodes left neck suspicious for metastatic disease  5. Small lung nodules on the right, possible metastatic disease. Recommend chest CT for further evaluation.   Ct Head Wo Contrast 11/19/2017 IMPRESSION:  1. Expected evolution of brainstem infarct without hemorrhage.  2. No new infarcts.  3. Left vertebral artery and basilar artery stent.  4. Acute on chronic sinus disease may be related to intubation.   Ct Chest W Contrast 11/18/2017 IMPRESSION Right upper lobe airspace opacity is noted with air bronchograms concerning for pneumonia or possibly atelectasis. Stable mediastinal adenopathy is noted compared to prior exam. Endotracheal and nasogastric tubes are in grossly good position. Coronary artery calcifications are noted suggesting coronary artery disease. Possible 2.8 cm left thyroid nodule. Thyroid ultrasound is recommended for further evaluation. Aortic Atherosclerosis  (ICD10-I70.0).   Mr Brain Wo Contrast 11/19/2017 IMPRESSION:  1. 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 No associated hemorrhage or significant mass effect.  2. Additional punctate acute/early subacute infarctions within left lateral cerebellum and right parietal cortex.  3. Minimal chronic microvascular ischemic changes of the brain and mild parenchymal volume loss.   Ct Head Code Stroke Wo Contrast 11/18/2017 IMPRESSION:  1. No acute intracranial abnormality.  Atherosclerotic calcification  2. ASPECTS is 10   Cerebral Angiogram / Stent  11/18/2017 1. 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 Occluded non dominant RT VBJ. 2. S/P endovascular revascularization of the distal basilar artery  To 90 % patency , and of 30 to 40 % of the LT VBJ with stent placement. Dominant  Lt Pcom.  Transthoracic Echocardiogram - Left ventricle: The cavity size was moderately dilated. Wall   thickness was increased in a pattern of severe LVH. Systolic   function was normal. The estimated ejection fraction was in the   range of 55% to 60%. Wall motion was normal; there were no   regional wall motion abnormalities. The study is not technically   sufficient to allow evaluation of LV diastolic function. - Aortic valve: Trileaflet. Sclerosis without stenosis. There was   no regurgitation. Valve area (Vmax): 2.88 cm^2. - Aorta: Aortic root ML diameter: 41.95 mm (ED). - Aortic root: The aortic root is mildly dilated. - Mitral valve: Calcified annulus. There was trivial regurgitation. - Left atrium: The atrium was mildly dilated. - Right ventricle: The cavity size was mildly dilated. Mildly   reduced systolic function. - Inferior vena cava: The vessel was normal in size. The   respirophasic diameter changes were in the normal range (>= 50%),   consistent  with normal central venous pressure. Impressions: - Compared to a prior study in 02/2017, there have been no   significant changes.  Dg Chest Port 1 View  Result Date: 11/23/2017 CLINICAL DATA:  62 year old male with fever.  Subsequent encounter. EXAM: PORTABLE CHEST 1 VIEW COMPARISON:  11/22/2017 chest x-ray. FINDINGS: Endotracheal tube tip 7 cm above the carina impressing upon right lateral wall. Nasogastric tube courses below the diaphragm. Tip is not included on the present exam. Right PICC line tip distal superior vena cava level. Cardiomegaly. Pulmonary vascular congestion. Small pleural effusions. Findings may represent component of mild pulmonary edema. Bibasilar consolidation progressive in the right lower lobe may represent infiltrate or atelectasis. IMPRESSION:  Progression of bibasilar consolidation greater on the right which may represent infiltrate or atelectasis. Cardiomegaly and mild pulmonary edema similar to prior exam. Electronically Signed   By: Genia Del M.D.   On: 11/23/2017 10:33    PHYSICAL EXAM  Temp:  [98.6 F (37 C)-102.5 F (39.2 C)] 100 F (37.8 C) (07/03 0800) Pulse Rate:  [28-133] 106 (07/03 1300) Resp:  [14-35] 17 (07/03 1300) BP: (103-159)/(67-87) 111/81 (07/03 1300) SpO2:  [93 %-100 %] 97 % (07/03 1300) FiO2 (%):  [40 %] 40 % (07/03 1156) Weight:  [353 lb 2.8 oz (160.2 kg)] 353 lb 2.8 oz (160.2 kg) (07/03 0500)  General - morbid obesity, well developed, intubated on sedation.  Ophthalmologic - fundi not visualized due to noncooperation.  Cardiovascular - irregularly irregular heart rate and rhythm.  Neuro - intubated on sedation, not following simple commands. Eyes closed, pinpoint pupils, not reactive to light, eye midposition, sluggish dolls eyes and no corneal but positive gag. Not blinking to visual threat bilaterally. Facial symmetry not able to test due to ET tube, not able to move tongue or mouth on command. No movement on all extremities, DTR  diminished and no babinski. Sensation, coordination and gait not tested.    ASSESSMENT/PLAN Mr. Jhalil Silvera is a 62 y.o. male with PMH of DM, hemoptysis, OSA, HTN, HLD, CAD, afib off Eliquis due to hemoptysis presenting with slurred speech, right sided weakness. Was intubated for hemoptysis prevention while giving tPA. Symptoms worsened and imaging confirmed BA occlusion. S/p IR with revascularization of BA as well as left VA s/p stenting.  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   MRI head - large acute/early subacute infarction within the central pons involving both sides, greater on the right.  CTA H&N - severe vascular athero including BA stenosis, b/l VA stenosis, right ICA proximal 90% stenosis, b/l cavernous ICA stenosis R>L.  Repeat CTA H/N - new distal BA occlusion  DSA - revascularization of the distal basilar artery to 90 % patency , and of 30 to 40 % of the LT VBJ with stent placement.  CT head repeat pending  2D Echo - EF 55-60%  LDL - 23  HgbA1c - 9.1  VTE prophylaxis - SCDs  Eliquis (recently stopped) and plavix prior to admission, now on aspirin 81 mg daily and Brilinta . Hold off AC for now due to hemoptysis.   Ongoing aggressive stroke risk factor management  Therapy recommendations:  pending  Disposition:  Pending  Thyroid mass  CTA neck - right large thyroid mass compressing on trachea.   Thyroid mass larger than CT chest in 08/2016  Evidence of tracheal erosion on bronch at this admission  Pending thyroid biopsy with IR next week  Based on  thyroid biopsy, will consider oncology consult  Respiratory failure  CCM on board  Current intubated on sedation  no cuff leak  Likely need trach  Do not think brilinta is a contraindication for trach  Discussed with Dr. Estanislado Pandy, if absolutely needed, can consider stop brilinta for trach with heparin bridge.  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 Long term BP goal normotensive.  Hyperlipidemia  Lipid lowering medication PTA: Crestor 10 mg daily  LDL 23, goal < 70  On Crestor  Continue statin at discharge  Diabetes  HgbA1c 9.1, goal < 7.0  Uncontrolled  On insulin drip, CBG stable  Also on lantus now, plan for IV to subq transition   CBG monitoring  CCM on board  Other Stroke Risk Factors  Advanced age  Former cigarette smoker - quit, 40 pack year history  Morbid obesity, Body mass index is 50.68 kg/m.    Hx stroke/TIA  Coronary artery disease   PAD with claudication  OSA on CPAP  Other Active Problems  Leukocytosis WBC  13.1->9.6->10.7->8.2->8.[facility] day # 6  This patient is critically ill due to stroke, PAF, thyroid mass, hemoptysis, fever, severe vascular athero and at significant risk of neurological worsening, death form recurrent stroke, hemorrhagic conversion, heart failure, bleeding, shock. This patient's care requires constant monitoring of vital signs, hemodynamics, respiratory and cardiac monitoring, review of multiple databases, neurological assessment, discussion with family, other specialists and medical decision making of high complexity. I spent 35 minutes of neurocritical care time in the care of this patient. I had long discussion with girlfriend at bedside, updated pt current condition, treatment plan and potential prognosis. She expressed understanding and appreciation. I also discussed with Dr. Dominica Severin.  Rosalin Hawking, MD PhD Stroke Neurology 11/24/2017 1:46 PM   To contact Stroke Continuity provider, please refer to [email or web address] After hours, contact General Neurology

## 2017-11-24 NOTE — Progress Notes (Signed)
Patient transported to and from CT on ventilator with no complications.  RT will continue to monitor.

## 2017-11-24 NOTE — Progress Notes (Signed)
SLP Cancellation Note  Patient Details Name: [name] [name] MRN: [id number] DOB: [birth year]   Cancelled treatment:       Reason Eval/Treat Not Completed: Patient not medically ready. Pt not ready for SLP interventions. Will sign off at this time, please reorder when appropriate.    Cherron Blitzer, Katherene Ponto 11/24/2017, 7:54 AM

## 2017-11-25 ENCOUNTER — Inpatient Hospital Stay (HOSPITAL_COMMUNITY): Payer: 59

## 2017-11-25 LAB — GLUCOSE, CAPILLARY
GLUCOSE-CAPILLARY: 171 mg/dL — AB (ref 70–99)
GLUCOSE-CAPILLARY: 167 mg/dL — AB (ref 70–99)
GLUCOSE-CAPILLARY: 190 mg/dL — AB (ref 70–99)
GLUCOSE-CAPILLARY: 188 mg/dL — AB (ref 70–99)
GLUCOSE-CAPILLARY: 160 mg/dL — AB (ref 70–99)
GLUCOSE-CAPILLARY: 157 mg/dL — AB (ref 70–99)
GLUCOSE-CAPILLARY: 196 mg/dL — AB (ref 70–99)
GLUCOSE-CAPILLARY: 179 mg/dL — AB (ref 70–99)
GLUCOSE-CAPILLARY: 187 mg/dL — AB (ref 70–99)
GLUCOSE-CAPILLARY: 153 mg/dL — AB (ref 70–99)
GLUCOSE-CAPILLARY: 154 mg/dL — AB (ref 70–99)
Glucose-Capillary: 136 mg/dL — ABNORMAL HIGH (ref 70–99)
Glucose-Capillary: 139 mg/dL — ABNORMAL HIGH (ref 70–99)
Glucose-Capillary: 149 mg/dL — ABNORMAL HIGH (ref 70–99)
Glucose-Capillary: 152 mg/dL — ABNORMAL HIGH (ref 70–99)
Glucose-Capillary: 152 mg/dL — ABNORMAL HIGH (ref 70–99)
Glucose-Capillary: 165 mg/dL — ABNORMAL HIGH (ref 70–99)
Glucose-Capillary: 168 mg/dL — ABNORMAL HIGH (ref 70–99)
Glucose-Capillary: 174 mg/dL — ABNORMAL HIGH (ref 70–99)
Glucose-Capillary: 177 mg/dL — ABNORMAL HIGH (ref 70–99)
Glucose-Capillary: 181 mg/dL — ABNORMAL HIGH (ref 70–99)
Glucose-Capillary: 185 mg/dL — ABNORMAL HIGH (ref 70–99)
Glucose-Capillary: 189 mg/dL — ABNORMAL HIGH (ref 70–99)

## 2017-11-25 LAB — CBC WITH DIFFERENTIAL/PLATELET
ABS IMMATURE GRANULOCYTES: 0.1 10*3/uL (ref 0.0–0.1)
BASOS ABS: 0.1 10*3/uL (ref 0.0–0.1)
BASOS PCT: 1 %
Eosinophils Absolute: 0.7 10*3/uL (ref 0.0–0.7)
Eosinophils Relative: 7 %
HCT: 32.6 % — ABNORMAL LOW (ref 39.0–52.0)
Hemoglobin: 9.9 g/dL — ABNORMAL LOW (ref 13.0–17.0)
IMMATURE GRANULOCYTES: 1 %
Lymphocytes Relative: 11 %
Lymphs Abs: 1.1 10*3/uL (ref 0.7–4.0)
MCH: 28.3 pg (ref 26.0–34.0)
MCHC: 30.4 g/dL (ref 30.0–36.0)
MCV: 93.1 fL (ref 78.0–100.0)
Monocytes Absolute: 0.6 10*3/uL (ref 0.1–1.0)
Monocytes Relative: 6 %
NEUTROS ABS: 7.6 10*3/uL (ref 1.7–7.7)
NEUTROS PCT: 74 %
PLATELETS: 226 10*3/uL (ref 150–400)
RBC: 3.5 MIL/uL — AB (ref 4.22–5.81)
RDW: 13.4 % (ref 11.5–15.5)
WBC: 10 10*3/uL (ref 4.0–10.5)

## 2017-11-25 LAB — COMPREHENSIVE METABOLIC PANEL
ALT: 15 U/L (ref 0–44)
AST: 14 U/L — ABNORMAL LOW (ref 15–41)
Albumin: 2 g/dL — ABNORMAL LOW (ref 3.5–5.0)
Alkaline Phosphatase: 43 U/L (ref 38–126)
Anion gap: 8 (ref 5–15)
BUN: 19 mg/dL (ref 8–23)
CHLORIDE: 108 mmol/L (ref 98–111)
CO2: 25 mmol/L (ref 22–32)
Calcium: 9.3 mg/dL (ref 8.9–10.3)
Creatinine, Ser: 0.52 mg/dL — ABNORMAL LOW (ref 0.61–1.24)
Glucose, Bld: 217 mg/dL — ABNORMAL HIGH (ref 70–99)
POTASSIUM: 3.8 mmol/L (ref 3.5–5.1)
SODIUM: 141 mmol/L (ref 135–145)
Total Bilirubin: 0.6 mg/dL (ref 0.3–1.2)
Total Protein: 5.5 g/dL — ABNORMAL LOW (ref 6.5–8.1)

## 2017-11-25 LAB — BLOOD GAS, ARTERIAL
ACID-BASE EXCESS: 0.1 mmol/L (ref 0.0–2.0)
BICARBONATE: 23.7 mmol/L (ref 20.0–28.0)
Drawn by: 347621
FIO2: 40
LHR: 26 {breaths}/min
O2 Saturation: 97.3 %
PATIENT TEMPERATURE: 98.1
PCO2 ART: 35.2 mmHg (ref 32.0–48.0)
PEEP: 8 cmH2O
PO2 ART: 85.1 mmHg (ref 83.0–108.0)
VT: 580 mL
pH, Arterial: 7.443 (ref 7.350–7.450)

## 2017-11-25 LAB — PROCALCITONIN: PROCALCITONIN: 0.13 ng/mL

## 2017-11-25 MED ORDER — INSULIN ASPART 100 UNIT/ML ~~LOC~~ SOLN
0.0000 [IU] | SUBCUTANEOUS | Status: DC
  Administered 2017-11-25: 3 [IU] via SUBCUTANEOUS
  Administered 2017-11-26 (×2): 5 [IU] via SUBCUTANEOUS

## 2017-11-25 MED ORDER — INSULIN GLARGINE 100 UNIT/ML ~~LOC~~ SOLN
15.0000 [IU] | Freq: Every day | SUBCUTANEOUS | Status: DC
  Administered 2017-11-25: 15 [IU] via SUBCUTANEOUS
  Filled 2017-11-25: qty 0.15

## 2017-11-25 NOTE — Progress Notes (Signed)
STROKE TEAM PROGRESS NOTE   SUBJECTIVE (INTERVAL HISTORY) His daughter and the brother and sister-in-law are at bedside. Pt had agitation and fighting with ventilation after fentanyl taper.  Fentanyl bolus given and resumed fentanyl sedation.  Tachycardia and tachypnea gradually resolved.  Not following commands.  Hemoptysis seems stopped.  Pending thyroid mass biopsy.  CT repeat yesterday no acute changes.    OBJECTIVE Temp:  [99.4 F (37.4 C)-100 F (37.8 C)] 99.4 F (37.4 C) (07/04 0800) Pulse Rate:  [88-170] 108 (07/04 1000) Cardiac Rhythm: Atrial fibrillation (07/04 0800) Resp:  [17-30] 21 (07/04 1000) BP: (104-177)/(61-115) 108/66 (07/04 1000) SpO2:  [92 %-100 %] 97 % (07/04 1000) FiO2 (%):  [40 %] 40 % (07/04 0800) Weight:  [354 lb 8 oz (160.8 kg)] 354 lb 8 oz (160.8 kg) (07/04 0500)  CBC:  Recent Labs  Lab 11/24/17 0517 11/25/17 0403  WBC 8.1 10.0  NEUTROABS 5.7 7.6  HGB 8.7* 9.9*  HCT 28.9* 32.6*  MCV 93.8 93.1  PLT 197 017    Basic Metabolic Panel:  Recent Labs  Lab 11/21/17 0500  11/22/17 0116  11/24/17 0241 11/25/17 0403  NA  --    < > 140   < > 142 141  K  --    < > 3.4*   < > 4.2 3.8  CL  --    < > 111   < > 114* 108  CO2  --    < > 23   < > 24 25  GLUCOSE  --    < > 198*   < > 165* 217*  BUN  --    < > 16   < > 23 19  CREATININE  --    < > 0.61   < > 0.61 0.52*  CALCIUM  --    < > 8.2*   < > 8.6* 9.3  MG 2.2  --  2.1  --   --   --   PHOS 2.8  --  2.9  --   --   --    < > = values in this interval not displayed.    Lipid Panel:     Component Value Date/Time   CHOL 110 11/19/2017 0437   CHOL 119 08/09/2017 1000   TRIG 340 (H) 11/19/2017 0437   HDL 19 (L) 11/19/2017 0437   HDL 29 (L) 08/09/2017 1000   CHOLHDL 5.8 11/19/2017 0437   VLDL 68 (H) 11/19/2017 0437   LDLCALC 23 11/19/2017 0437   LDLCALC 30 08/09/2017 1000   HgbA1c:  Lab Results  Component Value Date   HGBA1C 9.1 (H) 11/19/2017   Urine Drug Screen: No results found for:  LABOPIA, COCAINSCRNUR, LABBENZ, AMPHETMU, THCU, LABBARB  Alcohol Level No results found for: [place] I have personally reviewed the radiological images below and agree with the radiology interpretations.  Ct Angio Head W Or Wo Contrast  Ct Angio Neck W Or Wo Contrast 11/18/2017 IMPRESSION:  Since the study of earlier today, there are only 2 changes. There is new consolidation and volume loss in the right upper lobe. 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 Flow is seen distal to that however, presumably indicating patent posterior communicating arteries.   Ct Angio Head W Or Wo Contrast Ct Angio Neck W Or Wo Contrast 11/18/2017 IMPRESSION:  1. Severe intracranial and extracranial atherosclerotic disease. No emergent large vessel occlusion  2. Severe stenosis right carotid bifurcation due to calcified plaque, estimated 90% diameter  stenosis. No significant left carotid stenosis. Severe stenosis cavernous carotid bilaterally.  3. Both vertebral arteries are patent in the neck with severe calcific stenosis V4 segment bilaterally.  4. 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 Small calcified lymph nodes left neck suspicious for metastatic disease  5. Small lung nodules on the right, possible metastatic disease. Recommend chest CT for further evaluation.   Ct Head Wo Contrast 11/19/2017 IMPRESSION:  1. Expected evolution of brainstem infarct without hemorrhage.  2. No new infarcts.  3. Left vertebral artery and basilar artery stent.  4. Acute on chronic sinus disease may be related to intubation.   Ct Chest W Contrast 11/18/2017 IMPRESSION Right upper lobe airspace opacity is noted with air bronchograms concerning for pneumonia or possibly atelectasis. Stable mediastinal adenopathy is noted compared to prior exam. Endotracheal and nasogastric tubes are in grossly good position. Coronary artery calcifications are noted suggesting coronary artery disease.  Possible 2.8 cm left thyroid nodule. Thyroid ultrasound is recommended for further evaluation. Aortic Atherosclerosis (ICD10-I70.0).   Mr Brain Wo Contrast 11/19/2017 IMPRESSION:  1. 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 No associated hemorrhage or significant mass effect.  2. Additional punctate acute/early subacute infarctions within left lateral cerebellum and right parietal cortex.  3. Minimal chronic microvascular ischemic changes of the brain and mild parenchymal volume loss.   Ct Head Code Stroke Wo Contrast 11/18/2017 IMPRESSION:  1. No acute intracranial abnormality.  Atherosclerotic calcification  2. ASPECTS is 10   Cerebral Angiogram / Stent  11/18/2017 1. 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 Occluded non dominant RT VBJ. 2. S/P endovascular revascularization of the distal basilar artery  To 90 % patency , and of 30 to 40 % of the LT VBJ with stent placement. Dominant  Lt Pcom.  Transthoracic Echocardiogram - Left ventricle: The cavity size was moderately dilated. Wall   thickness was increased in a pattern of severe LVH. Systolic   function was normal. The estimated ejection fraction was in the   range of 55% to 60%. Wall motion was normal; there were no   regional wall motion abnormalities. The study is not technically   sufficient to allow evaluation of LV diastolic function. - Aortic valve: Trileaflet. Sclerosis without stenosis. There was   no regurgitation. Valve area (Vmax): 2.88 cm^2. - Aorta: Aortic root ML diameter: 41.95 mm (ED). - Aortic root: The aortic root is mildly dilated. - Mitral valve: Calcified annulus. There was trivial regurgitation. - Left atrium: The atrium was mildly dilated. - Right ventricle: The cavity size was mildly dilated. Mildly   reduced systolic function. - Inferior vena cava:  The vessel was normal in size. The   respirophasic diameter changes were in the normal range (>= 50%),   consistent with normal central venous pressure. Impressions: - Compared to a prior study in 02/2017, there have been no   significant changes.  Ct Head Wo Contrast 11/24/2017 CLINICAL DATA:  Follow-up examination for stroke. EXAM: CT HEAD WITHOUT CONTRAST TECHNIQUE: Contiguous axial images were obtained from the base of the skull through the vertex without intravenous contrast. COMPARISON:  Prior CT from 11/19/2017 as well as earlier studies. FINDINGS: Brain: Normal expected interval evolution of previously identified pontine infarct. No evidence for hemorrhagic transformation or other complication. Adjacent basilar cisterns and fourth ventricle remain patent. No  acute intracranial hemorrhage. No new large vessel territory infarct. No extra-axial fluid collection. Ventricles stable in size without hydrocephalus. Vascular: Vertebrobasilar stent in place, stable. Calcified atherosclerotic change at the skull base again noted. No new hyperdense vessel. Skull: Scalp soft tissues and calvarium within normal limits. Sinuses/Orbits: Globes and orbital soft tissues normal. Moderate mucosal thickening throughout the paranasal sinuses. Fluid within the partially visualized nasopharynx. Bilateral mastoid effusions. Patient likely intubated. Other: None. IMPRESSION: 1. Continued normal expected interval evolution of brainstem infarct. No evidence for hemorrhagic transformation or other complication. 2. No other new intracranial abnormality. 3. Moderate paranasal sinus disease with bilateral mastoid effusions, likely related intubation. Electronically Signed   By: Jeannine Boga M.D.   On: 11/24/2017 15:21   Dg Chest Port 1 View 11/25/2017 CLINICAL DATA:  Intubation EXAM: PORTABLE CHEST 1 VIEW COMPARISON:  11/23/2017 FINDINGS: Endotracheal tube and nasogastric catheter are noted in satisfactory position.  Right-sided PICC line is again seen in satisfactory position. Patient is significantly rotated to the right accentuating the mediastinal markings. Mild vascular congestion is noted without focal confluent infiltrate. IMPRESSION: Mild vascular congestion. Tubes and lines as described. Electronically Signed   By: Inez Catalina M.D.   On: 11/25/2017 07:56     PHYSICAL EXAM  Temp:  [99.4 F (37.4 C)-100 F (37.8 C)] 99.4 F (37.4 C) (07/04 0800) Pulse Rate:  [88-170] 108 (07/04 1000) Resp:  [17-30] 21 (07/04 1000) BP: (104-177)/(61-115) 108/66 (07/04 1000) SpO2:  [92 %-100 %] 97 % (07/04 1000) FiO2 (%):  [40 %] 40 % (07/04 0800) Weight:  [354 lb 8 oz (160.8 kg)] 354 lb 8 oz (160.8 kg) (07/04 0500)  General - morbid obesity, well developed, intubated on sedation.  Ophthalmologic - fundi not visualized due to noncooperation.  Cardiovascular - irregularly irregular heart rate and rhythm, afib RVR.  Neuro - intubated on sedation, not following simple commands. Eyes closed, pinpoint pupils, not reactive to light, eye midposition, sluggish dolls eyes and no corneal but positive gag. Not blinking to visual threat bilaterally. Facial symmetry not able to test due to ET tube, not able to move tongue or mouth on command. No movement on all extremities, DTR diminished and no babinski. Sensation, coordination and gait not tested.    ASSESSMENT/PLAN Mr. Future Yeldell is a 62 y.o. male with PMH of DM, hemoptysis, OSA, HTN, HLD, CAD, afib off Eliquis due to hemoptysis presenting with slurred speech, right sided weakness. Was intubated for hemoptysis prevention while giving tPA. Symptoms worsened and imaging confirmed BA occlusion. S/p IR with revascularization of BA as well as left VA s/p stenting.  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   MRI head - large acute/early subacute infarction within the central pons involving both sides, greater on the right.  CTA H&N - severe vascular athero including BA stenosis, b/l VA stenosis, right ICA proximal 90% stenosis, b/l cavernous ICA stenosis R>L.  Repeat CTA H/N - new distal BA occlusion  DSA - revascularization of the distal basilar artery to 90 % patency , and of 30 to 40 % of the LT VBJ with stent placement.  CT head 11/24/17 showed  normal evolution of brainstem infarct, no acute change  2D Echo - EF 55-60%  LDL - 23  HgbA1c - 9.1  VTE prophylaxis - SCDs  Eliquis (recently stopped) and plavix prior to admission, now on aspirin 81 mg daily and Brilinta . Hold off AC for now due to hemoptysis and pending biopsy.   Ongoing aggressive stroke risk factor management  Therapy recommendations:  pending  Disposition:  Pending  Thyroid mass  CTA neck - right large thyroid mass compressing on trachea.   Thyroid mass larger than CT chest in 08/2016  Evidence of tracheal erosion on bronch at this admission  Pending thyroid biopsy with IR next week  Based on thyroid biopsy, will consider oncology consult  Respiratory failure  CCM on board  Current intubated on sedation  no cuff leak  Fighting with vent if lighting up sedation  Likely need trach  Do not think brilinta is a contraindication for trach  Discussed with Dr. Estanislado Pandy, if absolutely needed, can consider stop brilinta for trach with heparin bridge.  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 Long term BP goal normotensive.  Hyperlipidemia  Lipid lowering medication PTA: Crestor 10 mg daily  LDL 23, goal < 70  On Crestor  Continue statin at discharge  Diabetes  HgbA1c 9.1, goal < 7.0  Uncontrolled  On insulin drip, CBG stable  Also on lantus now, plan for IV to subq transition   CBG monitoring  CCM on board  Other Stroke Risk Factors  Advanced age  Former cigarette smoker - quit, 40 pack year history  Morbid obesity, Body mass index is 50.87 kg/m.    Hx stroke/TIA  Coronary artery disease   PAD with claudication  OSA on CPAP  Other Active Problems  Leukocytosis WBC 13.1->9.6->10.7->8.2->8.1->10.0   Hospital day # 7  This patient is critically ill due to stroke, PAF, thyroid mass, hemoptysis, fever, severe vascular athero and at significant risk of neurological worsening, death form recurrent stroke, hemorrhagic conversion, heart failure, bleeding, shock. This patient's care requires constant monitoring of vital signs, hemodynamics, respiratory and cardiac monitoring, review of multiple databases,  neurological assessment, discussion with family, other specialists and medical decision making of high complexity. I spent 35 minutes of neurocritical care time in the care of this patient. I had long discussion with girlfriend at bedside, updated pt current condition, treatment plan and potential prognosis.  She expressed understanding and appreciation. I also discussed with Dr. Dominica Severin.  Rosalin Hawking, MD PhD Stroke Neurology 11/25/2017 10:27 AM   To contact Stroke Continuity provider, please refer to [email or web address] After hours, contact General Neurology

## 2017-11-25 NOTE — Progress Notes (Signed)
Pt on IV insulin for >1 week. CCM notified and will try SSI with additional lantus.

## 2017-11-25 NOTE — Progress Notes (Signed)
PULMONARY / CRITICAL CARE MEDICINE   Name: [name] [name] MRN: [id number] DOB: 1955-10-14    ADMISSION DATE:  11/18/2017 CONSULTATION DATE: 11/18/2017  REFERRING MD: Emergency department  CHIEF COMPLAINT: Stroke  HISTORY OF PRESENT ILLNESS:    [name]  is a 62 year old male with a plethora of health issues who presented to [facility] on 11/18/2017 after contacting 911 when he noted slurred speech and right facial.  He was transported to [facility] emergency department code stroke was initiated.  He had been on Eliquis in the past for paroxysmal atrial fibrillation have been stopped 3 weeks prior due to hemoptysis. 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  There was concern that TPA could not be given due to recent hemoptysis and erosion of the thyroid nodule into the airway. He was urgently intubated by Dr. Nelda Marseille and erosion site was covered with the balloon cuff.  Patient was then given TPA and will be transferred to the neuro stroke unit. He will most likely remain intubated for period of 24 to 48 hours.  He will need further evaluation for most likely cancerous process in the neck and the lung.  7/4 This is post CVA day 7. No events overnight. He is still being actively cooled. No blood in ETT    PAST MEDICAL HISTORY :  He  has a past medical history of Collagen vascular disease ([place]), Coronary artery disease, GERD (gastroesophageal reflux disease), High cholesterol, Hypertension, OSA (obstructive sleep apnea), Pneumonia (2011), and Type II diabetes mellitus ([place]) (07/2016).  PAST SURGICAL HISTORY: He  has a past surgical history that includes Video bronchoscopy (Bilateral, 08/25/2016); Lower Extremity Angiography (Bilateral, 04/01/2017); PERIPHERAL VASCULAR INTERVENTION (Left, 04/01/2017); Coronary angioplasty with stent (2011; 07/2016); Radiology with anesthesia (N/A, 11/18/2017); IR  ANGIO EXTRACRAN SEL COM CAROTID INNOMINATE UNI BILAT MOD SED (11/18/2017); IR ANGIO VERTEBRAL SEL SUBCLAVIAN INNOMINATE UNI R MOD SED (11/18/2017); and IR Intra Cran Stent (11/18/2017).  No Known Allergies  No current facility-administered medications on file prior to encounter.    Current Outpatient Medications on File Prior to Encounter  Medication Sig  . apixaban (ELIQUIS) 5 MG TABS tablet Take 1 tablet (5 mg total) 2 (two) times daily by mouth.  . dronedarone (MULTAQ) 400 MG tablet Take 1 tablet (400 mg total) 2 (two) times daily with a meal by mouth.  . irbesartan (AVAPRO) 75 MG tablet TAKE ONE TABLET BY MOUTH ONE TIME DAILY   . liraglutide (VICTOZA) 18 MG/3ML SOPN Inject 0.2 mLs (1.2 mg total) into the skin daily at 10 pm. Start with 0.6 mg for one week, then increase to 1.2 mg daily.  . metoprolol tartrate (LOPRESSOR) 50 MG tablet Take 1 tablet (50 mg total) by mouth 2 (two) times daily.  . Omega-3 Fatty Acids (FISH OIL) 1000 MG CAPS Take 2 capsules (2,000 mg total) by mouth 2 (two) times daily.  . pantoprazole (PROTONIX) 40 MG tablet TAKE 1 TABLET BY MOUTH ONCE A DAY 30 TO 60 MINUTES BEFORE FIRST MEAL OF THE DAY   . B Complex Vitamins (VITAMIN B COMPLEX PO) Take 1 capsule at bedtime by mouth.   . clopidogrel (PLAVIX) 75 MG tablet Take 1 tablet (75 mg total) by mouth daily. (Patient not taking: Reported on 11/20/2017)  . famotidine (PEPCID) 20 MG tablet One at bedtime  . fenofibrate 160 MG tablet Take 1 tablet (160 mg total) by mouth daily.  . metFORMIN (GLUCOPHAGE)  1000 MG tablet Take 1,000 mg by mouth 2 (two) times daily with a meal.  . nitroGLYCERIN (NITROSTAT) 0.4 MG SL tablet Place 1 tablet (0.4 mg total) under the tongue every 5 (five) minutes as needed for chest pain.  . rosuvastatin (CRESTOR) 10 MG tablet Take 5 mg by mouth at bedtime.    FAMILY HISTORY:  His indicated that his mother is alive. He indicated that his father is deceased. He indicated that his sister is deceased. He  indicated that his brother is alive.   SOCIAL HISTORY: He  reports that he quit smoking about 5 years ago. His smoking use included cigarettes. He has a 40.00 pack-year smoking history. He has never used smokeless tobacco. He reports that he drinks alcohol. He reports that he does not use drugs.  REVIEW OF SYSTEMS:   Not available  SUBJECTIVE:  Intubated stable mechanical ventilatory support  VITAL SIGNS: BP 120/75   Pulse (!) 101   Temp 100 F (37.8 C) (Core)   Resp (!) 30   Ht 5\' 10"  (1.778 m)   Wt (!) 354 lb 8 oz (160.8 kg)   SpO2 99%   BMI 50.87 kg/m   HEMODYNAMICS:    VENTILATOR SETTINGS: Vent Mode: PRVC FiO2 (%):  [40 %] 40 % Set Rate:  [26 bmp] 26 bmp Vt Set:  [580 mL] 580 mL PEEP:  [8 cmH20] 8 cmH20 Plateau Pressure:  [16 cmH20-25 cmH20] 19 cmH20  INTAKE / OUTPUT: I/O last 3 completed shifts: In: 4376.2 [I.V.:2466.2; NG/GT:1560; IV Piggyback:350] Out: 3800 [Urine:3800]  PHYSICAL EXAMINATION: General: This is an obese male who is orally intubated and mechanically ventilated.  He is maintaining ventilator synchrony on his current sedation.  He is not interactive.     Neuro: Exam is on Precedex and fentanyl.  He does partially eye open to voice but does not interact.  He is not moving his limbs to noxious stimuli.  Pupils are equal and reactive and EOMs appear to be full.    HEENT: He has a very short thick neck, I cannot palpate his thyroid mass.   Cardiovascular: S1 and S2 are somewhat distant and irregularly irregular without murmur rub or gallop.  He is trace dependent edema.  Feet are symmetrically warm.   Lungs: Respirations are unlabored, there is symmetric air movement, there are no wheezes, he is breathing above the set ventilator rate.    Abdomen: Abdomen is obese soft and nontender.  There are no masses organomegaly.    Recent Labs  Lab 11/23/17 0641 11/24/17 0241 11/25/17 0403  NA 142 142 141  K 3.5 4.2 3.8  CL 115* 114* 108  CO2 22 24 25   BUN  17 23 19   CREATININE 0.62 0.61 0.52*  GLUCOSE 180* 165* 217*    Electrolytes Recent Labs  Lab 11/20/17 1633 11/21/17 0500  11/22/17 0116 11/23/17 0641 11/24/17 0241 11/25/17 0403  CALCIUM  --   --    < > 8.2* 7.4* 8.6* 9.3  MG 2.2 2.2  --  2.1  --   --   --   PHOS 3.0 2.8  --  2.9  --   --   --    < > = values in this interval not displayed.    CBC Recent Labs  Lab 11/23/17 0641 11/24/17 0517 11/25/17 0403  WBC 8.2 8.1 10.0  HGB 8.8* 8.7* 9.9*  HCT 29.5* 28.9* 32.6*  PLT 199 197 226    Coag's Recent Labs  Lab 11/18/17  3382 11/21/17 2311 11/24/17 0241  APTT 31 34  --   INR 0.91 1.12 1.20    Sepsis Markers Recent Labs  Lab 11/23/17 0954 11/24/17 0241 11/25/17 0403  PROCALCITON 0.12 0.16 0.13    ABG Recent Labs  Lab 11/23/17 0346 11/24/17 0335 11/25/17 0350  PHART 7.377 7.404 7.443  PCO2ART 40.6 37.1 35.2  PO2ART 97.6 143* 85.1    Liver Enzymes Recent Labs  Lab 11/18/17 0928 11/25/17 0403  AST 24 14*  ALT 34 15  ALKPHOS 58 43  BILITOT 1.0 0.6  ALBUMIN 3.5 2.0*    Cardiac Enzymes No results for input(s): TROPONINI, PROBNP in the last 168 hours.  Glucose Recent Labs  Lab 11/25/17 0220 11/25/17 0307 11/25/17 0400 11/25/17 0506 11/25/17 0603 11/25/17 0719  GLUCAP 171* 167* 165* 190* 188* 152*    Imaging Ct Head Wo Contrast  Result Date: 11/24/2017 CLINICAL DATA:  Follow-up examination for stroke. EXAM: CT HEAD WITHOUT CONTRAST TECHNIQUE: Contiguous axial images were obtained from the base of the skull through the vertex without intravenous contrast. COMPARISON:  Prior CT from 11/19/2017 as well as earlier studies. FINDINGS: Brain: Normal expected interval evolution of previously identified pontine infarct. No evidence for hemorrhagic transformation or other complication. Adjacent basilar cisterns and fourth ventricle remain patent. No acute intracranial hemorrhage. No new large vessel territory infarct. No extra-axial fluid  collection. Ventricles stable in size without hydrocephalus. Vascular: Vertebrobasilar stent in place, stable. Calcified atherosclerotic change at the skull base again noted. No new hyperdense vessel. Skull: Scalp soft tissues and calvarium within normal limits. Sinuses/Orbits: Globes and orbital soft tissues normal. Moderate mucosal thickening throughout the paranasal sinuses. Fluid within the partially visualized nasopharynx. Bilateral mastoid effusions. Patient likely intubated. Other: None. IMPRESSION: 1. Continued normal expected interval evolution of brainstem infarct. No evidence for hemorrhagic transformation or other complication. 2. No other new intracranial abnormality. 3. Moderate paranasal sinus disease with bilateral mastoid effusions, likely related intubation. Electronically Signed   By: Jeannine Boga M.D.   On: 11/24/2017 15:21   Dg Chest Port 1 View  Result Date: 11/25/2017 CLINICAL DATA:  Intubation EXAM: PORTABLE CHEST 1 VIEW COMPARISON:  11/23/2017 FINDINGS: Endotracheal tube and nasogastric catheter are noted in satisfactory position. Right-sided PICC line is again seen in satisfactory position. Patient is significantly rotated to the right accentuating the mediastinal markings. Mild vascular congestion is noted without focal confluent infiltrate. IMPRESSION: Mild vascular congestion. Tubes and lines as described. Electronically Signed   By: Inez Catalina M.D.   On: 11/25/2017 07:56        CULTURES: 11/18/2017 sputum>>  ANTIBIOTICS: 11/18/2017 Unasyn>> SIGNIFICANT EVENTS: 11/18/2017 intubated  LINES/TUBES: 11/18/2017 endotracheal tube>>  DISCUSSION: Mr. Reels  is a 62 year old male with a plethora of health issues who presented to [facility] on 11/18/2017 after contacting 911 when he noted slurred speech and right facial.  He was transported to [facility] emergency department code stroke was initiated.  He had been on Eliquis in the past for paroxysmal atrial  fibrillation have been stopped 3 weeks prior due to hemoptysis. 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  There was concern that TPA could not be given due to recent hemoptysis and erosion of the thyroid nodule into the airway. He was urgently intubated by Dr. Nelda Marseille and erosion site was covered with the balloon cuff.  Patient was then given TPA and will be transferred to the neuro  stroke unit. He will most likely remain intubated for period of 24 to 48 hours.  He will need further evaluation for most likely cancerous process in the neck and the lung.  ASSESSMENT / PLAN:  PULMONARY A: Vent dependent respiratory failure.  Fortunately he is no longer having any difficulties with hemoptysis.  And continuing empiric cefotetan for aspiration but we have no positive cultures and I anticipate discontinuing that agent in the morning. Chronic obstructive pulmonary disease Former smoker Multiple lung nodules suspicious for cancer  Apparently the source of bleeding was 3 cm below the cords on FOB. We will be attempting to decrease his Precedex today and if he maintains ventilator synchrony transfer some work of breathing to the patient  CARDIOVASCULAR A:  History of  Paroxysmal atrial fibrillation, currently off anticoagulation for recent hemoptysis of 3 weeks duration We have reasonable rate control with Coreg.       Coronary artery disease.  He is currently on dual antiplatelet therapy and I have increased his dose of statin to an appropriate dose for secondary prevention following his CVA. Peripheral vascular disease   RENAL  A:   No acute renal issues P:   Monitor electrolytes   Thyroid Mass The patient likely has metastatic thyroid cancer. The thyroid may have eroded into the trachea causing his intermittent hemoptysis.  I have spoken with both ENT and thoracic surgery who feel that any sort of  topical treatment of the erosion into the trachea is only going to provide transient hemostasis and that he would really require definitive excision to control bleeding into the airway long-term.  I feel far more compelled to ensure that he is stable from a central nervous system perspective and I do feel an urgency to diagnose the mass at this point.  I am hopeful that we will be able to obtain a skinny needle biopsy later in the week.   GASTROINTESTINAL A:   History of GERD P:   Proton pump inhibitor Gastric tube  Will start OGT feeds  HEMATOLOGIC A:   Chronic anticoagulation for atrial fibrillation Off anticoagulation for 3 weeks due to her hemoptysis Fortunately we had no further hemoptysis overnight.  He continues on dual antiplatelet therapy for his newly placed vertebral and basilar artery stent  INFECTIOUS A:   Observed aspiration P:   We have no infectious source for his recent fevers.  He likely has bland aspiration with blood. His white count has not been elevated he is has not had a left shift and his procalcitonin is unimpressive.  We have no positive cultures.  We will continue to cool him for the next several days until we are beyond a point where we are concerned about a penumbra at risk.    ENDOCRINE CBG (last 3)  Recent Labs    11/25/17 0506 11/25/17 0603 11/25/17 0719  GLUCAP 190* 188* 152*    A:   Diabetes mellitus P:   IV sliding scale insulin  PAD Patient had a cool right foot which resolved with removing his right femoral sheath.    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 This is day 7 status post infarct and the patient is febrile.  Continue targeted temperature management to preserve any penumbra at risk.   FAMILY    Greater than 32 minutes was spent in the care of this patient today, who is suffering multiple concurrent medical  issues with  potentially life-threatening instability   Lars Masson, MD Critical Care Medicine

## 2017-11-26 ENCOUNTER — Inpatient Hospital Stay (HOSPITAL_COMMUNITY): Payer: 59

## 2017-11-26 LAB — BASIC METABOLIC PANEL
ANION GAP: 6 (ref 5–15)
BUN: 17 mg/dL (ref 8–23)
CALCIUM: 9.1 mg/dL (ref 8.9–10.3)
CHLORIDE: 104 mmol/L (ref 98–111)
CO2: 27 mmol/L (ref 22–32)
Creatinine, Ser: 0.55 mg/dL — ABNORMAL LOW (ref 0.61–1.24)
GFR calc non Af Amer: >60 mL/min (ref >60)
Glucose, Bld: 236 mg/dL — ABNORMAL HIGH (ref 70–99)
POTASSIUM: 4 mmol/L (ref 3.5–5.1)
Sodium: 137 mmol/L (ref 135–145)

## 2017-11-26 LAB — GLUCOSE, CAPILLARY
GLUCOSE-CAPILLARY: 249 mg/dL — AB (ref 70–99)
GLUCOSE-CAPILLARY: 239 mg/dL — AB (ref 70–99)
GLUCOSE-CAPILLARY: 218 mg/dL — AB (ref 70–99)
GLUCOSE-CAPILLARY: 234 mg/dL — AB (ref 70–99)
Glucose-Capillary: 206 mg/dL — ABNORMAL HIGH (ref 70–99)
Glucose-Capillary: 208 mg/dL — ABNORMAL HIGH (ref 70–99)

## 2017-11-26 LAB — CBC WITH DIFFERENTIAL/PLATELET
ABS IMMATURE GRANULOCYTES: 0.1 10*3/uL (ref 0.0–0.1)
Basophils Absolute: 0 10*3/uL (ref 0.0–0.1)
Basophils Relative: 0 %
Eosinophils Absolute: 0.5 10*3/uL (ref 0.0–0.7)
Eosinophils Relative: 6 %
HEMATOCRIT: 31.1 % — AB (ref 39.0–52.0)
HEMOGLOBIN: 9.6 g/dL — AB (ref 13.0–17.0)
IMMATURE GRANULOCYTES: 1 %
LYMPHS ABS: 1.4 10*3/uL (ref 0.7–4.0)
LYMPHS PCT: 17 %
MCH: 27.9 pg (ref 26.0–34.0)
MCHC: 30.9 g/dL (ref 30.0–36.0)
MCV: 90.4 fL (ref 78.0–100.0)
Monocytes Absolute: 0.5 10*3/uL (ref 0.1–1.0)
Monocytes Relative: 6 %
NEUTROS ABS: 5.8 10*3/uL (ref 1.7–7.7)
NEUTROS PCT: 70 %
Platelets: 234 10*3/uL (ref 150–400)
RBC: 3.44 MIL/uL — AB (ref 4.22–5.81)
RDW: 13.2 % (ref 11.5–15.5)
WBC: 8.2 10*3/uL (ref 4.0–10.5)

## 2017-11-26 LAB — BLOOD GAS, ARTERIAL
Acid-Base Excess: 0.8 mmol/L (ref 0.0–2.0)
BICARBONATE: 24.5 mmol/L (ref 20.0–28.0)
DRAWN BY: 347621
FIO2: 40
LHR: 26 {breaths}/min
MECHVT: 580 mL
O2 Saturation: 97.4 %
PATIENT TEMPERATURE: 97.8
PEEP: 8 cmH2O
pCO2 arterial: 35.7 mmHg (ref 32.0–48.0)
pH, Arterial: 7.449 (ref 7.350–7.450)
pO2, Arterial: 89.6 mmHg (ref 83.0–108.0)

## 2017-11-26 MED ORDER — INSULIN GLARGINE 100 UNIT/ML ~~LOC~~ SOLN
24.0000 [IU] | Freq: Every day | SUBCUTANEOUS | Status: DC
  Filled 2017-11-26: qty 0.24

## 2017-11-26 MED ORDER — FUROSEMIDE 10 MG/ML IJ SOLN
10.0000 mg | Freq: Two times a day (BID) | INTRAMUSCULAR | Status: DC
  Administered 2017-11-26 – 2017-11-27 (×2): 10 mg via INTRAVENOUS
  Filled 2017-11-26 (×2): qty 2

## 2017-11-26 MED ORDER — CLONAZEPAM 0.1 MG/ML ORAL SUSPENSION: 1.0000 mg | Freq: Two times a day (BID) | ORAL | Status: DC

## 2017-11-26 MED ORDER — IOHEXOL 300 MG/ML  SOLN
100.0000 mL | Freq: Once | INTRAMUSCULAR | Status: AC | PRN
  Administered 2017-11-26: 100 mL via INTRAVENOUS

## 2017-11-26 MED ORDER — INSULIN ASPART 100 UNIT/ML ~~LOC~~ SOLN
0.0000 [IU] | SUBCUTANEOUS | Status: DC
  Administered 2017-11-26 – 2017-11-27 (×6): 7 [IU] via SUBCUTANEOUS
  Administered 2017-11-27 – 2017-11-28 (×6): 11 [IU] via SUBCUTANEOUS
  Administered 2017-11-28 (×2): 7 [IU] via SUBCUTANEOUS
  Administered 2017-11-28 – 2017-11-29 (×2): 11 [IU] via SUBCUTANEOUS
  Administered 2017-11-29: 7 [IU] via SUBCUTANEOUS
  Administered 2017-11-29 (×3): 4 [IU] via SUBCUTANEOUS
  Administered 2017-11-29: 7 [IU] via SUBCUTANEOUS
  Administered 2017-11-30 (×2): 11 [IU] via SUBCUTANEOUS
  Administered 2017-11-30 – 2017-12-01 (×9): 7 [IU] via SUBCUTANEOUS
  Administered 2017-12-01: 4 [IU] via SUBCUTANEOUS
  Administered 2017-12-02: 7 [IU] via SUBCUTANEOUS
  Administered 2017-12-02: 4 [IU] via SUBCUTANEOUS
  Administered 2017-12-02 (×3): 7 [IU] via SUBCUTANEOUS
  Administered 2017-12-02: 4 [IU] via SUBCUTANEOUS
  Administered 2017-12-03: 11 [IU] via SUBCUTANEOUS
  Administered 2017-12-03 (×4): 7 [IU] via SUBCUTANEOUS
  Administered 2017-12-03 – 2017-12-04 (×2): 11 [IU] via SUBCUTANEOUS
  Administered 2017-12-04: 7 [IU] via SUBCUTANEOUS
  Administered 2017-12-04: 11 [IU] via SUBCUTANEOUS
  Administered 2017-12-04: 7 [IU] via SUBCUTANEOUS
  Administered 2017-12-04: 11 [IU] via SUBCUTANEOUS
  Administered 2017-12-04: 7 [IU] via SUBCUTANEOUS
  Administered 2017-12-05: 11 [IU] via SUBCUTANEOUS
  Administered 2017-12-05 (×3): 7 [IU] via SUBCUTANEOUS
  Administered 2017-12-05 (×2): 11 [IU] via SUBCUTANEOUS

## 2017-11-26 MED ORDER — CLONAZEPAM 0.5 MG PO TBDP
1.0000 mg | ORAL_TABLET | Freq: Two times a day (BID) | ORAL | Status: DC
  Administered 2017-11-26 – 2017-11-27 (×4): 1 mg via ORAL
  Filled 2017-11-26 (×4): qty 2

## 2017-11-26 MED ORDER — INSULIN GLARGINE 100 UNIT/ML ~~LOC~~ SOLN
24.0000 [IU] | Freq: Every day | SUBCUTANEOUS | Status: DC
  Administered 2017-11-26: 24 [IU] via SUBCUTANEOUS
  Filled 2017-11-26: qty 0.24

## 2017-11-26 NOTE — Progress Notes (Signed)
STROKE TEAM PROGRESS NOTE   SUBJECTIVE (INTERVAL HISTORY) His girlfriend, brother and sister-in-law are at bedside. Pt again has respiratory stress and fighting with vent while off fentanyl. Had extensive family meeting this am with CCM, ENT together. Plan to do the lymph node vs. Thyroid biopsy and try to wean off vent. Potential trach in the future if needed.     OBJECTIVE Temp:  [97.9 F (36.6 C)-99.6 F (37.6 C)] 97.9 F (36.6 C) (07/05 0700) Pulse Rate:  [84-109] 95 (07/05 1119) Cardiac Rhythm: Atrial fibrillation (07/04 2000) Resp:  [17-30] 30 (07/05 1119) BP: (90-129)/(48-81) 113/67 (07/05 1119) SpO2:  [93 %-100 %] 94 % (07/05 1136) FiO2 (%):  [40 %] 40 % (07/05 1136) Weight:  [356 lb 4.2 oz (161.6 kg)] 356 lb 4.2 oz (161.6 kg) (07/05 0500)  CBC:  Recent Labs  Lab 11/25/17 0403 11/26/17 0631  WBC 10.0 8.2  NEUTROABS 7.6 5.8  HGB 9.9* 9.6*  HCT 32.6* 31.1*  MCV 93.1 90.4  PLT 226 993    Basic Metabolic Panel:  Recent Labs  Lab 11/21/17 0500  11/22/17 0116  11/25/17 0403 11/26/17 0631  NA  --    < > 140   < > 141 137  K  --    < > 3.4*   < > 3.8 4.0  CL  --    < > 111   < > 108 104  CO2  --    < > 23   < > 25 27  GLUCOSE  --    < > 198*   < > 217* 236*  BUN  --    < > 16   < > 19 17  CREATININE  --    < > 0.61   < > 0.52* 0.55*  CALCIUM  --    < > 8.2*   < > 9.3 9.1  MG 2.2  --  2.1  --   --   --   PHOS 2.8  --  2.9  --   --   --    < > = values in this interval not displayed.    Lipid Panel:     Component Value Date/Time   CHOL 110 11/19/2017 0437   CHOL 119 08/09/2017 1000   TRIG 340 (H) 11/19/2017 0437   HDL 19 (L) 11/19/2017 0437   HDL 29 (L) 08/09/2017 1000   CHOLHDL 5.8 11/19/2017 0437   VLDL 68 (H) 11/19/2017 0437   LDLCALC 23 11/19/2017 0437   LDLCALC 30 08/09/2017 1000   HgbA1c:  Lab Results  Component Value Date   HGBA1C 9.1 (H) 11/19/2017   Urine Drug Screen: No results found for: LABOPIA, COCAINSCRNUR, LABBENZ, AMPHETMU, THCU,  LABBARB  Alcohol Level No results found for: [place] I have personally reviewed the radiological images below and agree with the radiology interpretations.  Ct Angio Head W Or Wo Contrast  Ct Angio Neck W Or Wo Contrast 11/18/2017 IMPRESSION:  Since the study of earlier today, there are only 2 changes. There is new consolidation and volume loss in the right upper lobe. 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 Flow is seen distal to that however, presumably indicating patent posterior communicating arteries.   Ct Angio Head W Or Wo Contrast Ct Angio Neck W Or Wo Contrast 11/18/2017 IMPRESSION:  1. Severe intracranial and extracranial atherosclerotic disease. No emergent large vessel occlusion  2. Severe stenosis right carotid bifurcation due to calcified plaque, estimated 90% diameter stenosis.  No significant left carotid stenosis. Severe stenosis cavernous carotid bilaterally.  3. Both vertebral arteries are patent in the neck with severe calcific stenosis V4 segment bilaterally.  4. 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 Small calcified lymph nodes left neck suspicious for metastatic disease  5. Small lung nodules on the right, possible metastatic disease. Recommend chest CT for further evaluation.   Ct Head Wo Contrast 11/19/2017 IMPRESSION:  1. Expected evolution of brainstem infarct without hemorrhage.  2. No new infarcts.  3. Left vertebral artery and basilar artery stent.  4. Acute on chronic sinus disease may be related to intubation.   Ct Chest W Contrast 11/18/2017 IMPRESSION Right upper lobe airspace opacity is noted with air bronchograms concerning for pneumonia or possibly atelectasis. Stable mediastinal adenopathy is noted compared to prior exam. Endotracheal and nasogastric tubes are in grossly good position. Coronary artery calcifications are noted suggesting coronary artery disease. Possible 2.8 cm left thyroid nodule. Thyroid  ultrasound is recommended for further evaluation. Aortic Atherosclerosis (ICD10-I70.0).   Mr Brain Wo Contrast 11/19/2017 IMPRESSION:  1. 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 No associated hemorrhage or significant mass effect.  2. Additional punctate acute/early subacute infarctions within left lateral cerebellum and right parietal cortex.  3. Minimal chronic microvascular ischemic changes of the brain and mild parenchymal volume loss.   Ct Head Code Stroke Wo Contrast 11/18/2017 IMPRESSION:  1. No acute intracranial abnormality.  Atherosclerotic calcification  2. ASPECTS is 10   Cerebral Angiogram / Stent  11/18/2017 1. 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 Occluded non dominant RT VBJ. 2. S/P endovascular revascularization of the distal basilar artery  To 90 % patency , and of 30 to 40 % of the LT VBJ with stent placement. Dominant  Lt Pcom.  Transthoracic Echocardiogram - Left ventricle: The cavity size was moderately dilated. Wall   thickness was increased in a pattern of severe LVH. Systolic   function was normal. The estimated ejection fraction was in the   range of 55% to 60%. Wall motion was normal; there were no   regional wall motion abnormalities. The study is not technically   sufficient to allow evaluation of LV diastolic function. - Aortic valve: Trileaflet. Sclerosis without stenosis. There was   no regurgitation. Valve area (Vmax): 2.88 cm^2. - Aorta: Aortic root ML diameter: 41.95 mm (ED). - Aortic root: The aortic root is mildly dilated. - Mitral valve: Calcified annulus. There was trivial regurgitation. - Left atrium: The atrium was mildly dilated. - Right ventricle: The cavity size was mildly dilated. Mildly   reduced systolic function. - Inferior vena cava: The vessel was normal in size. The    respirophasic diameter changes were in the normal range (>= 50%),   consistent with normal central venous pressure. Impressions: - Compared to a prior study in 02/2017, there have been no   significant changes.  Ct Head Wo Contrast 11/24/2017 CLINICAL DATA:  Follow-up examination for stroke. EXAM: CT HEAD WITHOUT CONTRAST TECHNIQUE: Contiguous axial images were obtained from the base of the skull through the vertex without intravenous contrast. COMPARISON:  Prior CT from 11/19/2017 as well as earlier studies. FINDINGS: Brain: Normal expected interval evolution of previously identified pontine infarct. No evidence for hemorrhagic transformation or other complication. Adjacent basilar cisterns and fourth ventricle remain patent. No acute  intracranial hemorrhage. No new large vessel territory infarct. No extra-axial fluid collection. Ventricles stable in size without hydrocephalus. Vascular: Vertebrobasilar stent in place, stable. Calcified atherosclerotic change at the skull base again noted. No new hyperdense vessel. Skull: Scalp soft tissues and calvarium within normal limits. Sinuses/Orbits: Globes and orbital soft tissues normal. Moderate mucosal thickening throughout the paranasal sinuses. Fluid within the partially visualized nasopharynx. Bilateral mastoid effusions. Patient likely intubated. Other: None. IMPRESSION: 1. Continued normal expected interval evolution of brainstem infarct. No evidence for hemorrhagic transformation or other complication. 2. No other new intracranial abnormality. 3. Moderate paranasal sinus disease with bilateral mastoid effusions, likely related intubation. Electronically Signed   By: Jeannine Boga M.D.   On: 11/24/2017 15:21   Dg Chest Port 1 View 11/25/2017 CLINICAL DATA:  Intubation EXAM: PORTABLE CHEST 1 VIEW COMPARISON:  11/23/2017 FINDINGS: Endotracheal tube and nasogastric catheter are noted in satisfactory position. Right-sided PICC line is again seen in  satisfactory position. Patient is significantly rotated to the right accentuating the mediastinal markings. Mild vascular congestion is noted without focal confluent infiltrate. IMPRESSION: Mild vascular congestion. Tubes and lines as described. Electronically Signed   By: Inez Catalina M.D.   On: 11/25/2017 07:56   Ct Soft Tissue Neck W Contrast  Result Date: 11/26/2017 CLINICAL DATA:  Palpable neck nodule versus thyroid enlargement. Pre biopsy evaluation. EXAM: CT NECK WITH CONTRAST TECHNIQUE: Multidetector CT imaging of the neck was performed using the standard protocol following the bolus administration of intravenous contrast. CONTRAST:  134mL OMNIPAQUE IOHEXOL 300 MG/ML  SOLN COMPARISON:  CT angiography 11/18/2017 FINDINGS: Pharynx and larynx: Endotracheal tube and orogastric tube in place. No mucosal lesion identified. Salivary glands: Parotid and submandibular glands are normal. Thyroid: As shown previously, there is a mass arising from the inferior thyroid on the left measuring approximately 5 x 3.4 x 5.5 cm with irregular margins and calcifications. There appears to be invasion of the superior mediastinum. Tumor partially encases the trachea and has a broad surface along the esophagus. Lymph nodes: Small calcified lower cervical lymph nodes are present. Supraclavicular nodes and superior mediastinal nodes are present as well, worrisome for extensive regional metastatic disease. Vascular: Carotid and vertebrobasilar atherosclerosis as seen previously. Recent placement of a basilar stent. Limited intracranial: No discernible acute intracranial finding. Visualized orbits: Negative Mastoids and visualized paranasal sinuses: Mucosal inflammatory changes of the paranasal sinuses. Skeleton: Ordinary cervical spondylosis. Upper chest: Bilateral layering pleural effusions with dependent atelectasis. Lung detail is limited. Other: None IMPRESSION: Aside from the fact that the patient shows endotracheal intubation  and the presence of an orogastric tube, and aside from development of bilateral layering pleural effusions with dependent atelectasis, the findings are unchanged since the previous studies. Irregular mass arising from the inferior left thyroid lobe, measuring approximately 5 x 3.4 x 5.5 cm, with invasion of the surrounding tissues worrisome for thyroid carcinoma. Partial encasement of the trachea. Broad surface along the esophagus. Small but likely pathologic nodes in the lower neck and superior mediastinum, some with calcification, probably involved by metastatic disease. Electronically Signed   By: Nelson Chimes M.D.   On: 11/26/2017 11:35    PHYSICAL EXAM  Temp:  [97.9 F (36.6 C)-99.6 F (37.6 C)] 97.9 F (36.6 C) (07/05 0700) Pulse Rate:  [84-109] 95 (07/05 1119) Resp:  [17-30] 30 (07/05 1119) BP: (90-129)/(48-81) 113/67 (07/05 1119) SpO2:  [93 %-100 %] 94 % (07/05 1136) FiO2 (%):  [40 %] 40 % (07/05 1136) Weight:  [356 lb 4.2 oz (161.6  kg)] 356 lb 4.2 oz (161.6 kg) (07/05 0500)  General - morbid obesity, well developed, intubated on sedation.  Ophthalmologic - fundi not visualized due to noncooperation.  Cardiovascular - irregularly irregular heart rate and rhythm, afib RVR.  Neuro - intubated on sedation, in respiratory distress, fighting with vent, not following simple commands. Eyes open but not blinking to visual threat, pinpoint pupils, not reactive to light, eye disconjugate with right eye inward position, right eye mid position, sluggish dolls eyes and no corneal but positive gag. Not blinking to visual threat bilaterally. Facial symmetry not able to test due to ET tube, not able to move tongue or mouth on command. No movement on all extremities, DTR diminished and no babinski. Sensation, coordination and gait not tested.    ASSESSMENT/PLAN Mr. Akio Hudnall is a 62 y.o. male with PMH of DM, hemoptysis, OSA, HTN, HLD, CAD, afib off Eliquis due to hemoptysis presenting with  slurred speech, right sided weakness. Was intubated for hemoptysis prevention while giving tPA. Symptoms worsened and imaging confirmed BA occlusion. S/p IR with revascularization of BA as well as left VA s/p stenting.  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   MRI head - large acute/early subacute infarction within the central pons involving both sides, greater on the right.  CTA H&N - severe vascular athero including BA stenosis, b/l VA stenosis, right ICA proximal 90% stenosis, b/l cavernous ICA stenosis R>L.  Repeat CTA H/N - new distal BA occlusion  DSA - revascularization of the distal basilar artery to 90 % patency , and of 30 to 40 % of the LT VBJ with stent placement.  CT head 11/24/17 showed normal evolution of brainstem infarct, no acute change  2D Echo - EF 55-60%  LDL - 23  HgbA1c - 9.1  VTE prophylaxis - heparin subq  Eliquis (recently stopped) and plavix prior to admission, now on aspirin 81 mg daily and Brilinta . Hold off AC for now due to hemoptysis and pending biopsy.   Ongoing aggressive stroke risk factor management  Therapy recommendations:  pending  Disposition:  Pending  Thyroid mass  CTA neck - right large thyroid mass compressing on trachea.   Thyroid mass larger than CT chest in 08/2016  Evidence of tracheal erosion on bronch at this admission  CT neck with contrast showed thyroid mass concerning for carcinoma, encasement of trachea.   Pending thyroid vs. Lymph node biopsy with IR next week  Based on thyroid biopsy, will consider oncology  consult  Respiratory failure  CCM on board  Current intubated on sedation  no cuff leak  Fighting with vent when off sedation  Likely need trach if not able to wean off vent  Do not think brilinta is a contraindication for trach  Discussed with Dr. Estanislado Pandy, if absolutely needed, can consider stop brilinta for trach with heparin bridge.  PAF  Currently in afib RVR with respiratory distress  On eliquis in the past but off due to hemoptysis  Hold off at this time due to hemoptysis and upcoming biopsy  On ASA and brilinta  Coreg bid for rate control  Cerebral vascular athero  CTA head and neck showed right ICA proximal 90% stenosis, left ICA proximal athero, b/l cavernous ICA severe athro R>L, BA severe stenosis, b/l V4 severe athero with right V4 occlusion  S/p left V4 stent and BA revascularization  On ASA and brilinta  Hemoptysis, improved  Since 08/2016 as per family  Not sure about etiology  TB ruled out in the past  Possible tracheal erosion from right thyroid mass   CT chest showed scattered long nodules, RUE suspected PNA  CT neck showed thyroid mass concerning for carcinoma, encasement of trachea.   Plan for thyroid vs. Lymph node biopsy next week  CCM on board  Fever   Tmax 102.4->102.5->100.0->98.3  CXR - progression of bibasilar consolidation -> Mild vascular congestion is noted without focal confluent infiltrate  continue IV cefotan  On cooling blanket PRN  Hypertension  Stable on the low side  Now on coreg bid  Also on HCTZ for limb swelling . Long term BP goal normotensive.  Hyperlipidemia  Lipid lowering medication PTA: Crestor 10 mg daily  LDL 23, goal < 70  On Crestor  Continue statin at discharge  Diabetes  HgbA1c 9.1, goal < 7.0  Uncontrolled  Off insulin drip, CBG fluctuate  On lantus and SSI   CBG monitoring  Other Stroke Risk Factors  Advanced age  Former cigarette smoker - quit, 40 pack year  history  Morbid obesity, Body mass index is 51.12 kg/m.    Hx stroke/TIA  Coronary artery disease   PAD with claudication  OSA on CPAP  Other Active Problems  Leukocytosis WBC 13.1->9.6->10.7->8.2->8.1->10.0->8.2   Hospital day # 8  This patient is critically ill due to stroke, PAF, thyroid mass, hemoptysis, fever, severe vascular athero and at significant risk of neurological worsening, death form recurrent stroke, hemorrhagic conversion, heart failure, bleeding, shock. This patient's care requires constant monitoring of vital signs, hemodynamics, respiratory and cardiac monitoring, review of multiple databases, neurological assessment, discussion with family, other specialists and medical decision making of high complexity. I spent 55 minutes of neurocritical care time in the care of this patient. We had extensive family meeting today with girlfriend, brother, sister in law, Dr. Pearline Cables from [place], and Dr. Wilburn Cornelia from ENT, answered family questions and discussed about further plan.   Rosalin Hawking, MD PhD Stroke Neurology 11/26/2017 11:55 AM   To contact Stroke Continuity provider, please refer to [email or web address] After hours, contact General Neurology

## 2017-11-26 NOTE — Progress Notes (Signed)
Patient ID: [name] [name], male   DOB: 10/04/1955, 62 y.o.   MRN: [id number]   IR is aware of request for LAN vs thyroid biopsy. Discussed with Dr Barbie Banner He is uncomfortable with bx ON Brilinta  CT Neck performed today: IMPRESSION: Aside from the fact that the patient shows endotracheal intubation and the presence of an orogastric tube, and aside from development of bilateral layering pleural effusions with dependent atelectasis, the findings are unchanged since the previous studies. Irregular mass arising from the inferior left thyroid lobe, measuring approximately 5 x 3.4 x 5.5 cm, with invasion of the surrounding tissues worrisome for thyroid carcinoma. Partial encasement of the trachea. Broad surface along the esophagus. Small but likely pathologic nodes in the lower neck and superior mediastinum, some with calcification, probably involved by metastatic disease.  He has asked me to review CT with Dr Irving Copas am  Will review Mon am Will let Dr Pearline Cables plan asap.Marland KitchenMarland Kitchen

## 2017-11-26 NOTE — Progress Notes (Signed)
RT transported patient on ventilator from room 4N19 to CT and back to room 0H21 with no complications. Vitals are stable. RT will continue to monitor.

## 2017-11-26 NOTE — Progress Notes (Signed)
PULMONARY / CRITICAL CARE MEDICINE   Name: [name] [name] MRN: [id number] DOB: [birth year]    ADMISSION DATE:  11/18/2017 CONSULTATION DATE: 11/18/2017  REFERRING MD: Emergency department  CHIEF COMPLAINT: Stroke  HISTORY OF PRESENT ILLNESS:    [name] [name]  is a 62 year old male with a plethora of health issues who presented to [facility] on 11/18/2017 after contacting 911 when he noted slurred speech and right facial.  He was transported to [facility] emergency department code stroke was initiated.  He had been on Eliquis in the past for paroxysmal atrial fibrillation have been stopped 3 weeks prior due to hemoptysis. 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  There was concern that TPA could not be given due to recent hemoptysis and erosion of the thyroid nodule into the airway. He was urgently intubated by Dr. Nelda Marseille and erosion site was covered with the balloon cuff.  Patient was then given TPA and will be transferred to the neuro stroke unit. He will most likely remain intubated for period of 24 to 48 hours.  He will need further evaluation for most likely cancerous process in the neck and the lung.  7/5 This is post CVA day 8.  He is no longer being actively cooled.  He continues to be sedated on a combination of fentanyl and Precedex and when the Precedex was decreased this morning he developed ventilator asynchrony.  According to family he has been nodding intermittently to their questions he does not do so for me but he is deeply sedated during my exam.  He remains intubated and mechanically ventilated.    PAST MEDICAL HISTORY :  He  has a past medical history of Collagen vascular disease ([place]), Coronary artery disease, GERD (gastroesophageal reflux disease), High cholesterol, Hypertension, OSA (obstructive sleep apnea), Pneumonia (2011), and Type II diabetes mellitus ([place])  (07/2016).  PAST SURGICAL HISTORY: He  has a past surgical history that includes Video bronchoscopy (Bilateral, 08/25/2016); Lower Extremity Angiography (Bilateral, 04/01/2017); PERIPHERAL VASCULAR INTERVENTION (Left, 04/01/2017); Coronary angioplasty with stent (2011; 07/2016); Radiology with anesthesia (N/A, 11/18/2017); IR ANGIO EXTRACRAN SEL COM CAROTID INNOMINATE UNI BILAT MOD SED (11/18/2017); IR ANGIO VERTEBRAL SEL SUBCLAVIAN INNOMINATE UNI R MOD SED (11/18/2017); and IR Intra Cran Stent (11/18/2017).  No Known Allergies  No current facility-administered medications on file prior to encounter.    Current Outpatient Medications on File Prior to Encounter  Medication Sig  . apixaban (ELIQUIS) 5 MG TABS tablet Take 1 tablet (5 mg total) 2 (two) times daily by mouth.  . dronedarone (MULTAQ) 400 MG tablet Take 1 tablet (400 mg total) 2 (two) times daily with a meal by mouth.  . irbesartan (AVAPRO) 75 MG tablet TAKE ONE TABLET BY MOUTH ONE TIME DAILY   . liraglutide (VICTOZA) 18 MG/3ML SOPN Inject 0.2 mLs (1.2 mg total) into the skin daily at 10 pm. Start with 0.6 mg for one week, then increase to 1.2 mg daily.  . metoprolol tartrate (LOPRESSOR) 50 MG tablet Take 1 tablet (50 mg total) by mouth 2 (two) times daily.  . Omega-3 Fatty Acids (FISH OIL) 1000 MG CAPS Take 2 capsules (2,000 mg total) by mouth 2 (two) times daily.  . pantoprazole (PROTONIX) 40 MG tablet TAKE 1 TABLET BY MOUTH ONCE A DAY 30 TO 60 MINUTES BEFORE FIRST MEAL OF THE DAY   . B Complex Vitamins (VITAMIN B COMPLEX PO) Take 1 capsule at bedtime  by mouth.   . clopidogrel (PLAVIX) 75 MG tablet Take 1 tablet (75 mg total) by mouth daily. (Patient not taking: Reported on 11/20/2017)  . famotidine (PEPCID) 20 MG tablet One at bedtime  . fenofibrate 160 MG tablet Take 1 tablet (160 mg total) by mouth daily.  . metFORMIN (GLUCOPHAGE) 1000 MG tablet Take 1,000 mg by mouth 2 (two) times daily with a meal.  . nitroGLYCERIN (NITROSTAT) 0.4 MG SL  tablet Place 1 tablet (0.4 mg total) under the tongue every 5 (five) minutes as needed for chest pain.  . rosuvastatin (CRESTOR) 10 MG tablet Take 5 mg by mouth at bedtime.    FAMILY HISTORY:  His indicated that his mother is alive. He indicated that his father is deceased. He indicated that his sister is deceased. He indicated that his brother is alive.   SOCIAL HISTORY: He  reports that he quit smoking about 5 years ago. His smoking use included cigarettes. He has a 40.00 pack-year smoking history. He has never used smokeless tobacco. He reports that he drinks alcohol. He reports that he does not use drugs.  REVIEW OF SYSTEMS:   Not available  SUBJECTIVE:  Intubated stable mechanical ventilatory support  VITAL SIGNS: BP 113/67   Pulse 95   Temp 97.9 F (36.6 C) (Axillary)   Resp (!) 30   Ht 5\' 10"  (1.778 m)   Wt (!) 356 lb 4.2 oz (161.6 kg)   SpO2 94%   BMI 51.12 kg/m   HEMODYNAMICS:    VENTILATOR SETTINGS: Vent Mode: PRVC FiO2 (%):  [40 %] 40 % Set Rate:  [26 bmp] 26 bmp Vt Set:  [580 mL] 580 mL PEEP:  [8 cmH20] 8 cmH20 Plateau Pressure:  [15 cmH20-25 cmH20] 24 cmH20  INTAKE / OUTPUT: I/O last 3 completed shifts: In: 4393.2 [I.V.:1803.2; NG/GT:2390; IV Piggyback:200] Out: 3600 [Urine:3600]  PHYSICAL EXAMINATION: General: This is an obese middle-aged male who is orally intubated and mechanically ventilated.       Neuro: Exam is on Precedex and fentanyl.  Does partially eye open to voice but does not nod to questions or follow instructions for me.    He is not moving his limbs to noxious stimuli.  Pupils are equal and reactive and EOMs appear to be full.    HEENT: Has a remarkably short thick neck with a very short supplemental distance.  I cannot appreciate any adenopathy.     Cardiovascular: S1 and S2 are somewhat distant and irregularly irregular without murmur rub or gallop.  He has trace dependent edema.   Feet are symmetrically warm.   Lungs: Respirations are  currently unlabored, there is symmetric air movement, few scattered rhonchi and no wheezes.  He is breathing above the set ventilator rate.   Abdomen: Abdomen is obese soft and nontender.  There are no masses organomegaly.    Recent Labs  Lab 11/24/17 0241 11/25/17 0403 11/26/17 0631  NA 142 141 137  K 4.2 3.8 4.0  CL 114* 108 104  CO2 24 25 27   BUN 23 19 17   CREATININE 0.61 0.52* 0.55*  GLUCOSE 165* 217* 236*    Electrolytes Recent Labs  Lab 11/20/17 1633 11/21/17 0500  11/22/17 0116  11/24/17 0241 11/25/17 0403 11/26/17 0631  CALCIUM  --   --    < > 8.2*   < > 8.6* 9.3 9.1  MG 2.2 2.2  --  2.1  --   --   --   --   PHOS 3.0  2.8  --  2.9  --   --   --   --    < > = values in this interval not displayed.    CBC Recent Labs  Lab 11/24/17 0517 11/25/17 0403 11/26/17 0631  WBC 8.1 10.0 8.2  HGB 8.7* 9.9* 9.6*  HCT 28.9* 32.6* 31.1*  PLT 197 226 234    Coag's Recent Labs  Lab 11/21/17 2311 11/24/17 0241  APTT 34  --   INR 1.12 1.20    Sepsis Markers Recent Labs  Lab 11/23/17 0954 11/24/17 0241 11/25/17 0403  PROCALCITON 0.12 0.16 0.13    ABG Recent Labs  Lab 11/24/17 0335 11/25/17 0350 11/26/17 0350  PHART 7.404 7.443 7.449  PCO2ART 37.1 35.2 35.7  PO2ART 143* 85.1 89.6    Liver Enzymes Recent Labs  Lab 11/25/17 0403  AST 14*  ALT 15  ALKPHOS 43  BILITOT 0.6  ALBUMIN 2.0*    Cardiac Enzymes No results for input(s): TROPONINI, PROBNP in the last 168 hours.  Glucose Recent Labs  Lab 11/25/17 2008 11/25/17 2109 11/25/17 2208 11/25/17 2313 11/26/17 0405 11/26/17 0725  GLUCAP 153* 154* 189* 168* 206* 249*    Imaging Ct Soft Tissue Neck W Contrast  Result Date: 11/26/2017 CLINICAL DATA:  Palpable neck nodule versus thyroid enlargement. Pre biopsy evaluation. EXAM: CT NECK WITH CONTRAST TECHNIQUE: Multidetector CT imaging of the neck was performed using the standard protocol following the bolus administration of intravenous  contrast. CONTRAST:  128mL OMNIPAQUE IOHEXOL 300 MG/ML  SOLN COMPARISON:  CT angiography 11/18/2017 FINDINGS: Pharynx and larynx: Endotracheal tube and orogastric tube in place. No mucosal lesion identified. Salivary glands: Parotid and submandibular glands are normal. Thyroid: As shown previously, there is a mass arising from the inferior thyroid on the left measuring approximately 5 x 3.4 x 5.5 cm with irregular margins and calcifications. There appears to be invasion of the superior mediastinum. Tumor partially encases the trachea and has a broad surface along the esophagus. Lymph nodes: Small calcified lower cervical lymph nodes are present. Supraclavicular nodes and superior mediastinal nodes are present as well, worrisome for extensive regional metastatic disease. Vascular: Carotid and vertebrobasilar atherosclerosis as seen previously. Recent placement of a basilar stent. Limited intracranial: No discernible acute intracranial finding. Visualized orbits: Negative Mastoids and visualized paranasal sinuses: Mucosal inflammatory changes of the paranasal sinuses. Skeleton: Ordinary cervical spondylosis. Upper chest: Bilateral layering pleural effusions with dependent atelectasis. Lung detail is limited. Other: None IMPRESSION: Aside from the fact that the patient shows endotracheal intubation and the presence of an orogastric tube, and aside from development of bilateral layering pleural effusions with dependent atelectasis, the findings are unchanged since the previous studies. Irregular mass arising from the inferior left thyroid lobe, measuring approximately 5 x 3.4 x 5.5 cm, with invasion of the surrounding tissues worrisome for thyroid carcinoma. Partial encasement of the trachea. Broad surface along the esophagus. Small but likely pathologic nodes in the lower neck and superior mediastinum, some with calcification, probably involved by metastatic disease. Electronically Signed   By: Nelson Chimes M.D.   On:  11/26/2017 11:35        CULTURES: 11/18/2017 sputum>>  ANTIBIOTICS: 11/18/2017 Unasyn>> SIGNIFICANT EVENTS: 11/18/2017 intubated  LINES/TUBES: 11/18/2017 endotracheal tube>>  DISCUSSION: Mr. Riese  is a 62 year old male with a plethora of health issues who presented to [facility] on 11/18/2017 after contacting 911 when he noted slurred speech and right facial.  He was transported to [facility] emergency department code stroke  was initiated.  He had been on Eliquis in the past for paroxysmal atrial fibrillation have been stopped 3 weeks prior due to hemoptysis. 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  There was concern that TPA could not be given due to recent hemoptysis and erosion of the thyroid nodule into the airway. He was urgently intubated by Dr. Nelda Marseille and erosion site was covered with the balloon cuff.  Patient was then given TPA and will be transferred to the neuro stroke unit. He will most likely remain intubated for period of 24 to 48 hours.  He will need further evaluation for most likely cancerous process in the neck and the lung.  ASSESSMENT / PLAN:  PULMONARY A: Vent dependent respiratory failure.  Urgently we have had no further issues with hemoptysis.  I am continuing empiric cefotetan for aspiration but doubt that we are actually treating anything with that agent.  He does have a substantial AA gradient may be secondary to blood aspiration.  Should also be noted however that he has LVH on echo and is in atrial fibrillation so there may be a component of diastolic heart failure.  I have increased his diuretic today and will be repeating a chest film in the morning.  He again became asynchronous when sedation was decreased today.  I have added a small dose of Klonopin to control anxiety and will attempt to decrease his sedation tomorrow morning after switching to pressure  control ventilation to see if this is not better tolerated.  His minute ventilation is sufficiently high at present that I think separation from mechanical ventilation is not realistically possible. Chronic obstructive pulmonary disease Former smoker Multiple lung nodules suspicious for cancer    CARDIOVASCULAR A:  History of  Paroxysmal atrial fibrillation, currently off anticoagulation for recent hemoptysis of 3 weeks duration We have reasonable rate control with Coreg.       Coronary artery disease.  He is currently on dual antiplatelet therapy and I have increased his dose of statin to an appropriate dose for secondary prevention following his CVA. Peripheral vascular disease   RENAL  A:   No acute renal issues P:   Monitor electrolytes   Thyroid Mass A repeat CT scan of the neck was obtained today to determine whether or not there is a lymph node that could be biopsied without compromising his airway in the event that there is bleeding   GASTROINTESTINAL A:   History of GERD P:   Proton pump inhibitor Gastric tube  Will start OGT feeds  HEMATOLOGIC A:   Chronic anticoagulation for atrial fibrillation Off anticoagulation for 3 weeks due to her hemoptysis Fortunately we had no further hemoptysis overnight.  He continues on dual antiplatelet therapy for his newly placed vertebral and basilar artery stent  INFECTIOUS A:   Observed aspiration P:   We have no infectious source for his recent fevers.  He likely has bland aspiration with blood. His white count has not been elevated, he has not had a left shift and his procalcitonin is unimpressive.  We have no positive cultures.     ENDOCRINE CBG (last 3)  Recent Labs    11/25/17 2313 11/26/17 0405 11/26/17 0725  GLUCAP 168* 206* 249*    A:   Diabetes mellitus P:   IV sliding scale insulin.  Sliding scale insulin was adjusted today as was his long-acting insulin to get better glycemic control  we are no longer on  an IV infusion  PAD Patient had a cool right foot which resolved with removing his right femoral sheath.    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 This is day 8 status post infarct.  He is no longer requiring targeted temperature management.  I am ensuring hemodynamic stability and getting better control of his hyperglycemia as adjunctive steps to optimizing neurologic outcome.  He continues on aspirin, Brilinta and a statin.   FAMILY    We had extensive discussions today with myself Dr. Erlinda Hong and Dr. Conley Canal from ENT to outline her plan of care and rationale for proceeding in the order we are proceeding.  I have again emphasized that our primary objective is to prevent further CNS insult and I again explained that he has a pontine infarction and that should it extend superiorly even a very small distance he will become unresponsive and should he extend inferiorly he will not breathe.  I stated this to accentuate the importance of taking every possible intervention to prevent further CNS insult.  For now we are going to continue him on aspirin and Plavix to prevent occlusion of his vertebrobasilar stent.  Her second issue is the mass in his thyroid which is eroding into his trachea.  We have again explained that we cannot do a major surgical procedure on aspirin and Brilinta.  I did discuss the situation with interventional radiology today who feels as though they may be able to biopsy a lymph node without potentially compromising his airway if there is bleeding.  A repeat CT of the neck has been ordered to evaluate this possibility.  I have made family aware that there are a number of different morphological possibilities including different types of thyroid cancer with different levels of aggressiveness of biologic behavior in addition to the possibility that this is metastatic disease from elsewhere.   They concur that if we find a tumor which would substantially limit his lifespan aggressive therapy in the hopes of recovering from a very serious CVA may not be indicated.  Lars Masson, MD Critical Care Medicine

## 2017-11-27 ENCOUNTER — Inpatient Hospital Stay (HOSPITAL_COMMUNITY): Payer: 59

## 2017-11-27 LAB — BLOOD GAS, ARTERIAL
ACID-BASE EXCESS: 2.1 mmol/L — AB (ref 0.0–2.0)
Bicarbonate: 25.5 mmol/L (ref 20.0–28.0)
DRAWN BY: 313941
FIO2: 40
O2 Saturation: 95.2 %
PEEP: 8 cmH2O
Patient temperature: 100.1
RATE: 26 resp/min
VT: 580 mL
pCO2 arterial: 37 mmHg (ref 32.0–48.0)
pH, Arterial: 7.458 — ABNORMAL HIGH (ref 7.350–7.450)
pO2, Arterial: 78 mmHg — ABNORMAL LOW (ref 83.0–108.0)

## 2017-11-27 LAB — GLUCOSE, CAPILLARY
GLUCOSE-CAPILLARY: 272 mg/dL — AB (ref 70–99)
GLUCOSE-CAPILLARY: 261 mg/dL — AB (ref 70–99)
GLUCOSE-CAPILLARY: 250 mg/dL — AB (ref 70–99)
Glucose-Capillary: 236 mg/dL — ABNORMAL HIGH (ref 70–99)
Glucose-Capillary: 265 mg/dL — ABNORMAL HIGH (ref 70–99)
Glucose-Capillary: 251 mg/dL — ABNORMAL HIGH (ref 70–99)

## 2017-11-27 LAB — BASIC METABOLIC PANEL
Anion gap: 8 (ref 5–15)
BUN: 19 mg/dL (ref 8–23)
CHLORIDE: 100 mmol/L (ref 98–111)
CO2: 28 mmol/L (ref 22–32)
Calcium: 9.4 mg/dL (ref 8.9–10.3)
Creatinine, Ser: 0.67 mg/dL (ref 0.61–1.24)
GFR calc non Af Amer: >60 mL/min (ref >60)
Glucose, Bld: 278 mg/dL — ABNORMAL HIGH (ref 70–99)
Potassium: 3.9 mmol/L (ref 3.5–5.1)
SODIUM: 136 mmol/L (ref 135–145)

## 2017-11-27 MED ORDER — VITAL HIGH PROTEIN PO LIQD
1000.0000 mL | ORAL | Status: DC
  Administered 2017-11-28: 1000 mL
  Administered 2017-11-28: 08:00:00
  Administered 2017-11-28 (×2): 30 mL/h
  Filled 2017-11-27: qty 1000

## 2017-11-27 MED ORDER — FUROSEMIDE 10 MG/ML IJ SOLN
20.0000 mg | Freq: Three times a day (TID) | INTRAMUSCULAR | Status: DC
  Administered 2017-11-27 – 2017-11-30 (×9): 20 mg via INTRAVENOUS
  Filled 2017-11-27 (×9): qty 2

## 2017-11-27 MED ORDER — VITAL HIGH PROTEIN PO LIQD
1000.0000 mL | ORAL | Status: DC
  Administered 2017-11-27: 1000 mL

## 2017-11-27 MED ORDER — INSULIN GLARGINE 100 UNIT/ML ~~LOC~~ SOLN
35.0000 [IU] | Freq: Every day | SUBCUTANEOUS | Status: DC
  Administered 2017-11-27: 35 [IU] via SUBCUTANEOUS
  Filled 2017-11-27: qty 0.35

## 2017-11-27 MED ORDER — DOCUSATE SODIUM 50 MG/5ML PO LIQD
100.0000 mg | Freq: Two times a day (BID) | ORAL | Status: DC
  Administered 2017-11-27 – 2017-12-12 (×26): 100 mg via ORAL
  Filled 2017-11-27 (×28): qty 10

## 2017-11-27 MED ORDER — VITAL HIGH PROTEIN PO LIQD: 1000.0000 mL | ORAL | Status: DC

## 2017-11-27 NOTE — Progress Notes (Signed)
RR decreased to 22 per MD instruction. Vitals are stable. RT will continue to monitor.

## 2017-11-27 NOTE — Plan of Care (Signed)
PAtient is tolerating ventilator and tube feedings at goal rate.

## 2017-11-27 NOTE — Progress Notes (Signed)
STROKE TEAM PROGRESS NOTE   HISTORY [name] [name] is an 62 y.o. male with HTN, DM2, HLD, afib ( was on eliquis) hemoptysis who presented to [facility] ED as a code stroke with left side weakness and slurred speech.   Per patient and girlfriend he woke up this morning and he felt normal. He walked to the door at 0600 to wave bye bye to his girlfriend. About 830 he called his girlfriend and was having slurred speech. She told him to call EMS. Patient states that it was about 0800 when he started " feeling funny". Patient has a history of coughing up blood in which he states he was removed from his eliquis 3 weeks ago. He reports that his last episode of coughing up blood was a few days ago. Denies any CP, SOB   In the ed:BG: 381, BP: was  161/110 Head CT was obtained and no acute intracranial abnormality. ASPECTS 10. Due to patient having hemoptysis pulmonology was consulted who agreed to intubate and bronch the patient at bedside before TPA decision was made. No LVO seen, not an IR candidate.  No previous history of stroke found during chart review. 04/14/2017 patient had a f/u visit for peripheral angiography and at that time he was on Eliquis and Plavix.   Date last known well: Date: 11/18/2017 Time last known well: Time: 08:00 tPA Given: Yes Modified Rankin: Rankin Score=0      SUBJECTIVE (INTERVAL HISTORY)  His girlfriend, brother and sister-in-law are at bedside.Plan to do the lymph node vs. Thyroid biopsy and try to wean off vent. Potential trach in the future if needed.     OBJECTIVE Temp:  [98.7 F (37.1 C)-100.5 F (38.1 C)] 100.1 F (37.8 C) (07/06 0400) Pulse Rate:  [85-110] 91 (07/06 0600) Cardiac Rhythm: Atrial fibrillation (07/05 2000) Resp:  [19-30] 26 (07/06 0600) BP: (89-129)/(63-93) 116/76 (07/06 0600) SpO2:  [93 %-100 %] 95 % (07/06 0724) FiO2 (%):  [40 %] 40 % (07/06 0724) Weight:  [352 lb 1.2 oz (159.7 kg)] 352 lb 1.2 oz (159.7 kg) (07/06 0500)  CBC:   Recent Labs  Lab 11/25/17 0403 11/26/17 0631  WBC 10.0 8.2  NEUTROABS 7.6 5.8  HGB 9.9* 9.6*  HCT 32.6* 31.1*  MCV 93.1 90.4  PLT 226 381    Basic Metabolic Panel:  Recent Labs  Lab 11/21/17 0500  11/22/17 0116  11/26/17 0631 11/27/17 0500  NA  --    < > 140   < > 137 136  K  --    < > 3.4*   < > 4.0 3.9  CL  --    < > 111   < > 104 100  CO2  --    < > 23   < > 27 28  GLUCOSE  --    < > 198*   < > 236* 278*  BUN  --    < > 16   < > 17 19  CREATININE  --    < > 0.61   < > 0.55* 0.67  CALCIUM  --    < > 8.2*   < > 9.1 9.4  MG 2.2  --  2.1  --   --   --   PHOS 2.8  --  2.9  --   --   --    < > = values in this interval not displayed.    Lipid Panel:     Component Value Date/Time   CHOL 110  11/19/2017 0437   CHOL 119 08/09/2017 1000   TRIG 340 (H) 11/19/2017 0437   HDL 19 (L) 11/19/2017 0437   HDL 29 (L) 08/09/2017 1000   CHOLHDL 5.8 11/19/2017 0437   VLDL 68 (H) 11/19/2017 0437   LDLCALC 23 11/19/2017 0437   LDLCALC 30 08/09/2017 1000   HgbA1c:  Lab Results  Component Value Date   HGBA1C 9.1 (H) 11/19/2017   Urine Drug Screen: No results found for: LABOPIA, COCAINSCRNUR, LABBENZ, AMPHETMU, THCU, LABBARB  Alcohol Level No results found for: [place] I have personally reviewed the radiological images below and agree with the radiology interpretations.  Ct Angio Head W Or Wo Contrast  Ct Angio Neck W Or Wo Contrast 11/18/2017 IMPRESSION:  Since the study of earlier today, there are only 2 changes. There is new consolidation and volume loss in the right upper lobe. 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 Flow is seen distal to that however, presumably indicating patent posterior communicating arteries.   Ct Angio Head W Or Wo Contrast Ct Angio Neck W Or Wo Contrast 11/18/2017 IMPRESSION:  1. Severe intracranial and extracranial atherosclerotic disease. No emergent large vessel occlusion  2. Severe stenosis right carotid bifurcation due to  calcified plaque, estimated 90% diameter stenosis. No significant left carotid stenosis. Severe stenosis cavernous carotid bilaterally.  3. Both vertebral arteries are patent in the neck with severe calcific stenosis V4 segment bilaterally.  4. 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 Small calcified lymph nodes left neck suspicious for metastatic disease  5. Small lung nodules on the right, possible metastatic disease. Recommend chest CT for further evaluation.   Ct Head Wo Contrast 11/19/2017 IMPRESSION:  1. Expected evolution of brainstem infarct without hemorrhage.  2. No new infarcts.  3. Left vertebral artery and basilar artery stent.  4. Acute on chronic sinus disease may be related to intubation.   Ct Chest W Contrast 11/18/2017 IMPRESSION Right upper lobe airspace opacity is noted with air bronchograms concerning for pneumonia or possibly atelectasis. Stable mediastinal adenopathy is noted compared to prior exam. Endotracheal and nasogastric tubes are in grossly good position. Coronary artery calcifications are noted suggesting coronary artery disease. Possible 2.8 cm left thyroid nodule. Thyroid ultrasound is recommended for further evaluation. Aortic Atherosclerosis (ICD10-I70.0).   Mr Brain Wo Contrast 11/19/2017 IMPRESSION:  1. 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 No associated hemorrhage or significant mass effect.  2. Additional punctate acute/early subacute infarctions within left lateral cerebellum and right parietal cortex.  3. Minimal chronic microvascular ischemic changes of the brain and mild parenchymal volume loss.   Ct Head Code Stroke Wo Contrast 11/18/2017 IMPRESSION:  1. No acute intracranial abnormality.  Atherosclerotic calcification  2. ASPECTS is 10   Cerebral Angiogram / Stent  11/18/2017 1. Tandem severe stenosis of distal basilar artery and  of the dominant Lt VBJ just prox to the basilar artery. Occluded non dominant RT VBJ. 2. S/P endovascular revascularization of the distal basilar artery  To 90 % patency , and of 30 to 40 % of the LT VBJ with stent placement. Dominant  Lt Pcom.  Transthoracic Echocardiogram - Left ventricle: The cavity size was moderately dilated. Wall   thickness was increased in a pattern of severe LVH. Systolic   function was normal. The estimated ejection fraction was in the   range of 55% to 60%. Wall motion was  normal; there were no   regional wall motion abnormalities. The study is not technically   sufficient to allow evaluation of LV diastolic function. - Aortic valve: Trileaflet. Sclerosis without stenosis. There was   no regurgitation. Valve area (Vmax): 2.88 cm^2. - Aorta: Aortic root ML diameter: 41.95 mm (ED). - Aortic root: The aortic root is mildly dilated. - Mitral valve: Calcified annulus. There was trivial regurgitation. - Left atrium: The atrium was mildly dilated. - Right ventricle: The cavity size was mildly dilated. Mildly   reduced systolic function. - Inferior vena cava: The vessel was normal in size. The   respirophasic diameter changes were in the normal range (>= 50%),   consistent with normal central venous pressure. Impressions: - Compared to a prior study in 02/2017, there have been no   significant changes.  Ct Head Wo Contrast 11/24/2017 CLINICAL DATA:  Follow-up examination for stroke. EXAM: CT HEAD WITHOUT CONTRAST TECHNIQUE: Contiguous axial images were obtained from the base of the skull through the vertex without intravenous contrast. COMPARISON:  Prior CT from 11/19/2017 as well as earlier studies. FINDINGS: Brain: Normal expected interval evolution of previously identified pontine infarct. No evidence for hemorrhagic transformation or other complication. Adjacent basilar cisterns and fourth ventricle remain patent. No acute intracranial hemorrhage. No new large vessel  territory infarct. No extra-axial fluid collection. Ventricles stable in size without hydrocephalus. Vascular: Vertebrobasilar stent in place, stable. Calcified atherosclerotic change at the skull base again noted. No new hyperdense vessel. Skull: Scalp soft tissues and calvarium within normal limits. Sinuses/Orbits: Globes and orbital soft tissues normal. Moderate mucosal thickening throughout the paranasal sinuses. Fluid within the partially visualized nasopharynx. Bilateral mastoid effusions. Patient likely intubated. Other: None.  IMPRESSION:  1. Continued normal expected interval evolution of brainstem infarct. No evidence for hemorrhagic transformation or other complication.  2. No other new intracranial abnormality.  3. Moderate paranasal sinus disease with bilateral mastoid effusions, likely related intubation.     Dg Chest Port 1 View 11/25/2017 CLINICAL DATA:  Intubation EXAM: PORTABLE CHEST 1 VIEW COMPARISON:  11/23/2017 FINDINGS: Endotracheal tube and nasogastric catheter are noted in satisfactory position. Right-sided PICC line is again seen in satisfactory position. Patient is significantly rotated to the right accentuating the mediastinal markings. Mild vascular congestion is noted without focal confluent infiltrate.  IMPRESSION:  Mild vascular congestion. Tubes and lines as described.    Ct Soft Tissue Neck W Contrast  Result Date: 11/26/2017 CLINICAL DATA:  Palpable neck nodule versus thyroid enlargement. Pre biopsy evaluation. EXAM: CT NECK WITH CONTRAST TECHNIQUE: Multidetector CT imaging of the neck was performed using the standard protocol following the bolus administration of intravenous contrast. CONTRAST:  131mL OMNIPAQUE IOHEXOL 300 MG/ML  SOLN COMPARISON:  CT angiography 11/18/2017 FINDINGS: Pharynx and larynx: Endotracheal tube and orogastric tube in place. No mucosal lesion identified. Salivary glands: Parotid and submandibular glands are normal. Thyroid: As shown  previously, there is a mass arising from the inferior thyroid on the left measuring approximately 5 x 3.4 x 5.5 cm with irregular margins and calcifications. There appears to be invasion of the superior mediastinum. Tumor partially encases the trachea and has a broad surface along the esophagus. Lymph nodes: Small calcified lower cervical lymph nodes are present. Supraclavicular nodes and superior mediastinal nodes are present as well, worrisome for extensive regional metastatic disease. Vascular: Carotid and vertebrobasilar atherosclerosis as seen previously. Recent placement of a basilar stent. Limited intracranial: No discernible acute intracranial finding. Visualized orbits: Negative Mastoids and visualized paranasal sinuses: Mucosal  inflammatory changes of the paranasal sinuses. Skeleton: Ordinary cervical spondylosis. Upper chest: Bilateral layering pleural effusions with dependent atelectasis. Lung detail is limited. Other: None IMPRESSION:  Aside from the fact that the patient shows endotracheal intubation and the presence of an orogastric tube, and aside from development of bilateral layering pleural effusions with dependent atelectasis, the findings are unchanged since the previous studies. Irregular mass arising from the inferior left thyroid lobe, measuring approximately 5 x 3.4 x 5.5 cm, with invasion of the surrounding tissues worrisome for thyroid carcinoma. Partial encasement of the trachea. Broad surface along the esophagus. Small but likely pathologic nodes in the lower neck and superior mediastinum, some with calcification, probably involved by metastatic disease.    PHYSICAL EXAM  Temp:  [98.7 F (37.1 C)-100.5 F (38.1 C)] 100.1 F (37.8 C) (07/06 0400) Pulse Rate:  [85-110] 91 (07/06 0600) Resp:  [19-30] 26 (07/06 0600) BP: (89-129)/(63-93) 116/76 (07/06 0600) SpO2:  [93 %-100 %] 95 % (07/06 0724) FiO2 (%):  [40 %] 40 % (07/06 0724) Weight:  [352 lb 1.2 oz (159.7 kg)] 352 lb 1.2  oz (159.7 kg) (07/06 0500)  General - morbid obesity, well developed, intubated on sedation.  Ophthalmologic - fundi not visualized due to noncooperation.  Cardiovascular - irregularly irregular heart rate and rhythm, afib RVR.  Neuro - intubated on sedation, in respiratory distress, fighting with vent, not following simple commands. Eyes open but not blinking to visual threat, pinpoint pupils, not reactive to light, eye disconjugate with right eye inward position, right eye mid position, sluggish dolls eyes and no corneal but positive gag. Not blinking to visual threat bilaterally. Facial symmetry not able to test due to ET tube, not able to move tongue or mouth on command. No movement on all extremities, DTR diminished and no babinski. Sensation, coordination and gait not tested.    ASSESSMENT/PLAN [name] is a 62 y.o. male with PMH of DM, hemoptysis, OSA, HTN, HLD, CAD, afib off Eliquis due to hemoptysis presenting with slurred speech, right sided weakness. Was intubated for hemoptysis prevention while giving tPA. Symptoms worsened and imaging confirmed BA occlusion. S/p IR with revascularization of BA as well as left VA s/p stenting.  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   MRI head - large acute/early subacute infarction within the central pons involving both sides, greater on the right.  CTA H&N - severe vascular athero including BA stenosis, b/l VA stenosis, right ICA proximal 90% stenosis, b/l cavernous ICA stenosis R>L.  Repeat CTA H/N - new distal BA occlusion  DSA -  revascularization of the distal basilar artery to 90 % patency , and of 30 to 40 % of the LT VBJ with stent placement.  CT head 11/24/17 showed normal evolution of brainstem infarct, no acute change  2D Echo - EF 55-60%  LDL - 23  HgbA1c - 9.1  VTE prophylaxis - heparin subq  Eliquis (recently stopped) and plavix prior to admission, now on aspirin 81 mg daily and Brilinta . Hold off AC for now due to hemoptysis and pending biopsy.   Ongoing aggressive stroke risk factor management  Therapy  recommendations:  pending  Disposition:  Pending  Thyroid mass  CTA neck - right large thyroid mass compressing on trachea.   Thyroid mass larger than CT chest in 08/2016  Evidence of tracheal erosion on bronch at this admission  CT neck with contrast showed thyroid mass concerning for carcinoma, encasement of trachea.   Pending thyroid vs. Lymph node biopsy with IR next week  Based on thyroid biopsy, will consider oncology consult  Respiratory failure  CCM on board  Current intubated on sedation  no cuff leak  Fighting with vent when off sedation  Likely need trach if not able to wean off vent  Do not think brilinta is a contraindication for trach  PAF  Currently in afib RVR with respiratory distress  On eliquis in the past but off due to hemoptysis  Hold off at this time due to hemoptysis and upcoming biopsy  On ASA and brilinta  Coreg bid for rate control  Cerebral vascular athero  CTA head and neck showed right ICA proximal 90% stenosis, left ICA proximal athero, b/l cavernous ICA severe athro R>L, BA severe stenosis, b/l V4 severe athero with right V4 occlusion  S/p left V4 stent and BA revascularization  On ASA and brilinta  Hemoptysis, improved  Since 08/2016 as per family  Not sure about etiology  TB ruled out in the past  Possible tracheal erosion from right thyroid mass   CT chest showed scattered long nodules, RUE suspected PNA  CT neck showed  thyroid mass concerning for carcinoma, encasement of trachea.   Plan for thyroid vs. Lymph node biopsy next week  CCM on board  Fever   Tmax 102.4->102.5->100.0->98.3->100.8  CXR - progression of bibasilar consolidation -> Mild vascular congestion is noted without focal confluent infiltrate  Off antibiotics  Hypertension  Stable on the low side  Now on coreg bid  Also on HCTZ for limb swelling . Long term BP goal normotensive.  Hyperlipidemia  Lipid lowering medication PTA: Crestor 10 mg daily  LDL 23, goal < 70  On Crestor  Continue statin at discharge  Diabetes  HgbA1c 9.1, goal < 7.0  Uncontrolled  Off insulin drip, CBG fluctuate  On lantus and SSI   CBG monitoring  Other Stroke Risk Factors  Advanced age  Former cigarette smoker - quit, 40 pack year history  Morbid obesity, Body mass index is 50.52 kg/m.    Hx stroke/TIA  Coronary artery disease   PAD with claudication  OSA on CPAP  Other Active Problems  Leukocytosis WBC 13.1->9.6->10.7->8.2->8.1->10.0->8.2      To contact Stroke Continuity provider, please refer to [email or web address] After hours, contact General Neurology

## 2017-11-27 NOTE — Progress Notes (Signed)
PULMONARY / CRITICAL CARE MEDICINE   Name: [name] [name] MRN: [id number] DOB: 1955/09/27    ADMISSION DATE:  11/18/2017 CONSULTATION DATE: 11/18/2017  REFERRING MD: Emergency department  CHIEF COMPLAINT: Stroke  HISTORY OF PRESENT ILLNESS:    [name] [name]  is a 62 year old male with a plethora of health issues who presented to [facility] on 11/18/2017 after contacting 911 when he noted slurred speech and right facial.  He was transported to [facility] emergency department code stroke was initiated.  He had been on Eliquis in the past for paroxysmal atrial fibrillation have been stopped 3 weeks prior due to hemoptysis. 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  There was concern that TPA could not be given due to recent hemoptysis and erosion of the thyroid nodule into the airway. He was urgently intubated by Dr. Nelda Marseille and erosion site was covered with the balloon cuff.  Patient was then given TPA and will be transferred to the neuro stroke unit. He will most likely remain intubated for period of 24 to 48 hours.  He will need further evaluation for most likely cancerous process in the neck and the lung.  7/6 This is post CVA day 9.  He is no longer being actively cooled.  He continues to be sedated on a combination of fentanyl and Precedex.  We have begun decreasing his Precedex this morning and we have not lost ventilator synchrony however he is not yet interactive.  He remains intubated and mechanically ventilated.     PAST MEDICAL HISTORY :  He  has a past medical history of Collagen vascular disease ([place]), Coronary artery disease, GERD (gastroesophageal reflux disease), High cholesterol, Hypertension, OSA (obstructive sleep apnea), Pneumonia (2011), and Type II diabetes mellitus ([place]) (07/2016).  PAST SURGICAL HISTORY: He  has a past surgical history that includes Video bronchoscopy  (Bilateral, 08/25/2016); Lower Extremity Angiography (Bilateral, 04/01/2017); PERIPHERAL VASCULAR INTERVENTION (Left, 04/01/2017); Coronary angioplasty with stent (2011; 07/2016); Radiology with anesthesia (N/A, 11/18/2017); IR ANGIO EXTRACRAN SEL COM CAROTID INNOMINATE UNI BILAT MOD SED (11/18/2017); IR ANGIO VERTEBRAL SEL SUBCLAVIAN INNOMINATE UNI R MOD SED (11/18/2017); and IR Intra Cran Stent (11/18/2017).  No Known Allergies  No current facility-administered medications on file prior to encounter.    Current Outpatient Medications on File Prior to Encounter  Medication Sig  . apixaban (ELIQUIS) 5 MG TABS tablet Take 1 tablet (5 mg total) 2 (two) times daily by mouth.  . dronedarone (MULTAQ) 400 MG tablet Take 1 tablet (400 mg total) 2 (two) times daily with a meal by mouth.  . irbesartan (AVAPRO) 75 MG tablet TAKE ONE TABLET BY MOUTH ONE TIME DAILY   . liraglutide (VICTOZA) 18 MG/3ML SOPN Inject 0.2 mLs (1.2 mg total) into the skin daily at 10 pm. Start with 0.6 mg for one week, then increase to 1.2 mg daily.  . metoprolol tartrate (LOPRESSOR) 50 MG tablet Take 1 tablet (50 mg total) by mouth 2 (two) times daily.  . Omega-3 Fatty Acids (FISH OIL) 1000 MG CAPS Take 2 capsules (2,000 mg total) by mouth 2 (two) times daily.  . pantoprazole (PROTONIX) 40 MG tablet TAKE 1 TABLET BY MOUTH ONCE A DAY 30 TO 60 MINUTES BEFORE FIRST MEAL OF THE DAY   . B Complex Vitamins (VITAMIN B COMPLEX PO) Take 1 capsule at bedtime by mouth.   . clopidogrel (PLAVIX) 75 MG tablet Take 1 tablet (75 mg total)  by mouth daily. (Patient not taking: Reported on 11/20/2017)  . famotidine (PEPCID) 20 MG tablet One at bedtime  . fenofibrate 160 MG tablet Take 1 tablet (160 mg total) by mouth daily.  . metFORMIN (GLUCOPHAGE) 1000 MG tablet Take 1,000 mg by mouth 2 (two) times daily with a meal.  . nitroGLYCERIN (NITROSTAT) 0.4 MG SL tablet Place 1 tablet (0.4 mg total) under the tongue every 5 (five) minutes as needed for chest pain.   . rosuvastatin (CRESTOR) 10 MG tablet Take 5 mg by mouth at bedtime.    FAMILY HISTORY:  His indicated that his mother is alive. He indicated that his father is deceased. He indicated that his sister is deceased. He indicated that his brother is alive.   SOCIAL HISTORY: He  reports that he quit smoking about 5 years ago. His smoking use included cigarettes. He has a 40.00 pack-year smoking history. He has never used smokeless tobacco. He reports that he drinks alcohol. He reports that he does not use drugs.  REVIEW OF SYSTEMS:   Not available  SUBJECTIVE:  Intubated stable mechanical ventilatory support  VITAL SIGNS: BP 116/76   Pulse 91   Temp 100.1 F (37.8 C) (Axillary)   Resp (!) 26   Ht 5\' 10"  (1.778 m)   Wt (!) 352 lb 1.2 oz (159.7 kg)   SpO2 95%   BMI 50.52 kg/m   HEMODYNAMICS:    VENTILATOR SETTINGS: Vent Mode: PRVC FiO2 (%):  [40 %] 40 % Set Rate:  [26 bmp] 26 bmp Vt Set:  [580 mL] 580 mL PEEP:  [8 cmH20] 8 cmH20 Plateau Pressure:  [22 cmH20-26 cmH20] 25 cmH20  INTAKE / OUTPUT: I/O last 3 completed shifts: In: 4241.7 [I.V.:1748.3; NG/GT:2393.4; IV Piggyback:100] Out: 4900 [Urine:4900]  PHYSICAL EXAMINATION: General: Obese middle-aged male who is orally intubated mechanically ventilated and in no distress.         Neuro: Exam is on Precedex and fentanyl.  Partial eye opening to noxious stimuli but not nodding to questions or following any instructions.  No limb movement in response to noxious stimuli.  Pupils are equal, EOMs appear to be full     HEENT: Has a remarkably short thick neck with a very short supplemental distance.  I cannot appreciate any adenopathy.     Cardiovascular: S1 and S2 are distant and irregularly irregular without murmur rub or gallop.  He has trace to 1+ anasarca.  Feet are now symmetrically warm.     Lungs: Respirations are unlabored, there is symmetric air movement, few scattered rhonchi and no wheezing.  He is just breathing above  the set ventilator rate.     Abdomen: Abdomen is obese soft and nontender.  There are no masses organomegaly.    Recent Labs  Lab 11/25/17 0403 11/26/17 0631 11/27/17 0500  NA 141 137 136  K 3.8 4.0 3.9  CL 108 104 100  CO2 25 27 28   BUN 19 17 19   CREATININE 0.52* 0.55* 0.67  GLUCOSE 217* 236* 278*    Electrolytes Recent Labs  Lab 11/20/17 1633 11/21/17 0500  11/22/17 0116  11/25/17 0403 11/26/17 0631 11/27/17 0500  CALCIUM  --   --    < > 8.2*   < > 9.3 9.1 9.4  MG 2.2 2.2  --  2.1  --   --   --   --   PHOS 3.0 2.8  --  2.9  --   --   --   --    < > =  values in this interval not displayed.    CBC Recent Labs  Lab 11/24/17 0517 11/25/17 0403 11/26/17 0631  WBC 8.1 10.0 8.2  HGB 8.7* 9.9* 9.6*  HCT 28.9* 32.6* 31.1*  PLT 197 226 234    Coag's Recent Labs  Lab 11/21/17 2311 11/24/17 0241  APTT 34  --   INR 1.12 1.20    Sepsis Markers Recent Labs  Lab 11/23/17 0954 11/24/17 0241 11/25/17 0403  PROCALCITON 0.12 0.16 0.13    ABG Recent Labs  Lab 11/25/17 0350 11/26/17 0350 11/27/17 0400  PHART 7.443 7.449 7.458*  PCO2ART 35.2 35.7 37.0  PO2ART 85.1 89.6 78.0*    Liver Enzymes Recent Labs  Lab 11/25/17 0403  AST 14*  ALT 15  ALKPHOS 43  BILITOT 0.6  ALBUMIN 2.0*    Cardiac Enzymes No results for input(s): TROPONINI, PROBNP in the last 168 hours.  Glucose Recent Labs  Lab 11/26/17 1254 11/26/17 1548 11/26/17 2005 11/26/17 2319 11/27/17 0401 11/27/17 0740  GLUCAP 239* 218* 234* 208* 272* 261*    Imaging Ct Soft Tissue Neck W Contrast  Result Date: 11/26/2017 CLINICAL DATA:  Palpable neck nodule versus thyroid enlargement. Pre biopsy evaluation. EXAM: CT NECK WITH CONTRAST TECHNIQUE: Multidetector CT imaging of the neck was performed using the standard protocol following the bolus administration of intravenous contrast. CONTRAST:  163mL OMNIPAQUE IOHEXOL 300 MG/ML  SOLN COMPARISON:  CT angiography 11/18/2017 FINDINGS: Pharynx  and larynx: Endotracheal tube and orogastric tube in place. No mucosal lesion identified. Salivary glands: Parotid and submandibular glands are normal. Thyroid: As shown previously, there is a mass arising from the inferior thyroid on the left measuring approximately 5 x 3.4 x 5.5 cm with irregular margins and calcifications. There appears to be invasion of the superior mediastinum. Tumor partially encases the trachea and has a broad surface along the esophagus. Lymph nodes: Small calcified lower cervical lymph nodes are present. Supraclavicular nodes and superior mediastinal nodes are present as well, worrisome for extensive regional metastatic disease. Vascular: Carotid and vertebrobasilar atherosclerosis as seen previously. Recent placement of a basilar stent. Limited intracranial: No discernible acute intracranial finding. Visualized orbits: Negative Mastoids and visualized paranasal sinuses: Mucosal inflammatory changes of the paranasal sinuses. Skeleton: Ordinary cervical spondylosis. Upper chest: Bilateral layering pleural effusions with dependent atelectasis. Lung detail is limited. Other: None IMPRESSION: Aside from the fact that the patient shows endotracheal intubation and the presence of an orogastric tube, and aside from development of bilateral layering pleural effusions with dependent atelectasis, the findings are unchanged since the previous studies. Irregular mass arising from the inferior left thyroid lobe, measuring approximately 5 x 3.4 x 5.5 cm, with invasion of the surrounding tissues worrisome for thyroid carcinoma. Partial encasement of the trachea. Broad surface along the esophagus. Small but likely pathologic nodes in the lower neck and superior mediastinum, some with calcification, probably involved by metastatic disease. Electronically Signed   By: Nelson Chimes M.D.   On: 11/26/2017 11:35   Dg Chest Port 1 View  Result Date: 11/27/2017 CLINICAL DATA:  Hypoxia EXAM: PORTABLE CHEST 1  VIEW COMPARISON:  November 25, 2017 FINDINGS: Endotracheal tube tip is 7.1 cm above the carina. Nasogastric tube tip and side port are below the diaphragm. Central catheter tip is in the superior vena cava. No pneumothorax. There is slight interstitial pulmonary edema. No consolidation. There are small pleural effusions bilaterally. There is cardiomegaly with pulmonary venous hypertension. IMPRESSION: Tube and catheter positions as described without pneumothorax. There is pulmonary vascular  congestion with slight interstitial edema and small pleural effusions bilaterally. No frank consolidation. Cardiac silhouette stable. Electronically Signed   By: Lowella Grip III M.D.   On: 11/27/2017 07:17        CULTURES: 11/18/2017 sputum>>  ANTIBIOTICS: Coverage for aspiration D/C'd 7/6  SIGNIFICANT EVENTS: 11/18/2017 intubated  LINES/TUBES: 11/18/2017 endotracheal tube>>  DISCUSSION: [name] [name]  is a 62 year old male with a plethora of health issues who presented to [facility] on 11/18/2017 after contacting 911 when he noted slurred speech and right facia droop.  He was transported to [facility] emergency department code stroke was initiated.  He had been on Eliquis in the past for paroxysmal atrial fibrillation have been stopped 3 weeks prior due to hemoptysis. 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  There was concern that TPA could not be given due to recent hemoptysis and erosion of the thyroid nodule into the airway. He was urgently intubated by Dr. Nelda Marseille and erosion site was covered with the balloon cuff.  Patient was then given TPA.  He subsequently had a stent placed in the vertebrobasilar system.  ASSESSMENT / PLAN:  PULMONARY A: Vent dependent respiratory failure.  Fortunately he continues to be free of hemoptysis.  I discontinued his empiric cefotetan today as he does not have a white  count and he has no infiltrate on chest x-ray.  The x-ray does suggest fluid overload and he is developing anasarca and I have put him on a more aggressive dose of diuretic.  Yesterday Klonopin was added to his sedative regimen and I am going to start tapering him off of the Precedex and see if he will maintain ventilator synchrony.  In order to promote ventilator synchrony I will contemplate switching to pressure control ventilation.   Chronic obstructive pulmonary disease Former smoker Multiple lung nodules suspicious for cancer    CARDIOVASCULAR A:  History of  Paroxysmal atrial fibrillation, currently off anticoagulation for recent hemoptysis of 3 weeks duration We have reasonable rate control with Coreg.       Coronary artery disease.  He is currently on dual antiplatelet therapy and I have increased his dose of statin to an appropriate dose for secondary prevention following his CVA. Peripheral vascular disease   RENAL  A:   No acute renal issues P:   Monitor electrolytes   Thyroid Mass A repeat CT scan of the neck was obtained today to determine whether or not there is a lymph node that could be biopsied without compromising his airway in the event that there is bleeding.  We are awaiting a decision by interventional radiology on Monday 7/8  GASTROINTESTINAL A:   History of GERD P:   Proton pump inhibitor Gastric tube  Will start OGT feeds  HEMATOLOGIC A:   Chronic anticoagulation for atrial fibrillation Off anticoagulation for 3 weeks due to her hemoptysis Fortunately we had no further hemoptysis overnight.  He continues on dual antiplatelet therapy for his newly placed vertebral and basilar artery stent  INFECTIOUS A:   Observed aspiration P:   We have no infectious source for his recent fevers.  He likely has bland aspiration with blood. His white count has not been elevated, he has not had a left shift and his procalcitonin is unimpressive.  We have no positive  cultures.   Cefotetan was discontinued on 7/6  ENDOCRINE CBG (last 3)  Recent Labs    11/26/17  2319 11/27/17 0401 11/27/17 0740  GLUCAP 208* 272* 261*    A:   Diabetes mellitus P:   Ischemic control is still suboptimal, I have increased his dose of Lantus.  PAD Patient had a cool right foot which resolved with removing his right femoral sheath.    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 This is day9 status post infarct.  He is no longer requiring targeted temperature management.  I am ensuring hemodynamic stability and getting better control of his hyperglycemia as adjunctive steps to optimizing neurologic outcome.  He continues on aspirin, Brilinta and a statin.   Lars Masson, MD Critical Care Medicine

## 2017-11-28 LAB — CBC WITH DIFFERENTIAL/PLATELET
ABS IMMATURE GRANULOCYTES: 0.1 10*3/uL (ref 0.0–0.1)
BASOS PCT: 1 %
Basophils Absolute: 0.1 10*3/uL (ref 0.0–0.1)
Eosinophils Absolute: 0.4 10*3/uL (ref 0.0–0.7)
Eosinophils Relative: 5 %
HCT: 31.7 % — ABNORMAL LOW (ref 39.0–52.0)
HEMOGLOBIN: 10.1 g/dL — AB (ref 13.0–17.0)
Immature Granulocytes: 1 %
Lymphocytes Relative: 13 %
Lymphs Abs: 1.1 10*3/uL (ref 0.7–4.0)
MCH: 28.1 pg (ref 26.0–34.0)
MCHC: 31.9 g/dL (ref 30.0–36.0)
MCV: 88.3 fL (ref 78.0–100.0)
MONO ABS: 0.6 10*3/uL (ref 0.1–1.0)
MONOS PCT: 7 %
NEUTROS ABS: 6.6 10*3/uL (ref 1.7–7.7)
Neutrophils Relative %: 73 %
PLATELETS: 252 10*3/uL (ref 150–400)
RBC: 3.59 MIL/uL — ABNORMAL LOW (ref 4.22–5.81)
RDW: 13.4 % (ref 11.5–15.5)
WBC: 8.8 10*3/uL (ref 4.0–10.5)

## 2017-11-28 LAB — BLOOD GAS, ARTERIAL
ACID-BASE EXCESS: 4.3 mmol/L — AB (ref 0.0–2.0)
BICARBONATE: 27.9 mmol/L (ref 20.0–28.0)
Drawn by: 313941
FIO2: 40
LHR: 22 {breaths}/min
O2 SAT: 93 %
PEEP: 8 cmH2O
Patient temperature: 99.2
VT: 580 mL
pCO2 arterial: 40 mmHg (ref 32.0–48.0)
pH, Arterial: 7.46 — ABNORMAL HIGH (ref 7.350–7.450)
pO2, Arterial: 67.1 mmHg — ABNORMAL LOW (ref 83.0–108.0)

## 2017-11-28 LAB — GLUCOSE, CAPILLARY
Glucose-Capillary: 223 mg/dL — ABNORMAL HIGH (ref 70–99)
Glucose-Capillary: 228 mg/dL — ABNORMAL HIGH (ref 70–99)
Glucose-Capillary: 256 mg/dL — ABNORMAL HIGH (ref 70–99)
Glucose-Capillary: 256 mg/dL — ABNORMAL HIGH (ref 70–99)
Glucose-Capillary: 267 mg/dL — ABNORMAL HIGH (ref 70–99)
Glucose-Capillary: 283 mg/dL — ABNORMAL HIGH (ref 70–99)

## 2017-11-28 LAB — BASIC METABOLIC PANEL
Anion gap: 7 (ref 5–15)
BUN: 19 mg/dL (ref 8–23)
CHLORIDE: 99 mmol/L (ref 98–111)
CO2: 29 mmol/L (ref 22–32)
Calcium: 9.3 mg/dL (ref 8.9–10.3)
Creatinine, Ser: 0.57 mg/dL — ABNORMAL LOW (ref 0.61–1.24)
GFR calc Af Amer: >60 mL/min (ref >60)
GFR calc non Af Amer: >60 mL/min (ref >60)
GLUCOSE: 281 mg/dL — AB (ref 70–99)
POTASSIUM: 3.8 mmol/L (ref 3.5–5.1)
Sodium: 135 mmol/L (ref 135–145)

## 2017-11-28 LAB — TSH: TSH: 2.674 u[IU]/mL (ref 0.350–4.500)

## 2017-11-28 LAB — T4, FREE: FREE T4: 0.95 ng/dL (ref 0.82–1.77)

## 2017-11-28 LAB — MAGNESIUM: Magnesium: 1.8 mg/dL (ref 1.7–2.4)

## 2017-11-28 MED ORDER — CLONAZEPAM 0.5 MG PO TBDP
1.0000 mg | ORAL_TABLET | Freq: Three times a day (TID) | ORAL | Status: DC
  Administered 2017-11-28 – 2017-12-06 (×25): 1 mg via ORAL
  Filled 2017-11-28 (×25): qty 2

## 2017-11-28 MED ORDER — VITAL HIGH PROTEIN PO LIQD
1000.0000 mL | ORAL | Status: DC
  Administered 2017-11-28: 1000 mL
  Filled 2017-11-28: qty 1000

## 2017-11-28 MED ORDER — ALTEPLASE 2 MG IJ SOLR
2.0000 mg | Freq: Once | INTRAMUSCULAR | Status: AC
  Administered 2017-11-28: 2 mg

## 2017-11-28 MED ORDER — INSULIN GLARGINE 100 UNIT/ML ~~LOC~~ SOLN
45.0000 [IU] | Freq: Every day | SUBCUTANEOUS | Status: DC
  Administered 2017-11-28 – 2017-12-04 (×7): 45 [IU] via SUBCUTANEOUS
  Filled 2017-11-28 (×7): qty 0.45

## 2017-11-28 MED ORDER — DEXMEDETOMIDINE HCL IN NACL 400 MCG/100ML IV SOLN
0.0000 ug/kg/h | INTRAVENOUS | Status: AC
  Administered 2017-11-28 – 2017-11-30 (×13): 1 ug/kg/h via INTRAVENOUS
  Administered 2017-11-30: 0.5 ug/kg/h via INTRAVENOUS
  Administered 2017-11-30: 1 ug/kg/h via INTRAVENOUS
  Administered 2017-11-30 – 2017-12-01 (×4): 0.5 ug/kg/h via INTRAVENOUS
  Administered 2017-12-01: 0.8 ug/kg/h via INTRAVENOUS
  Filled 2017-11-28: qty 200
  Filled 2017-11-28 (×16): qty 100
  Filled 2017-11-28: qty 200
  Filled 2017-11-28: qty 100
  Filled 2017-11-28: qty 200

## 2017-11-28 NOTE — Progress Notes (Signed)
PULMONARY / CRITICAL CARE MEDICINE   Name: [name] [name] MRN: [id number] DOB: Apr 25, 1956    ADMISSION DATE:  11/18/2017 CONSULTATION DATE: 11/18/2017  REFERRING MD: Emergency department  CHIEF COMPLAINT: Stroke  HISTORY OF PRESENT ILLNESS:    [name] [name]  is a 62 year old male with a plethora of health issues who presented to [facility] on 11/18/2017 after contacting 911 when he noted slurred speech and right facial.  He was transported to [facility] emergency department code stroke was initiated.  He had been on Eliquis in the past for paroxysmal atrial fibrillation have been stopped 3 weeks prior due to hemoptysis. 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  There was concern that TPA could not be given due to recent hemoptysis and erosion of the thyroid nodule into the airway. He was urgently intubated by Dr. Nelda Marseille and erosion site was covered with the balloon cuff.  Patient was then given TPA and will be transferred to the neuro stroke unit. He will most likely remain intubated for period of 24 to 48 hours.  He will need further evaluation for most likely cancerous process in the neck and the lung.  7/7 This is post CVA day 9.  He has spontaneous eye opening today but is not interactive.  He remains intubated and mechanically ventilated with a high minute volume.      PAST MEDICAL HISTORY :  He  has a past medical history of Collagen vascular disease ([name] [name]), Coronary artery disease, GERD (gastroesophageal reflux disease), High cholesterol, Hypertension, OSA (obstructive sleep apnea), Pneumonia (2011), and Type II diabetes mellitus ([name] [name]) (07/2016).  PAST SURGICAL HISTORY: He  has a past surgical history that includes Video bronchoscopy (Bilateral, 08/25/2016); Lower Extremity Angiography (Bilateral, 04/01/2017); PERIPHERAL VASCULAR INTERVENTION (Left, 04/01/2017); Coronary angioplasty with  stent (2011; 07/2016); Radiology with anesthesia (N/A, 11/18/2017); IR ANGIO EXTRACRAN SEL COM CAROTID INNOMINATE UNI BILAT MOD SED (11/18/2017); IR ANGIO VERTEBRAL SEL SUBCLAVIAN INNOMINATE UNI R MOD SED (11/18/2017); and IR Intra Cran Stent (11/18/2017).  No Known Allergies  No current facility-administered medications on file prior to encounter.    Current Outpatient Medications on File Prior to Encounter  Medication Sig  . apixaban (ELIQUIS) 5 MG TABS tablet Take 1 tablet (5 mg total) 2 (two) times daily by mouth.  . dronedarone (MULTAQ) 400 MG tablet Take 1 tablet (400 mg total) 2 (two) times daily with a meal by mouth.  . irbesartan (AVAPRO) 75 MG tablet TAKE ONE TABLET BY MOUTH ONE TIME DAILY   . liraglutide (VICTOZA) 18 MG/3ML SOPN Inject 0.2 mLs (1.2 mg total) into the skin daily at 10 pm. Start with 0.6 mg for one week, then increase to 1.2 mg daily.  . metoprolol tartrate (LOPRESSOR) 50 MG tablet Take 1 tablet (50 mg total) by mouth 2 (two) times daily.  . Omega-3 Fatty Acids (FISH OIL) 1000 MG CAPS Take 2 capsules (2,000 mg total) by mouth 2 (two) times daily.  . pantoprazole (PROTONIX) 40 MG tablet TAKE 1 TABLET BY MOUTH ONCE A DAY 30 TO 60 MINUTES BEFORE FIRST MEAL OF THE DAY   . B Complex Vitamins (VITAMIN B COMPLEX PO) Take 1 capsule at bedtime by mouth.   . clopidogrel (PLAVIX) 75 MG tablet Take 1 tablet (75 mg total) by mouth daily. (Patient not taking: Reported on 11/20/2017)  . famotidine (PEPCID) 20 MG tablet One at bedtime  . fenofibrate 160 MG tablet Take  1 tablet (160 mg total) by mouth daily.  . metFORMIN (GLUCOPHAGE) 1000 MG tablet Take 1,000 mg by mouth 2 (two) times daily with a meal.  . nitroGLYCERIN (NITROSTAT) 0.4 MG SL tablet Place 1 tablet (0.4 mg total) under the tongue every 5 (five) minutes as needed for chest pain.  . rosuvastatin (CRESTOR) 10 MG tablet Take 5 mg by mouth at bedtime.    FAMILY HISTORY:  His indicated that his mother is alive. He indicated that  his father is deceased. He indicated that his sister is deceased. He indicated that his brother is alive.   SOCIAL HISTORY: He  reports that he quit smoking about 5 years ago. His smoking use included cigarettes. He has a 40.00 pack-year smoking history. He has never used smokeless tobacco. He reports that he drinks alcohol. He reports that he does not use drugs.  REVIEW OF SYSTEMS:   Not available  SUBJECTIVE:  Intubated stable mechanical ventilatory support  VITAL SIGNS: BP 120/74   Pulse 92   Temp 100.1 F (37.8 C) (Axillary)   Resp (!) 30   Ht _0  (1.778 m)   Wt (!) 348 lb 1.7 oz (157.9 kg)   SpO2 92%   BMI 49.95 kg/m   HEMODYNAMICS:    VENTILATOR SETTINGS: Vent Mode: PRVC FiO2 (%):  [40 %] 40 % Set Rate:  [22 bmp] 22 bmp Vt Set:  [580 mL] 580 mL PEEP:  [8 cmH20] 8 cmH20 Plateau Pressure:  [23 cmH20-25 cmH20] 24 cmH20  INTAKE / OUTPUT: I/O last 3 completed shifts: In: 3161.8 [I.V.:1661.8; NG/GT:1500] Out: 6300 [Urine:6300]  PHYSICAL EXAMINATION: General: This is an obese middle-aged male who is diaphoretic and somewhat labored this morning.         Neuro: Exam is on Precedex and fentanyl.  Spontaneous eye opening and intermittently seems to be tracking but does not respond to instructions.  He does not withdraw any limbs from pain.  Pupils are equal .    HEENT: Has a remarkably short thick neck with a very short supplemental distance.  I cannot appreciate any adenopathy.     Cardiovascular: S1 and S2 are distant and irregularly irregular without murmur rub or gallop.  He has trace anasarca.  Feet are symmetrically warm. Lungs: Respirations are somewhat labored, this is improved with increasing his extrinsic PEEP.  There is symmetric air movement and no wheezes.  He is clearly trapping by flow versus time.     Abdomen: Abdomen is obese soft and nontender.  There are no masses organomegaly.    Recent Labs  Lab 11/26/17 0631 11/27/17 0500 11/28/17 0611  NA 137  136 135  K 4.0 3.9 3.8  CL 104 100 99  CO2 _1 BUN _2 CREATININE 0.55* 0.67 0.57*  GLUCOSE 236* 278* 281*    Electrolytes Recent Labs  Lab 11/22/17 0116  11/26/17 0631 11/27/17 0500 11/28/17 0611  CALCIUM 8.2*   < > 9.1 9.4 9.3  MG 2.1  --   --   --  1.8  PHOS 2.9  --   --   --   --    < > = values in this interval not displayed.    CBC Recent Labs  Lab 11/25/17 0403 11/26/17 0631 11/28/17 0611  WBC 10.0 8.2 8.8  HGB 9.9* 9.6* 10.1*  HCT 32.6* 31.1* 31.7*  PLT 226 234 252    Coag's Recent Labs  Lab 11/21/17 2311 11/24/17 0241  APTT 34  --  INR 1.12 1.20    Sepsis Markers Recent Labs  Lab 11/23/17 0954 11/24/17 0241 11/25/17 0403  PROCALCITON 0.12 0.16 0.13    ABG Recent Labs  Lab 11/26/17 0350 11/27/17 0400 11/28/17 0425  PHART 7.449 7.458* 7.460*  PCO2ART 35.7 37.0 40.0  PO2ART 89.6 78.0* 67.1*    Liver Enzymes Recent Labs  Lab 11/25/17 0403  AST 14*  ALT 15  ALKPHOS 43  BILITOT 0.6  ALBUMIN 2.0*    Cardiac Enzymes No results for input(s): TROPONINI, PROBNP in the last 168 hours.  Glucose Recent Labs  Lab 11/27/17 1121 11/27/17 1503 11/27/17 2009 11/27/17 2316 11/28/17 0325 11/28/17 0750  GLUCAP 250* 236* 265* 251* 283* 267*    Imaging No results found.      CULTURES: 11/18/2017 sputum>>  ANTIBIOTICS: 11/18/2017 Unasyn>> SIGNIFICANT EVENTS: 11/18/2017 intubated  LINES/TUBES: 11/18/2017 endotracheal tube>>  DISCUSSION: Mr. Doyle  is a 62 year old male with a plethora of health issues who presented to [facility] on 11/18/2017 after contacting 911 when he noted slurred speech and right facial.  He was transported to [facility] emergency department code stroke was initiated.  He had been on Eliquis in the past for paroxysmal atrial fibrillation have been stopped 3 weeks prior due to hemoptysis. 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  There was concern that TPA could not be given due to recent hemoptysis and erosion of the thyroid nodule into the airway. He was urgently intubated by Dr. Nelda Marseille and erosion site was covered with the balloon cuff.  Patient was then given TPA and will be transferred to the neuro stroke unit. He will most likely remain intubated for period of 24 to 48 hours.  He will need further evaluation for most likely cancerous process in the neck and the lung.  ASSESSMENT / PLAN:  PULMONARY A: Vent dependent respiratory failure.  Continues to have a very high minute ventilation and wide AA gradient.  Reviewing the film the most likely explanation for his  wide AA gradient is fluid overload and I am continuing to diurese the patient.  I am however concerned that the fluid overload is not florid and that there may be another contribution to his wide AA gradient and high minute volume.  I have sent a TSH and free T4 to rule out thyrotoxicosis and have ordered Dopplers of the lower extremities to rule out DVTs of potential thromboembolic disease.  Supportively I have switched him to pressure control ventilation.  He is air trapping with his high minute ventilatory demands and I have increased his extrinsic PEEP hoping to improve synchrony. Chronic obstructive pulmonary disease Former smoker Multiple lung nodules suspicious for cancer    CARDIOVASCULAR A:  History of  Paroxysmal atrial fibrillation, currently off anticoagulation for recent hemoptysis of 3 weeks duration We have reasonable rate control with Coreg.       Coronary artery disease.  He is currently on dual antiplatelet therapy and I have increased his dose of statin to an appropriate dose for secondary prevention following his CVA. Peripheral vascular disease   RENAL  A:   No acute renal issues P:   Monitor electrolytes   Thyroid Mass We are awaiting input from interventional radiology to see if they  might be able to biopsy a node in his neck without having to go to some sort of open procedure or procedure that would potentially risk his airway  should they biopsy a deeper structure and subsequently have bleeding related to aspirin and Brilinta or other bridging therapy   GASTROINTESTINAL A:   History of GERD P:   Proton pump inhibitor Gastric tube  Will start OGT feeds  HEMATOLOGIC A:   Chronic anticoagulation for atrial fibrillation Off anticoagulation for 3 weeks due to her hemoptysis Fortunately we had no further hemoptysis overnight.  He continues on dual antiplatelet therapy for his newly placed vertebral and basilar artery stent  INFECTIOUS A:   Observed aspiration P:   We have no infectious source for his recent fevers.  He likely has bland aspiration with blood. His white count has not been elevated, he has not had a left shift and his procalcitonin is unimpressive.  We have no positive cultures.     ENDOCRINE CBG (last 3)  Recent Labs    11/27/17 2316 11/28/17 0325 11/28/17 0750  GLUCAP 251* 283* 267*    A:   Diabetes mellitus P:   IV sliding scale insulin.  Sliding scale insulin was adjusted today as was his long-acting insulin to get better glycemic control we are no longer on an IV infusion  PAD Patient had a cool right foot which resolved with removing his right femoral sheath.    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 This is day 9 status post infarct.  I think that we are far enough out that we do not need to be concerned any longer with targeted temperature management.  He continues on Brilinta and aspirin to maintain stent patency as well as a statin.    FAMILY   The treatment algorithm which I have discussed with family goes briefly as follows,       Her first priority is to maintain improved tactic brain function.  We are continuing aspirin and  Brilinta for now to ensure that his basilar artery stent remains patent.  They are aware that this is causing some difficulties in terms of our ability to biopsy or treat his neck mass.  Our second priority is to determine the histology of his neck mass.  Should approve that this is an anaplastic tumor or a very aggressive met from elsewhere it would substantially alter their perception of what an appropriate course of therapy might be.  They are aware that at some point he would require a major surgical intervention to resect his neck mass and provide a reliable airway, a procedure which likely cannot be performed at this hospital.  Hal Neer as to whether we should proceed in that direction are very much dependent however on the histology of the neck mass.  We are awaiting input from interventional radiology on 7/8 to determine whether they can obtain  a tissue sample for [place].  Lars Masson, MD Critical Care Medicine

## 2017-11-28 NOTE — Progress Notes (Signed)
STROKE TEAM PROGRESS NOTE   HISTORY [name] [name] is an 62 y.o. male with HTN, DM2, HLD, afib ( was on eliquis) hemoptysis who presented to [facility] ED as a code stroke with left side weakness and slurred speech.   Per patient and girlfriend he woke up this morning and he felt normal. He walked to the door at 0600 to wave bye bye to his girlfriend. About 830 he called his girlfriend and was having slurred speech. She told him to call EMS. Patient states that it was about 0800 when he started " feeling funny". Patient has a history of coughing up blood in which he states he was removed from his eliquis 3 weeks ago. He reports that his last episode of coughing up blood was a few days ago. Denies any CP, SOB   In the ed:BG: 381, BP: was  161/110 Head CT was obtained and no acute intracranial abnormality. ASPECTS 10. Due to patient having hemoptysis pulmonology was consulted who agreed to intubate and bronch the patient at bedside before TPA decision was made. No LVO seen, not an IR candidate.  No previous history of stroke found during chart review. 04/14/2017 patient had a f/u visit for peripheral angiography and at that time he was on Eliquis and Plavix.   Date last known well: Date: 11/18/2017 Time last known well: Time: 08:00 tPA Given: Yes Modified Rankin: Rankin Score=0      SUBJECTIVE (INTERVAL HISTORY)  His girlfriend,  and sister-in-law are at bedside.Plan to do the lymph node vs. Thyroid biopsy and try to wean off vent next week. Potential trach in the future if needed.     OBJECTIVE Temp:  [98.5 F (36.9 C)-100.1 F (37.8 C)] 99 F (37.2 C) (07/07 1200) Pulse Rate:  [56-111] 98 (07/07 1200) Cardiac Rhythm: Atrial fibrillation (07/07 1200) Resp:  [0-30] 24 (07/07 1200) BP: (107-134)/(66-85) 125/72 (07/07 1200) SpO2:  [86 %-97 %] 97 % (07/07 1200) FiO2 (%):  [40 %] 40 % (07/07 1144) Weight:  [348 lb 1.7 oz (157.9 kg)] 348 lb 1.7 oz (157.9 kg) (07/07 0500)  CBC:   Recent Labs  Lab 11/26/17 0631 11/28/17 0611  WBC 8.2 8.8  NEUTROABS 5.8 6.6  HGB 9.6* 10.1*  HCT 31.1* 31.7*  MCV 90.4 88.3  PLT 234 517    Basic Metabolic Panel:  Recent Labs  Lab 11/22/17 0116  11/27/17 0500 11/28/17 0611  NA 140   < > 136 135  K 3.4*   < > 3.9 3.8  CL 111   < > 100 99  CO2 23   < > 28 29  GLUCOSE 198*   < > 278* 281*  BUN 16   < > 19 19  CREATININE 0.61   < > 0.67 0.57*  CALCIUM 8.2*   < > 9.4 9.3  MG 2.1  --   --  1.8  PHOS 2.9  --   --   --    < > = values in this interval not displayed.    Lipid Panel:     Component Value Date/Time   CHOL 110 11/19/2017 0437   CHOL 119 08/09/2017 1000   TRIG 340 (H) 11/19/2017 0437   HDL 19 (L) 11/19/2017 0437   HDL 29 (L) 08/09/2017 1000   CHOLHDL 5.8 11/19/2017 0437   VLDL 68 (H) 11/19/2017 0437   LDLCALC 23 11/19/2017 0437   LDLCALC 30 08/09/2017 1000   HgbA1c:  Lab Results  Component Value Date   HGBA1C  9.1 (H) 11/19/2017   Urine Drug Screen: No results found for: LABOPIA, COCAINSCRNUR, LABBENZ, AMPHETMU, THCU, LABBARB  Alcohol Level No results found for: Emery I have personally reviewed the radiological images below and agree with the radiology interpretations.  Ct Angio Head W Or Wo Contrast  Ct Angio Neck W Or Wo Contrast 11/18/2017 IMPRESSION:  Since the study of earlier today, there are only 2 changes. There is new consolidation and volume loss in the right upper lobe. 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 Flow is seen distal to that however, presumably indicating patent posterior communicating arteries.   Ct Angio Head W Or Wo Contrast Ct Angio Neck W Or Wo Contrast 11/18/2017 IMPRESSION:  1. Severe intracranial and extracranial atherosclerotic disease. No emergent large vessel occlusion  2. Severe stenosis right carotid bifurcation due to calcified plaque, estimated 90% diameter stenosis. No significant left carotid stenosis. Severe stenosis cavernous carotid  bilaterally.  3. Both vertebral arteries are patent in the neck with severe calcific stenosis V4 segment bilaterally.  4. 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 Small calcified lymph nodes left neck suspicious for metastatic disease  5. Small lung nodules on the right, possible metastatic disease. Recommend chest CT for further evaluation.   Ct Head Wo Contrast 11/19/2017 IMPRESSION:  1. Expected evolution of brainstem infarct without hemorrhage.  2. No new infarcts.  3. Left vertebral artery and basilar artery stent.  4. Acute on chronic sinus disease may be related to intubation.   Ct Chest W Contrast 11/18/2017 IMPRESSION Right upper lobe airspace opacity is noted with air bronchograms concerning for pneumonia or possibly atelectasis. Stable mediastinal adenopathy is noted compared to prior exam. Endotracheal and nasogastric tubes are in grossly good position. Coronary artery calcifications are noted suggesting coronary artery disease. Possible 2.8 cm left thyroid nodule. Thyroid ultrasound is recommended for further evaluation. Aortic Atherosclerosis (ICD10-I70.0).   Mr Brain Wo Contrast 11/19/2017 IMPRESSION:  1. 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 No associated hemorrhage or significant mass effect.  2. Additional punctate acute/early subacute infarctions within left lateral cerebellum and right parietal cortex.  3. Minimal chronic microvascular ischemic changes of the brain and mild parenchymal volume loss.   Ct Head Code Stroke Wo Contrast 11/18/2017 IMPRESSION:  1. No acute intracranial abnormality.  Atherosclerotic calcification  2. ASPECTS is 10   Cerebral Angiogram / Stent  11/18/2017 1. 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 Occluded non dominant RT VBJ. 2. S/P endovascular  revascularization of the distal basilar artery  To 90 % patency , and of 30 to 40 % of the LT VBJ with stent placement. Dominant  Lt Pcom.  Transthoracic Echocardiogram - Left ventricle: The cavity size was moderately dilated. Wall   thickness was increased in a pattern of severe LVH. Systolic   function was normal. The estimated ejection fraction was in the   range of 55% to 60%. Wall motion was normal; there were no   regional wall motion abnormalities. The study is not technically   sufficient to allow evaluation of LV diastolic function. - Aortic valve: Trileaflet. Sclerosis without stenosis. There was   no regurgitation. Valve area (Vmax): 2.88 cm^2. - Aorta: Aortic root ML diameter: 41.95 mm (ED). - Aortic root: The aortic root is mildly dilated. - Mitral valve: Calcified annulus. There  was trivial regurgitation. - Left atrium: The atrium was mildly dilated. - Right ventricle: The cavity size was mildly dilated. Mildly   reduced systolic function. - Inferior vena cava: The vessel was normal in size. The   respirophasic diameter changes were in the normal range (>= 50%),   consistent with normal central venous pressure. Impressions: - Compared to a prior study in 02/2017, there have been no   significant changes.  Ct Head Wo Contrast 11/24/2017 CLINICAL DATA:  Follow-up examination for stroke. EXAM: CT HEAD WITHOUT CONTRAST TECHNIQUE: Contiguous axial images were obtained from the base of the skull through the vertex without intravenous contrast. COMPARISON:  Prior CT from 11/19/2017 as well as earlier studies. FINDINGS: Brain: Normal expected interval evolution of previously identified pontine infarct. No evidence for hemorrhagic transformation or other complication. Adjacent basilar cisterns and fourth ventricle remain patent. No acute intracranial hemorrhage. No new large vessel territory infarct. No extra-axial fluid collection. Ventricles stable in size without hydrocephalus.  Vascular: Vertebrobasilar stent in place, stable. Calcified atherosclerotic change at the skull base again noted. No new hyperdense vessel. Skull: Scalp soft tissues and calvarium within normal limits. Sinuses/Orbits: Globes and orbital soft tissues normal. Moderate mucosal thickening throughout the paranasal sinuses. Fluid within the partially visualized nasopharynx. Bilateral mastoid effusions. Patient likely intubated. Other: None.  IMPRESSION:  1. Continued normal expected interval evolution of brainstem infarct. No evidence for hemorrhagic transformation or other complication.  2. No other new intracranial abnormality.  3. Moderate paranasal sinus disease with bilateral mastoid effusions, likely related intubation.     Dg Chest Port 1 View 11/25/2017 CLINICAL DATA:  Intubation EXAM: PORTABLE CHEST 1 VIEW COMPARISON:  11/23/2017 FINDINGS: Endotracheal tube and nasogastric catheter are noted in satisfactory position. Right-sided PICC line is again seen in satisfactory position. Patient is significantly rotated to the right accentuating the mediastinal markings. Mild vascular congestion is noted without focal confluent infiltrate.  IMPRESSION:  Mild vascular congestion. Tubes and lines as described.    Ct Soft Tissue Neck W Contrast  Result Date: 11/26/2017 CLINICAL DATA:  Palpable neck nodule versus thyroid enlargement. Pre biopsy evaluation. EXAM: CT NECK WITH CONTRAST TECHNIQUE: Multidetector CT imaging of the neck was performed using the standard protocol following the bolus administration of intravenous contrast. CONTRAST:  166mL OMNIPAQUE IOHEXOL 300 MG/ML  SOLN COMPARISON:  CT angiography 11/18/2017 FINDINGS: Pharynx and larynx: Endotracheal tube and orogastric tube in place. No mucosal lesion identified. Salivary glands: Parotid and submandibular glands are normal. Thyroid: As shown previously, there is a mass arising from the inferior thyroid on the left measuring approximately 5 x 3.4 x 5.5  cm with irregular margins and calcifications. There appears to be invasion of the superior mediastinum. Tumor partially encases the trachea and has a broad surface along the esophagus. Lymph nodes: Small calcified lower cervical lymph nodes are present. Supraclavicular nodes and superior mediastinal nodes are present as well, worrisome for extensive regional metastatic disease. Vascular: Carotid and vertebrobasilar atherosclerosis as seen previously. Recent placement of a basilar stent. Limited intracranial: No discernible acute intracranial finding. Visualized orbits: Negative Mastoids and visualized paranasal sinuses: Mucosal inflammatory changes of the paranasal sinuses. Skeleton: Ordinary cervical spondylosis. Upper chest: Bilateral layering pleural effusions with dependent atelectasis. Lung detail is limited. Other: None IMPRESSION:  Aside from the fact that the patient shows endotracheal intubation and the presence of an orogastric tube, and aside from development of bilateral layering pleural effusions with dependent atelectasis, the findings are unchanged since the previous studies. Irregular mass arising  from the inferior left thyroid lobe, measuring approximately 5 x 3.4 x 5.5 cm, with invasion of the surrounding tissues worrisome for thyroid carcinoma. Partial encasement of the trachea. Broad surface along the esophagus. Small but likely pathologic nodes in the lower neck and superior mediastinum, some with calcification, probably involved by metastatic disease.    PHYSICAL EXAM  Temp:  [98.5 F (36.9 C)-100.1 F (37.8 C)] 99 F (37.2 C) (07/07 1200) Pulse Rate:  [56-111] 98 (07/07 1200) Resp:  [0-30] 24 (07/07 1200) BP: (107-134)/(66-85) 125/72 (07/07 1200) SpO2:  [86 %-97 %] 97 % (07/07 1200) FiO2 (%):  [40 %] 40 % (07/07 1144) Weight:  [348 lb 1.7 oz (157.9 kg)] 348 lb 1.7 oz (157.9 kg) (07/07 0500)  General - morbid obesity,middle aged Caucasian male, intubated on  sedation.  Ophthalmologic - fundi not visualized due to noncooperation.  Cardiovascular - irregularly irregular heart rate and rhythm, afib RVR.  Neuro - intubated on sedation,  Eyes closed, not following simple commands. Eyes open but not blinking to visual threat, pinpoint pupils, not reactive to light, eye dysconjugate with right eye inward position, right eye mid position, sluggish dolls eyes and no corneal but positive gag. Not blinking to visual threat bilaterally. Facial symmetry not able to test due to ET tube, not able to move tongue or mouth on command. No movement on all extremities, DTR diminished and no babinski. Sensation, coordination and gait not tested.    ASSESSMENT/PLAN Mr. Serafin Decatur is a 62 y.o. male with PMH of DM, hemoptysis, OSA, HTN, HLD, CAD, afib off Eliquis due to hemoptysis presenting with slurred speech, right sided weakness. Was intubated for hemoptysis prevention while giving tPA. Symptoms worsened and imaging confirmed BA occlusion. S/p IR with revascularization of BA as well as left VA s/p stenting.  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   MRI head - large acute/early subacute infarction within the central pons involving both sides, greater on the right.  CTA H&N - severe vascular athero including BA stenosis, b/l VA stenosis, right ICA proximal 90% stenosis, b/l cavernous ICA stenosis R>L.  Repeat CTA H/N - new distal BA occlusion  DSA - revascularization of the distal basilar artery to 90 % patency , and of 30 to 40 % of the LT VBJ with stent placement.  CT head 11/24/17  showed normal evolution of brainstem infarct, no acute change  2D Echo - EF 55-60%  LDL - 23  HgbA1c - 9.1  VTE prophylaxis - heparin subq  Eliquis (recently stopped) and plavix prior to admission, now on aspirin 81 mg daily and Brilinta . Hold off AC for now due to hemoptysis and pending biopsy.   Ongoing aggressive stroke risk factor management  Therapy recommendations:  pending  Disposition:  Pending  Thyroid mass  CTA neck - right large thyroid mass compressing on trachea.   Thyroid mass larger than CT chest in 08/2016  Evidence of tracheal erosion on bronch at this admission  CT neck with contrast showed thyroid mass concerning for carcinoma, encasement of trachea.   Pending thyroid vs. Lymph node biopsy with IR next week  Based on thyroid biopsy,  will consider oncology consult  Respiratory failure  CCM on board  Current intubated on sedation  no cuff leak  Fighting with vent when off sedation  Likely need trach if not able to wean off vent  Do not think brilinta is a contraindication for trach  PAF  Currently in afib RVR with respiratory distress  On eliquis in the past but off due to hemoptysis  Hold off at this time due to hemoptysis and upcoming biopsy  On ASA and brilinta  Coreg bid for rate control  Cerebral vascular athero  CTA head and neck showed right ICA proximal 90% stenosis, left ICA proximal athero, b/l cavernous ICA severe athro R>L, BA severe stenosis, b/l V4 severe athero with right V4 occlusion  S/p left V4 stent and BA revascularization  On ASA and brilinta  Hemoptysis, improved  Since 08/2016 as per family  Not sure about etiology  TB ruled out in the past  Possible tracheal erosion from right thyroid mass   CT chest showed scattered long nodules, RUE suspected PNA  CT neck showed thyroid mass concerning for carcinoma, encasement of trachea.   Plan for thyroid vs. Lymph node biopsy next week  CCM on  board  Fever   Tmax 102.4->102.5->100.0->98.3->100.8  CXR - progression of bibasilar consolidation -> Mild vascular congestion is noted without focal confluent infiltrate  Off antibiotics  Hypertension  Stable on the low side  Now on coreg bid  Also on HCTZ for limb swelling . Long term BP goal normotensive.  Hyperlipidemia  Lipid lowering medication PTA: Crestor 10 mg daily  LDL 23, goal < 70  On Crestor  Continue statin at discharge  Diabetes  HgbA1c 9.1, goal < 7.0  Uncontrolled  Off insulin drip, CBG fluctuate  On lantus and SSI   CBG monitoring  Other Stroke Risk Factors  Advanced age  Former cigarette smoker - quit, 40 pack year history  Morbid obesity, Body mass index is 49.95 kg/m.    Hx stroke/TIA  Coronary artery disease   PAD with claudication  OSA on CPAP  Other Active Problems  Leukocytosis WBC 13.1->9.6->10.7->8.2->8.1->10.0->8.2  I have personally examined this patient, reviewed notes, independently viewed imaging studies, participated in medical decision making and plan of care.ROS completed by me personally and pertinent positives fully documented  I have made any additions or clarifications directly to the above note. The patient presents a challenging situation. Given his recent intracranial stent he needs to be on aspirin and Brilinta but he may need to come off it for thyroid or lymph node biopsy and he also has A. Fib and will eventually need anticoagulation with eliquis. His prognosis appears quite guarded given his quadriplegia from his brainstem infarct and he may not be a candidate for thyroid cancer surgery, chemotherapy or radiation at least in the immediate future. Long discussion with the patient's girlfriend and sister-in-law the bedside and answered questions. Discussed with Dr. Marolyn Hammock.This patient is critically ill and at significant risk of neurological worsening, death and care requires constant monitoring of vital  signs, hemodynamics,respiratory and cardiac monitoring, extensive review of multiple databases, frequent neurological assessment, discussion with family, other specialists and medical decision making of high complexity.I have made any additions or clarifications directly to the above note.This critical care time does not reflect procedure time, or teaching time or supervisory time of PA/NP/Med Resident etc but could involve care discussion time.  I spent 50 minutes of neurocritical care time  in the care of  this  patient.      Antony Contras, MD Medical Director [facility] Pager: [phone] 11/28/2017 1:36 PM     To contact Stroke Continuity provider, please refer to [email or web address] After hours, contact General Neurology

## 2017-11-29 ENCOUNTER — Inpatient Hospital Stay (HOSPITAL_COMMUNITY): Payer: 59

## 2017-11-29 ENCOUNTER — Encounter (HOSPITAL_COMMUNITY): Payer: Self-pay | Admitting: Interventional Radiology

## 2017-11-29 HISTORY — PX: IR US GUIDE BX ASP/DRAIN: IMG2392

## 2017-11-29 LAB — BASIC METABOLIC PANEL
Anion gap: 3 — ABNORMAL LOW (ref 5–15)
BUN: 21 mg/dL (ref 8–23)
CHLORIDE: 103 mmol/L (ref 98–111)
CO2: 28 mmol/L (ref 22–32)
Calcium: 8.6 mg/dL — ABNORMAL LOW (ref 8.9–10.3)
Creatinine, Ser: 0.57 mg/dL — ABNORMAL LOW (ref 0.61–1.24)
GFR calc Af Amer: >60 mL/min (ref >60)
GFR calc non Af Amer: >60 mL/min (ref >60)
GLUCOSE: 242 mg/dL — AB (ref 70–99)
POTASSIUM: 4.8 mmol/L (ref 3.5–5.1)
SODIUM: 134 mmol/L — AB (ref 135–145)

## 2017-11-29 LAB — CBC WITH DIFFERENTIAL/PLATELET
ABS IMMATURE GRANULOCYTES: 0.1 10*3/uL (ref 0.0–0.1)
Basophils Absolute: 0.1 10*3/uL (ref 0.0–0.1)
Basophils Relative: 1 %
Eosinophils Absolute: 0.5 10*3/uL (ref 0.0–0.7)
Eosinophils Relative: 6 %
HCT: 30.6 % — ABNORMAL LOW (ref 39.0–52.0)
HEMOGLOBIN: 9.4 g/dL — AB (ref 13.0–17.0)
Immature Granulocytes: 1 %
LYMPHS PCT: 12 %
Lymphs Abs: 1.1 10*3/uL (ref 0.7–4.0)
MCH: 27.7 pg (ref 26.0–34.0)
MCHC: 30.7 g/dL (ref 30.0–36.0)
MCV: 90.3 fL (ref 78.0–100.0)
MONOS PCT: 7 %
Monocytes Absolute: 0.7 10*3/uL (ref 0.1–1.0)
NEUTROS PCT: 73 %
Neutro Abs: 6.8 10*3/uL (ref 1.7–7.7)
Platelets: 229 10*3/uL (ref 150–400)
RBC: 3.39 MIL/uL — ABNORMAL LOW (ref 4.22–5.81)
RDW: 13.9 % (ref 11.5–15.5)
WBC: 9.3 10*3/uL (ref 4.0–10.5)

## 2017-11-29 LAB — POCT I-STAT 3, ART BLOOD GAS (G3+)
Acid-Base Excess: 3 mmol/L — ABNORMAL HIGH (ref 0.0–2.0)
Bicarbonate: 26.9 mmol/L (ref 20.0–28.0)
O2 Saturation: 95 %
PH ART: 7.445 (ref 7.350–7.450)
TCO2: 28 mmol/L (ref 22–32)
pCO2 arterial: 39.1 mmHg (ref 32.0–48.0)
pO2, Arterial: 71 mmHg — ABNORMAL LOW (ref 83.0–108.0)

## 2017-11-29 LAB — GLUCOSE, CAPILLARY
GLUCOSE-CAPILLARY: 248 mg/dL — AB (ref 70–99)
GLUCOSE-CAPILLARY: 185 mg/dL — AB (ref 70–99)
Glucose-Capillary: 196 mg/dL — ABNORMAL HIGH (ref 70–99)
Glucose-Capillary: 200 mg/dL — ABNORMAL HIGH (ref 70–99)
Glucose-Capillary: 209 mg/dL — ABNORMAL HIGH (ref 70–99)
Glucose-Capillary: 211 mg/dL — ABNORMAL HIGH (ref 70–99)

## 2017-11-29 LAB — MAGNESIUM: MAGNESIUM: 1.9 mg/dL (ref 1.7–2.4)

## 2017-11-29 MED ORDER — MIDAZOLAM HCL 2 MG/2ML IJ SOLN
INTRAMUSCULAR | Status: AC
  Filled 2017-11-29: qty 2

## 2017-11-29 MED ORDER — VITAL HIGH PROTEIN PO LIQD
1000.0000 mL | ORAL | Status: DC
  Administered 2017-11-30 – 2017-12-08 (×13): 1000 mL
  Filled 2017-11-29 (×4): qty 1000

## 2017-11-29 MED ORDER — LIDOCAINE-EPINEPHRINE (PF) 1 %-1:200000 IJ SOLN
INTRAMUSCULAR | Status: AC
  Filled 2017-11-29: qty 30

## 2017-11-29 MED ORDER — LIDOCAINE HCL 1 % IJ SOLN
INTRAMUSCULAR | Status: AC
  Filled 2017-11-29: qty 20

## 2017-11-29 MED ORDER — LIDOCAINE HCL (PF) 1 % IJ SOLN
INTRAMUSCULAR | Status: DC | PRN
  Administered 2017-11-29: 10 mL

## 2017-11-29 NOTE — Consult Note (Addendum)
Chief Complaint: Patient was seen in consultation today for lymphadenopathy.  Referring Physician(s): Tarry Kos  Supervising Physician: Sandi Mariscal  Patient Status: [name] [name] - In-pt  History of Present Illness: [name] [name] is a 62 y.o. male with a past medical history of hypertension, CAD, collagen vascular disease, high cholesterol, CVA 10/2017, pneumonia, GERD, diabetes mellitus type 2, and OSA. He presented to ED 11/18/2017 with complaints of left-sided weakness and slurred speech. He was found to have CVA and admitted for management. He underwent cerebral intervention 11/19/2017 with Dr. Estanislado Pandy. He has been managed by neurology since.  CT soft tissue neck 11/26/2017: 1. Aside from the fact that the patient shows endotracheal intubation and the presence of an orogastric tube, and aside from development of bilateral layering pleural effusions with dependent atelectasis, the findings are unchanged since the previous studies.  2. Irregular mass arising from the inferior left thyroid lobe, measuring approximately 5 x 3.4 x 5.5 cm, with invasion of the surrounding tissues worrisome for thyroid carcinoma.  3. Partial encasement of the trachea.  4. Broad surface along the esophagus. 5. Small but likely pathologic nodes in the lower neck and superior mediastinum, some with calcification, probably involved by metastatic disease.  IR requested by Dr. Debbora Dus for possible image-guided cervical lymph node biopsy. Patient intubated and sedated laying in bed. Accompanied by girlfriend at bedside. History difficult to obtain due to intubation and sedation.  Patient is on Heparin which has been held. Patient is also on Brilinta and Aspirin which have not been held.   Past Medical History:  Diagnosis Date  . Collagen vascular disease ([place])   . Coronary artery disease   . GERD (gastroesophageal reflux disease)   . High cholesterol   . Hypertension   . OSA (obstructive sleep apnea)      "dx'd years ago; never have worn mask; mask never ordered" (04/01/2017)  . Pneumonia 2011  . Type II diabetes mellitus ([place]) 07/2016    Past Surgical History:  Procedure Laterality Date  . CORONARY ANGIOPLASTY WITH STENT PLACEMENT  2011; 07/2016   s/p PCI  . IR ANGIO EXTRACRAN SEL COM CAROTID INNOMINATE UNI BILAT MOD SED  11/18/2017  . IR ANGIO VERTEBRAL SEL SUBCLAVIAN INNOMINATE UNI R MOD SED  11/18/2017  . IR INTRA CRAN STENT  11/18/2017  . LOWER EXTREMITY ANGIOGRAPHY Bilateral 04/01/2017   Procedure: Lower Extremity Angiography;  Surgeon: Lorretta Harp, MD;  Location: [facility];  Service: Cardiovascular;  Laterality: Bilateral;  . PERIPHERAL VASCULAR INTERVENTION Left 04/01/2017   Procedure: PERIPHERAL VASCULAR INTERVENTION;  Surgeon: Lorretta Harp, MD;  Location: [facility];  Service: Cardiovascular;  Laterality: Left;  common iliac  . RADIOLOGY WITH ANESTHESIA N/A 11/18/2017   Procedure: RADIOLOGY WITH ANESTHESIA;  Surgeon: Luanne Bras, MD;  Location: [facility];  Service: Radiology;  Laterality: N/A;  . VIDEO BRONCHOSCOPY Bilateral 08/25/2016   Procedure: VIDEO BRONCHOSCOPY WITHOUT FLUORO;  Surgeon: Collene Gobble, MD;  Location: [facility];  Service: Cardiopulmonary;  Laterality: Bilateral;    Allergies: Patient has no known allergies.  Medications: Prior to Admission medications   Medication Sig Start Date End Date Taking? Authorizing Provider  apixaban (ELIQUIS) 5 MG TABS tablet Take 1 tablet (5 mg total) 2 (two) times daily by mouth. 04/05/17  Yes Revankar, Reita Cliche, MD  dronedarone (MULTAQ) 400 MG tablet Take 1 tablet (400 mg total) 2 (two) times daily with a meal by mouth. 04/05/17  Yes Revankar, Reita Cliche, MD  irbesartan (  AVAPRO) 75 MG tablet TAKE ONE TABLET BY MOUTH ONE TIME DAILY  05/31/17  Yes Tanda Rockers, MD  liraglutide (VICTOZA) 18 MG/3ML SOPN Inject 0.2 mLs (1.2 mg total) into the skin daily at 10 pm. Start with 0.6 mg for one week, then  increase to 1.2 mg daily. 06/25/17  Yes Lorella Nimrod, MD  metoprolol tartrate (LOPRESSOR) 50 MG tablet Take 1 tablet (50 mg total) by mouth 2 (two) times daily. 11/01/17 04/30/18 Yes Revankar, Reita Cliche, MD  Omega-3 Fatty Acids (FISH OIL) 1000 MG CAPS Take 2 capsules (2,000 mg total) by mouth 2 (two) times daily. 08/23/17  Yes Revankar, Reita Cliche, MD  pantoprazole (PROTONIX) 40 MG tablet TAKE 1 TABLET BY MOUTH ONCE A DAY 30 TO 60 MINUTES BEFORE FIRST MEAL OF THE DAY  07/09/17  Yes Tanda Rockers, MD  B Complex Vitamins (VITAMIN B COMPLEX PO) Take 1 capsule at bedtime by mouth.     [provider]  clopidogrel (PLAVIX) 75 MG tablet Take 1 tablet (75 mg total) by mouth daily. Patient not taking: Reported on 11/20/2017 09/11/16   Maryellen Pile, MD  famotidine (PEPCID) 20 MG tablet One at bedtime 01/15/17   Tanda Rockers, MD  fenofibrate 160 MG tablet Take 1 tablet (160 mg total) by mouth daily. 06/14/17   Revankar, Reita Cliche, MD  metFORMIN (GLUCOPHAGE) 1000 MG tablet Take 1,000 mg by mouth 2 (two) times daily with a meal.    [provider]  nitroGLYCERIN (NITROSTAT) 0.4 MG SL tablet Place 1 tablet (0.4 mg total) under the tongue every 5 (five) minutes as needed for chest pain. 08/13/17 11/18/17  Revankar, Reita Cliche, MD  rosuvastatin (CRESTOR) 10 MG tablet Take 5 mg by mouth at bedtime.    [provider]     Family History  Problem Relation Age of Onset  . Alzheimer's disease Mother   . Breast cancer Sister     Social History   Socioeconomic History  . Marital status: Divorced    Spouse name: Not on file  . Number of children: Not on file  . Years of education: Not on file  . Highest education level: Not on file  Occupational History  . Not on file  Social Needs  . Financial resource strain: Not on file  . Food insecurity:    Worry: Not on file    Inability: Not on file  . Transportation needs:    Medical: Not on file    Non-medical: Not on file  Tobacco Use  .  Smoking status: Former Smoker    Packs/day: 1.00    Years: 40.00    Pack years: 40.00    Types: Cigarettes    Last attempt to quit: 2014    Years since quitting: 5.5  . Smokeless tobacco: Never Used  Substance and Sexual Activity  . Alcohol use: Yes    Comment: 04/01/2017 "couple drinks once/month"  . Drug use: No    Types: Marijuana    Comment: marijuana in the 70s  . Sexual activity: Not Currently  Lifestyle  . Physical activity:    Days per week: Not on file    Minutes per session: Not on file  . Stress: Not on file  Relationships  . Social connections:    Talks on phone: Not on file    Gets together: Not on file    Attends religious service: Not on file    Active member of club or organization: Not on file  Attends meetings of clubs or organizations: Not on file    Relationship status: Not on file  Other Topics Concern  . Not on file  Social History Narrative  . Not on file     Review of Systems: A 12 point ROS discussed and pertinent positives are indicated in the HPI above.  All other systems are negative.  Review of Systems  Unable to perform ROS: Intubated    Vital Signs: BP 106/71 (BP Location: Left Arm)   Pulse 85   Temp 99.4 F (37.4 C) (Axillary)   Resp (!) 23   Ht 5\' 10"  (1.778 m)   Wt (!) 348 lb 8.8 oz (158.1 kg)   SpO2 93%   BMI 50.01 kg/m   Physical Exam  Constitutional: He appears well-developed and well-nourished. No distress.  Intubated and sedated.  Cardiovascular: Normal rate, regular rhythm and normal heart sounds.  No murmur heard. Pulmonary/Chest: Effort normal and breath sounds normal. No respiratory distress. He has no wheezes.  Intubated.  Neurological: He is alert.  Intubated.  Skin: Skin is warm and dry.  Psychiatric: He has a normal mood and affect. His behavior is normal. Judgment and thought content normal.  Nursing note and vitals reviewed.    MD Evaluation Airway: Other (comments) Airway comments:  Intubated Heart: WNL Abdomen: WNL Chest/ Lungs: WNL ASA  Classification: 3 Mallampati/Airway Score: Three(Intubated)   Imaging: Ct Angio Head W Or Wo Contrast  Result Date: 11/18/2017 CLINICAL DATA:  Clinical deterioration. Concern regarding basilar thrombosis. EXAM: CT ANGIOGRAPHY HEAD AND NECK TECHNIQUE: Multidetector CT imaging of the head and neck was performed using the standard protocol during bolus administration of intravenous contrast. Multiplanar CT image reconstructions and MIPs were obtained to evaluate the vascular anatomy. Carotid stenosis measurements (when applicable) are obtained utilizing NASCET criteria, using the distal internal carotid diameter as the denominator. CONTRAST:  167mL ISOVUE-370 IOPAMIDOL (ISOVUE-370) INJECTION 76% COMPARISON:  Earlier same day. FINDINGS: CT HEAD FINDINGS Brain: No change when compared to the previous head CT. Generalized atrophy. Chronic small-vessel changes of the pons and hemispheric white matter. No sign of acute infarction by CT. No hemorrhage. No hydrocephalus or extra-axial collection. Vascular: Extensive atherosclerotic calcification of the major vessels at the base the brain. Skull: Negative Sinuses: Clear Orbits: Negative Review of the MIP images confirms the above findings CTA NECK FINDINGS Aortic arch: Atherosclerotic change without aneurysm or dissection. Right carotid system: Common carotid artery widely patent to the bifurcation region. Advanced calcified and soft plaque at the carotid bifurcation and proximal ICA. Than what diameter of the proximal ICA is 1 mm or less, consistent with 90% or greater stenosis. Beyond that, vessel shows reduced caliber but does show antegrade flow. Left carotid system: Common carotid artery shows some atherosclerotic plaque but no stenosis. At the left carotid bifurcation, there is soft and calcified plaque. At the distal bulb, minimal diameter is 3.5 mm. Compared to a more distal cervical ICA diameter of 5  mm, this indicates a 30% stenosis. Cervical ICA widely patent beyond that. Vertebral arteries: Vertebral artery origins are patent. Vertebral arteries are fairly small vessels but are patent through the cervical region to foramen magnum. Skeleton: No significant bone finding. 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 See prior report. Upper chest: New consolidation in the right upper lobe. Possible small pulmonary nodules. Superior mediastinal adenopathy. See prior report. Review of the MIP images confirms the above  findings CTA HEAD FINDINGS Anterior circulation: Both internal carotid arteries are patent through the siphon regions with extensive atherosclerotic calcification. Stenosis in the siphon regions is estimated at 50-70% on each side. Supraclinoid internal carotid arteries are patent. Flow is seen in both anterior and middle cerebral vessels without discernible missing branches. Posterior circulation: Severe calcified atherosclerotic disease of the V4 segments on each side with severe stenoses. Accurate measurement not possible, but stenosis is quite likely approaching 90% on each side. There is a proximal basilar stenosis estimated at 70%. Since the study of earlier today, there is occlusion of the distal basilar artery proximal to the superior cerebellar and the posterior cerebral arteries. These vessels do show flow, presumably indicating patent posterior communicating vessels. Venous sinuses: Patent and normal. Anatomic variants: None other significant. Delayed phase: No abnormal enhancement. Review of the MIP images confirms the above findings IMPRESSION: Since the study of earlier today, there are only 2 changes. There is new consolidation and volume loss in the right upper lobe. 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 Flow is seen distal to that however, presumably indicating patent posterior  communicating arteries. The study was discussed at the time of performance with Dr. Leonel Ramsay. Electronically Signed   By: Nelson Chimes M.D.   On: 11/18/2017 13:41   Ct Angio Head W Or Wo Contrast  Result Date: 11/18/2017 CLINICAL DATA:  Stroke.  Suspect cerebral hemorrhage EXAM: CT ANGIOGRAPHY HEAD AND NECK TECHNIQUE: Multidetector CT imaging of the head and neck was performed using the standard protocol during bolus administration of intravenous contrast. Multiplanar CT image reconstructions and MIPs were obtained to evaluate the vascular anatomy. Carotid stenosis measurements (when applicable) are obtained utilizing NASCET criteria, using the distal internal carotid diameter as the denominator. CONTRAST:  85mL ISOVUE-370 IOPAMIDOL (ISOVUE-370) INJECTION 76% COMPARISON:  CT head 11/18/2017 FINDINGS: CTA NECK FINDINGS Aortic arch: Atherosclerotic calcification aortic arch without aneurysm or dissection. Right carotid system: Atherosclerotic calcification right carotid bifurcation and proximal internal carotid artery. Severe stenosis proximal right internal carotid artery. Accurate measurement difficult due to heavily calcified vessel but this appears to be greater than 90% diameter stenosis. Left carotid system: Mild atherosclerotic disease left carotid bifurcation without significant carotid stenosis. Vertebral arteries: Both vertebral arteries widely patent in the neck without significant stenosis. Severe stenosis distal vertebral artery bilaterally due to heavily calcified plaque. Skeleton: Negative Other neck: Left thyroid mass measures 2.5 x 4 cm with heterogeneous enhancement and coarse calcification, highly suspicious for carcinoma. Partially calcified 10 mm lymph node in the left neck level 3 node possibly metastatic node. Partially calcified left level 3 node 9 mm axial image 111 also suspicious for metastatic disease. Upper chest: 6.6 mm right upper lobe nodule. 5.5 mm right upper lobe nodule  posteriorly. Left lung apex clear. Review of the MIP images confirms the above findings CTA HEAD FINDINGS Anterior circulation: Extensive atherosclerotic calcification in the cavernous carotid bilaterally. There appears to be severe stenosis in the right cavernous carotid and moderate stenosis on the left. Suboptimal opacification of the intracranial arteries due to timing of the injection. Diffuse atherosclerotic disease with mild diffuse narrowing of the left middle cerebral artery. Moderate stenosis left middle cerebral artery branch supplying the temporal lobe. Right middle cerebral artery branches show mild disease. Anterior cerebral arteries patent bilaterally with diffuse disease. Posterior circulation: Heavily calcified plaque in severe stenosis distal vertebral artery bilaterally. Basilar artery diffusely diseased with mild stenosis. Posterior cerebral arteries patent bilaterally without significant stenosis. Venous sinuses: Patent Anatomic variants:  None Delayed phase: Not performed Review of the MIP images confirms the above findings IMPRESSION: 1. Severe intracranial and extracranial atherosclerotic disease. No emergent large vessel occlusion 2. Severe stenosis right carotid bifurcation due to calcified plaque, estimated 90% diameter stenosis. No significant left carotid stenosis. Severe stenosis cavernous carotid bilaterally. 3. Both vertebral arteries are patent in the neck with severe calcific stenosis V4 segment bilaterally. 4. 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 Small calcified lymph nodes left neck suspicious for metastatic disease 5. Small lung nodules on the right, possible metastatic disease. Recommend chest CT for further evaluation. 6. These results were called by telephone at the time of interpretation on 11/18/2017 at 10:00 am to Dr. Leonel Ramsay , who verbally acknowledged these results. Electronically Signed   By: Franchot Gallo M.D.   On: 11/18/2017 10:10   Ct  Head Wo Contrast  Result Date: 11/24/2017 CLINICAL DATA:  Follow-up examination for stroke. EXAM: CT HEAD WITHOUT CONTRAST TECHNIQUE: Contiguous axial images were obtained from the base of the skull through the vertex without intravenous contrast. COMPARISON:  Prior CT from 11/19/2017 as well as earlier studies. FINDINGS: Brain: Normal expected interval evolution of previously identified pontine infarct. No evidence for hemorrhagic transformation or other complication. Adjacent basilar cisterns and fourth ventricle remain patent. No acute intracranial hemorrhage. No new large vessel territory infarct. No extra-axial fluid collection. Ventricles stable in size without hydrocephalus. Vascular: Vertebrobasilar stent in place, stable. Calcified atherosclerotic change at the skull base again noted. No new hyperdense vessel. Skull: Scalp soft tissues and calvarium within normal limits. Sinuses/Orbits: Globes and orbital soft tissues normal. Moderate mucosal thickening throughout the paranasal sinuses. Fluid within the partially visualized nasopharynx. Bilateral mastoid effusions. Patient likely intubated. Other: None. IMPRESSION: 1. Continued normal expected interval evolution of brainstem infarct. No evidence for hemorrhagic transformation or other complication. 2. No other new intracranial abnormality. 3. Moderate paranasal sinus disease with bilateral mastoid effusions, likely related intubation. Electronically Signed   By: Jeannine Boga M.D.   On: 11/24/2017 15:21   Ct Head Wo Contrast  Result Date: 11/19/2017 CLINICAL DATA:  Severe stenosis of the distal vertebral arteries. Proximal basilar artery stenosis. Distal basilar artery occlusion. Pontine infarct. EXAM: CT HEAD WITHOUT CONTRAST TECHNIQUE: Contiguous axial images were obtained from the base of the skull through the vertex without intravenous contrast. COMPARISON:  MRI brain 11/19/2017 12:49 a.m.  Are FINDINGS: Brain: Hypoattenuating area  associated with the pontine infarct is again seen. There is no interval hemorrhage. Brainstem and cerebellum are otherwise normal. No supratentorial infarct is present. No acute hemorrhage or mass lesion is present. Vascular: Left distal vertebral artery stent extends into the basilar artery. Atherosclerotic calcifications are present at the dural margin of both vertebral arteries and within the cavernous internal carotid arteries. There is no hyperdense vessel. Skull: Calvarium is intact. No focal lytic or blastic lesions are present. Sinuses/Orbits: Diffuse mucosal thickening is present throughout the ethmoid air cells. Fluid levels are present in the sphenoid sinuses, frontal sinuses, and maxillary sinuses. The mastoid air cells are clear. Bilateral exophthalmos is stable. Globes and orbits are otherwise within normal limits. IMPRESSION: 1. Expected evolution of brainstem infarct without hemorrhage. 2. No new infarcts. 3. Left vertebral artery and basilar artery stent. 4. Acute on chronic sinus disease may be related to intubation. Electronically Signed   By: San Morelle M.D.   On: 11/19/2017 11:40   Ct Soft Tissue Neck W Contrast  Result Date: 11/26/2017 CLINICAL DATA:  Palpable  neck nodule versus thyroid enlargement. Pre biopsy evaluation. EXAM: CT NECK WITH CONTRAST TECHNIQUE: Multidetector CT imaging of the neck was performed using the standard protocol following the bolus administration of intravenous contrast. CONTRAST:  136mL OMNIPAQUE IOHEXOL 300 MG/ML  SOLN COMPARISON:  CT angiography 11/18/2017 FINDINGS: Pharynx and larynx: Endotracheal tube and orogastric tube in place. No mucosal lesion identified. Salivary glands: Parotid and submandibular glands are normal. Thyroid: As shown previously, there is a mass arising from the inferior thyroid on the left measuring approximately 5 x 3.4 x 5.5 cm with irregular margins and calcifications. There appears to be invasion of the superior mediastinum.  Tumor partially encases the trachea and has a broad surface along the esophagus. Lymph nodes: Small calcified lower cervical lymph nodes are present. Supraclavicular nodes and superior mediastinal nodes are present as well, worrisome for extensive regional metastatic disease. Vascular: Carotid and vertebrobasilar atherosclerosis as seen previously. Recent placement of a basilar stent. Limited intracranial: No discernible acute intracranial finding. Visualized orbits: Negative Mastoids and visualized paranasal sinuses: Mucosal inflammatory changes of the paranasal sinuses. Skeleton: Ordinary cervical spondylosis. Upper chest: Bilateral layering pleural effusions with dependent atelectasis. Lung detail is limited. Other: None IMPRESSION: Aside from the fact that the patient shows endotracheal intubation and the presence of an orogastric tube, and aside from development of bilateral layering pleural effusions with dependent atelectasis, the findings are unchanged since the previous studies. Irregular mass arising from the inferior left thyroid lobe, measuring approximately 5 x 3.4 x 5.5 cm, with invasion of the surrounding tissues worrisome for thyroid carcinoma. Partial encasement of the trachea. Broad surface along the esophagus. Small but likely pathologic nodes in the lower neck and superior mediastinum, some with calcification, probably involved by metastatic disease. Electronically Signed   By: Nelson Chimes M.D.   On: 11/26/2017 11:35   Ct Angio Neck W Or Wo Contrast  Result Date: 11/18/2017 CLINICAL DATA:  Clinical deterioration. Concern regarding basilar thrombosis. EXAM: CT ANGIOGRAPHY HEAD AND NECK TECHNIQUE: Multidetector CT imaging of the head and neck was performed using the standard protocol during bolus administration of intravenous contrast. Multiplanar CT image reconstructions and MIPs were obtained to evaluate the vascular anatomy. Carotid stenosis measurements (when applicable) are obtained  utilizing NASCET criteria, using the distal internal carotid diameter as the denominator. CONTRAST:  185mL ISOVUE-370 IOPAMIDOL (ISOVUE-370) INJECTION 76% COMPARISON:  Earlier same day. FINDINGS: CT HEAD FINDINGS Brain: No change when compared to the previous head CT. Generalized atrophy. Chronic small-vessel changes of the pons and hemispheric white matter. No sign of acute infarction by CT. No hemorrhage. No hydrocephalus or extra-axial collection. Vascular: Extensive atherosclerotic calcification of the major vessels at the base the brain. Skull: Negative Sinuses: Clear Orbits: Negative Review of the MIP images confirms the above findings CTA NECK FINDINGS Aortic arch: Atherosclerotic change without aneurysm or dissection. Right carotid system: Common carotid artery widely patent to the bifurcation region. Advanced calcified and soft plaque at the carotid bifurcation and proximal ICA. Than what diameter of the proximal ICA is 1 mm or less, consistent with 90% or greater stenosis. Beyond that, vessel shows reduced caliber but does show antegrade flow. Left carotid system: Common carotid artery shows some atherosclerotic plaque but no stenosis. At the left carotid bifurcation, there is soft and calcified plaque. At the distal bulb, minimal diameter is 3.5 mm. Compared to a more distal cervical ICA diameter of 5 mm, this indicates a 30% stenosis. Cervical ICA widely patent beyond that. Vertebral arteries: Vertebral artery origins are  patent. Vertebral arteries are fairly small vessels but are patent through the cervical region to foramen magnum. Skeleton: No significant bone finding. 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 See prior report. Upper chest: New consolidation in the right upper lobe. Possible small pulmonary nodules. Superior mediastinal adenopathy. See prior report. Review of the MIP images confirms the above  findings CTA HEAD FINDINGS Anterior circulation: Both internal carotid arteries are patent through the siphon regions with extensive atherosclerotic calcification. Stenosis in the siphon regions is estimated at 50-70% on each side. Supraclinoid internal carotid arteries are patent. Flow is seen in both anterior and middle cerebral vessels without discernible missing branches. Posterior circulation: Severe calcified atherosclerotic disease of the V4 segments on each side with severe stenoses. Accurate measurement not possible, but stenosis is quite likely approaching 90% on each side. There is a proximal basilar stenosis estimated at 70%. Since the study of earlier today, there is occlusion of the distal basilar artery proximal to the superior cerebellar and the posterior cerebral arteries. These vessels do show flow, presumably indicating patent posterior communicating vessels. Venous sinuses: Patent and normal. Anatomic variants: None other significant. Delayed phase: No abnormal enhancement. Review of the MIP images confirms the above findings IMPRESSION: Since the study of earlier today, there are only 2 changes. There is new consolidation and volume loss in the right upper lobe. 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 Flow is seen distal to that however, presumably indicating patent posterior communicating arteries. The study was discussed at the time of performance with Dr. Leonel Ramsay. Electronically Signed   By: Nelson Chimes M.D.   On: 11/18/2017 13:41   Ct Angio Neck W Or Wo Contrast  Result Date: 11/18/2017 CLINICAL DATA:  Stroke.  Suspect cerebral hemorrhage EXAM: CT ANGIOGRAPHY HEAD AND NECK TECHNIQUE: Multidetector CT imaging of the head and neck was performed using the standard protocol during bolus administration of intravenous contrast. Multiplanar CT image reconstructions and MIPs were obtained to evaluate the vascular anatomy. Carotid stenosis measurements (when applicable) are obtained  utilizing NASCET criteria, using the distal internal carotid diameter as the denominator. CONTRAST:  69mL ISOVUE-370 IOPAMIDOL (ISOVUE-370) INJECTION 76% COMPARISON:  CT head 11/18/2017 FINDINGS: CTA NECK FINDINGS Aortic arch: Atherosclerotic calcification aortic arch without aneurysm or dissection. Right carotid system: Atherosclerotic calcification right carotid bifurcation and proximal internal carotid artery. Severe stenosis proximal right internal carotid artery. Accurate measurement difficult due to heavily calcified vessel but this appears to be greater than 90% diameter stenosis. Left carotid system: Mild atherosclerotic disease left carotid bifurcation without significant carotid stenosis. Vertebral arteries: Both vertebral arteries widely patent in the neck without significant stenosis. Severe stenosis distal vertebral artery bilaterally due to heavily calcified plaque. Skeleton: Negative Other neck: Left thyroid mass measures 2.5 x 4 cm with heterogeneous enhancement and coarse calcification, highly suspicious for carcinoma. Partially calcified 10 mm lymph node in the left neck level 3 node possibly metastatic node. Partially calcified left level 3 node 9 mm axial image 111 also suspicious for metastatic disease. Upper chest: 6.6 mm right upper lobe nodule. 5.5 mm right upper lobe nodule posteriorly. Left lung apex clear. Review of the MIP images confirms the above findings CTA HEAD FINDINGS Anterior circulation: Extensive atherosclerotic calcification in the cavernous carotid bilaterally. There appears to be severe stenosis in the right cavernous carotid and moderate stenosis on the left. Suboptimal opacification of the intracranial arteries due to timing of  the injection. Diffuse atherosclerotic disease with mild diffuse narrowing of the left middle cerebral artery. Moderate stenosis left middle cerebral artery branch supplying the temporal lobe. Right middle cerebral artery branches show mild disease.  Anterior cerebral arteries patent bilaterally with diffuse disease. Posterior circulation: Heavily calcified plaque in severe stenosis distal vertebral artery bilaterally. Basilar artery diffusely diseased with mild stenosis. Posterior cerebral arteries patent bilaterally without significant stenosis. Venous sinuses: Patent Anatomic variants: None Delayed phase: Not performed Review of the MIP images confirms the above findings IMPRESSION: 1. Severe intracranial and extracranial atherosclerotic disease. No emergent large vessel occlusion 2. Severe stenosis right carotid bifurcation due to calcified plaque, estimated 90% diameter stenosis. No significant left carotid stenosis. Severe stenosis cavernous carotid bilaterally. 3. Both vertebral arteries are patent in the neck with severe calcific stenosis V4 segment bilaterally. 4. 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 Small calcified lymph nodes left neck suspicious for metastatic disease 5. Small lung nodules on the right, possible metastatic disease. Recommend chest CT for further evaluation. 6. These results were called by telephone at the time of interpretation on 11/18/2017 at 10:00 am to Dr. Leonel Ramsay , who verbally acknowledged these results. Electronically Signed   By: Franchot Gallo M.D.   On: 11/18/2017 10:10   Ct Chest W Contrast  Result Date: 11/18/2017 CLINICAL DATA:  Unable to follow commands. EXAM: CT CHEST WITH CONTRAST TECHNIQUE: Multidetector CT imaging of the chest was performed during intravenous contrast administration. CONTRAST:  187mL ISOVUE-370 IOPAMIDOL (ISOVUE-370) INJECTION 76% COMPARISON:  Radiograph of same day.  CT scan of August 23, 2016. FINDINGS: Cardiovascular: Atherosclerosis of thoracic aorta is noted without aneurysm formation. Coronary artery calcifications are noted. No pericardial effusion is noted. Mild cardiomegaly is noted. Mediastinum/Nodes: Endotracheal and nasogastric tubes are in grossly good  position. Stable 1 cm lymph node seen in aortopulmonary window. Stable 1.3 cm right paratracheal lymph node. Stable 1 cm right subcarinal lymph node. Bilateral calcified thyroid nodules are again noted. The largest measures 2.8 cm on the left. Lungs/Pleura: No pneumothorax or significant pleural effusion is noted. Right upper lobe airspace opacity with air bronchograms is noted concerning for pneumonia or atelectasis. Minimal bibasilar subsegmental atelectasis is noted. Stable 8 mm nodule seen in lingular segment of left upper lobe. Upper Abdomen: No acute abnormality. Musculoskeletal: No chest wall abnormality. No acute or significant osseous findings. IMPRESSION: Right upper lobe airspace opacity is noted with air bronchograms concerning for pneumonia or possibly atelectasis. Stable mediastinal adenopathy is noted compared to prior exam. Endotracheal and nasogastric tubes are in grossly good position. Coronary artery calcifications are noted suggesting coronary artery disease. Possible 2.8 cm left thyroid nodule. Thyroid ultrasound is recommended for further evaluation. Aortic Atherosclerosis (ICD10-I70.0). Electronically Signed   By: Marijo Conception, M.D.   On: 11/18/2017 13:47   Mr Brain Wo Contrast  Result Date: 11/19/2017 CLINICAL DATA:  62 y/o M; findings of stroke with progression of symptoms post basilar intervention. EXAM: MRI HEAD WITHOUT CONTRAST TECHNIQUE: Multiplanar, multiecho pulse sequences of the brain and surrounding structures were obtained without intravenous contrast. COMPARISON:  11/18/2017 CTA and CT of the head. 11/18/2017 cerebral angiography. FINDINGS: Brain: Reduced diffusion compatible with acute/early subacute infarction within the central pons measuring 2.4 x 2.6 x 1.1 cm (volume = 3.6 cm^3)(AP x ML x CC series 5, image 58 and series 7, image 56). The region of reduced diffusion is involves both sides of the pons, greater on the right. There additional punctate foci of  infarction  within the left cerebellar hemisphere (series 5, image 53) and the right parietal cortex (series 5, image 77). No associated hemorrhage or significant mass effect. Minimal chronic microvascular ischemic changes of the brain and mild brain parenchymal volume loss. Single punctate focus of susceptibility hypointensity within the right cerebellar hemisphere compatible with hemosiderin deposition of chronic microhemorrhage. No extra-axial collection, hydrocephalus, or herniation. Vascular: Normal flow voids. Skull and upper cervical spine: Normal marrow signal. Sinuses/Orbits: Diffuse paranasal sinus mucosal thickening and fluid levels likely due to intubation. Orbits are unremarkable. Other: None. IMPRESSION: 1. 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 No associated hemorrhage or significant mass effect. 2. Additional punctate acute/early subacute infarctions within left lateral cerebellum and right parietal cortex. 3. Minimal chronic microvascular ischemic changes of the brain and mild parenchymal volume loss. These results will be called to the ordering clinician or representative by the Radiologist Assistant, and communication documented in the PACS or zVision Dashboard. Electronically Signed   By: Kristine Garbe M.D.   On: 11/19/2017 01:37   Ir Intra Cran Stent  Result Date: 11/22/2017 INDICATION: Acute deterioration and level of consciousness. 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 EXAM: 1. EMERGENT LARGE VESSEL OCCLUSION THROMBOLYSIS (POSTERIOR CIRCULATION) COMPARISON:  CT angiogram of the head and neck of 11/19/2017. MEDICATIONS: 3 g Ancef IV antibiotic was administered within 1 hour of the procedure. ANESTHESIA/SEDATION: General anesthesia. CONTRAST:  Isovue 300 approximately 130 mL. FLUOROSCOPY TIME:  Fluoroscopy Time: 96 minutes 42 seconds (7834 mGy). COMPLICATIONS: None immediate.  TECHNIQUE: Following a full explanation of the procedure along with the potential associated complications, an informed witnessed consent was obtained from the patient's spouse and children. The risks of intracranial hemorrhage of 10%, worsening neurological deficit, ventilator dependency, death and inability to revascularize were all reviewed in detail with the patient's family. The patient was then put under general anesthesia by the Department of Anesthesiology at [name]. The right groin was prepped and draped in the usual sterile fashion. Thereafter using modified Seldinger technique, transfemoral access into the right common femoral artery was obtained without difficulty. Over a 0.035 inch Amplatz stiff guidewire an 8 French 55 cm neurovascular sheath was inserted. Through this, and also over a 0.035 inch guidewire a 5 [place] JB 1 catheter was advanced to the aortic arch region and selectively positioned in the right subclavian artery, the right common carotid artery, the left common carotid artery and the left vertebral artery. FINDINGS: The right common carotid arteriogram demonstrates approximately 70% stenosis of the right external carotid artery at its origin. Its branches are, otherwise, normally opacified. The right internal carotid artery just distal to the bulb has a severe 90% plus stenosis. Distal to this there is a double U- shaped tortuosity of the proximal right internal carotid artery. Distal to this the right internal carotid artery assumes a normal course to the cranial skull base. The petrous, the cavernous and the supraclinoid segments are widely patent. The right middle cerebral artery and the right anterior cerebral artery opacify into the capillary and venous phases. Cross-filling via the anterior communicating artery of the left anterior cerebral artery A2 segment and distally is noted. Also seen is a small infundibulum at the origin of the right posterior communicating  artery. The right subclavian arteriogram demonstrates the right vertebral artery origin to be widely patent. The vessel is seen to ascend unimpeded to the cranial skull  base where it supplies the ipsilateral right posterior-inferior cerebellar artery. There is no opacification distal to this. The left common carotid arteriogram demonstrates the left external carotid artery origin to be narrowed by 50%. Its branches are normally opacified otherwise. The left internal carotid artery at the bulb demonstrates wide patency. There is a U-shaped tortuosity of the proximal 1/3 of the left internal carotid artery. More distally, the vessel is seen to opacify normally to the cranial skull base. The petrous segment is unremarkable. There is mild fusiform dilatation of the cavernous segment. The distal cavernous segment and the supraclinoid segments are widely patent. A left posterior communicating artery is seen opacifying the left posterior cerebral artery distribution with retrograde partial opacification of the distal basilar artery and the superior cerebellar arteries. The left middle cerebral artery and the left anterior cerebral artery opacify into the capillary and venous phases. Cross-filling via the anterior communicating artery of the right anterior cerebral artery A2 segment is noted. The left vertebral artery origin demonstrates wide patency. The vessel is seen to opacify normally to the cranial skull base. Wide patency is seen of the left posterior-inferior cerebellar artery. Distal to this there is severe pre occlusive stenosis of the vertebrobasilar junction just proximal to the basilar artery. Distal to this there is another area of severe pre occlusive stenosis of the mid basilar artery just distal to the origin of the anterior-inferior cerebellar arteries. Distal to this the distal basilar artery, the right posterior cerebral artery opacify into the capillary and venous phases. PROCEDURE: ENDOVASCULAR  REVASCULARIZATION OF TANDEM SEVERE STENOSIS OF THE BASILAR ARTERY AND THE LEFT VERTEBROBASILAR JUNCTION/BASILAR ARTERY JUNCTION. The diagnostic JB 1 catheter in the distal left subclavian artery was exchanged over a 0.035 inch 300 cm Rosen exchange guidewire for a 6 French 80 cm Cook shuttle sheath using biplane roadmap technique and constant fluoroscopic guidance. Good aspiration obtained from the hub of the [name] shuttle sheath. A gentle contrast injection demonstrated no evidence of spasms, dissections or of intraluminal filling defects. Over a 0.035 inch Roadrunner guidewire, a 115 cm 5 [place] Navien guide catheter was advanced to the origin of the left vertebral artery. The guidewire was then advanced distally into the left vertebral artery to the cranial skull base followed by the Navien guide catheter. At this time the [name] shuttle sheath was also advanced into the proximal 1/3 of the left vertebral artery. The guidewire was removed from the 5 [place] Navien guide catheter. Good aspiration obtained from the hub of the Navien guide catheter. Gentle contrast injection demonstrated no evidence of spasms, dissections or of intraluminal filling defects. A control arteriogram was then performed from the 5 [place] Navien guide catheter in the left vertebrobasilar junction proximal to the origin of the left posterior-inferior cerebellar artery. This confirmed the areas of severe stenosis as described earlier. Measurements were then performed of the basilar artery distally and also in the mid segment proximal to the anterior-inferior cerebellar artery, and also proximal to the proximal area of severe stenosis. It was decided to primarily revascularize both these areas with a single stent. To this effect, over a 0.014 inch Softip Synchro micro guidewire, an 021 Trevo ProVue microcatheter was advanced to the distal tip of the Navien guide catheter in the left vertebrobasilar junction. With the micro guidewire leading with  a J-tip configuration to avoid dissections or inducing spasm, manipulating the guidewire was performed with a torque device across the two areas of severe stenosis and into the right posterior cerebral artery  P1 P2 segment. This was then followed by the microcatheter. The guidewire was removed. Good aspiration obtained from the hub of the 021 microcatheter which was now in the right posterior cerebral artery P1 segment. A gentle control arteriogram demonstrated good antegrade flow distally. This was then connected to continuous heparinized saline infusion. It was decided to use a [employer] vascular reconstruction device measuring 4 mm x 39 mm. This was then prepped and purged with heparinized saline infusion. In a coaxial manner and with constant heparinized saline infusion this was advanced to the distal end of the microcatheter. The O ring on the delivery microcatheter was then loosened. With slight forward gentle traction with the right hand on the delivery micro guidewire, with left hand the delivery microcatheter was unsheathing the distal portion of the device in the distal basilar artery followed by the proximal portion which was delivered proximal to the proximal severe focal area of stenosis. Under constant fluoroscopic guidance, the delivery microcatheter and the guidewire were retrieved and removed. Control arteriograms were then performed at 15, 30 and 60 minutes post deployment of the endovascular stent. These continued to demonstrate excellent flow through the stented segments of the distal basilar artery and the proximal basilar artery/left vertebrobasilar junction. The distal portion was noted to be patent by 90%. The proximal stented segment appeared to be patent by 30-40% with good antegrade flow. During this time, multiple attempts were made to advance a Gateway balloon microcatheter over a 0.014 inch micro guidewire in order to angioplasty more proximal side. However, each time there was  movement of the stent noted. Because of this, it was decided to leave the proximal lesion without angioplasty at 30-40% patency. Excellent perfusion was maintained in the left vertebrobasilar junction. No other areas of occlusions were seen. The superior cerebellar arteries and the anterior-inferior cerebellar arteries appeared widely patent. The 5 [place] Navien guide catheter and the [employer] sheath were then retrieved and removed. The 55 cm 8 French neurovascular sheath were left in situ and connected to continuous heparinized saline infusion. Because the patient had received IV tPA it was decided to leave the neurovascular sheath. The right groin was soft without evidence of a hemorrhage. Throughout the procedure, the patient's blood pressure and neurological status remained stable. No angiographic evidence of extravasation or of intraluminal filling defects or mass-effect was noted. A Dyna CT of the head performed on the table revealed no evidence of hemorrhage, mass effect or of intraventricular blood or subarachnoid hemorrhage. The patient was then transported to the neuro ICU intubated for further medical and stroke management. Prior to the placement of the stent to treat the two areas of severe stenosis, the patient was loaded with 180 mg of Brilinta, and 85 mg of aspirin via an orogastric tube. Also during procedure, the patient received a total of 4.5 mg of super selective intracranial intra-arterial Integrilin in order to overcome the possibly of intra stent thrombus formation. 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 Occluded right vertebral artery just distal to the  right posterior-inferior cerebellar artery. PLAN: Patient transferred to the ICU for further medical management. Electronically Signed   By: Luanne Bras M.D.   On: 11/19/2017 15:04   Dg Chest Port 1  View  Result Date: 11/29/2017 CLINICAL DATA:  Hypoxia EXAM: PORTABLE CHEST 1 VIEW COMPARISON:  November 27, 2017 FINDINGS: Endotracheal tube tip is 6.0 cm above the carina. Nasogastric tube tip and side port are below the diaphragm. Central catheter tip is in the superior vena cava. No pneumothorax. There is moderate interstitial pulmonary edema with small pleural effusions. There is cardiomegaly with pulmonary venous hypertension. There is no appreciable airspace consolidation. No evident adenopathy. No bone lesions. IMPRESSION: Tube and catheter positions as described without pneumothorax. There is pulmonary vascular congestion with interstitial edema and small pleural effusions. Suspect a degree of underlying congestive heart failure. No airspace consolidation. Electronically Signed   By: Lowella Grip III M.D.   On: 11/29/2017 07:06   Dg Chest Port 1 View  Result Date: 11/27/2017 CLINICAL DATA:  Hypoxia EXAM: PORTABLE CHEST 1 VIEW COMPARISON:  November 25, 2017 FINDINGS: Endotracheal tube tip is 7.1 cm above the carina. Nasogastric tube tip and side port are below the diaphragm. Central catheter tip is in the superior vena cava. No pneumothorax. There is slight interstitial pulmonary edema. No consolidation. There are small pleural effusions bilaterally. There is cardiomegaly with pulmonary venous hypertension. IMPRESSION: Tube and catheter positions as described without pneumothorax. There is pulmonary vascular congestion with slight interstitial edema and small pleural effusions bilaterally. No frank consolidation. Cardiac silhouette stable. Electronically Signed   By: Lowella Grip III M.D.   On: 11/27/2017 07:17   Dg Chest Port 1 View  Result Date: 11/25/2017 CLINICAL DATA:  Intubation EXAM:  PORTABLE CHEST 1 VIEW COMPARISON:  11/23/2017 FINDINGS: Endotracheal tube and nasogastric catheter are noted in satisfactory position. Right-sided PICC line is again seen in satisfactory position. Patient is significantly rotated to the right accentuating the mediastinal markings. Mild vascular congestion is noted without focal confluent infiltrate. IMPRESSION: Mild vascular congestion. Tubes and lines as described. Electronically Signed   By: Inez Catalina M.D.   On: 11/25/2017 07:56   Dg Chest Port 1 View  Result Date: 11/23/2017 CLINICAL DATA:  62 year old male with fever.  Subsequent encounter. EXAM: PORTABLE CHEST 1 VIEW COMPARISON:  11/22/2017 chest x-ray. FINDINGS: Endotracheal tube tip 7 cm above the carina impressing upon right lateral wall. Nasogastric tube courses below the diaphragm. Tip is not included on the present exam. Right PICC line tip distal superior vena cava level. Cardiomegaly. Pulmonary vascular congestion. Small pleural effusions. Findings may represent component of mild pulmonary edema. Bibasilar consolidation progressive in the right lower lobe may represent infiltrate or atelectasis. IMPRESSION: Progression of bibasilar consolidation greater on the right which may represent infiltrate or atelectasis. Cardiomegaly and mild pulmonary edema similar to prior exam. Electronically Signed   By: Genia Del M.D.   On: 11/23/2017 10:33   Dg Chest Port 1 View  Result Date: 11/22/2017 CLINICAL DATA:  62 year old male status post PICC line placement. EXAM: PORTABLE CHEST 1 VIEW COMPARISON:  0006 hours today and earlier. FINDINGS: Portable AP semi upright view at 1320 hours. Right side PICC line is in place. The tip is partially obscured by external EKG leads but likely at the cavoatrial junction level. Stable cardiac size and mediastinal contours. Stable endotracheal tube tip just below the clavicles. Enteric tube courses to the abdomen, tip not included. Mildly larger lung volumes and  improved bibasilar ventilation. No new pulmonary process. IMPRESSION: Right PICC line in place, tip projects at the cavoatrial junction. Mildly improved ventilation. Electronically Signed   By: Genevie Ann M.D.   On: 11/22/2017 13:34  Dg Chest Port 1 View  Result Date: 11/22/2017 CLINICAL DATA:  62 year old male with hypoxia. EXAM: PORTABLE CHEST 1 VIEW COMPARISON:  Chest radiograph dated 11/21/2017 FINDINGS: Endotracheal tube with tip approximately 5.6 cm above the carina. Enteric tube extends below the diaphragm with tip beyond the inferior margin of the image. There is cardiomegaly with increased vascular and interstitial prominence and probable small pleural effusions most consistent with CHF. Superimposed pneumonia is not excluded. Clinical correlation is recommended. There is no pneumothorax. Overall no significant change in the degree of congestion compared to the prior radiograph. No acute osseous pathology. IMPRESSION: Cardiomegaly with CHF and no significant interval change. Stable positioning of the support devices. Electronically Signed   By: Anner Crete M.D.   On: 11/22/2017 00:32   Dg Chest Port 1 View  Result Date: 11/21/2017 CLINICAL DATA:  Acute respiratory failure EXAM: PORTABLE CHEST 1 VIEW COMPARISON:  11/19/2017 FINDINGS: Cardiac shadow remains enlarged. Endotracheal tube and nasogastric catheter are again seen and stable. Lungs are well aerated bilaterally. Diffuse interstitial changes are noted consistent with some mild vascular congestion. No definitive interstitial edema is noted. No focal infiltrate or pneumothorax is seen. No bony abnormality is noted. IMPRESSION: Mild increasing congestive failure when compare with the prior exam. Electronically Signed   By: Inez Catalina M.D.   On: 11/21/2017 14:07   Dg Chest Port 1 View  Result Date: 11/19/2017 CLINICAL DATA:  Evaluate endotracheal tube.  Stroke. EXAM: PORTABLE CHEST 1 VIEW COMPARISON:  Chest radiograph 11/18/2017 FINDINGS:  Endotracheal tube is at the level of the clavicular heads and approximately 6.5 cm above the carina. Streaky densities in the lower chest most likely associated with atelectasis. Overall, slightly improved aeration in the lungs. Heart size is upper limits of normal. Nasogastric tube extends into the abdomen. Negative for a pneumothorax. IMPRESSION: Stable appearance of the endotracheal tube. Nasogastric tube extends into the abdomen. Slightly improved aeration in lungs may represent decreasing atelectasis or edema. Electronically Signed   By: Markus Daft M.D.   On: 11/19/2017 08:59   Dg Chest Port 1 View  Result Date: 11/18/2017 CLINICAL DATA:  Stroke symptoms. EXAM: PORTABLE CHEST 1 VIEW COMPARISON:  01/15/2017. FINDINGS: Endotracheal tube noted with tip 5 cm above the carina. NG tube noted with tip below left hemidiaphragm. Cardiomegaly with pulmonary vascular congestion and bilateral interstitial prominence suggesting CHF. No pleural effusion or pneumothorax. IMPRESSION: 1. Endotracheal tube tip noted 5 cm above the carina. NG tube tip noted below left hemidiaphragm. 2. Cardiomegaly with pulmonary venous congestion bilateral interstitial prominence suggesting mild CHF. Electronically Signed   By: Marcello Moores  Register   On: 11/18/2017 11:20   Dg Abd Portable 1v  Result Date: 11/20/2017 CLINICAL DATA:  Evaluate OG tube placement EXAM: PORTABLE ABDOMEN - 1 VIEW COMPARISON:  None. FINDINGS: The OG tube terminates in the region of the gastric body. IMPRESSION: The OG tube terminates in the region of the gastric body. Electronically Signed   By: Dorise Bullion III M.D   On: 11/20/2017 18:51   Ct Head Code Stroke Wo Contrast  Result Date: 11/18/2017 CLINICAL DATA:  Code stroke.  Left-sided deficit, slurred speech EXAM: CT HEAD WITHOUT CONTRAST TECHNIQUE: Contiguous axial images were obtained from the base of the skull through the vertex without intravenous contrast. COMPARISON:  None. FINDINGS: Brain: No  evidence of acute infarction, hemorrhage, hydrocephalus, extra-axial collection or mass lesion/mass effect. Vascular: Negative for hyperdense vessel. Calcification in distal vertebral and carotid arteries is advanced bilaterally Skull: Negative Sinuses/Orbits:  Negative Other: None ASPECTS ([place] Stroke Program Early CT Score) - Ganglionic level infarction (caudate, lentiform nuclei, internal capsule, insula, M1-M3 cortex): 7 - Supraganglionic infarction (M4-M6 cortex): 3 Total score (0-10 with 10 being normal): 10 IMPRESSION: 1. No acute intracranial abnormality.  Atherosclerotic calcification 2. ASPECTS is 10 Electronically Signed   By: Franchot Gallo M.D.   On: 11/18/2017 09:54   [place] Ekg Site Rite  Result Date: 11/22/2017 If Site Rite image not attached, placement could not be confirmed due to current cardiac rhythm.  Ir Angio Extracran Sel Com Carotid Innominate Uni Bilat Mod Sed  Result Date: 11/22/2017 INDICATION: Acute deterioration and level of consciousness. 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 EXAM: 1. EMERGENT LARGE VESSEL OCCLUSION THROMBOLYSIS (POSTERIOR CIRCULATION) COMPARISON:  CT angiogram of the head and neck of 11/19/2017. MEDICATIONS: 3 g Ancef IV antibiotic was administered within 1 hour of the procedure. ANESTHESIA/SEDATION: General anesthesia. CONTRAST:  Isovue 300 approximately 130 mL. FLUOROSCOPY TIME:  Fluoroscopy Time: 96 minutes 42 seconds (7834 mGy). COMPLICATIONS: None immediate. TECHNIQUE: Following a full explanation of the procedure along with the potential associated complications, an informed witnessed consent was obtained from the patient's spouse and children. The risks of intracranial hemorrhage of 10%, worsening neurological deficit, ventilator dependency, death and inability to revascularize were all reviewed in detail with the patient's family. The patient was then put under general anesthesia by the Department of Anesthesiology at [name] The right groin was prepped and draped in the usual sterile fashion. Thereafter using modified Seldinger technique, transfemoral access into the right common femoral artery was obtained without difficulty. Over a 0.035 inch Amplatz stiff guidewire an 8 French 55 cm neurovascular sheath was inserted. Through this, and also over a 0.035 inch guidewire a 5 [place] JB 1 catheter was advanced to the aortic arch region and selectively positioned in the right subclavian artery, the right common carotid artery, the left common carotid artery and the left vertebral artery. FINDINGS: The right common carotid arteriogram demonstrates approximately 70% stenosis of the right external carotid artery at its origin. Its branches are, otherwise, normally opacified. The right internal carotid artery just distal to the bulb has a severe 90% plus stenosis. Distal to this there is a double U- shaped tortuosity of the proximal right internal carotid artery. Distal to this the right internal carotid artery assumes a normal course to the cranial skull base. The petrous, the cavernous and the supraclinoid segments are widely patent. The right middle cerebral artery and the right anterior cerebral artery opacify into the capillary and venous phases. Cross-filling via the anterior communicating artery of the left anterior cerebral artery A2 segment and distally is noted. Also seen is a small infundibulum at the origin of the right posterior communicating artery. The right subclavian arteriogram demonstrates the right vertebral artery origin to be widely patent. The vessel is seen to ascend unimpeded to the cranial skull base where it supplies the ipsilateral right posterior-inferior cerebellar artery. There is no opacification distal to this. The left common carotid arteriogram demonstrates the left external carotid artery origin to be narrowed by 50%. Its branches are normally opacified otherwise. The left internal carotid artery at  the bulb demonstrates wide patency. There is a U-shaped tortuosity of the proximal 1/3 of the left internal carotid artery. More distally, the vessel is seen to opacify normally to the cranial skull base. The petrous segment is unremarkable. There is mild fusiform dilatation of the cavernous segment. The  distal cavernous segment and the supraclinoid segments are widely patent. A left posterior communicating artery is seen opacifying the left posterior cerebral artery distribution with retrograde partial opacification of the distal basilar artery and the superior cerebellar arteries. The left middle cerebral artery and the left anterior cerebral artery opacify into the capillary and venous phases. Cross-filling via the anterior communicating artery of the right anterior cerebral artery A2 segment is noted. The left vertebral artery origin demonstrates wide patency. The vessel is seen to opacify normally to the cranial skull base. Wide patency is seen of the left posterior-inferior cerebellar artery. Distal to this there is severe pre occlusive stenosis of the vertebrobasilar junction just proximal to the basilar artery. Distal to this there is another area of severe pre occlusive stenosis of the mid basilar artery just distal to the origin of the anterior-inferior cerebellar arteries. Distal to this the distal basilar artery, the right posterior cerebral artery opacify into the capillary and venous phases. 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 The diagnostic JB 1 catheter in the distal left subclavian artery was exchanged over a 0.035 inch 300 cm Rosen exchange guidewire for a 6 French 80 cm Cook shuttle sheath using biplane roadmap technique and constant fluoroscopic guidance. Good aspiration obtained from the hub of the [facility] shuttle sheath. A gentle contrast injection demonstrated no evidence of  spasms, dissections or of intraluminal filling defects. Over a 0.035 inch Roadrunner guidewire, a 115 cm 5 [place] Navien guide catheter was advanced to the origin of the left vertebral artery. The guidewire was then advanced distally into the left vertebral artery to the cranial skull base followed by the Navien guide catheter. At this time the [facility] shuttle sheath was also advanced into the proximal 1/3 of the left vertebral artery. The guidewire was removed from the 5 [place] Navien guide catheter. Good aspiration obtained from the hub of the Navien guide catheter. Gentle contrast injection demonstrated no evidence of spasms, dissections or of intraluminal filling defects. A control arteriogram was then performed from the 5 [place] Navien guide catheter in the left vertebrobasilar junction proximal to the origin of the left posterior-inferior cerebellar artery. This confirmed the areas of severe stenosis as described earlier. Measurements were then performed of the basilar artery distally and also in the mid segment proximal to the anterior-inferior cerebellar artery, and also proximal to the proximal area of severe stenosis. It was decided to primarily revascularize both these areas with a single stent. To this effect, over a 0.014 inch Softip Synchro micro guidewire, an 021 Trevo ProVue microcatheter was advanced to the distal tip of the Navien guide catheter in the left vertebrobasilar junction. With the micro guidewire leading with a J-tip configuration to avoid dissections or inducing spasm, manipulating the guidewire was performed with a torque device across the two areas of severe stenosis and into the right posterior cerebral artery P1 P2 segment. This was then followed by the microcatheter. The guidewire was removed. Good aspiration obtained from the hub of the 021 microcatheter which was now in the right posterior cerebral artery P1 segment. A gentle control arteriogram demonstrated good antegrade flow  distally. This was then connected to continuous heparinized saline infusion. It was decided to use a [employer] vascular reconstruction device measuring 4 mm x 39 mm. This was then prepped and purged with heparinized saline infusion. In a coaxial manner and with constant heparinized saline infusion this was advanced to the  distal end of the microcatheter. The O ring on the delivery microcatheter was then loosened. With slight forward gentle traction with the right hand on the delivery micro guidewire, with left hand the delivery microcatheter was unsheathing the distal portion of the device in the distal basilar artery followed by the proximal portion which was delivered proximal to the proximal severe focal area of stenosis. Under constant fluoroscopic guidance, the delivery microcatheter and the guidewire were retrieved and removed. Control arteriograms were then performed at 15, 30 and 60 minutes post deployment of the endovascular stent. These continued to demonstrate excellent flow through the stented segments of the distal basilar artery and the proximal basilar artery/left vertebrobasilar junction. The distal portion was noted to be patent by 90%. The proximal stented segment appeared to be patent by 30-40% with good antegrade flow. During this time, multiple attempts were made to advance a Gateway balloon microcatheter over a 0.014 inch micro guidewire in order to angioplasty more proximal side. However, each time there was movement of the stent noted. Because of this, it was decided to leave the proximal lesion without angioplasty at 30-40% patency. Excellent perfusion was maintained in the left vertebrobasilar junction. No other areas of occlusions were seen. The superior cerebellar arteries and the anterior-inferior cerebellar arteries appeared widely patent. The 5 [place] Navien guide catheter and the [employer] sheath were then retrieved and removed. The 55 cm 8 French neurovascular sheath were  left in situ and connected to continuous heparinized saline infusion. Because the patient had received IV tPA it was decided to leave the neurovascular sheath. The right groin was soft without evidence of a hemorrhage. Throughout the procedure, the patient's blood pressure and neurological status remained stable. No angiographic evidence of extravasation or of intraluminal filling defects or mass-effect was noted. A Dyna CT of the head performed on the table revealed no evidence of hemorrhage, mass effect or of intraventricular blood or subarachnoid hemorrhage. The patient was then transported to the neuro ICU intubated for further medical and stroke management. Prior to the placement of the stent to treat the two areas of severe stenosis, the patient was loaded with 180 mg of Brilinta, and 85 mg of aspirin via an orogastric tube. Also during procedure, the patient received a total of 4.5 mg of super selective intracranial intra-arterial Integrilin in order to overcome the possibly of intra stent thrombus formation. 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 PLAN: Patient transferred to the ICU for further medical management. Electronically Signed   By: Luanne Bras M.D.   On: 11/19/2017 15:04   Ir Angio Vertebral Sel Subclavian Innominate Uni R Mod Sed  Result Date: 11/22/2017 INDICATION: Acute deterioration and level of consciousness. 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 EXAM: 1. EMERGENT LARGE VESSEL OCCLUSION THROMBOLYSIS (POSTERIOR CIRCULATION) COMPARISON:   CT angiogram of the head and neck of 11/19/2017. MEDICATIONS: 3 g Ancef IV antibiotic was administered within 1 hour of the procedure. ANESTHESIA/SEDATION: General anesthesia. CONTRAST:  Isovue 300 approximately 130 mL. FLUOROSCOPY TIME:  Fluoroscopy Time: 96 minutes 42 seconds (7834 mGy). COMPLICATIONS: None immediate. TECHNIQUE: Following a full explanation of the procedure along  with the potential associated complications, an informed witnessed consent was obtained from the patient's spouse and children. The risks of intracranial hemorrhage of 10%, worsening neurological deficit, ventilator dependency, death and inability to revascularize were all reviewed in detail with the patient's family. The patient was then put under general anesthesia by the Department of Anesthesiology at [facility]. The right groin was prepped and draped in the usual sterile fashion. Thereafter using modified Seldinger technique, transfemoral access into the right common femoral artery was obtained without difficulty. Over a 0.035 inch Amplatz stiff guidewire an 8 French 55 cm neurovascular sheath was inserted. Through this, and also over a 0.035 inch guidewire a 5 [place] JB 1 catheter was advanced to the aortic arch region and selectively positioned in the right subclavian artery, the right common carotid artery, the left common carotid artery and the left vertebral artery. FINDINGS: The right common carotid arteriogram demonstrates approximately 70% stenosis of the right external carotid artery at its origin. Its branches are, otherwise, normally opacified. The right internal carotid artery just distal to the bulb has a severe 90% plus stenosis. Distal to this there is a double U- shaped tortuosity of the proximal right internal carotid artery. Distal to this the right internal carotid artery assumes a normal course to the cranial skull base. The petrous, the cavernous and the supraclinoid segments are widely patent. The  right middle cerebral artery and the right anterior cerebral artery opacify into the capillary and venous phases. Cross-filling via the anterior communicating artery of the left anterior cerebral artery A2 segment and distally is noted. Also seen is a small infundibulum at the origin of the right posterior communicating artery. The right subclavian arteriogram demonstrates the right vertebral artery origin to be widely patent. The vessel is seen to ascend unimpeded to the cranial skull base where it supplies the ipsilateral right posterior-inferior cerebellar artery. There is no opacification distal to this. The left common carotid arteriogram demonstrates the left external carotid artery origin to be narrowed by 50%. Its branches are normally opacified otherwise. The left internal carotid artery at the bulb demonstrates wide patency. There is a U-shaped tortuosity of the proximal 1/3 of the left internal carotid artery. More distally, the vessel is seen to opacify normally to the cranial skull base. The petrous segment is unremarkable. There is mild fusiform dilatation of the cavernous segment. The distal cavernous segment and the supraclinoid segments are widely patent. A left posterior communicating artery is seen opacifying the left posterior cerebral artery distribution with retrograde partial opacification of the distal basilar artery and the superior cerebellar arteries. The left middle cerebral artery and the left anterior cerebral artery opacify into the capillary and venous phases. Cross-filling via the anterior communicating artery of the right anterior cerebral artery A2 segment is noted. The left vertebral artery origin demonstrates wide patency. The vessel is seen to opacify normally to the cranial skull base. Wide patency is seen of the left posterior-inferior cerebellar artery. Distal to this there is severe pre occlusive stenosis of the vertebrobasilar junction just proximal to the basilar artery.  Distal to this there is another area of severe pre occlusive stenosis of the mid basilar artery just distal to the origin of the anterior-inferior cerebellar arteries. Distal to this the distal basilar artery, the right posterior cerebral artery opacify into the capillary and venous phases. 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 The diagnostic JB 1 catheter in  the distal left subclavian artery was exchanged over a 0.035 inch 300 cm [place] Holster exchange guidewire for a 6 French 80 cm Cook shuttle sheath using biplane roadmap technique and constant fluoroscopic guidance. Good aspiration obtained from the hub of the [name] shuttle sheath. A gentle contrast injection demonstrated no evidence of spasms, dissections or of intraluminal filling defects. Over a 0.035 inch Roadrunner guidewire, a 115 cm 5 [place] Navien guide catheter was advanced to the origin of the left vertebral artery. The guidewire was then advanced distally into the left vertebral artery to the cranial skull base followed by the Navien guide catheter. At this time the [facility] shuttle sheath was also advanced into the proximal 1/3 of the left vertebral artery. The guidewire was removed from the 5 [place] Navien guide catheter. Good aspiration obtained from the hub of the Navien guide catheter. Gentle contrast injection demonstrated no evidence of spasms, dissections or of intraluminal filling defects. A control arteriogram was then performed from the 5 [place] Navien guide catheter in the left vertebrobasilar junction proximal to the origin of the left posterior-inferior cerebellar artery. This confirmed the areas of severe stenosis as described earlier. Measurements were then performed of the basilar artery distally and also in the mid segment proximal to the anterior-inferior cerebellar artery, and also proximal to the proximal area of severe stenosis.  It was decided to primarily revascularize both these areas with a single stent. To this effect, over a 0.014 inch Softip Synchro micro guidewire, an 021 Trevo ProVue microcatheter was advanced to the distal tip of the Navien guide catheter in the left vertebrobasilar junction. With the micro guidewire leading with a J-tip configuration to avoid dissections or inducing spasm, manipulating the guidewire was performed with a torque device across the two areas of severe stenosis and into the right posterior cerebral artery P1 P2 segment. This was then followed by the microcatheter. The guidewire was removed. Good aspiration obtained from the hub of the 021 microcatheter which was now in the right posterior cerebral artery P1 segment. A gentle control arteriogram demonstrated good antegrade flow distally. This was then connected to continuous heparinized saline infusion. It was decided to use a [employer] vascular reconstruction device measuring 4 mm x 39 mm. This was then prepped and purged with heparinized saline infusion. In a coaxial manner and with constant heparinized saline infusion this was advanced to the distal end of the microcatheter. The O ring on the delivery microcatheter was then loosened. With slight forward gentle traction with the right hand on the delivery micro guidewire, with left hand the delivery microcatheter was unsheathing the distal portion of the device in the distal basilar artery followed by the proximal portion which was delivered proximal to the proximal severe focal area of stenosis. Under constant fluoroscopic guidance, the delivery microcatheter and the guidewire were retrieved and removed. Control arteriograms were then performed at 15, 30 and 60 minutes post deployment of the endovascular stent. These continued to demonstrate excellent flow through the stented segments of the distal basilar artery and the proximal basilar artery/left vertebrobasilar junction. The distal  portion was noted to be patent by 90%. The proximal stented segment appeared to be patent by 30-40% with good antegrade flow. During this time, multiple attempts were made to advance a Gateway balloon microcatheter over a 0.014 inch micro guidewire in order to angioplasty more proximal side. However, each time there was movement of the stent noted. Because of this, it was decided to leave the proximal lesion without angioplasty  at 30-40% patency. Excellent perfusion was maintained in the left vertebrobasilar junction. No other areas of occlusions were seen. The superior cerebellar arteries and the anterior-inferior cerebellar arteries appeared widely patent. The 5 [place] Navien guide catheter and the [employer] sheath were then retrieved and removed. The 55 cm 8 French neurovascular sheath were left in situ and connected to continuous heparinized saline infusion. Because the patient had received IV tPA it was decided to leave the neurovascular sheath. The right groin was soft without evidence of a hemorrhage. Throughout the procedure, the patient's blood pressure and neurological status remained stable. No angiographic evidence of extravasation or of intraluminal filling defects or mass-effect was noted. A Dyna CT of the head performed on the table revealed no evidence of hemorrhage, mass effect or of intraventricular blood or subarachnoid hemorrhage. The patient was then transported to the neuro ICU intubated for further medical and stroke management. Prior to the placement of the stent to treat the two areas of severe stenosis, the patient was loaded with 180 mg of Brilinta, and 85 mg of aspirin via an orogastric tube. Also during procedure, the patient received a total of 4.5 mg of super selective intracranial intra-arterial Integrilin in order to overcome the possibly of intra stent thrombus formation. IMPRESSION: Status post endovascular revascularization of symptomatic severe tandem stenosis of the left  vertebrobasilar junction/basilar artery stenosis, and of the mid basilar artery with placement of a neurovascular stent with patency of 90% distally, and 30-40% proximally. 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 PLAN: Patient transferred to the ICU for further medical management. Electronically Signed   By: Luanne Bras M.D.   On: 11/19/2017 15:04    Labs:  CBC: Recent Labs    11/25/17 0403 11/26/17 0631 11/28/17 0611 11/29/17 0500  WBC 10.0 8.2 8.8 9.3  HGB 9.9* 9.6* 10.1* 9.4*  HCT 32.6* 31.1* 31.7* 30.6*  PLT 226 234 252 229    COAGS: Recent Labs    03/26/17 1605 11/18/17 0928 11/21/17 2311 11/24/17 0241  INR 1.0 0.91 1.12 1.20  APTT 31 31 34  --     BMP: Recent Labs    11/26/17 0631 11/27/17 0500 11/28/17 0611 11/29/17 0500  NA 137 136 135 134*  K 4.0 3.9 3.8 4.8  CL 104 100 99 103  CO2 27 28 29 28   GLUCOSE 236* 278* 281* 242*  BUN 17 19 19 21   CALCIUM 9.1 9.4 9.3 8.6*  CREATININE 0.55* 0.67 0.57* 0.57*  GFRNONAA >60 >60 >60 >60  GFRAA >60 >60 >60 >60    LIVER FUNCTION TESTS: Recent Labs    04/06/17 0905 08/09/17 1000 11/18/17 0928 11/25/17 0403  BILITOT 0.9 0.7 1.0 0.6  AST 15 20 24  14*  ALT 18 25 34 15  ALKPHOS 73 50 58 43  PROT 7.0 7.1 7.2 5.5*  ALBUMIN 3.9 3.9 3.5 2.0*    TUMOR MARKERS: No results for input(s): AFPTM, CEA, CA199, CHROMGRNA in the last 8760 hours.  Assessment and Plan:  Lymphadenopathy. Plan for image-guided cervical lymph node biopsy today with Dr. Pascal Lux. Patient is NPO, tube feedings have been held. Denies fever and WBCs WNL. Heparin has been held, but he is on Brilinta and Aspirin s/p CVA and stent placement 11/19/2017- ok per Dr. Pascal Lux to proceed. INR 1.20 seconds 11/24/2017. Consent signed by patient's girlfriend as patient is intubated.  Risks and  benefits  discussed with the patient including, but not limited to bleeding, infection, damage to adjacent structures or low yield requiring additional tests. All of the patient's questions were answered, patient is agreeable to proceed. Consent signed and in chart.   Thank you for this interesting consult.  I greatly enjoyed meeting [name] [name] and look forward to participating in their care.  A copy of this report was sent to the requesting provider on this date.  Electronically Signed: Earley Abide, PA-C 11/29/2017, 9:15 AM   I spent a total of 20 Minutes in face to face in clinical consultation, greater than 50% of which was counseling/coordinating care for lymphadenopathy.

## 2017-11-29 NOTE — Progress Notes (Signed)
Inpatient Diabetes Program Recommendations  AACE/ADA: New Consensus Statement on Inpatient Glycemic Control (2015)  Target Ranges:  Prepandial:   less than 140 mg/dL      Peak postprandial:   less than 180 mg/dL (1-2 hours)      Critically ill patients:  140 - 180 mg/dL   Results for JARMAL, LEWELLING (MRN [id number]) as of 11/29/2017 09:11  Ref. Range 11/28/2017 07:50 11/28/2017 12:14 11/28/2017 16:09 11/28/2017 19:41 11/28/2017 23:36 11/29/2017 03:53 11/29/2017 07:52  Glucose-Capillary Latest Ref Range: 70 - 99 mg/dL 267 (H) 256 (H) 228 (H) 223 (H) 256 (H) 196 (H) 248 (H)   Review of Glycemic Control  Diabetes history: DM 2 Outpatient Diabetes medications: Victoza 1.2 mg Daily, Metformin 1000 mg BID Current orders for Inpatient glycemic control: Lantus 45 units, Novolog 0-20 units Q4 hours  Inpatient Diabetes Program Recommendations:    Glucose trends consistently in the 200's. Consider starting Novolog 6 units Q4 hours in addition to correction scale for Tube Feed Coverage.  Thanks,  Tama Headings RN, MSN, BC-ADM, [facility] Inpatient Diabetes Coordinator Team Pager [phone] (8a-5p)

## 2017-11-29 NOTE — Progress Notes (Signed)
STROKE TEAM PROGRESS NOTE   HISTORY [name] [name] is an 62 y.o. male with HTN, DM2, HLD, afib ( was on eliquis) hemoptysis who presented to [facility] ED as a code stroke with left side weakness and slurred speech.  Per patient and girlfriend he woke up this morning and he felt normal. He walked to the door at 0600 to wave bye bye to his girlfriend. About 830 he called his girlfriend and was having slurred speech. She told him to call EMS. Patient states that it was about 0800 when he started " feeling funny". Patient has a history of coughing up blood in which he states he was removed from his eliquis 3 weeks ago. He reports that his last episode of coughing up blood was a few days ago. Denies any CP, SOB  In the ed:BG: 381, BP: was  161/110 Head CT was obtained and no acute intracranial abnormality. ASPECTS 10. Due to patient having hemoptysis pulmonology was consulted who agreed to intubate and bronch the patient at bedside before TPA decision was made. No LVO seen, not an IR candidate.  No previous history of stroke found during chart review. 04/14/2017 patient had a f/u visit for peripheral angiography and at that time he was on Eliquis and Plavix.  Date last known well: Date: 11/18/2017 Time last known well: Time: 08:00 tPA Given: Yes Modified Rankin: Rankin Score=0   SUBJECTIVE (INTERVAL HISTORY) Girlfriend at the bedside. Plans for LN bx today. Pt remains intubated without movement. But more alert and follows commands by moving his eyes   OBJECTIVE Temp:  [98.9 F (37.2 C)-99.5 F (37.5 C)] 99.4 F (37.4 C) (07/08 0800) Pulse Rate:  [80-99] 85 (07/08 0800) Cardiac Rhythm: Atrial fibrillation (07/08 0800) Resp:  [0-28] 23 (07/08 0800) BP: (102-139)/(59-89) 106/71 (07/08 0800) SpO2:  [92 %-97 %] 93 % (07/08 0800) FiO2 (%):  [40 %-50 %] 50 % (07/08 0734) Weight:  [158.1 kg (348 lb 8.8 oz)] 158.1 kg (348 lb 8.8 oz) (07/08 0500)  CBC:  Recent Labs  Lab 11/28/17 0611 11/29/17 0500   WBC 8.8 9.3  NEUTROABS 6.6 6.8  HGB 10.1* 9.4*  HCT 31.7* 30.6*  MCV 88.3 90.3  PLT 252 295    Basic Metabolic Panel:  Recent Labs  Lab 11/28/17 0611 11/29/17 0500  NA 135 134*  K 3.8 4.8  CL 99 103  CO2 29 28  GLUCOSE 281* 242*  BUN 19 21  CREATININE 0.57* 0.57*  CALCIUM 9.3 8.6*  MG 1.8 1.9    Lipid Panel:     Component Value Date/Time   CHOL 110 11/19/2017 0437   CHOL 119 08/09/2017 1000   TRIG 340 (H) 11/19/2017 0437   HDL 19 (L) 11/19/2017 0437   HDL 29 (L) 08/09/2017 1000   CHOLHDL 5.8 11/19/2017 0437   VLDL 68 (H) 11/19/2017 0437   LDLCALC 23 11/19/2017 0437   LDLCALC 30 08/09/2017 1000   HgbA1c:  Lab Results  Component Value Date   HGBA1C 9.1 (H) 11/19/2017   Urine Drug Screen: No results found for: LABOPIA, COCAINSCRNUR, LABBENZ, AMPHETMU, THCU, LABBARB  Alcohol Level No results found for: ETH  IMAGING Ct Angio Head W Or Wo Contrast  Ct Angio Neck W Or Wo Contrast 11/18/2017 Since the study of earlier today, there are only 2 changes. There is new consolidation and volume loss in the right upper lobe. 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 Flow is seen distal to that however, presumably indicating patent posterior communicating arteries.  Ct Angio Head W Or Wo Contrast Ct Angio Neck W Or Wo Contrast 11/18/2017 1. Severe intracranial and extracranial atherosclerotic disease. No emergent large vessel occlusion  2. Severe stenosis right carotid bifurcation due to calcified plaque, estimated 90% diameter stenosis. No significant left carotid stenosis. Severe stenosis cavernous carotid bilaterally.  3. Both vertebral arteries are patent in the neck with severe calcific stenosis V4 segment bilaterally.  4. 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 Small calcified lymph nodes left neck suspicious for metastatic disease  5. Small lung nodules on the right, possible metastatic disease. Recommend chest CT for further evaluation.    Ct Head Wo Contrast 11/19/2017 1. Expected evolution of brainstem infarct without hemorrhage.  2. No new infarcts.  3. Left vertebral artery and basilar artery stent.  4. Acute on chronic sinus disease may be related to intubation.   Ct Chest W Contrast 11/18/2017 Right upper lobe airspace opacity is noted with air bronchograms concerning for pneumonia or possibly atelectasis. Stable mediastinal adenopathy is noted compared to prior exam. Endotracheal and nasogastric tubes are in grossly good position. Coronary artery calcifications are noted suggesting coronary artery disease. Possible 2.8 cm left thyroid nodule. Thyroid ultrasound is recommended for further evaluation. Aortic Atherosclerosis (ICD10-I70.0).   Mr Brain Wo Contrast 11/19/2017 1. 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 No associated hemorrhage or significant mass effect.  2. Additional punctate acute/early subacute infarctions within left lateral cerebellum and right parietal cortex.  3. Minimal chronic microvascular ischemic changes of the brain and mild parenchymal volume loss.   Ct Head Code Stroke Wo Contrast 11/18/2017 1. No acute intracranial abnormality.  Atherosclerotic calcification  2. ASPECTS is 10   Cerebral Angiogram / Stent  11/18/2017 1. 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 Occluded non dominant RT VBJ. 2. S/P endovascular revascularization of the distal basilar artery  To 90 % patency , and of 30 to 40 % of the LT VBJ with stent placement. Dominant  Lt Pcom.  Transthoracic Echocardiogram - Left ventricle: The cavity size was moderately dilated. Wall thickness was increased in a pattern of severe LVH. Systolic function was normal. The estimated ejection fraction was in the range of 55% to 60%. Wall motion was normal; there were no regional wall  motion abnormalities. The study is not technically sufficient to allow evaluation of LV diastolic function. - Aortic valve: Trileaflet. Sclerosis without stenosis. There was no regurgitation. Valve area (Vmax): 2.88 cm^2. - Aorta: Aortic root ML diameter: 41.95 mm (ED). - Aortic root: The aortic root is mildly dilated. - Mitral valve: Calcified annulus. There was trivial regurgitation. - Left atrium: The atrium was mildly dilated. - Right ventricle: The cavity size was mildly dilated. Mildly reduced systolic function. - Inferior vena cava: The vessel was normal in size. The respirophasic diameter changes were in the normal range (>= 50%), consistent with normal central venous pressure. Impressions:  Compared to a prior study in 02/2017, there have been no significant changes.  Ct Head Wo Contrast 11/24/2017 1. Continued normal expected interval evolution of brainstem infarct. No evidence for hemorrhagic transformation or other complication.  2. No other new intracranial abnormality.  3. Moderate paranasal sinus disease with bilateral mastoid effusions, likely related intubation.   Dg Chest Port 1 View 11/25/2017 Mild vascular congestion. Tubes and lines as described.  Ct Soft Tissue Neck W Contrast 11/26/2017 Aside from the fact that the patient shows endotracheal intubation and the presence of an orogastric tube, and aside from development of bilateral layering pleural effusions with dependent atelectasis, the findings are unchanged since the previous studies. Irregular mass arising from the inferior left thyroid lobe, measuring approximately 5 x 3.4 x 5.5 cm, with invasion of the surrounding tissues worrisome for thyroid carcinoma. Partial encasement of the trachea. Broad surface along the esophagus. Small but likely pathologic nodes in the lower neck and superior mediastinum, some with calcification, probably involved by metastatic disease.   Dg Chest Port 1 View 11/29/2017 Tube and catheter  positions as described without pneumothorax. There is pulmonary vascular congestion with interstitial edema and small pleural effusions. Suspect a degree of underlying congestive heart failure. No airspace consolidation.     PHYSICAL EXAM General - morbid obesity,middle aged Caucasian male, intubated on sedation.  Ophthalmologic - fundi not visualized due to noncooperation.  Cardiovascular - irregularly irregular heart rate and rhythm, afib RVR.  Neuro - intubated on sedation,  Eyes closed, not following simple commands. Eyes open but not blinking to visual threat, pinpoint pupils, not reactive to light, eye dysconjugate with right eye inward position, right eye mid position, able to voluntarily move the eyes in the vertical plane and partially to the left past midline but not able to look to the right. Oblique gag.and has respiratory effort above ventilator setting. Not blinking to visual threat bilaterally. Facial symmetry not able to test due to ET tube, not able to move tongue or mouth on command. No movement on all extremities, DTR diminished and no babinski. Sensation, coordination and gait not tested.   ASSESSMENT/PLAN Mr. Guhan Bruington is a 62 y.o. male with PMH of DM, hemoptysis, OSA, HTN, HLD, CAD, afib off Eliquis due to hemoptysis presenting with slurred speech, right sided weakness. Was intubated for hemoptysis prevention while giving tPA. Symptoms worsened and imaging confirmed BA occlusion. S/p IR with revascularization of BA as well as left VA s/p stenting.  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    MRI head - large acute/early subacute infarction within the central pons involving both sides, greater on the right.  CTA H&N - severe vascular athero including BA stenosis, b/l VA stenosis, right ICA proximal 90% stenosis, b/l cavernous ICA stenosis R>L.  Repeat CTA H/N - new distal BA occlusion  DSA - revascularization of the distal basilar artery to 90 % patency , and of 30 to 40 % of the LT VBJ with stent placement.  CT head 11/24/17 showed normal evolution of brainstem infarct, no acute change  2D Echo - EF 55-60%  LDL - 23  HgbA1c - 9.1  VTE prophylaxis - heparin subq  Eliquis (recently stopped) and plavix prior to admission, now on aspirin 81 mg daily and Brilinta . Hold off AC for now due to hemoptysis and pending biopsy.   Ongoing aggressive stroke risk factor management  Therapy recommendations:  pending  Disposition:  Pending  Thyroid mass  CTA neck - right large thyroid mass compressing on trachea.   Thyroid mass larger than CT chest in 08/2016  Evidence of tracheal erosion on bronch at this admission  CT neck with contrast showed thyroid mass concerning for carcinoma, encasement of trachea.   Pending thyroid vs. Lymph node biopsy with IR next week  Based on thyroid biopsy, will consider oncology consult  Respiratory failure  CCM on board  Current intubated on sedation  no cuff leak  Fighting with vent when off sedation  Likely need trach if not able to wean off vent  Do not think brilinta is a contraindication for trach  PAF  Currently in afib RVR with respiratory distress  On eliquis in the past but off due to hemoptysis  Hold off at this time due to hemoptysis and upcoming biopsy  On ASA and brilinta  Coreg bid for rate control  Cerebral vascular athero  CTA head and neck showed right ICA proximal 90% stenosis, left ICA proximal athero, b/l cavernous ICA severe athro R>L, BA severe stenosis, b/l V4 severe athero with right V4  occlusion  S/p left V4 stent and BA revascularization  On ASA and brilinta  Hemoptysis, improved  Since 08/2016 as per family  Not sure about etiology  TB ruled out in the past  Possible tracheal erosion from right thyroid mass   CT chest showed scattered long nodules, RUE suspected PNA  CT neck showed thyroid mass concerning for carcinoma, encasement of trachea.   Plan for thyroid vs. Lymph node biopsy next week  CCM on board  Fever   Tmax 102.4->102.5->100.0->98.3->100.8  CXR - progression of bibasilar consolidation -> Mild vascular congestion is noted without focal confluent infiltrate  Off antibiotics  Hypertension  Stable on the low side  Now on coreg bid  Also on HCTZ for limb swelling . Long term BP goal normotensive.  Hyperlipidemia  Lipid lowering medication PTA: Crestor 10 mg daily  LDL 23, goal < 70  On Crestor  Continue statin at discharge  Diabetes  HgbA1c 9.1, goal < 7.0  Uncontrolled  Off insulin drip, CBG fluctuate  On lantus and SSI   CBG monitoring  Other Stroke Risk Factors  Advanced age  Former cigarette smoker - quit, 40 pack year history  Morbid obesity, Body mass index is 50.01 kg/m.    Hx stroke/TIA  Coronary artery disease   PAD with claudication  OSA on CPAP  Other Active Problems  Leukocytosis, resolved WBC 9.3  Anemia 9.4   Hyponatremia 134  Plan lymph node biopsy by interventional radiology today. Pending pathology will need to discuss with patient about his wishes and goals of care as he may not be a good candidate for major surgery, chemotherapy or radiation given his quadriplegia and poor neurological status. Plan to start IV heparin drip after biopsies  Are done.He may also need tracheostomy but surgically this may be challenging given his thyroid mass surrounding his trachea. May need to consult palliative care team prior to making definitive decisions. Long discussion with patient, his girlfriend  as well as Dr. Lamonte Sakai critical care medicine and answered questions. This patient is critically ill and at significant risk of neurological worsening, death and care requires constant monitoring of vital signs, hemodynamics,respiratory and cardiac monitoring, extensive review of multiple databases, frequent neurological assessment, discussion with family, other specialists and medical decision making of high complexity.I have made any additions or clarifications directly to the above note.This critical care time does not reflect procedure time, or teaching time or supervisory time of PA/NP/Med Resident etc but could involve care discussion time.  I spent 40 minutes  of neurocritical care time  in the care of  this patient. Antony Contras, MD Medical Director [place] Pager: [phone] 11/29/2017 2:21 PM    To contact Stroke Continuity provider, please refer to [email or web address] After hours, contact General Neurology

## 2017-11-29 NOTE — Progress Notes (Signed)
Nutrition Follow-up  DOCUMENTATION CODES:   Morbid obesity  INTERVENTION:  Once ok to resume TF via OG tube: Vital High Protein @ 65 ml/h (1560 ml per day)  30 ml Prostat TID  Provides:  1860 kcals, 181 gm protein, 1310 ml free water daily.  Recommend insulin coverage as listed in Diabetes Coordinator's notes  NUTRITION DIAGNOSIS:   Inadequate oral intake related to inability to eat as evidenced by NPO status; ongoing  GOAL:   Provide needs based on ASPEN/SCCM guidelines; met  MONITOR:   TF tolerance, Labs, I & O's  ASSESSMENT:   Pt with PMH of vascular dx, CAD, COPD, GERD, HLD, HTN, OSA, afib (off anticoagulation x 3 weeks due to hemoptysis), and DM admitted 6/27 with R MCA stroke s/p tPA and revascularization x 2 on 6/27. Pt noted to have a large thyroid mass and and multiple lung nodules suspicious for metastatic thyroid cancer. Also noted coolness of R distal leg, vascular has deferred interventions for now.   Pt discussed during ICU rounds and with RN.  Per RN MD decreased TF due to elevated blood sugars and also increased lantus. No coverage for TF added. Diabetes Coordinator has left recommendations for this, discussed with RN.  Decreased to 45 ml/hr 7/6 and down to 30 ml/hr on 7/7  TF held for lymph node biopsy in IR. Per CCM results will help to determine plan of care.   Patient is currently intubated on ventilator support MV: 17.2 L/min Temp (24hrs), Avg:99.3 F (37.4 C), Min:98.9 F (37.2 C), Max:99.5 F (37.5 C)  Medications reviewed and include: colace, lasix, SSI, 45 units lantus daily Labs reviewed: CBG (last 3)  Recent Labs    11/29/17 0353 11/29/17 0752 11/29/17 1128  GLUCAP 196* 248* 211*     Pt is 14 L positive; weight up 36 lb from admission weight of 312 lb/141.6 kg UOP: 1860 ml   Diet Order:   Diet Order           Diet NPO time specified  Diet effective now          EDUCATION NEEDS:   No education needs have been identified at  this time  Skin:  Skin Assessment: Reviewed RN Assessment  Last BM:  7/6  Height:   Ht Readings from Last 1 Encounters:  11/18/17 5' 10"  (1.778 m)    Weight:   Wt Readings from Last 1 Encounters:  11/29/17 (!) 348 lb 8.8 oz (158.1 kg)    Ideal Body Weight:  75.4 kg  BMI:  Body mass index is 50.01 kg/m.  Estimated Nutritional Needs:   Kcal:  7340-3709  Protein:  151-188 grams  Fluid:  2 L/day  Maylon Peppers RD, LDN, CNSC [phone] Pager [phone] After Hours Pager

## 2017-11-29 NOTE — Progress Notes (Signed)
PULMONARY / CRITICAL CARE MEDICINE   Name: [name] [name] MRN: [id number] DOB: [birth year]    ADMISSION DATE:  11/18/2017 CONSULTATION DATE: 11/18/2017  REFERRING MD: Emergency department  CHIEF COMPLAINT: Stroke  HISTORY OF PRESENT ILLNESS:    [name]  is a 62 year old man with atrial fibrillation, formally on anticoagulation was stopped due to recent hemoptysis.  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  He presented with an acute stroke 6/27, was intubated and potential tracheal erosion site covered with cuff balloon to facilitate TPA.  Unfortunately during the hospitalization is also experienced a second stroke and underwent mechanical thrombectomy, stent placement 7/1.  SUBJECTIVE:  Remains mechanically ventilated Planning for nodal biopsy with interventional radiology 7/8  VITAL SIGNS: BP 113/73   Pulse 93   Temp 99.4 F (37.4 C) (Axillary)   Resp (!) 25   Ht 5\' 10"  (1.778 m)   Wt (!) 158.1 kg (348 lb 8.8 oz)   SpO2 93%   BMI 50.01 kg/m   HEMODYNAMICS:    VENTILATOR SETTINGS: Vent Mode: PCV FiO2 (%):  [40 %-50 %] 50 % Set Rate:  [20 bmp] 20 bmp PEEP:  [12 cmH20] 12 cmH20 Plateau Pressure:  [24 cmH20-28 cmH20] 25 cmH20  INTAKE / OUTPUT: I/O last 3 completed shifts: In: 1940.3 [I.V.:1595.3; NG/GT:345] Out: 3885 [Urine:3885]  PHYSICAL EXAMINATION: General: Obese man, critically ill, ventilated       Neuro: Opens eyes to voice, appears to track, does not follow any commands or move any extremities HEENT: Short thick neck, endotracheal tube in place Cardiovascular: Irregularly irregular, no murmur Lungs: Comfortable respiratory pattern on pressure control ventilation, PEEP 12 Abdomen: Obese, nontender, positive bowel sounds  Recent Labs  Lab 11/27/17 0500 11/28/17 0611 11/29/17 0500  NA 136 135 134*  K 3.9 3.8 4.8  CL 100 99 103  CO2 28 29 28   BUN 19 19 21   CREATININE 0.67 0.57* 0.57*  GLUCOSE 278*  281* 242*    Electrolytes Recent Labs  Lab 11/27/17 0500 11/28/17 0611 11/29/17 0500  CALCIUM 9.4 9.3 8.6*  MG  --  1.8 1.9    CBC Recent Labs  Lab 11/26/17 0631 11/28/17 0611 11/29/17 0500  WBC 8.2 8.8 9.3  HGB 9.6* 10.1* 9.4*  HCT 31.1* 31.7* 30.6*  PLT 234 252 229    Coag's Recent Labs  Lab 11/24/17 0241  INR 1.20    Sepsis Markers Recent Labs  Lab 11/23/17 0954 11/24/17 0241 11/25/17 0403  PROCALCITON 0.12 0.16 0.13    ABG Recent Labs  Lab 11/27/17 0400 11/28/17 0425 11/29/17 0438  PHART 7.458* 7.460* 7.445  PCO2ART 37.0 40.0 39.1  PO2ART 78.0* 67.1* 71.0*    Liver Enzymes Recent Labs  Lab 11/25/17 0403  AST 14*  ALT 15  ALKPHOS 43  BILITOT 0.6  ALBUMIN 2.0*    Cardiac Enzymes No results for input(s): TROPONINI, PROBNP in the last 168 hours.  Glucose Recent Labs  Lab 11/28/17 1214 11/28/17 1609 11/28/17 1941 11/28/17 2336 11/29/17 0353 11/29/17 0752  GLUCAP 256* 228* 223* 256* 196* 248*    Imaging Dg Chest Port 1 View  Result Date: 11/29/2017 CLINICAL DATA:  Hypoxia EXAM: PORTABLE CHEST 1 VIEW COMPARISON:  November 27, 2017 FINDINGS: Endotracheal tube tip is 6.0 cm above the carina. Nasogastric tube tip and side port are below the diaphragm. Central catheter tip is in the superior vena cava. No pneumothorax. There is moderate interstitial pulmonary edema with small pleural effusions. There is cardiomegaly with  pulmonary venous hypertension. There is no appreciable airspace consolidation. No evident adenopathy. No bone lesions. IMPRESSION: Tube and catheter positions as described without pneumothorax. There is pulmonary vascular congestion with interstitial edema and small pleural effusions. Suspect a degree of underlying congestive heart failure. No airspace consolidation. Electronically Signed   By: Lowella Grip III M.D.   On: 11/29/2017 07:06        CULTURES: 11/18/2017 sputum>> normal flora  ANTIBIOTICS: 11/18/2017  Unasyn>> off  SIGNIFICANT EVENTS: 11/18/2017 intubated  LINES/TUBES: 11/18/2017 endotracheal tube>>  DISCUSSION:  ASSESSMENT / PLAN:  PULMONARY A: Vent dependent respiratory failure, with high PEEP requirement Possible airway compromise due to paratracheal/thyroid mass Pulmonary nodules, question metastatic Right upper lobe infiltrate at presentation, presumed pneumonia, culture negative COPD Currently on pressure control ventilation.  Will ABG, consider transition back to volume control when able to tolerate Unclear when he will be an extubation candidate for multiple reasons including possible airway compromise with paratracheal mass, high PEEP and FiO2 needs.  Appreciate IR assistance.  The tissue diagnosis will influence overall prognosis and therefore are acute plans and decision making regarding residence of care  Continue to wean PEEP as he can tolerate  CARDIOVASCULAR A:  History of  Paroxysmal atrial fibrillation, currently off anticoagulation for recent hemoptysis of 3 weeks duration     Coronary artery disease.   Peripheral vascular disease Continue carvedilol as ordered Push diuresis as blood pressure and renal function can tolerate, remains 14 L positive Aspirin 81 mg, Brilinta as ordered Crestor as ordered Likely need to start systemic anticoagulation after biopsy with interventional radiology  RENAL  A:   No acute renal issues P:   Follow BMP, urine output with diuresis   Thyroid Mass with possible associated airway compromise Appreciate interventional radiology assistance.  As above, tissue diagnosis will influence overall goals for care and also strategy for care  GASTROINTESTINAL A:   History of GERD Nutrition P:   PPI as ordered Tube feeding  HEMATOLOGIC A:   Chronic anticoagulation for atrial fibrillation Off anticoagulation for 3 weeks due to hemoptysis Appears to be tolerating aspirin and Brilinta, no further hemoptysis noted Timing for  reinitiation of systemic anticoagulation in question, likely heparin infusion  INFECTIOUS A:   Observed aspiration, right upper lobe pneumonia P:   Following observation off antibiotics currently  ENDOCRINE Diabetes mellitus P:   Sliding scale insulin as ordered  PAD Patient had a cool right foot which resolved with removing his right femoral sheath.    NEUROLOGIC A:   Multiple strokes, initially received TPA and then underwent basilar artery thrombectomy and stent placement Brilinta, aspirin Appreciate neurology management   FAMILY  Plans discussed with family by Dr. Pearline Cables.  None present on 7/8  Independent critical care time 32 minutes  Baltazar Apo, MD, PhD 11/29/2017, 11:51 AM [facility] [phone] or if no answer [phone]

## 2017-11-29 NOTE — Procedures (Signed)
Pre Procedure Dx: Right Cervical LAN Post Procedural Dx: Same  Technically successful US guided biopsy of dominant right cervical LN biopsy.   EBL: None  No immediate complications.   Ronny Bacon, MD Pager #: [phone]

## 2017-11-30 ENCOUNTER — Inpatient Hospital Stay (HOSPITAL_COMMUNITY): Payer: 59

## 2017-11-30 LAB — GLUCOSE, CAPILLARY
GLUCOSE-CAPILLARY: 216 mg/dL — AB (ref 70–99)
GLUCOSE-CAPILLARY: 288 mg/dL — AB (ref 70–99)
GLUCOSE-CAPILLARY: 255 mg/dL — AB (ref 70–99)
GLUCOSE-CAPILLARY: 223 mg/dL — AB (ref 70–99)
GLUCOSE-CAPILLARY: 211 mg/dL — AB (ref 70–99)
Glucose-Capillary: 218 mg/dL — ABNORMAL HIGH (ref 70–99)

## 2017-11-30 LAB — CBC
HEMATOCRIT: 30.9 % — AB (ref 39.0–52.0)
HEMOGLOBIN: 9.4 g/dL — AB (ref 13.0–17.0)
MCH: 27.6 pg (ref 26.0–34.0)
MCHC: 30.4 g/dL (ref 30.0–36.0)
MCV: 90.6 fL (ref 78.0–100.0)
Platelets: 256 10*3/uL (ref 150–400)
RBC: 3.41 MIL/uL — AB (ref 4.22–5.81)
RDW: 14.1 % (ref 11.5–15.5)
WBC: 9.3 10*3/uL (ref 4.0–10.5)

## 2017-11-30 LAB — BASIC METABOLIC PANEL
ANION GAP: 6 (ref 5–15)
BUN: 19 mg/dL (ref 8–23)
CO2: 29 mmol/L (ref 22–32)
Calcium: 9.1 mg/dL (ref 8.9–10.3)
Chloride: 100 mmol/L (ref 98–111)
Creatinine, Ser: 0.55 mg/dL — ABNORMAL LOW (ref 0.61–1.24)
GLUCOSE: 233 mg/dL — AB (ref 70–99)
POTASSIUM: 3.5 mmol/L (ref 3.5–5.1)
Sodium: 135 mmol/L (ref 135–145)

## 2017-11-30 LAB — MAGNESIUM: MAGNESIUM: 2.1 mg/dL (ref 1.7–2.4)

## 2017-11-30 LAB — HEPARIN LEVEL (UNFRACTIONATED)
HEPARIN UNFRACTIONATED: 1.34 [IU]/mL — AB (ref 0.30–0.70)
Heparin Unfractionated: 0.15 IU/mL — ABNORMAL LOW (ref 0.30–0.70)

## 2017-11-30 MED ORDER — HEPARIN (PORCINE) IN NACL 100-0.45 UNIT/ML-% IJ SOLN
2400.0000 [IU]/h | INTRAMUSCULAR | Status: AC
  Administered 2017-11-30: 1500 [IU]/h via INTRAVENOUS
  Administered 2017-12-01: 2200 [IU]/h via INTRAVENOUS
  Administered 2017-12-01: 1700 [IU]/h via INTRAVENOUS
  Administered 2017-12-02: 3100 [IU]/h via INTRAVENOUS
  Administered 2017-12-02: 2500 [IU]/h via INTRAVENOUS
  Administered 2017-12-02 – 2017-12-04 (×6): 3100 [IU]/h via INTRAVENOUS
  Administered 2017-12-05: 3050 [IU]/h via INTRAVENOUS
  Administered 2017-12-05 (×2): 3100 [IU]/h via INTRAVENOUS
  Administered 2017-12-06: 2450 [IU]/h via INTRAVENOUS
  Administered 2017-12-06: 2700 [IU]/h via INTRAVENOUS
  Administered 2017-12-06: 2600 [IU]/h via INTRAVENOUS
  Administered 2017-12-07: 2300 [IU]/h via INTRAVENOUS
  Administered 2017-12-07: 2350 [IU]/h via INTRAVENOUS
  Administered 2017-12-08: 2300 [IU]/h via INTRAVENOUS
  Administered 2017-12-08 – 2017-12-12 (×7): 2200 [IU]/h via INTRAVENOUS
  Administered 2017-12-12: 2400 [IU]/h via INTRAVENOUS
  Filled 2017-11-30 (×36): qty 250

## 2017-11-30 MED ORDER — INSULIN ASPART 100 UNIT/ML ~~LOC~~ SOLN
6.0000 [IU] | SUBCUTANEOUS | Status: DC
  Administered 2017-11-30 – 2017-12-01 (×6): 6 [IU] via SUBCUTANEOUS

## 2017-11-30 MED ORDER — FUROSEMIDE 10 MG/ML IJ SOLN
40.0000 mg | Freq: Three times a day (TID) | INTRAMUSCULAR | Status: DC
  Administered 2017-11-30 – 2017-12-05 (×15): 40 mg via INTRAVENOUS
  Filled 2017-11-30 (×15): qty 4

## 2017-11-30 NOTE — Progress Notes (Signed)
PULMONARY / CRITICAL CARE MEDICINE   Name: [name] [name] MRN: [id number] DOB: [birth year]    ADMISSION DATE:  11/18/2017 CONSULTATION DATE: 11/18/2017  REFERRING MD: Emergency department  CHIEF COMPLAINT: Stroke  HISTORY OF PRESENT ILLNESS:    [name] [name]  is a 62 year old man with atrial fibrillation, formally on anticoagulation was stopped due to recent hemoptysis.  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  He presented with an acute stroke 6/27, was intubated and potential tracheal erosion site covered with cuff balloon to facilitate TPA.  Unfortunately during the hospitalization is also experienced a second stroke and underwent mechanical thrombectomy, stent placement 7/1.  SUBJECTIVE:  Underwent right cervical lymph node biopsy with interventional radiology on 7/8 Currently on pressure control ventilation, PEEP 12.  Tidal volumes in the mid 600s  VITAL SIGNS: BP 125/69   Pulse 97   Temp 99.4 F (37.4 C) (Axillary)   Resp (!) 26   Ht 5\' 10"  (1.778 m)   Wt (!) 152 kg (335 lb 1.6 oz)   SpO2 95%   BMI 48.08 kg/m   HEMODYNAMICS:    VENTILATOR SETTINGS: Vent Mode: PCV FiO2 (%):  [40 %-50 %] 40 % Set Rate:  [20 bmp] 20 bmp PEEP:  [12 cmH20] 12 cmH20 Plateau Pressure:  [21 cmH20-28 cmH20] 22 cmH20  INTAKE / OUTPUT: I/O last 3 completed shifts: In: 1862.6 [I.V.:1637.6; NG/GT:225] Out: 7893 [Urine:3175]  PHYSICAL EXAMINATION: General: Ill-appearing obese man, ventilated      Neuro: His eyes are open, he does appear to track.  He will blink to questions and appears to understand.  He is not moving extremities HEENT: Endotracheal tube in place, short thick neck, no nodules palpated Cardiovascular: Irregularly irregular, no murmur Lungs: Comfortable respiratory pattern, PEEP 12, remains on pressure control 18, VT ~650 cc Abdomen: Nontender, obese, positive bowel sounds  Recent Labs  Lab 11/28/17 0611 11/29/17 0500  11/30/17 0628  NA 135 134* 135  K 3.8 4.8 3.5  CL 99 103 100  CO2 29 28 29   BUN 19 21 19   CREATININE 0.57* 0.57* 0.55*  GLUCOSE 281* 242* 233*    Electrolytes Recent Labs  Lab 11/28/17 0611 11/29/17 0500 11/30/17 0628  CALCIUM 9.3 8.6* 9.1  MG 1.8 1.9 2.1    CBC Recent Labs  Lab 11/28/17 0611 11/29/17 0500 11/30/17 0510  WBC 8.8 9.3 9.3  HGB 10.1* 9.4* 9.4*  HCT 31.7* 30.6* 30.9*  PLT 252 229 256    Coag's Recent Labs  Lab 11/24/17 0241  INR 1.20    Sepsis Markers Recent Labs  Lab 11/23/17 0954 11/24/17 0241 11/25/17 0403  PROCALCITON 0.12 0.16 0.13    ABG Recent Labs  Lab 11/27/17 0400 11/28/17 0425 11/29/17 0438  PHART 7.458* 7.460* 7.445  PCO2ART 37.0 40.0 39.1  PO2ART 78.0* 67.1* 71.0*    Liver Enzymes Recent Labs  Lab 11/25/17 0403  AST 14*  ALT 15  ALKPHOS 43  BILITOT 0.6  ALBUMIN 2.0*    Cardiac Enzymes No results for input(s): TROPONINI, PROBNP in the last 168 hours.  Glucose Recent Labs  Lab 11/29/17 1128 11/29/17 1511 11/29/17 1945 11/29/17 2345 11/30/17 0358 11/30/17 0746  GLUCAP 211* 185* 200* 209* 216* 288*    Imaging Ir US Guide Bx Asp/drain  Result Date: 11/29/2017 INDICATION: Concern for metastatic thyroid cancer. Please perform ultrasound-guided right cervical lymph node biopsy for tissue diagnostic purposes. EXAM: ULTRASOUND-GUIDED RIGHT CERVICAL LYMPH NODE BIOPSY COMPARISON:  Neck CT - 11/26/2017 MEDICATIONS: None  ANESTHESIA/SEDATION: None COMPLICATIONS: None immediate. TECHNIQUE: Informed written consent was obtained from patient's family after a discussion of the risks, benefits and alternatives to treatment. Questions regarding the procedure were encouraged and answered. Initial ultrasound scanning demonstrated and approximately 1.7 x 1.6 cm enlarged right cervical lymph node correlating with the dominant right cervical lymph node seen on preceding neck CT image 66, series 3. An ultrasound image was saved  for documentation purposes. The procedure was planned. A timeout was performed prior to the initiation of the procedure. The operative was prepped and draped in the usual sterile fashion, and a sterile drape was applied covering the operative field. A timeout was performed prior to the initiation of the procedure. Local anesthesia was provided with 1% lidocaine with epinephrine. Under direct ultrasound guidance, an 18 gauge core needle device was utilized to obtain to obtain 5 core needle biopsies of the indeterminate right cervical lymph node. The samples were placed in saline and submitted to pathology. The needle was removed and hemostasis was achieved with manual compression. Post procedure scan was negative for significant hematoma. A dressing was placed. The patient tolerated the procedure well without immediate postprocedural complication. IMPRESSION: Technically successful ultrasound guided biopsy of dominant indeterminate right cervical lymph node. Electronically Signed   By: Sandi Mariscal M.D.   On: 11/29/2017 15:12   Dg Chest Port 1 View  Result Date: 11/30/2017 CLINICAL DATA:  62 year old male with a history of acute respiratory failure EXAM: PORTABLE CHEST 1 VIEW COMPARISON:  11/29/2017, 11/27/2017 FINDINGS: Cardiomediastinal silhouette unchanged in size and contour. Fullness in the central vasculature. Low lung volumes. Opacities at the bilateral lung bases with blunting of the costophrenic angles and obscuration of the retrocardiac region on the left hemidiaphragm. Interlobular septal thickening. Overall aeration appears slightly improved at the right base. Interval placement of right upper extremity PICC, with the catheter appearing to terminate in the superior vena cava. Unchanged endotracheal tube terminating at the level of the clavicular heads. Gastric tube unchanged terminating out of the field of view. IMPRESSION: Similar appearance of the chest x-ray with mild improvement in aeration at the  right base, with mixed interstitial and airspace disease and likely small pleural effusions. Unchanged endotracheal tube, gastric tube. Interval placement of right upper extremity PICC. Electronically Signed   By: Corrie Mckusick D.O.   On: 11/30/2017 08:42        CULTURES: 11/18/2017 sputum>> normal flora  ANTIBIOTICS: 11/18/2017 Unasyn>> off  SIGNIFICANT EVENTS: 11/18/2017 intubated Right cervical lymph node biopsy 7/8 >>    LINES/TUBES: 11/18/2017 endotracheal tube>>  DISCUSSION:  ASSESSMENT / PLAN:  PULMONARY A: Vent dependent respiratory failure, with high PEEP requirement Possible airway compromise due to paratracheal/thyroid mass Pulmonary nodules, question metastatic Right upper lobe infiltrate at presentation, presumed pneumonia, culture negative COPD Continue current pressure control ventilation.  Adequate tidal volumes and minute ventilation.  Appears more comfortable on this mode. Barriers for extubation include his strokes and apparent locked-in state.  Unclear whether he can protect his airway.  Further he has a paratracheal mass with unclear impact on his airway.  Finally he has high PEEP needs currently.  We will work to try and wean the PEEP as able.  Diuresis may be helpful here. Appreciate IR assistance, status post right cervical node biopsy 7/8.  The tissue diagnosis will influence overall prognosis and therefore our plans regarding continued aggressive care.  CARDIOVASCULAR A:  History of  Paroxysmal atrial fibrillation, currently off anticoagulation for recent hemoptysis of 3 weeks duration  Coronary artery disease.   Peripheral vascular disease Continue carvedilol as ordered Increase Lasix 7/9, follow urine output and hemodynamics, 13 L positive Brilinta, aspirin as ordered Crestor as ordered Restart systemic anticoagulation when okay with neurology, post biopsy  RENAL  A:   No acute renal issues P:   Increase diuresis 7/9 Continue to follow  urine output, BMP   Thyroid Mass with possible associated airway compromise Appreciate Dr. Pascal Lux assistance with interventional radiology.  Await tissue diagnosis.  This will influence further plans.  If aggressive malignancy then unclear that continued aggressive care, including evaluation for tracheostomy would be in the patient's overall best interest  GASTROINTESTINAL A:   History of GERD Nutrition P:   PPI as ordered Tube feeding  HEMATOLOGIC A:   Chronic anticoagulation for atrial fibrillation Off anticoagulation for 3 weeks due to hemoptysis No further hemoptysis on aspirin and Brilinta Systemic anticoagulation per neurology plans  INFECTIOUS A:   Observed aspiration, right upper lobe pneumonia P:   Observing off antibiotics at this time  ENDOCRINE Diabetes mellitus P:   Sliding scale insulin as ordered  PAD Patient had a cool right foot which resolved with removing his right femoral sheath.    NEUROLOGIC A:   Multiple strokes, initially received TPA and then underwent basilar artery thrombectomy and stent placement Appreciate neurology management Brilinta, aspirin   FAMILY  Plans discussed with family by Dr. Pearline Cables.  None present on 7/8 or 7/9  Independent critical care time 33 minutes  Baltazar Apo, MD, PhD 11/30/2017, 8:51 AM [facility] [phone] or if no answer [phone]

## 2017-11-30 NOTE — Progress Notes (Signed)
ANTICOAGULATION CONSULT NOTE - Initial Consult  Pharmacy Consult for Heparin Indication: atrial fibrillation  No Known Allergies  Patient Measurements: Height: 5' 10" (177.8 cm) Weight: (!) 335 lb 1.6 oz (152 kg) IBW/kg (Calculated) : 73 Heparin Dosing Weight: 109.5  Vital Signs: Temp: 99.4 F (37.4 C) (07/09 0800) Temp Source: Axillary (07/09 0800) BP: 113/66 (07/09 0900) Pulse Rate: 107 (07/09 0900)  Labs: Recent Labs    11/28/17 0611 11/29/17 0500 11/30/17 0510 11/30/17 0628  HGB 10.1* 9.4* 9.4*  --   HCT 31.7* 30.6* 30.9*  --   PLT 252 229 256  --   CREATININE 0.57* 0.57*  --  0.55*    Estimated Creatinine Clearance: 143.5 mL/min (A) (by C-G formula based on SCr of 0.55 mg/dL (L)).   Medical History: Past Medical History:  Diagnosis Date  . Collagen vascular disease ([place])   . Coronary artery disease   . GERD (gastroesophageal reflux disease)   . High cholesterol   . Hypertension   . OSA (obstructive sleep apnea)    "dx'd years ago; never have worn mask; mask never ordered" (04/01/2017)  . Pneumonia 2011  . Type II diabetes mellitus ([place]) 07/2016    Medications:  Scheduled:  . aspirin  81 mg Oral Daily   Or  . aspirin  81 mg Per Tube Daily  . carvedilol  6.25 mg Oral BID WC  . chlorhexidine gluconate (MEDLINE KIT)  15 mL Mouth Rinse BID  . Chlorhexidine Gluconate Cloth  6 each Topical Q0600  . clonazepam  1 mg Oral TID  . docusate  100 mg Oral BID  . feeding supplement (PRO-STAT SUGAR FREE 64)  30 mL Per Tube TID  . furosemide  40 mg Intravenous Q8H  . insulin aspart  0-20 Units Subcutaneous Q4H  . insulin glargine  45 Units Subcutaneous QHS  . ipratropium-albuterol  3 mL Nebulization Q4H  . mouth rinse  15 mL Mouth Rinse 10 times per day  . pantoprazole sodium  40 mg Per Tube Daily  . rosuvastatin  20 mg Per Tube q1800  . sodium chloride flush  10-40 mL Intracatheter Q12H  . ticagrelor  90 mg Oral BID   Or  . ticagrelor  90 mg Per Tube BID     Assessment: 62 YO male admitted on 6/27 for stroke and was found to have a mass on his thyroid causing hemoptysis. Patient was on Eliquis PTA for atrial fibrillation but last dose was 3 weeks prior to admission. Patient is currently on Aspirin and Brilinta. Pharmacy has been consulted to dose heparin for further anticoagulation. CBC stable. No s/sx of bleeding documented.  Goal of Therapy: Heparin level 0.3-0.5 units/ml Monitor platelets by anticoagulation protocol: Yes   Plan:  Start heparin infusion at 1500 units/hr  Monitor 6 hour heparin level Monitor daily CBC and s/sx of bleeding  Jackson Latino, PharmD PGY1 Pharmacy Resident Phone [phone] 11/30/2017     10:26 AM

## 2017-11-30 NOTE — Progress Notes (Signed)
Inpatient Diabetes Program Recommendations  AACE/ADA: New Consensus Statement on Inpatient Glycemic Control (2015)  Target Ranges:  Prepandial:   less than 140 mg/dL      Peak postprandial:   less than 180 mg/dL (1-2 hours)      Critically ill patients:  140 - 180 mg/dL   Results for [name] [name], [name] [name] (MRN [id number]) as of 11/29/2017 09:11  Ref. Range 11/28/2017 07:50 11/28/2017 12:14 11/28/2017 16:09 11/28/2017 19:41 11/28/2017 23:36 11/29/2017 03:53 11/29/2017 07:52  Glucose-Capillary Latest Ref Range: 70 - 99 mg/dL 267 (H) 256 (H) 228 (H) 223 (H) 256 (H) 196 (H) 248 (H)  Results for [name] [name], [name] [name] (MRN [id number]) as of 11/30/2017 08:28  Ref. Range 11/29/2017 07:52 11/29/2017 11:28 11/29/2017 15:11 11/29/2017 19:45 11/29/2017 23:45 11/30/2017 03:58 11/30/2017 07:46  Glucose-Capillary Latest Ref Range: 70 - 99 mg/dL 248 (H) 211 (H) 185 (H) 200 (H) 209 (H) 216 (H) 288 (H)   Review of Glycemic Control  Diabetes history: DM 2 Outpatient Diabetes medications: Victoza 1.2 mg Daily, Metformin 1000 mg BID Current orders for Inpatient glycemic control: Lantus 45 units, Novolog 0-20 units Q4 hours  Inpatient Diabetes Program Recommendations:    Glucose trends consistently in the 200's. Consider starting Novolog 6 units Q4 hours in addition to correction scale for Tube Feed Coverage.  Thanks,  Tama Headings RN, MSN, BC-ADM, [facility] Inpatient Diabetes Coordinator Team Pager [phone] (8a-5p)

## 2017-11-30 NOTE — Progress Notes (Signed)
STROKE TEAM PROGRESS NOTE    SUBJECTIVE (INTERVAL HISTORY) Girlfriend at the bedside. He had  LN bx y`day. Pt remains intubated without movement. But more alert and follows commands by moving his eyes   OBJECTIVE Temp:  [99.1 F (37.3 C)-100.6 F (38.1 C)] 99.2 F (37.3 C) (07/09 1200) Pulse Rate:  [82-110] 110 (07/09 1300) Cardiac Rhythm: Atrial fibrillation (07/09 0800) Resp:  [0-27] 25 (07/09 1300) BP: (102-132)/(52-92) 108/55 (07/09 1300) SpO2:  [94 %-100 %] 95 % (07/09 1300) FiO2 (%):  [40 %] 40 % (07/09 1243) Weight:  [327 lb 2.6 oz (148.4 kg)-335 lb 1.6 oz (152 kg)] 327 lb 2.6 oz (148.4 kg) (07/09 1000)  CBC:  Recent Labs  Lab 11/28/17 0611 11/29/17 0500 11/30/17 0510  WBC 8.8 9.3 9.3  NEUTROABS 6.6 6.8  --   HGB 10.1* 9.4* 9.4*  HCT 31.7* 30.6* 30.9*  MCV 88.3 90.3 90.6  PLT 252 229 295    Basic Metabolic Panel:  Recent Labs  Lab 11/29/17 0500 11/30/17 0628  NA 134* 135  K 4.8 3.5  CL 103 100  CO2 28 29  GLUCOSE 242* 233*  BUN 21 19  CREATININE 0.57* 0.55*  CALCIUM 8.6* 9.1  MG 1.9 2.1    Lipid Panel:     Component Value Date/Time   CHOL 110 11/19/2017 0437   CHOL 119 08/09/2017 1000   TRIG 340 (H) 11/19/2017 0437   HDL 19 (L) 11/19/2017 0437   HDL 29 (L) 08/09/2017 1000   CHOLHDL 5.8 11/19/2017 0437   VLDL 68 (H) 11/19/2017 0437   LDLCALC 23 11/19/2017 0437   LDLCALC 30 08/09/2017 1000   HgbA1c:  Lab Results  Component Value Date   HGBA1C 9.1 (H) 11/19/2017   Urine Drug Screen: No results found for: LABOPIA, COCAINSCRNUR, LABBENZ, AMPHETMU, THCU, LABBARB  Alcohol Level No results found for: ETH  IMAGING Ct Angio Head W Or Wo Contrast  Ct Angio Neck W Or Wo Contrast 11/18/2017 Since the study of earlier today, there are only 2 changes. There is new consolidation and volume loss in the right upper lobe. 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 Flow is seen distal to that however, presumably indicating patent posterior  communicating arteries.   Ct Angio Head W Or Wo Contrast Ct Angio Neck W Or Wo Contrast 11/18/2017 1. Severe intracranial and extracranial atherosclerotic disease. No emergent large vessel occlusion  2. Severe stenosis right carotid bifurcation due to calcified plaque, estimated 90% diameter stenosis. No significant left carotid stenosis. Severe stenosis cavernous carotid bilaterally.  3. Both vertebral arteries are patent in the neck with severe calcific stenosis V4 segment bilaterally.  4. 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 Small calcified lymph nodes left neck suspicious for metastatic disease  5. Small lung nodules on the right, possible metastatic disease. Recommend chest CT for further evaluation.   Ct Head Wo Contrast 11/19/2017 1. Expected evolution of brainstem infarct without hemorrhage.  2. No new infarcts.  3. Left vertebral artery and basilar artery stent.  4. Acute on chronic sinus disease may be related to intubation.   Ct Chest W Contrast 11/18/2017 Right upper lobe airspace opacity is noted with air bronchograms concerning for pneumonia or possibly atelectasis. Stable mediastinal adenopathy is noted compared to prior exam. Endotracheal and nasogastric tubes are in grossly good position. Coronary artery calcifications are noted suggesting coronary artery disease. Possible 2.8 cm left thyroid nodule. Thyroid ultrasound is recommended for further evaluation. Aortic Atherosclerosis (  ICD10-I70.0).   Mr Brain Wo Contrast 11/19/2017 1. 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 No associated hemorrhage or significant mass effect.  2. Additional punctate acute/early subacute infarctions within left lateral cerebellum and right parietal cortex.  3. Minimal chronic microvascular ischemic changes of the brain and mild parenchymal volume loss.   Ct Head Code Stroke Wo  Contrast 11/18/2017 1. No acute intracranial abnormality.  Atherosclerotic calcification  2. ASPECTS is 10   Cerebral Angiogram / Stent  11/18/2017 1. 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 Occluded non dominant RT VBJ. 2. S/P endovascular revascularization of the distal basilar artery  To 90 % patency , and of 30 to 40 % of the LT VBJ with stent placement. Dominant  Lt Pcom.  Transthoracic Echocardiogram - Left ventricle: The cavity size was moderately dilated. Wall thickness was increased in a pattern of severe LVH. Systolic function was normal. The estimated ejection fraction was in the range of 55% to 60%. Wall motion was normal; there were no regional wall motion abnormalities. The study is not technically sufficient to allow evaluation of LV diastolic function. - Aortic valve: Trileaflet. Sclerosis without stenosis. There was no regurgitation. Valve area (Vmax): 2.88 cm^2. - Aorta: Aortic root ML diameter: 41.95 mm (ED). - Aortic root: The aortic root is mildly dilated. - Mitral valve: Calcified annulus. There was trivial regurgitation. - Left atrium: The atrium was mildly dilated. - Right ventricle: The cavity size was mildly dilated. Mildly reduced systolic function. - Inferior vena cava: The vessel was normal in size. The respirophasic diameter changes were in the normal range (>= 50%), consistent with normal central venous pressure. Impressions:  Compared to a prior study in 02/2017, there have been no significant changes.  Ct Head Wo Contrast 11/24/2017 1. Continued normal expected interval evolution of brainstem infarct. No evidence for hemorrhagic transformation or other complication.  2. No other new intracranial abnormality.  3. Moderate paranasal sinus disease with bilateral mastoid effusions, likely related intubation.   Dg Chest Port 1 View 11/25/2017 Mild vascular congestion. Tubes and lines as described.    Ct Soft Tissue Neck W Contrast 11/26/2017 Aside from the fact that the patient shows endotracheal intubation and the presence of an orogastric tube, and aside from development of bilateral layering pleural effusions with dependent atelectasis, the findings are unchanged since the previous studies. Irregular mass arising from the inferior left thyroid lobe, measuring approximately 5 x 3.4 x 5.5 cm, with invasion of the surrounding tissues worrisome for thyroid carcinoma. Partial encasement of the trachea. Broad surface along the esophagus. Small but likely pathologic nodes in the lower neck and superior mediastinum, some with calcification, probably involved by metastatic disease.   Dg Chest Port 1 View 11/29/2017 Tube and catheter positions as described without pneumothorax. There is pulmonary vascular congestion with interstitial edema and small pleural effusions. Suspect a degree of underlying congestive heart failure. No airspace consolidation.     PHYSICAL EXAM General - morbid obesity,middle aged Caucasian male, intubated on sedation.  Ophthalmologic - fundi not visualized due to noncooperation.  Cardiovascular - irregularly irregular heart rate and rhythm, afib RVR.  Neuro - intubated on sedation,  Eyes closed, not following simple commands. Eyes open but not blinking to visual threat, pinpoint pupils, not reactive to light, eye dysconjugate with right eye inward position, right eye mid position, able  to voluntarily move the eyes in the vertical plane and partially to the left past midline but not able to look to the right. Has saccadic dysmetria. Weak gag.and has respiratory effort above ventilator setting. Not blinking to visual threat bilaterally. Facial symmetry not able to test due to ET tube, not able to move tongue or mouth on command. No movement on all extremities, DTR diminished and no babinski. Sensation, coordination and gait not tested.   ASSESSMENT/PLAN [name] is a  62 y.o. male with PMH of DM, hemoptysis, OSA, HTN, HLD, CAD, afib off Eliquis due to hemoptysis presenting with slurred speech, right sided weakness. Was intubated for hemoptysis prevention while giving tPA. Symptoms worsened and imaging confirmed BA occlusion. S/p IR with revascularization of BA as well as left VA s/p stenting.  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   MRI head - large acute/early subacute infarction within the central pons involving both sides, greater on the right.  CTA H&N - severe vascular athero including BA stenosis, b/l VA stenosis, right ICA proximal 90% stenosis, b/l cavernous ICA stenosis R>L.  Repeat CTA H/N - new distal BA occlusion  DSA - revascularization of the distal basilar artery to 90 % patency , and of 30 to 40 % of the LT VBJ with stent placement.  CT head 11/24/17 showed normal evolution of brainstem infarct, no acute change  2D Echo - EF 55-60%  LDL - 23  HgbA1c - 9.1  VTE prophylaxis - heparin subq  Eliquis (recently stopped) and plavix prior to admission, now on aspirin 81 mg daily and Brilinta . Hold off AC for now due to hemoptysis and pending biopsy.   Ongoing aggressive stroke risk factor management  Therapy recommendations:  pending  Disposition:  Pending  Thyroid mass  CTA neck - right large thyroid mass compressing on trachea.   Thyroid mass larger than CT chest in 08/2016  Evidence of tracheal erosion on bronch at this admission  CT neck with contrast showed thyroid mass concerning for carcinoma, encasement of trachea.   Pending thyroid vs. Lymph  node biopsy with IR next week  Based on thyroid biopsy, will consider oncology consult  Respiratory failure  CCM on board  Current intubated on sedation  no cuff leak  Fighting with vent when off sedation Likely need trach  As his airways likely to be compromised by his thyroid cancer PAF  Currently in afib RVR with respiratory distress  On eliquis in the past but off due to hemoptysis  Hold off at this time due to hemoptysis and upcoming biopsy  On ASA and brilinta  Coreg bid for rate control  Cerebral vascular athero  CTA head and neck showed right ICA proximal 90% stenosis, left ICA proximal athero, b/l cavernous ICA severe athro R>L, BA severe stenosis, b/l V4 severe athero with right V4 occlusion  S/p left V4 stent and BA revascularization  On ASA and brilinta  Hemoptysis, improved  Since 08/2016 as per family  Not sure about etiology  TB ruled out in the past  Possible tracheal erosion from right thyroid mass   CT chest showed scattered long nodules,  RUE suspected PNA  CT neck showed thyroid mass concerning for carcinoma, encasement of trachea.   Plan for thyroid vs. Lymph node biopsy next week  CCM on board  Fever   Tmax 102.4->102.5->100.0->98.3->100.8  CXR - progression of bibasilar consolidation -> Mild vascular congestion is noted without focal confluent infiltrate  Off antibiotics  Hypertension  Stable on the low side  Now on coreg bid  Also on HCTZ for limb swelling . Long term BP goal normotensive.  Hyperlipidemia  Lipid lowering medication PTA: Crestor 10 mg daily  LDL 23, goal < 70  On Crestor  Continue statin at discharge  Diabetes  HgbA1c 9.1, goal < 7.0  Uncontrolled  Off insulin drip, CBG fluctuate  On lantus and SSI   CBG monitoring  Other Stroke Risk Factors  Advanced age  Former cigarette smoker - quit, 40 pack year history  Morbid obesity, Body mass index is 46.94 kg/m.    Hx  stroke/TIA  Coronary artery disease   PAD with claudication  OSA on CPAP  Other Active Problems  Leukocytosis, resolved WBC 9.3  Anemia 9.4   Hyponatremia 134  Plan  He had lymph node biopsy by interventional radiology under ultrasound guidance. Pending pathology will need to discuss with patient about his wishes and goals of care as he may not be a good candidate for major surgery, chemotherapy or radiation given his quadriplegia and poor neurological status. Plan to start IV heparin drip today.He may also need tracheostomy but surgically this may be challenging given his thyroid mass surrounding his trachea. Long discussion about bedside with the patient and girlfriend. Anticipated arrival of patient's family in the next few days to have family meeting to decide goals of care.May need to consult palliative care team prior to making definitive decisions. Long discussion with patient, his girlfriend as well as Dr. Lamonte Sakai critical care medicine and answered questions. This patient is critically ill and at significant risk of neurological worsening, death and care requires constant monitoring of vital signs, hemodynamics,respiratory and cardiac monitoring, extensive review of multiple databases, frequent neurological assessment, discussion with family, other specialists and medical decision making of high complexity.I have made any additions or clarifications directly to the above note.This critical care time does not reflect procedure time, or teaching time or supervisory time of PA/NP/Med Resident etc but could involve care discussion time.  I spent 40 minutes of neurocritical care time  in the care of  this patient. Antony Contras, MD Medical Director [facility] Pager: [phone] 11/30/2017 1:51 PM    To contact Stroke Continuity provider, please refer to [email or web address] After hours, contact General Neurology

## 2017-11-30 NOTE — Progress Notes (Signed)
ANTICOAGULATION CONSULT NOTE   Pharmacy Consult for Heparin Indication: atrial fibrillation  No Known Allergies  Patient Measurements: Height: _0  (177.8 cm) Weight: (!) 327 lb 2.6 oz (148.4 kg)(Simultaneous filing. User may not have seen previous data.) IBW/kg (Calculated) : 73 Heparin Dosing Weight: 109.5  Vital Signs: Temp: 98.9 F (37.2 C) (07/09 2000) Temp Source: Axillary (07/09 2000) BP: 122/67 (07/09 2002) Pulse Rate: 111 (07/09 2002)  Labs: Recent Labs    11/28/17 0611 11/29/17 0500 11/30/17 0510 11/30/17 0628 11/30/17 1716 11/30/17 1901  HGB 10.1* 9.4* 9.4*  --   --   --   HCT 31.7* 30.6* 30.9*  --   --   --   PLT 252 229 256  --   --   --   HEPARINUNFRC  --   --   --   --  1.34* 0.15*  CREATININE 0.57* 0.57*  --  0.55*  --   --     Estimated Creatinine Clearance: 141.5 mL/min (A) (by C-G formula based on SCr of 0.55 mg/dL (L)).   Medical History: Past Medical History:  Diagnosis Date  . Collagen vascular disease ([place])   . Coronary artery disease   . GERD (gastroesophageal reflux disease)   . High cholesterol   . Hypertension   . OSA (obstructive sleep apnea)    "dx'd years ago; never have worn mask; mask never ordered" (04/01/2017)  . Pneumonia 2011  . Type II diabetes mellitus ([place]) 07/2016    Medications:  Scheduled:  . aspirin  81 mg Oral Daily   Or  . aspirin  81 mg Per Tube Daily  . carvedilol  6.25 mg Oral BID WC  . chlorhexidine gluconate (MEDLINE KIT)  15 mL Mouth Rinse BID  . Chlorhexidine Gluconate Cloth  6 each Topical Q0600  . clonazepam  1 mg Oral TID  . docusate  100 mg Oral BID  . feeding supplement (PRO-STAT SUGAR FREE 64)  30 mL Per Tube TID  . furosemide  40 mg Intravenous Q8H  . insulin aspart  0-20 Units Subcutaneous Q4H  . insulin aspart  6 Units Subcutaneous Q4H  . insulin glargine  45 Units Subcutaneous QHS  . ipratropium-albuterol  3 mL Nebulization Q4H  . mouth rinse  15 mL Mouth Rinse 10 times per day  .  pantoprazole sodium  40 mg Per Tube Daily  . rosuvastatin  20 mg Per Tube q1800  . sodium chloride flush  10-40 mL Intracatheter Q12H  . ticagrelor  90 mg Oral BID   Or  . ticagrelor  90 mg Per Tube BID    Assessment: 62 YO male admitted on 6/27 for stroke and was found to have a mass on his thyroid causing hemoptysis. Patient was on Eliquis PTA for atrial fibrillation but last dose was 3 weeks prior to admission. Patient is currently on Aspirin and Brilinta. Pharmacy has been consulted to dose heparin for further anticoagulation. CBC stable. No s/sx of bleeding documented.  First heparin level elevated due to drawing lab too close to heparin infusion, recheck level was low. No bolus due to recent stroke.   Goal of Therapy: Heparin level 0.3-0.5 units/ml Monitor platelets by anticoagulation protocol: Yes   Plan:  Increase heparin infusion to 1700 units/hr  Monitor daily CBC and s/sx of bleeding  Erin Hearing PharmD., BCPS Clinical Pharmacist 11/30/2017 8:48 PM

## 2017-12-01 ENCOUNTER — Inpatient Hospital Stay (HOSPITAL_COMMUNITY): Payer: 59

## 2017-12-01 LAB — CBC
HEMATOCRIT: 31 % — AB (ref 39.0–52.0)
HEMOGLOBIN: 9.5 g/dL — AB (ref 13.0–17.0)
MCH: 27.5 pg (ref 26.0–34.0)
MCHC: 30.6 g/dL (ref 30.0–36.0)
MCV: 89.6 fL (ref 78.0–100.0)
Platelets: 281 10*3/uL (ref 150–400)
RBC: 3.46 MIL/uL — AB (ref 4.22–5.81)
RDW: 14 % (ref 11.5–15.5)
WBC: 9.9 10*3/uL (ref 4.0–10.5)

## 2017-12-01 LAB — BASIC METABOLIC PANEL
ANION GAP: 7 (ref 5–15)
BUN: 19 mg/dL (ref 8–23)
CHLORIDE: 101 mmol/L (ref 98–111)
CO2: 30 mmol/L (ref 22–32)
CREATININE: 0.51 mg/dL — AB (ref 0.61–1.24)
Calcium: 9.1 mg/dL (ref 8.9–10.3)
GFR calc non Af Amer: >60 mL/min (ref >60)
Glucose, Bld: 205 mg/dL — ABNORMAL HIGH (ref 70–99)
POTASSIUM: 3.7 mmol/L (ref 3.5–5.1)
SODIUM: 138 mmol/L (ref 135–145)

## 2017-12-01 LAB — GLUCOSE, CAPILLARY
GLUCOSE-CAPILLARY: 186 mg/dL — AB (ref 70–99)
GLUCOSE-CAPILLARY: 216 mg/dL — AB (ref 70–99)
GLUCOSE-CAPILLARY: 226 mg/dL — AB (ref 70–99)
Glucose-Capillary: 227 mg/dL — ABNORMAL HIGH (ref 70–99)

## 2017-12-01 LAB — HEPARIN LEVEL (UNFRACTIONATED)
Heparin Unfractionated: <0.1 IU/mL — ABNORMAL LOW (ref 0.30–0.70)
Heparin Unfractionated: 0.16 IU/mL — ABNORMAL LOW (ref 0.30–0.70)

## 2017-12-01 LAB — MAGNESIUM: MAGNESIUM: 2 mg/dL (ref 1.7–2.4)

## 2017-12-01 MED ORDER — INSULIN ASPART 100 UNIT/ML ~~LOC~~ SOLN
8.0000 [IU] | SUBCUTANEOUS | Status: DC
  Administered 2017-12-01 – 2017-12-05 (×24): 8 [IU] via SUBCUTANEOUS

## 2017-12-01 MED ORDER — POTASSIUM CHLORIDE 20 MEQ/15ML (10%) PO SOLN
40.0000 meq | Freq: Once | ORAL | Status: AC
  Administered 2017-12-01: 40 meq
  Filled 2017-12-01: qty 30

## 2017-12-01 MED ORDER — DEXMEDETOMIDINE HCL IN NACL 400 MCG/100ML IV SOLN
0.0000 ug/kg/h | INTRAVENOUS | Status: AC
Start: 2017-12-01 — End: 2017-12-04
  Administered 2017-12-01 – 2017-12-02 (×8): 0.7 ug/kg/h via INTRAVENOUS
  Administered 2017-12-03: 0.6 ug/kg/h via INTRAVENOUS
  Administered 2017-12-03: 0.7 ug/kg/h via INTRAVENOUS
  Administered 2017-12-03: 0.6 ug/kg/h via INTRAVENOUS
  Administered 2017-12-03 (×2): 0.7 ug/kg/h via INTRAVENOUS
  Administered 2017-12-03 – 2017-12-04 (×4): 0.6 ug/kg/h via INTRAVENOUS
  Filled 2017-12-01 (×17): qty 100

## 2017-12-01 MED ORDER — METOLAZONE 5 MG PO TABS
5.0000 mg | ORAL_TABLET | Freq: Once | ORAL | Status: AC
  Administered 2017-12-01: 5 mg via ORAL
  Filled 2017-12-01: qty 1

## 2017-12-01 MED ORDER — BISACODYL 10 MG RE SUPP
10.0000 mg | Freq: Every day | RECTAL | Status: DC | PRN
  Administered 2017-12-01: 10 mg via RECTAL
  Filled 2017-12-01: qty 1

## 2017-12-01 NOTE — Progress Notes (Addendum)
STROKE TEAM PROGRESS NOTE    SUBJECTIVE (INTERVAL HISTORY) Girlfriend and his daughter from [place] are at the bedside. He had  LN bx 11/30/17 but results are pending. Pt remains intubated without movement. But more alert and follows commands by moving his eyes he remains on IV heparin drip and has not had any significant bleeding.hematocrit is stable at 30.9 OBJECTIVE Temp:  [98.8 F (37.1 C)-100.2 F (37.9 C)] 100.2 F (37.9 C) (07/10 1200) Pulse Rate:  [54-158] 130 (07/10 1200) Cardiac Rhythm: Atrial fibrillation (07/10 0800) Resp:  [15-36] 28 (07/10 1200) BP: (91-160)/(60-91) 160/91 (07/10 1200) SpO2:  [92 %-96 %] 92 % (07/10 1200) FiO2 (%):  [40 %] 40 % (07/10 1125) Weight:  [325 lb 6.4 oz (147.6 kg)] 325 lb 6.4 oz (147.6 kg) (07/10 0500)  CBC:  Recent Labs  Lab 11/28/17 0611 11/29/17 0500 11/30/17 0510 12/01/17 0341  WBC 8.8 9.3 9.3 9.9  NEUTROABS 6.6 6.8  --   --   HGB 10.1* 9.4* 9.4* 9.5*  HCT 31.7* 30.6* 30.9* 31.0*  MCV 88.3 90.3 90.6 89.6  PLT 252 229 256 154    Basic Metabolic Panel:  Recent Labs  Lab 11/30/17 0628 12/01/17 0341  NA 135 138  K 3.5 3.7  CL 100 101  CO2 29 30  GLUCOSE 233* 205*  BUN 19 19  CREATININE 0.55* 0.51*  CALCIUM 9.1 9.1  MG 2.1 2.0    Lipid Panel:     Component Value Date/Time   CHOL 110 11/19/2017 0437   CHOL 119 08/09/2017 1000   TRIG 340 (H) 11/19/2017 0437   HDL 19 (L) 11/19/2017 0437   HDL 29 (L) 08/09/2017 1000   CHOLHDL 5.8 11/19/2017 0437   VLDL 68 (H) 11/19/2017 0437   LDLCALC 23 11/19/2017 0437   LDLCALC 30 08/09/2017 1000   HgbA1c:  Lab Results  Component Value Date   HGBA1C 9.1 (H) 11/19/2017   Urine Drug Screen: No results found for: LABOPIA, COCAINSCRNUR, LABBENZ, AMPHETMU, THCU, LABBARB  Alcohol Level No results found for: ETH  IMAGING Ct Angio Head W Or Wo Contrast  Ct Angio Neck W Or Wo Contrast 11/18/2017 Since the study of earlier today, there are only 2 changes. There is new consolidation  and volume loss in the right upper lobe. 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 Flow is seen distal to that however, presumably indicating patent posterior communicating arteries.   Ct Angio Head W Or Wo Contrast Ct Angio Neck W Or Wo Contrast 11/18/2017 1. Severe intracranial and extracranial atherosclerotic disease. No emergent large vessel occlusion  2. Severe stenosis right carotid bifurcation due to calcified plaque, estimated 90% diameter stenosis. No significant left carotid stenosis. Severe stenosis cavernous carotid bilaterally.  3. Both vertebral arteries are patent in the neck with severe calcific stenosis V4 segment bilaterally.  4. 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 Small calcified lymph nodes left neck suspicious for metastatic disease  5. Small lung nodules on the right, possible metastatic disease. Recommend chest CT for further evaluation.   Ct Head Wo Contrast 11/19/2017 1. Expected evolution of brainstem infarct without hemorrhage.  2. No new infarcts.  3. Left vertebral artery and basilar artery stent.  4. Acute on chronic sinus disease may be related to intubation.   Ct Chest W Contrast 11/18/2017 Right upper lobe airspace opacity is noted with air bronchograms concerning for pneumonia or possibly atelectasis. Stable mediastinal adenopathy is noted compared to prior exam. Endotracheal and nasogastric  tubes are in grossly good position. Coronary artery calcifications are noted suggesting coronary artery disease. Possible 2.8 cm left thyroid nodule. Thyroid ultrasound is recommended for further evaluation. Aortic Atherosclerosis (ICD10-I70.0).   Mr Brain Wo Contrast 11/19/2017 1. 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 No associated hemorrhage or significant mass effect.  2. Additional punctate acute/early subacute infarctions within left lateral  cerebellum and right parietal cortex.  3. Minimal chronic microvascular ischemic changes of the brain and mild parenchymal volume loss.   Ct Head Code Stroke Wo Contrast 11/18/2017 1. No acute intracranial abnormality.  Atherosclerotic calcification  2. ASPECTS is 10   Cerebral Angiogram / Stent  11/18/2017 1. 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 Occluded non dominant RT VBJ. 2. S/P endovascular revascularization of the distal basilar artery  To 90 % patency , and of 30 to 40 % of the LT VBJ with stent placement. Dominant  Lt Pcom.  Transthoracic Echocardiogram - Left ventricle: The cavity size was moderately dilated. Wall thickness was increased in a pattern of severe LVH. Systolic function was normal. The estimated ejection fraction was in the range of 55% to 60%. Wall motion was normal; there were no regional wall motion abnormalities. The study is not technically sufficient to allow evaluation of LV diastolic function. - Aortic valve: Trileaflet. Sclerosis without stenosis. There was no regurgitation. Valve area (Vmax): 2.88 cm^2. - Aorta: Aortic root ML diameter: 41.95 mm (ED). - Aortic root: The aortic root is mildly dilated. - Mitral valve: Calcified annulus. There was trivial regurgitation. - Left atrium: The atrium was mildly dilated. - Right ventricle: The cavity size was mildly dilated. Mildly reduced systolic function. - Inferior vena cava: The vessel was normal in size. The respirophasic diameter changes were in the normal range (>= 50%), consistent with normal central venous pressure. Impressions:  Compared to a prior study in 02/2017, there have been no significant changes.  Ct Head Wo Contrast 11/24/2017 1. Continued normal expected interval evolution of brainstem infarct. No evidence for hemorrhagic transformation or other complication.  2. No other new intracranial abnormality.  3. Moderate paranasal  sinus disease with bilateral mastoid effusions, likely related intubation.   Dg Chest Port 1 View 11/25/2017 Mild vascular congestion. Tubes and lines as described.   Ct Soft Tissue Neck W Contrast 11/26/2017 Aside from the fact that the patient shows endotracheal intubation and the presence of an orogastric tube, and aside from development of bilateral layering pleural effusions with dependent atelectasis, the findings are unchanged since the previous studies. Irregular mass arising from the inferior left thyroid lobe, measuring approximately 5 x 3.4 x 5.5 cm, with invasion of the surrounding tissues worrisome for thyroid carcinoma. Partial encasement of the trachea. Broad surface along the esophagus. Small but likely pathologic nodes in the lower neck and superior mediastinum, some with calcification, probably involved by metastatic disease.   Dg Chest Port 1 View 11/29/2017 Tube and catheter positions as described without pneumothorax. There is pulmonary vascular congestion with interstitial edema and small pleural effusions. Suspect a degree of underlying congestive heart failure. No airspace consolidation.     PHYSICAL EXAM General - morbid obesity,middle aged Caucasian male, intubated on sedation.  Ophthalmologic - fundi not visualized due to noncooperation.  Cardiovascular - irregularly irregular heart rate and rhythm, afib RVR.  Neuro - intubated on sedation,  Eyes open but not blinking to visual threat, pinpoint pupils, not reactive to light, eye dysconjugate with right eye inward position, right eye mid position, able to voluntarily move the eyes in the vertical plane and partially to the left past midline but not able to look to the right. Has saccadic dysmetria. Weak gag.and has respiratory effort above ventilator setting.  . Facial symmetry not able to test due to ET tube, not able to move tongue or mouth on command. No movement on all extremities, DTR diminished and no babinski.  Sensation, coordination and gait not tested.   ASSESSMENT/PLAN Mr. Ikeem Cleckler is a 62 y.o. male with PMH of DM, hemoptysis, OSA, HTN, HLD, CAD, afib off Eliquis due to hemoptysis presenting with slurred speech, right sided weakness. Was intubated for hemoptysis prevention while giving tPA. Symptoms worsened and imaging confirmed BA occlusion. S/p IR with revascularization of BA as well as left VA s/p stenting.  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   MRI head - large acute/early subacute infarction within the central pons involving both sides, greater on the right.  CTA H&N - severe vascular athero including BA stenosis, b/l VA stenosis, right ICA proximal 90% stenosis, b/l cavernous ICA stenosis R>L.  Repeat CTA H/N - new distal BA occlusion  DSA - revascularization of the distal basilar artery to 90 % patency , and of 30 to 40 % of the LT VBJ with stent placement.  CT head 11/24/17 showed normal evolution of brainstem infarct, no acute change  2D Echo - EF 55-60%  LDL - 23  HgbA1c - 9.1  VTE prophylaxis - heparin subq  Eliquis (recently stopped) and plavix prior to admission, now on aspirin 81 mg daily and Brilinta . Now on IV heparin drip.   Ongoing aggressive stroke risk factor management  Therapy recommendations:  pending  Disposition:  Pending  Thyroid mass  CTA neck - right large thyroid mass compressing on trachea.   Thyroid mass larger than CT chest in 08/2016  Evidence of tracheal erosion on bronch at this admission  CT neck with contrast showed thyroid mass concerning for  carcinoma, encasement of trachea.   Pending   Lymph node biopsy results  Based on thyroid biopsy, will consider oncology consult  Respiratory failure  CCM on board  Current intubated on sedation  no cuff leak  Fighting with vent when off sedation Likely need trach  As his airways likely to be compromised by his thyroid cancer PAF  Currently in afib RVR with respiratory distress  On eliquis in the past but off due to hemoptysis  Hold off at this time due to hemoptysis and upcoming biopsy  On ASA and brilinta  Coreg bid for rate control  Cerebral vascular athero  CTA head and neck showed right ICA proximal 90% stenosis, left ICA proximal athero, b/l cavernous ICA severe athro R>L, BA severe stenosis, b/l V4 severe athero with right V4 occlusion  S/p left V4 stent and BA revascularization  On ASA and brilinta  Hemoptysis, improved  Since 08/2016 as per family  Not sure about etiology  TB ruled out in the past  Possible tracheal  erosion from right thyroid mass   CT chest showed scattered long nodules, RUE suspected PNA  CT neck showed thyroid mass concerning for carcinoma, encasement of trachea.   CCM on board  Fever   Tmax 102.4->102.5->100.0->98.3->100.8  CXR - progression of bibasilar consolidation -> Mild vascular congestion is noted without focal confluent infiltrate  Off antibiotics  Hypertension  Stable on the low side  Now on coreg bid  Also on HCTZ for limb swelling . Long term BP goal normotensive.  Hyperlipidemia  Lipid lowering medication PTA: Crestor 10 mg daily  LDL 23, goal < 70  On Crestor  Continue statin at discharge  Diabetes  HgbA1c 9.1, goal < 7.0  Uncontrolled  Off insulin drip, CBG fluctuate  On lantus and SSI   CBG monitoring  Other Stroke Risk Factors  Advanced age  Former cigarette smoker - quit, 40 pack year history  Morbid obesity, Body mass index is 46.69 kg/m.    Hx stroke/TIA  Coronary  artery disease   PAD with claudication  OSA on CPAP  Other Active Problems  Leukocytosis, resolved WBC 9.3  Anemia 9.4   Hyponatremia 134  Plan  He had lymph node biopsy by interventional radiology under ultrasound guidance. Pending pathology will need to discuss with patient about his wishes and goals of care as he may not be a good candidate for major surgery, chemotherapy or radiation given his quadriplegia and poor neurological status. Plan Continue IV heparin drip  He may also need tracheostomy but surgically this may be challenging given his thyroid mass surrounding his trachea. Long discussion att bedside with the patient,his daughter and girlfriend. Anticipated arrival of patient's family in the next few days to have family meeting to decide goals of care.May need to consult palliative care team prior to making definitive decisions. Long discussion with patient, his girlfriend as well as his daughter and answered questions. This patient is critically ill and at significant risk of neurological worsening, death and care requires constant monitoring of vital signs, hemodynamics,respiratory and cardiac monitoring, extensive review of multiple databases, frequent neurological assessment, discussion with family, other specialists and medical decision making of high complexity.I have made any additions or clarifications directly to the above note.This critical care time does not reflect procedure time, or teaching time or supervisory time of PA/NP/Med Resident etc but could involve care discussion time.  I spent 83minutes of neurocritical care time  in the care of  this patient. Antony Contras, MD Medical Director [facility] Pager: [phone] 12/01/2017 1:29 PM    To contact Stroke Continuity provider, please refer to [email or web address] After hours, contact General Neurology

## 2017-12-01 NOTE — Progress Notes (Signed)
ANTICOAGULATION CONSULT NOTE   Pharmacy Consult for Heparin Indication: atrial fibrillation  No Known Allergies  Patient Measurements: Height: 5' 10"  (177.8 cm) Weight: (!) 325 lb 6.4 oz (147.6 kg) IBW/kg (Calculated) : 73 Heparin Dosing Weight: 109.5  Vital Signs: Temp: 98.5 F (36.9 C) (07/10 1600) Temp Source: Axillary (07/10 1600) BP: 108/71 (07/10 1800) Pulse Rate: 96 (07/10 1800)  Labs: Recent Labs    11/29/17 0500 11/30/17 0510 11/30/17 0628  12/01/17 0300 12/01/17 0341 12/01/17 1100 12/01/17 1758  HGB 9.4* 9.4*  --   --   --  9.5*  --   --   HCT 30.6* 30.9*  --   --   --  31.0*  --   --   PLT 229 256  --   --   --  281  --   --   HEPARINUNFRC  --   --   --    < > <0.10*  --  <0.10* 0.16*  CREATININE 0.57*  --  0.55*  --   --  0.51*  --   --    < > = values in this interval not displayed.    Estimated Creatinine Clearance: 141 mL/min (A) (by C-G formula based on SCr of 0.51 mg/dL (L)).   Medical History: Past Medical History:  Diagnosis Date  . Collagen vascular disease ([place])   . Coronary artery disease   . GERD (gastroesophageal reflux disease)   . High cholesterol   . Hypertension   . OSA (obstructive sleep apnea)    "dx'd years ago; never have worn mask; mask never ordered" (04/01/2017)  . Pneumonia 2011  . Type II diabetes mellitus ([place]) 07/2016    Medications:  Scheduled:  . aspirin  81 mg Oral Daily   Or  . aspirin  81 mg Per Tube Daily  . carvedilol  6.25 mg Oral BID WC  . chlorhexidine gluconate (MEDLINE KIT)  15 mL Mouth Rinse BID  . Chlorhexidine Gluconate Cloth  6 each Topical Q0600  . clonazepam  1 mg Oral TID  . docusate  100 mg Oral BID  . feeding supplement (PRO-STAT SUGAR FREE 64)  30 mL Per Tube TID  . furosemide  40 mg Intravenous Q8H  . insulin aspart  0-20 Units Subcutaneous Q4H  . insulin aspart  8 Units Subcutaneous Q4H  . insulin glargine  45 Units Subcutaneous QHS  . ipratropium-albuterol  3 mL Nebulization Q4H  .  mouth rinse  15 mL Mouth Rinse 10 times per day  . pantoprazole sodium  40 mg Per Tube Daily  . rosuvastatin  20 mg Per Tube q1800  . sodium chloride flush  10-40 mL Intracatheter Q12H  . ticagrelor  90 mg Oral BID   Or  . ticagrelor  90 mg Per Tube BID    Assessment: 62 YO male admitted on 6/27 for stroke and was found to have a mass on his thyroid causing hemoptysis. Patient was on Eliquis PTA for atrial fibrillation but last dose was 3 weeks prior to admission. Patient is currently on Aspirin and Brilinta. Pharmacy has been consulted to dose heparin for further anticoagulation. CBC stable. No s/sx of bleeding documented.  Heparin level remains subtherapeutic.  No bleeding noted  Goal of Therapy: Heparin level 0.3-0.5 units/ml Monitor platelets by anticoagulation protocol: Yes   Plan:  Increase heparin due to subtherapeutic level Heparin increased to 2500 units/hr  Check heparin level in ~ 6 hours @ 0200 Monitor daily CBC and s/sx of  bleeding  Alanda Slim, PharmD, [facility] 12/01/2017   7:16 PM

## 2017-12-01 NOTE — Progress Notes (Signed)
ANTICOAGULATION CONSULT NOTE   Pharmacy Consult for Heparin Indication: atrial fibrillation  No Known Allergies  Patient Measurements: Height: 5' 10"  (177.8 cm) Weight: (!) 325 lb 6.4 oz (147.6 kg) IBW/kg (Calculated) : 73 Heparin Dosing Weight: 109.5  Vital Signs: Temp: 98.8 F (37.1 C) (07/10 0745) Temp Source: Axillary (07/10 0745) BP: 125/79 (07/10 1100) Pulse Rate: 113 (07/10 1125)  Labs: Recent Labs    11/29/17 0500 11/30/17 0510 11/30/17 0628  11/30/17 1901 12/01/17 0300 12/01/17 0341 12/01/17 1100  HGB 9.4* 9.4*  --   --   --   --  9.5*  --   HCT 30.6* 30.9*  --   --   --   --  31.0*  --   PLT 229 256  --   --   --   --  281  --   HEPARINUNFRC  --   --   --    < > 0.15* <0.10*  --  <0.10*  CREATININE 0.57*  --  0.55*  --   --   --  0.51*  --    < > = values in this interval not displayed.    Estimated Creatinine Clearance: 141 mL/min (A) (by C-G formula based on SCr of 0.51 mg/dL (L)).   Medical History: Past Medical History:  Diagnosis Date  . Collagen vascular disease ([place])   . Coronary artery disease   . GERD (gastroesophageal reflux disease)   . High cholesterol   . Hypertension   . OSA (obstructive sleep apnea)    "dx'd years ago; never have worn mask; mask never ordered" (04/01/2017)  . Pneumonia 2011  . Type II diabetes mellitus ([place]) 07/2016    Medications:  Scheduled:  . aspirin  81 mg Oral Daily   Or  . aspirin  81 mg Per Tube Daily  . carvedilol  6.25 mg Oral BID WC  . chlorhexidine gluconate (MEDLINE KIT)  15 mL Mouth Rinse BID  . Chlorhexidine Gluconate Cloth  6 each Topical Q0600  . clonazepam  1 mg Oral TID  . docusate  100 mg Oral BID  . feeding supplement (PRO-STAT SUGAR FREE 64)  30 mL Per Tube TID  . furosemide  40 mg Intravenous Q8H  . insulin aspart  0-20 Units Subcutaneous Q4H  . insulin aspart  8 Units Subcutaneous Q4H  . insulin glargine  45 Units Subcutaneous QHS  . ipratropium-albuterol  3 mL Nebulization Q4H  .  mouth rinse  15 mL Mouth Rinse 10 times per day  . pantoprazole sodium  40 mg Per Tube Daily  . rosuvastatin  20 mg Per Tube q1800  . sodium chloride flush  10-40 mL Intracatheter Q12H  . ticagrelor  90 mg Oral BID   Or  . ticagrelor  90 mg Per Tube BID    Assessment: 62 YO male admitted on 6/27 for stroke and was found to have a mass on his thyroid causing hemoptysis. Patient was on Eliquis PTA for atrial fibrillation but last dose was 3 weeks prior to admission. Patient is currently on Aspirin and Brilinta. Pharmacy has been consulted to dose heparin for further anticoagulation. CBC stable. No s/sx of bleeding documented.  Heparin level remains subtherapeutic.  No bleeding noted  Goal of Therapy: Heparin level 0.3-0.5 units/ml Monitor platelets by anticoagulation protocol: Yes   Plan:  Increase heparin due to subtherapeutic level today (<0.1) Heparin increased to 2200 units/hr  Check heparin level in ~ 6 hours @ 1800 Monitor daily CBC and  s/sx of bleeding  Gwenlyn Found, [place] D PGY1 Pharmacy Resident  Phone [phone] 12/01/2017   12:01 PM

## 2017-12-01 NOTE — Progress Notes (Addendum)
Inpatient Diabetes Program Recommendations  AACE/ADA: New Consensus Statement on Inpatient Glycemic Control (2015)  Target Ranges:  Prepandial:   less than 140 mg/dL      Peak postprandial:   less than 180 mg/dL (1-2 hours)      Critically ill patients:  140 - 180 mg/dL   Results for [name] [name], [name] [name] (MRN [id number]) as of 12/01/2017 10:18  Ref. Range 11/30/2017 15:45 11/30/2017 19:41 11/30/2017 23:06 12/01/2017 03:17 12/01/2017 07:45  Glucose-Capillary Latest Ref Range: 70 - 99 mg/dL 223 (H) 211 (H) 218 (H) 186 (H) 216 (H)   Review of Glycemic Control  Diabetes history: DM 2 Outpatient Diabetes medications: Victoza 1.2 mg Daily, Metformin 1000 mg BID Current orders for Inpatient glycemic control: Lantus 45 units, Novolog 0-20 units Q4 hours, Novolog 6 units Q4 hours Tube Feed Coverage  Inpatient Diabetes Program Recommendations:    Consider increasing Novolog Tube Feed Coverage to 8 units Q4 hours.  Thanks,  Tama Headings RN, MSN, BC-ADM, [facility] Inpatient Diabetes Coordinator Team Pager [phone] (8a-5p)

## 2017-12-01 NOTE — Progress Notes (Signed)
PULMONARY / CRITICAL CARE MEDICINE   Name: [name] [name] MRN: [id number] DOB: 1955/08/29    ADMISSION DATE:  11/18/2017 CONSULTATION DATE: 11/18/2017  REFERRING MD: Emergency department  CHIEF COMPLAINT: Stroke  HISTORY OF PRESENT ILLNESS:    [name] [name]  is a 62 year old man with atrial fibrillation, formally on anticoagulation was stopped due to recent hemoptysis.  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  He presented with an acute stroke 6/27, was intubated and potential tracheal erosion site covered with cuff balloon to facilitate TPA.  Unfortunately during the hospitalization is also experienced a second stroke and underwent mechanical thrombectomy, stent placement 7/1.  SUBJECTIVE:  Pending biopsy of right cervical lymph node per interventional radiology on 7/8 No significant changes per RN, patient still blinks to communicate. Unclear last BM  VITAL SIGNS: BP 125/79   Pulse (!) 108   Temp 98.8 F (37.1 C) (Axillary)   Resp (!) 28   Ht 5\' 10"  (1.778 m)   Wt (!) 325 lb 6.4 oz (147.6 kg)   SpO2 93%   BMI 46.69 kg/m   HEMODYNAMICS:    VENTILATOR SETTINGS: Vent Mode: PCV FiO2 (%):  [40 %] 40 % Set Rate:  [20 bmp] 20 bmp PEEP:  [10 cmH20] 10 cmH20 Plateau Pressure:  [18 cmH20-26 cmH20] 22 cmH20  INTAKE / OUTPUT: I/O last 3 completed shifts: In: 3776.4 [I.V.:1566.4; NG/GT:2210] Out: 4550 [Urine:4550]  PHYSICAL EXAMINATION: General:  Critically ill obese, adult male on MV in NAD HEENT: MM pink/moist, pupils 3/reactive, ETT, OGT Neuro: Eyes open, no spont movement, blinks 1 - yes, 2 -no CV: IRIR, rate 115, +2 pulses, no murmur PULM: even/non-labored, on PCV with TV in 500's, PEEP 10 at 94%, coarse throughout, diminished in bases GI: obese, soft, non-tender, bs hypo active  Extremities: warm/dry, anasarca  Skin: no rashes   Recent Labs  Lab 11/29/17 0500 11/30/17 0628 12/01/17 0341  NA 134* 135 138  K 4.8  3.5 3.7  CL 103 100 101  CO2 28 29 30   BUN 21 19 19   CREATININE 0.57* 0.55* 0.51*  GLUCOSE 242* 233* 205*    Electrolytes Recent Labs  Lab 11/29/17 0500 11/30/17 0628 12/01/17 0341  CALCIUM 8.6* 9.1 9.1  MG 1.9 2.1 2.0    CBC Recent Labs  Lab 11/29/17 0500 11/30/17 0510 12/01/17 0341  WBC 9.3 9.3 9.9  HGB 9.4* 9.4* 9.5*  HCT 30.6* 30.9* 31.0*  PLT 229 256 281    Coag's No results for input(s): APTT, INR in the last 168 hours.  Sepsis Markers Recent Labs  Lab 11/25/17 0403  PROCALCITON 0.13    ABG Recent Labs  Lab 11/27/17 0400 11/28/17 0425 11/29/17 0438  PHART 7.458* 7.460* 7.445  PCO2ART 37.0 40.0 39.1  PO2ART 78.0* 67.1* 71.0*    Liver Enzymes Recent Labs  Lab 11/25/17 0403  AST 14*  ALT 15  ALKPHOS 43  BILITOT 0.6  ALBUMIN 2.0*    Cardiac Enzymes No results for input(s): TROPONINI, PROBNP in the last 168 hours.  Glucose Recent Labs  Lab 11/30/17 1123 11/30/17 1545 11/30/17 1941 11/30/17 2306 12/01/17 0317 12/01/17 0745  GLUCAP 255* 223* 211* 218* 186* 216*    Imaging Dg Chest Port 1 View  Result Date: 12/01/2017 CLINICAL DATA:  62 year old male status post brainstem infarct last month. Respiratory failure. EXAM: PORTABLE CHEST 1 VIEW COMPARISON:  11/30/2017 and earlier. FINDINGS: Portable AP semi upright view at 0547 hours. Stable endotracheal tube tip between the clavicles and  carina. Enteric tube courses to the left abdomen, tip not included. Stable right PICC line. Stable cardiomegaly and mediastinal contours. Stable dense retrocardiac opacity. Stable pulmonary vascularity without pulmonary edema. No pneumothorax. Small layering pleural effusions demonstrated on recent neck CT 11/26/2017. Paucity of bowel gas in the upper abdomen. IMPRESSION: 1.  Stable lines and tubes. 2. Stable ventilation with cardiomegaly, lower lobe collapse or consolidation, and small pleural effusions. Electronically Signed   By: Genevie Ann M.D.   On:  12/01/2017 07:22   CULTURES: 6/27 MRSA PCR >> neg 11/21/2017 sputum>> normal flora  ANTIBIOTICS: 11/18/2017 Unasyn>> 7/2 7/2 Cefotan >> 7/6  SIGNIFICANT EVENTS: 11/18/2017 intubated Right cervical lymph node biopsy 7/8 >>   LINES/TUBES: 11/18/2017 endotracheal tube>> 6/27 OGT >> 6/27 Foley >>  DISCUSSION:  ASSESSMENT / PLAN:  PULMONARY A: Vent dependent respiratory failure Possible airway compromise due to paratracheal/thyroid mass Pulmonary nodules, question metastatic Right upper lobe infiltrate at presentation, presumed pneumonia, culture negative COPD Continue PCV, rate 20 Wean PEEP as able VAP measures  Continue lasix 40 mg q 8  Pending right cervical node biopsy 7/8 -  tissue diagnosis will influence overall prognosis and therefore our plans regarding continued aggressive care.   CARDIOVASCULAR A:  History of  Paroxysmal atrial fibrillation, currently off anticoagulation for recent hemoptysis of 3 weeks duration     Coronary artery disease.   Peripheral vascular disease Continue lopressor prn for rate control  Continue crestor, coreg, and heparin gtt  Lasix as above  Brilinta, aspirin   RENAL  A:   No acute renal issues - remains positive 13L + P:   diuresis as above Additional dose of metolazone today  KCL 40 meq x 1 Trend BMP/ mag/ phos/ daily wt / urinary output Replace electrolytes as indicated  Thyroid Mass with possible associated airway compromise Appreciate Dr. Pascal Lux assistance with interventional radiology.  Await tissue diagnosis.  This will influence further plans.  If aggressive malignancy then unclear that continued aggressive care, including evaluation for tracheostomy would be in the patient's overall best interest  GASTROINTESTINAL A:   History of GERD Nutrition P:   PPI as ordered Tube feeding at goal Add dulcolax and colace   HEMATOLOGIC A:   Chronic anticoagulation for atrial fibrillation Off anticoagulation for 3 weeks due  to hemoptysis Trend CBC Continue aspirin and Brilinta Heparin systemic per pharmacy   INFECTIOUS A:   Observed aspiration, right upper lobe pneumonia P:   Monitor clinically  Trend WBC/ fever curve   ENDOCRINE Diabetes mellitus - ongoing hyperglycemia  P:   CBG q 4 SSI - resistant Lantus 45 units HS Increase novolog scheduled q 4 from 6 to 8 units  PAD Patient had a cool right foot which resolved with removing his right femoral sheath.    NEUROLOGIC A:   Multiple strokes, initially received TPA and then underwent basilar artery thrombectomy and stent placement Appreciate neurology management Brilinta, aspirin    FAMILY: Family meeting with neurology ?scheduled for Friday pending biopsy results.  Girlfriend and daughter updated at bedside 7/10.  Kennieth Rad, AGACNP-BC [facility] Pgr: [phone] or if no answer [phone] 12/01/2017, 12:20 PM

## 2017-12-01 NOTE — Progress Notes (Signed)
ANTICOAGULATION CONSULT NOTE   Pharmacy Consult for Heparin Indication: atrial fibrillation  No Known Allergies  Patient Measurements: Height: _0  (177.8 cm) Weight: (!) 327 lb 2.6 oz (148.4 kg)(Simultaneous filing. User may not have seen previous data.) IBW/kg (Calculated) : 73 Heparin Dosing Weight: 109.5  Vital Signs: Temp: 99 F (37.2 C) (07/10 0400) Temp Source: Axillary (07/10 0400) BP: 100/65 (07/10 0350) Pulse Rate: 95 (07/10 0350)  Labs: Recent Labs    11/29/17 0500 11/30/17 0510 11/30/17 0628 11/30/17 1716 11/30/17 1901 12/01/17 0300 12/01/17 0341  HGB 9.4* 9.4*  --   --   --   --  9.5*  HCT 30.6* 30.9*  --   --   --   --  31.0*  PLT 229 256  --   --   --   --  281  HEPARINUNFRC  --   --   --  1.34* 0.15* <0.10*  --   CREATININE 0.57*  --  0.55*  --   --   --  0.51*    Estimated Creatinine Clearance: 141.5 mL/min (A) (by C-G formula based on SCr of 0.51 mg/dL (L)).   Medical History: Past Medical History:  Diagnosis Date  . Collagen vascular disease ([place])   . Coronary artery disease   . GERD (gastroesophageal reflux disease)   . High cholesterol   . Hypertension   . OSA (obstructive sleep apnea)    "dx'd years ago; never have worn mask; mask never ordered" (04/01/2017)  . Pneumonia 2011  . Type II diabetes mellitus ([place]) 07/2016    Medications:  Scheduled:  . aspirin  81 mg Oral Daily   Or  . aspirin  81 mg Per Tube Daily  . carvedilol  6.25 mg Oral BID WC  . chlorhexidine gluconate (MEDLINE KIT)  15 mL Mouth Rinse BID  . Chlorhexidine Gluconate Cloth  6 each Topical Q0600  . clonazepam  1 mg Oral TID  . docusate  100 mg Oral BID  . feeding supplement (PRO-STAT SUGAR FREE 64)  30 mL Per Tube TID  . furosemide  40 mg Intravenous Q8H  . insulin aspart  0-20 Units Subcutaneous Q4H  . insulin aspart  6 Units Subcutaneous Q4H  . insulin glargine  45 Units Subcutaneous QHS  . ipratropium-albuterol  3 mL Nebulization Q4H  . mouth rinse  15 mL  Mouth Rinse 10 times per day  . pantoprazole sodium  40 mg Per Tube Daily  . rosuvastatin  20 mg Per Tube q1800  . sodium chloride flush  10-40 mL Intracatheter Q12H  . ticagrelor  90 mg Oral BID   Or  . ticagrelor  90 mg Per Tube BID    Assessment: 62 YO male admitted on 6/27 for stroke and was found to have a mass on his thyroid causing hemoptysis. Patient was on Eliquis PTA for atrial fibrillation but last dose was 3 weeks prior to admission. Patient is currently on Aspirin and Brilinta. Pharmacy has been consulted to dose heparin for further anticoagulation. CBC stable. No s/sx of bleeding documented.  Heparin level remains subtherapeutic.  No bleeding noted  Goal of Therapy: Heparin level 0.3-0.5 units/ml Monitor platelets by anticoagulation protocol: Yes   Plan:  Increase heparin infusion to 1900 units/hr  Check heparin level in ~ 6 hours Monitor daily CBC and s/sx of bleeding  Excell Seltzer PharmD Clinical Pharmacist 12/01/2017 4:11 AM

## 2017-12-02 DIAGNOSIS — I2510 Atherosclerotic heart disease of native coronary artery without angina pectoris: Secondary | ICD-10-CM

## 2017-12-02 LAB — GLUCOSE, CAPILLARY
GLUCOSE-CAPILLARY: 230 mg/dL — AB (ref 70–99)
GLUCOSE-CAPILLARY: 161 mg/dL — AB (ref 70–99)
GLUCOSE-CAPILLARY: 239 mg/dL — AB (ref 70–99)
Glucose-Capillary: 158 mg/dL — ABNORMAL HIGH (ref 70–99)
Glucose-Capillary: 220 mg/dL — ABNORMAL HIGH (ref 70–99)
Glucose-Capillary: 215 mg/dL — ABNORMAL HIGH (ref 70–99)
Glucose-Capillary: 210 mg/dL — ABNORMAL HIGH (ref 70–99)

## 2017-12-02 LAB — RENAL FUNCTION PANEL
Albumin: 2.3 g/dL — ABNORMAL LOW (ref 3.5–5.0)
Anion gap: 9 (ref 5–15)
BUN: 22 mg/dL (ref 8–23)
CALCIUM: 9.7 mg/dL (ref 8.9–10.3)
CHLORIDE: 94 mmol/L — AB (ref 98–111)
CO2: 33 mmol/L — AB (ref 22–32)
Creatinine, Ser: 0.58 mg/dL — ABNORMAL LOW (ref 0.61–1.24)
GFR calc Af Amer: >60 mL/min (ref >60)
GFR calc non Af Amer: >60 mL/min (ref >60)
GLUCOSE: 147 mg/dL — AB (ref 70–99)
Phosphorus: 4.2 mg/dL (ref 2.5–4.6)
Potassium: 3.4 mmol/L — ABNORMAL LOW (ref 3.5–5.1)
SODIUM: 136 mmol/L (ref 135–145)

## 2017-12-02 LAB — CBC
HCT: 32.8 % — ABNORMAL LOW (ref 39.0–52.0)
Hemoglobin: 10.3 g/dL — ABNORMAL LOW (ref 13.0–17.0)
MCH: 27.7 pg (ref 26.0–34.0)
MCHC: 31.4 g/dL (ref 30.0–36.0)
MCV: 88.2 fL (ref 78.0–100.0)
PLATELETS: 319 10*3/uL (ref 150–400)
RBC: 3.72 MIL/uL — ABNORMAL LOW (ref 4.22–5.81)
RDW: 13.7 % (ref 11.5–15.5)
WBC: 11.1 10*3/uL — ABNORMAL HIGH (ref 4.0–10.5)

## 2017-12-02 LAB — HEPARIN LEVEL (UNFRACTIONATED): Heparin Unfractionated: 0.38 IU/mL (ref 0.30–0.70)

## 2017-12-02 LAB — MAGNESIUM: Magnesium: 2.2 mg/dL (ref 1.7–2.4)

## 2017-12-02 NOTE — Consult Note (Signed)
New Hematology/Oncology Consult   Referral ZO:XWRUEA Leonie Man       Reason for Referral: Papillary thyroid cancer  HPI: Mr. Danner was admitted 11/18/2017 with left-sided weakness and slurred speech.  He was given TPA and referred for a cerebral arteriogram.  He underwent endovascular revascularization of the distal basilar artery on 11/18/2017.  He was intubated for airway protection in the setting of hemoptysis.  There was evidence of bleeding and erosion at 3 cm below the vocal cords.  His stroke symptoms progressed in the hospital with resultant quadriplegia, a 6th nerve palsy, and 9 nystagmus.  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  An MRI of the brain revealed an acute/subacute infarct in the central pons.  He was taken off of Eliquis prior to hospital admission secondary to hemoptysis.  A CT of the neck on 11/18/2017 revealed a 2.5 x 4 cm left thyroid mass with small calcified left neck lymph node suspicious for metastatic disease. A CT of the chest on 11/18/2017 revealed right upper lobe airspace disease, mediastinal adenopathy, and a 2.8 cm left thyroid nodule.  He was referred for an ultrasound-guided biopsy of a right cervical lymph node on 11/29/2017.  The pathology revealed metastatic papillary thyroid carcinoma.  He remains intubated.  He is not moving, but is able to blink his eyes to command.  His daughter is at the bedside.    Past Medical History:  Diagnosis Date  . Collagen vascular disease ([place])   . Coronary artery disease   . GERD (gastroesophageal reflux disease)   . High cholesterol   . Hypertension   . OSA (obstructive sleep apnea)    "dx'd years ago; never have worn mask; mask never ordered" (04/01/2017)  . Pneumonia 2011  . Type II diabetes mellitus ([place]) 07/2016   Atrial fibrillation  Past Surgical History:  Procedure Laterality Date  . CORONARY ANGIOPLASTY WITH STENT PLACEMENT  2011; 07/2016   s/p PCI  . IR ANGIO EXTRACRAN SEL COM CAROTID INNOMINATE UNI BILAT MOD  SED  11/18/2017  . IR ANGIO VERTEBRAL SEL SUBCLAVIAN INNOMINATE UNI R MOD SED  11/18/2017  . IR INTRA CRAN STENT  11/18/2017  . IR US GUIDE BX ASP/DRAIN  11/29/2017  . LOWER EXTREMITY ANGIOGRAPHY Bilateral 04/01/2017   Procedure: Lower Extremity Angiography;  Surgeon: Lorretta Harp, MD;  Location: [facility];  Service: Cardiovascular;  Laterality: Bilateral;  . PERIPHERAL VASCULAR INTERVENTION Left 04/01/2017   Procedure: PERIPHERAL VASCULAR INTERVENTION;  Surgeon: Lorretta Harp, MD;  Location: [facility];  Service: Cardiovascular;  Laterality: Left;  common iliac  . RADIOLOGY WITH ANESTHESIA N/A 11/18/2017   Procedure: RADIOLOGY WITH ANESTHESIA;  Surgeon: Luanne Bras, MD;  Location: [facility];  Service: Radiology;  Laterality: N/A;  . VIDEO BRONCHOSCOPY Bilateral 08/25/2016   Procedure: VIDEO BRONCHOSCOPY WITHOUT FLUORO;  Surgeon: Collene Gobble, MD;  Location: [facility];  Service: Cardiopulmonary;  Laterality: Bilateral;  :   Current Facility-Administered Medications:  .  acetaminophen (TYLENOL) tablet 650 mg, 650 mg, Oral, Q4H PRN **OR** acetaminophen (TYLENOL) solution 650 mg, 650 mg, Per Tube, Q4H PRN, 650 mg at 11/29/17 2045 **OR** acetaminophen (TYLENOL) suppository 650 mg, 650 mg, Rectal, Q4H PRN, Luanne Bras, MD, 650 mg at 11/19/17 0738 .  aspirin chewable tablet 81 mg, 81 mg, Oral, Daily **OR** aspirin chewable tablet 81 mg, 81 mg, Per Tube, Daily, Deveshwar, Sanjeev, MD, 81 mg at 12/02/17 1050 .  bisacodyl (DULCOLAX) suppository 10 mg, 10 mg, Rectal, Daily PRN, Moshe Cipro,  Carmelia Bake, NP, 10 mg at 12/01/17 1151 .  carvedilol (COREG) tablet 6.25 mg, 6.25 mg, Oral, BID WC, Rosalin Hawking, MD, 6.25 mg at 12/02/17 0846 .  chlorhexidine gluconate (MEDLINE KIT) (PERIDEX) 0.12 % solution 15 mL, 15 mL, Mouth Rinse, BID, Tarry Kos, MD, 15 mL at 12/02/17 0851 .  Chlorhexidine Gluconate Cloth 2 % PADS 6 each, 6 each, Topical, Q0600, Rosalin Hawking, MD, 6 each at  12/01/17 2200 .  clonazePAM (KLONOPIN) disintegrating tablet 1 mg, 1 mg, Oral, TID, Sampson Goon, MD, 1 mg at 12/02/17 1050 .  dexmedetomidine (PRECEDEX) 400 MCG/100ML (4 mcg/mL) infusion, 0-1.2 mcg/kg/hr, Intravenous, Continuous, Byrum, Rose Fillers, MD, Last Rate: 26.7 mL/hr at 12/02/17 1241, 0.7 mcg/kg/hr at 12/02/17 1241 .  docusate (COLACE) 50 MG/5ML liquid 100 mg, 100 mg, Oral, BID, Sampson Goon, MD, 100 mg at 12/02/17 1050 .  feeding supplement (PRO-STAT SUGAR FREE 64) liquid 30 mL, 30 mL, Per Tube, TID, Rosalin Hawking, MD, 30 mL at 12/02/17 1050 .  feeding supplement (VITAL HIGH PROTEIN) liquid 1,000 mL, 1,000 mL, Per Tube, Continuous, Byrum, Rose Fillers, MD, Last Rate: 65 mL/hr at 12/02/17 0600, 1,000 mL at 12/02/17 0600 .  fentaNYL (SUBLIMAZE) bolus via infusion 50-100 mcg, 50-100 mcg, Intravenous, Q1H PRN, Anders Simmonds, MD, 100 mcg at 11/29/17 1400 .  fentaNYL (SUBLIMAZE) injection 25-75 mcg, 25-75 mcg, Intravenous, Q2H PRN, Tarry Kos, MD .  fentaNYL 2593mg in NS 2520m(104mml) infusion-PREMIX, 25-400 mcg/hr, Intravenous, Continuous, YacRush FarmerD, Last Rate: 10 mL/hr at 12/02/17 1200, 100 mcg/hr at 12/02/17 1200 .  furosemide (LASIX) injection 40 mg, 40 mg, Intravenous, Q8H, Byrum, RobRose FillersD, 40 mg at 12/02/17 0626 .  heparin ADULT infusion 100 units/mL (25000 units/250m92mdium chloride 0.45%), 3,100 Units/hr, Intravenous, Continuous, Rudisill, TonyJodean LimaH, Last Rate: 31 mL/hr at 12/02/17 1335, 3,100 Units/hr at 12/02/17 1335 .  insulin aspart (novoLOG) injection 0-20 Units, 0-20 Units, Subcutaneous, Q4H, GraySampson Goon, 7 Units at 12/02/17 1239 .  insulin aspart (novoLOG) injection 8 Units, 8 Units, Subcutaneous, Q4H, SimpJennelle HumanNP, 8 Units at 12/02/17 1239 .  insulin glargine (LANTUS) injection 45 Units, 45 Units, Subcutaneous, QHS, GraySampson Goon, 45 Units at 12/01/17 2220 .  ipratropium-albuterol (DUONEB) 0.5-2.5 (3) MG/3ML nebulizer solution 3 mL, 3  mL, Nebulization, Q4H, RoseTarry Kos, 3 mL at 12/02/17 1154 .  lidocaine (PF) (XYLOCAINE) 1 % injection, , , PRN, WattSandi Mariscal, 10 mL at 11/29/17 1442 .  MEDLINE mouth rinse, 15 mL, Mouth Rinse, 10 times per day, RoseTarry Kos, 15 mL at 12/02/17 1240 .  metoprolol tartrate (LOPRESSOR) injection 5 mg, 5 mg, Intravenous, Q3H PRN, SommAnders Simmonds, 5 mg at 12/01/17 2221 .  midazolam (VERSED) injection 2 mg, 2 mg, Intravenous, Q2H PRN, EubaOmar Person, 2 mg at 12/01/17 2221 .  pantoprazole sodium (PROTONIX) 40 mg/20 mL oral suspension 40 mg, 40 mg, Per Tube, Daily, Xu, Rosalin Hawking, 40 mg at 12/02/17 1050 .  rosuvastatin (CRESTOR) tablet 20 mg, 20 mg, Per Tube, q1800, GraySampson Goon, 20 mg at 12/01/17 1800 .  sodium chloride flush (NS) 0.9 % injection 10-40 mL, 10-40 mL, Intracatheter, Q12H, Xu, Rosalin Hawking, 10 mL at 12/02/17 1050 .  sodium chloride flush (NS) 0.9 % injection 10-40 mL, 10-40 mL, Intracatheter, PRN, Xu, Rosalin Hawking .  ticagrelor (BRILINTA) tablet 90 mg, 90 mg, Oral, BID **OR** ticagrelor (BRILINTA) tablet 90 mg,  90 mg, Per Tube, BID, Deveshwar, Sanjeev, MD, 90 mg at 12/02/17 1050:  . aspirin  81 mg Oral Daily   Or  . aspirin  81 mg Per Tube Daily  . carvedilol  6.25 mg Oral BID WC  . chlorhexidine gluconate (MEDLINE KIT)  15 mL Mouth Rinse BID  . Chlorhexidine Gluconate Cloth  6 each Topical Q0600  . clonazepam  1 mg Oral TID  . docusate  100 mg Oral BID  . feeding supplement (PRO-STAT SUGAR FREE 64)  30 mL Per Tube TID  . furosemide  40 mg Intravenous Q8H  . insulin aspart  0-20 Units Subcutaneous Q4H  . insulin aspart  8 Units Subcutaneous Q4H  . insulin glargine  45 Units Subcutaneous QHS  . ipratropium-albuterol  3 mL Nebulization Q4H  . mouth rinse  15 mL Mouth Rinse 10 times per day  . pantoprazole sodium  40 mg Per Tube Daily  . rosuvastatin  20 mg Per Tube q1800  . sodium chloride flush  10-40 mL Intracatheter Q12H  . ticagrelor   90 mg Oral BID   Or  . ticagrelor  90 mg Per Tube BID  :  No Known Allergies:  FH:  SOCIAL HISTORY: He lives with a girlfriend in [place].  He works in Engineer, production.  He has a history of tobacco use.  Review of Systems: Unable to obtain review of systems as patient is intubated    Physical Exam:  Blood pressure 106/65, pulse 100, temperature 99.4 F (37.4 C), temperature source Axillary, resp. rate (!) 26, height 5' 10"  (1.778 m), weight (!) 316 lb 12.8 oz (143.7 kg), SpO2 94 %.  HEENT: Endotracheal tube in place, neck without palpable mass Lungs: Moves air bilaterally Cardiac: Distant heart sounds Abdomen: No hepatosplenomegaly GU: Scrotal edema Vascular: 1+ edema at the hands Lymph nodes: No cervical, supraclavicular, axillary, or inguinal nodes Neurologic: Opens eyes, blinks to command, otherwise not moving.   LABS:  Recent Labs    12/01/17 0341 12/02/17 0200  WBC 9.9 11.1*  HGB 9.5* 10.3*  HCT 31.0* 32.8*  PLT 281 319    Recent Labs    12/01/17 0341 12/02/17 0219  NA 138 136  K 3.7 3.4*  CL 101 94*  CO2 30 33*  GLUCOSE 205* 147*  BUN 19 22  CREATININE 0.51* 0.58*  CALCIUM 9.1 9.7      RADIOLOGY:  Ct Angio Head W Or Wo Contrast  Result Date: 11/18/2017 CLINICAL DATA:  Clinical deterioration. Concern regarding basilar thrombosis. EXAM: CT ANGIOGRAPHY HEAD AND NECK TECHNIQUE: Multidetector CT imaging of the head and neck was performed using the standard protocol during bolus administration of intravenous contrast. Multiplanar CT image reconstructions and MIPs were obtained to evaluate the vascular anatomy. Carotid stenosis measurements (when applicable) are obtained utilizing NASCET criteria, using the distal internal carotid diameter as the denominator. CONTRAST:  117m ISOVUE-370 IOPAMIDOL (ISOVUE-370) INJECTION 76% COMPARISON:  Earlier same day. FINDINGS: CT HEAD FINDINGS Brain: No change when compared to the previous head CT.  Generalized atrophy. Chronic small-vessel changes of the pons and hemispheric white matter. No sign of acute infarction by CT. No hemorrhage. No hydrocephalus or extra-axial collection. Vascular: Extensive atherosclerotic calcification of the major vessels at the base the brain. Skull: Negative Sinuses: Clear Orbits: Negative Review of the MIP images confirms the above findings CTA NECK FINDINGS Aortic arch: Atherosclerotic change without aneurysm or dissection. Right carotid system: Common carotid artery widely patent to the bifurcation region. Advanced calcified and  soft plaque at the carotid bifurcation and proximal ICA. Than what diameter of the proximal ICA is 1 mm or less, consistent with 90% or greater stenosis. Beyond that, vessel shows reduced caliber but does show antegrade flow. Left carotid system: Common carotid artery shows some atherosclerotic plaque but no stenosis. At the left carotid bifurcation, there is soft and calcified plaque. At the distal bulb, minimal diameter is 3.5 mm. Compared to a more distal cervical ICA diameter of 5 mm, this indicates a 30% stenosis. Cervical ICA widely patent beyond that. Vertebral arteries: Vertebral artery origins are patent. Vertebral arteries are fairly small vessels but are patent through the cervical region to foramen magnum. Skeleton: No significant bone finding. 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 See prior report. Upper chest: New consolidation in the right upper lobe. Possible small pulmonary nodules. Superior mediastinal adenopathy. See prior report. Review of the MIP images confirms the above findings CTA HEAD FINDINGS Anterior circulation: Both internal carotid arteries are patent through the siphon regions with extensive atherosclerotic calcification. Stenosis in the siphon regions is estimated at 50-70% on each side. Supraclinoid internal carotid arteries  are patent. Flow is seen in both anterior and middle cerebral vessels without discernible missing branches. Posterior circulation: Severe calcified atherosclerotic disease of the V4 segments on each side with severe stenoses. Accurate measurement not possible, but stenosis is quite likely approaching 90% on each side. There is a proximal basilar stenosis estimated at 70%. Since the study of earlier today, there is occlusion of the distal basilar artery proximal to the superior cerebellar and the posterior cerebral arteries. These vessels do show flow, presumably indicating patent posterior communicating vessels. Venous sinuses: Patent and normal. Anatomic variants: None other significant. Delayed phase: No abnormal enhancement. Review of the MIP images confirms the above findings IMPRESSION: Since the study of earlier today, there are only 2 changes. There is new consolidation and volume loss in the right upper lobe. 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 Flow is seen distal to that however, presumably indicating patent posterior communicating arteries. The study was discussed at the time of performance with Dr. Leonel Ramsay. Electronically Signed   By: Nelson Chimes M.D.   On: 11/18/2017 13:41   Ct Angio Head W Or Wo Contrast  Result Date: 11/18/2017 CLINICAL DATA:  Stroke.  Suspect cerebral hemorrhage EXAM: CT ANGIOGRAPHY HEAD AND NECK TECHNIQUE: Multidetector CT imaging of the head and neck was performed using the standard protocol during bolus administration of intravenous contrast. Multiplanar CT image reconstructions and MIPs were obtained to evaluate the vascular anatomy. Carotid stenosis measurements (when applicable) are obtained utilizing NASCET criteria, using the distal internal carotid diameter as the denominator. CONTRAST:  37m ISOVUE-370 IOPAMIDOL (ISOVUE-370) INJECTION 76% COMPARISON:  CT head 11/18/2017 FINDINGS: CTA NECK FINDINGS Aortic arch: Atherosclerotic calcification aortic arch  without aneurysm or dissection. Right carotid system: Atherosclerotic calcification right carotid bifurcation and proximal internal carotid artery. Severe stenosis proximal right internal carotid artery. Accurate measurement difficult due to heavily calcified vessel but this appears to be greater than 90% diameter stenosis. Left carotid system: Mild atherosclerotic disease left carotid bifurcation without significant carotid stenosis. Vertebral arteries: Both vertebral arteries widely patent in the neck without significant stenosis. Severe stenosis distal vertebral artery bilaterally due to heavily calcified plaque. Skeleton: Negative Other neck: Left thyroid mass measures 2.5 x 4 cm with heterogeneous enhancement and coarse calcification, highly suspicious for carcinoma. Partially  calcified 10 mm lymph node in the left neck level 3 node possibly metastatic node. Partially calcified left level 3 node 9 mm axial image 111 also suspicious for metastatic disease. Upper chest: 6.6 mm right upper lobe nodule. 5.5 mm right upper lobe nodule posteriorly. Left lung apex clear. Review of the MIP images confirms the above findings CTA HEAD FINDINGS Anterior circulation: Extensive atherosclerotic calcification in the cavernous carotid bilaterally. There appears to be severe stenosis in the right cavernous carotid and moderate stenosis on the left. Suboptimal opacification of the intracranial arteries due to timing of the injection. Diffuse atherosclerotic disease with mild diffuse narrowing of the left middle cerebral artery. Moderate stenosis left middle cerebral artery branch supplying the temporal lobe. Right middle cerebral artery branches show mild disease. Anterior cerebral arteries patent bilaterally with diffuse disease. Posterior circulation: Heavily calcified plaque in severe stenosis distal vertebral artery bilaterally. Basilar artery diffusely diseased with mild stenosis. Posterior cerebral arteries patent  bilaterally without significant stenosis. Venous sinuses: Patent Anatomic variants: None Delayed phase: Not performed Review of the MIP images confirms the above findings IMPRESSION: 1. Severe intracranial and extracranial atherosclerotic disease. No emergent large vessel occlusion 2. Severe stenosis right carotid bifurcation due to calcified plaque, estimated 90% diameter stenosis. No significant left carotid stenosis. Severe stenosis cavernous carotid bilaterally. 3. Both vertebral arteries are patent in the neck with severe calcific stenosis V4 segment bilaterally. 4. 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 Small calcified lymph nodes left neck suspicious for metastatic disease 5. Small lung nodules on the right, possible metastatic disease. Recommend chest CT for further evaluation. 6. These results were called by telephone at the time of interpretation on 11/18/2017 at 10:00 am to Dr. Leonel Ramsay , who verbally acknowledged these results. Electronically Signed   By: Franchot Gallo M.D.   On: 11/18/2017 10:10   Ct Head Wo Contrast  Result Date: 11/24/2017 CLINICAL DATA:  Follow-up examination for stroke. EXAM: CT HEAD WITHOUT CONTRAST TECHNIQUE: Contiguous axial images were obtained from the base of the skull through the vertex without intravenous contrast. COMPARISON:  Prior CT from 11/19/2017 as well as earlier studies. FINDINGS: Brain: Normal expected interval evolution of previously identified pontine infarct. No evidence for hemorrhagic transformation or other complication. Adjacent basilar cisterns and fourth ventricle remain patent. No acute intracranial hemorrhage. No new large vessel territory infarct. No extra-axial fluid collection. Ventricles stable in size without hydrocephalus. Vascular: Vertebrobasilar stent in place, stable. Calcified atherosclerotic change at the skull base again noted. No new hyperdense vessel. Skull: Scalp soft tissues and calvarium within normal  limits. Sinuses/Orbits: Globes and orbital soft tissues normal. Moderate mucosal thickening throughout the paranasal sinuses. Fluid within the partially visualized nasopharynx. Bilateral mastoid effusions. Patient likely intubated. Other: None. IMPRESSION: 1. Continued normal expected interval evolution of brainstem infarct. No evidence for hemorrhagic transformation or other complication. 2. No other new intracranial abnormality. 3. Moderate paranasal sinus disease with bilateral mastoid effusions, likely related intubation. Electronically Signed   By: Jeannine Boga M.D.   On: 11/24/2017 15:21   Ct Head Wo Contrast  Result Date: 11/19/2017 CLINICAL DATA:  Severe stenosis of the distal vertebral arteries. Proximal basilar artery stenosis. Distal basilar artery occlusion. Pontine infarct. EXAM: CT HEAD WITHOUT CONTRAST TECHNIQUE: Contiguous axial images were obtained from the base of the skull through the vertex without intravenous contrast. COMPARISON:  MRI brain 11/19/2017 12:49 a.m.  Are FINDINGS: Brain: Hypoattenuating area associated with the pontine infarct is again seen. There is  no interval hemorrhage. Brainstem and cerebellum are otherwise normal. No supratentorial infarct is present. No acute hemorrhage or mass lesion is present. Vascular: Left distal vertebral artery stent extends into the basilar artery. Atherosclerotic calcifications are present at the dural margin of both vertebral arteries and within the cavernous internal carotid arteries. There is no hyperdense vessel. Skull: Calvarium is intact. No focal lytic or blastic lesions are present. Sinuses/Orbits: Diffuse mucosal thickening is present throughout the ethmoid air cells. Fluid levels are present in the sphenoid sinuses, frontal sinuses, and maxillary sinuses. The mastoid air cells are clear. Bilateral exophthalmos is stable. Globes and orbits are otherwise within normal limits. IMPRESSION: 1. Expected evolution of brainstem infarct  without hemorrhage. 2. No new infarcts. 3. Left vertebral artery and basilar artery stent. 4. Acute on chronic sinus disease may be related to intubation. Electronically Signed   By: San Morelle M.D.   On: 11/19/2017 11:40   Ct Soft Tissue Neck W Contrast  Result Date: 11/26/2017 CLINICAL DATA:  Palpable neck nodule versus thyroid enlargement. Pre biopsy evaluation. EXAM: CT NECK WITH CONTRAST TECHNIQUE: Multidetector CT imaging of the neck was performed using the standard protocol following the bolus administration of intravenous contrast. CONTRAST:  110m OMNIPAQUE IOHEXOL 300 MG/ML  SOLN COMPARISON:  CT angiography 11/18/2017 FINDINGS: Pharynx and larynx: Endotracheal tube and orogastric tube in place. No mucosal lesion identified. Salivary glands: Parotid and submandibular glands are normal. Thyroid: As shown previously, there is a mass arising from the inferior thyroid on the left measuring approximately 5 x 3.4 x 5.5 cm with irregular margins and calcifications. There appears to be invasion of the superior mediastinum. Tumor partially encases the trachea and has a broad surface along the esophagus. Lymph nodes: Small calcified lower cervical lymph nodes are present. Supraclavicular nodes and superior mediastinal nodes are present as well, worrisome for extensive regional metastatic disease. Vascular: Carotid and vertebrobasilar atherosclerosis as seen previously. Recent placement of a basilar stent. Limited intracranial: No discernible acute intracranial finding. Visualized orbits: Negative Mastoids and visualized paranasal sinuses: Mucosal inflammatory changes of the paranasal sinuses. Skeleton: Ordinary cervical spondylosis. Upper chest: Bilateral layering pleural effusions with dependent atelectasis. Lung detail is limited. Other: None IMPRESSION: Aside from the fact that the patient shows endotracheal intubation and the presence of an orogastric tube, and aside from development of bilateral  layering pleural effusions with dependent atelectasis, the findings are unchanged since the previous studies. Irregular mass arising from the inferior left thyroid lobe, measuring approximately 5 x 3.4 x 5.5 cm, with invasion of the surrounding tissues worrisome for thyroid carcinoma. Partial encasement of the trachea. Broad surface along the esophagus. Small but likely pathologic nodes in the lower neck and superior mediastinum, some with calcification, probably involved by metastatic disease. Electronically Signed   By: MNelson ChimesM.D.   On: 11/26/2017 11:35   Ct Angio Neck W Or Wo Contrast  Result Date: 11/18/2017 CLINICAL DATA:  Clinical deterioration. Concern regarding basilar thrombosis. EXAM: CT ANGIOGRAPHY HEAD AND NECK TECHNIQUE: Multidetector CT imaging of the head and neck was performed using the standard protocol during bolus administration of intravenous contrast. Multiplanar CT image reconstructions and MIPs were obtained to evaluate the vascular anatomy. Carotid stenosis measurements (when applicable) are obtained utilizing NASCET criteria, using the distal internal carotid diameter as the denominator. CONTRAST:  1076mISOVUE-370 IOPAMIDOL (ISOVUE-370) INJECTION 76% COMPARISON:  Earlier same day. FINDINGS: CT HEAD FINDINGS Brain: No change when compared to the previous head CT. Generalized atrophy. Chronic small-vessel changes of  the pons and hemispheric white matter. No sign of acute infarction by CT. No hemorrhage. No hydrocephalus or extra-axial collection. Vascular: Extensive atherosclerotic calcification of the major vessels at the base the brain. Skull: Negative Sinuses: Clear Orbits: Negative Review of the MIP images confirms the above findings CTA NECK FINDINGS Aortic arch: Atherosclerotic change without aneurysm or dissection. Right carotid system: Common carotid artery widely patent to the bifurcation region. Advanced calcified and soft plaque at the carotid bifurcation and proximal  ICA. Than what diameter of the proximal ICA is 1 mm or less, consistent with 90% or greater stenosis. Beyond that, vessel shows reduced caliber but does show antegrade flow. Left carotid system: Common carotid artery shows some atherosclerotic plaque but no stenosis. At the left carotid bifurcation, there is soft and calcified plaque. At the distal bulb, minimal diameter is 3.5 mm. Compared to a more distal cervical ICA diameter of 5 mm, this indicates a 30% stenosis. Cervical ICA widely patent beyond that. Vertebral arteries: Vertebral artery origins are patent. Vertebral arteries are fairly small vessels but are patent through the cervical region to foramen magnum. Skeleton: No significant bone finding. 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 See prior report. Upper chest: New consolidation in the right upper lobe. Possible small pulmonary nodules. Superior mediastinal adenopathy. See prior report. Review of the MIP images confirms the above findings CTA HEAD FINDINGS Anterior circulation: Both internal carotid arteries are patent through the siphon regions with extensive atherosclerotic calcification. Stenosis in the siphon regions is estimated at 50-70% on each side. Supraclinoid internal carotid arteries are patent. Flow is seen in both anterior and middle cerebral vessels without discernible missing branches. Posterior circulation: Severe calcified atherosclerotic disease of the V4 segments on each side with severe stenoses. Accurate measurement not possible, but stenosis is quite likely approaching 90% on each side. There is a proximal basilar stenosis estimated at 70%. Since the study of earlier today, there is occlusion of the distal basilar artery proximal to the superior cerebellar and the posterior cerebral arteries. These vessels do show flow, presumably indicating patent posterior communicating vessels. Venous  sinuses: Patent and normal. Anatomic variants: None other significant. Delayed phase: No abnormal enhancement. Review of the MIP images confirms the above findings IMPRESSION: Since the study of earlier today, there are only 2 changes. There is new consolidation and volume loss in the right upper lobe. 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 Flow is seen distal to that however, presumably indicating patent posterior communicating arteries. The study was discussed at the time of performance with Dr. Leonel Ramsay. Electronically Signed   By: Nelson Chimes M.D.   On: 11/18/2017 13:41   Ct Angio Neck W Or Wo Contrast  Result Date: 11/18/2017 CLINICAL DATA:  Stroke.  Suspect cerebral hemorrhage EXAM: CT ANGIOGRAPHY HEAD AND NECK TECHNIQUE: Multidetector CT imaging of the head and neck was performed using the standard protocol during bolus administration of intravenous contrast. Multiplanar CT image reconstructions and MIPs were obtained to evaluate the vascular anatomy. Carotid stenosis measurements (when applicable) are obtained utilizing NASCET criteria, using the distal internal carotid diameter as the denominator. CONTRAST:  54m ISOVUE-370 IOPAMIDOL (ISOVUE-370) INJECTION 76% COMPARISON:  CT head 11/18/2017 FINDINGS: CTA NECK FINDINGS Aortic arch: Atherosclerotic calcification aortic arch without aneurysm or dissection. Right carotid system: Atherosclerotic calcification right carotid bifurcation and proximal internal carotid artery. Severe stenosis proximal right internal carotid artery. Accurate measurement difficult due  to heavily calcified vessel but this appears to be greater than 90% diameter stenosis. Left carotid system: Mild atherosclerotic disease left carotid bifurcation without significant carotid stenosis. Vertebral arteries: Both vertebral arteries widely patent in the neck without significant stenosis. Severe stenosis distal vertebral artery bilaterally due to heavily calcified plaque.  Skeleton: Negative Other neck: Left thyroid mass measures 2.5 x 4 cm with heterogeneous enhancement and coarse calcification, highly suspicious for carcinoma. Partially calcified 10 mm lymph node in the left neck level 3 node possibly metastatic node. Partially calcified left level 3 node 9 mm axial image 111 also suspicious for metastatic disease. Upper chest: 6.6 mm right upper lobe nodule. 5.5 mm right upper lobe nodule posteriorly. Left lung apex clear. Review of the MIP images confirms the above findings CTA HEAD FINDINGS Anterior circulation: Extensive atherosclerotic calcification in the cavernous carotid bilaterally. There appears to be severe stenosis in the right cavernous carotid and moderate stenosis on the left. Suboptimal opacification of the intracranial arteries due to timing of the injection. Diffuse atherosclerotic disease with mild diffuse narrowing of the left middle cerebral artery. Moderate stenosis left middle cerebral artery branch supplying the temporal lobe. Right middle cerebral artery branches show mild disease. Anterior cerebral arteries patent bilaterally with diffuse disease. Posterior circulation: Heavily calcified plaque in severe stenosis distal vertebral artery bilaterally. Basilar artery diffusely diseased with mild stenosis. Posterior cerebral arteries patent bilaterally without significant stenosis. Venous sinuses: Patent Anatomic variants: None Delayed phase: Not performed Review of the MIP images confirms the above findings IMPRESSION: 1. Severe intracranial and extracranial atherosclerotic disease. No emergent large vessel occlusion 2. Severe stenosis right carotid bifurcation due to calcified plaque, estimated 90% diameter stenosis. No significant left carotid stenosis. Severe stenosis cavernous carotid bilaterally. 3. Both vertebral arteries are patent in the neck with severe calcific stenosis V4 segment bilaterally. 4. 2.5 x 4 cm left thyroid mass highly suspicious for  carcinoma. Small calcified lymph nodes left neck suspicious for metastatic disease 5. Small lung nodules on the right, possible metastatic disease. Recommend chest CT for further evaluation. 6. These results were called by telephone at the time of interpretation on 11/18/2017 at 10:00 am to Dr. Leonel Ramsay , who verbally acknowledged these results. Electronically Signed   By: Franchot Gallo M.D.   On: 11/18/2017 10:10   Ct Chest W Contrast  Result Date: 11/18/2017 CLINICAL DATA:  Unable to follow commands. EXAM: CT CHEST WITH CONTRAST TECHNIQUE: Multidetector CT imaging of the chest was performed during intravenous contrast administration. CONTRAST:  146m ISOVUE-370 IOPAMIDOL (ISOVUE-370) INJECTION 76% COMPARISON:  Radiograph of same day.  CT scan of August 23, 2016. FINDINGS: Cardiovascular: Atherosclerosis of thoracic aorta is noted without aneurysm formation. Coronary artery calcifications are noted. No pericardial effusion is noted. Mild cardiomegaly is noted. Mediastinum/Nodes: Endotracheal and nasogastric tubes are in grossly good position. Stable 1 cm lymph node seen in aortopulmonary window. Stable 1.3 cm right paratracheal lymph node. Stable 1 cm right subcarinal lymph node. Bilateral calcified thyroid nodules are again noted. The largest measures 2.8 cm on the left. Lungs/Pleura: No pneumothorax or significant pleural effusion is noted. Right upper lobe airspace opacity with air bronchograms is noted concerning for pneumonia or atelectasis. Minimal bibasilar subsegmental atelectasis is noted. Stable 8 mm nodule seen in lingular segment of left upper lobe. Upper Abdomen: No acute abnormality. Musculoskeletal: No chest wall abnormality. No acute or significant osseous findings. IMPRESSION: Right upper lobe airspace opacity is noted with air bronchograms concerning for pneumonia or possibly atelectasis. Stable mediastinal  adenopathy is noted compared to prior exam. Endotracheal and nasogastric tubes are in  grossly good position. Coronary artery calcifications are noted suggesting coronary artery disease. Possible 2.8 cm left thyroid nodule. Thyroid ultrasound is recommended for further evaluation. Aortic Atherosclerosis (ICD10-I70.0). Electronically Signed   By: Marijo Conception, M.D.   On: 11/18/2017 13:47   Mr Brain Wo Contrast  Result Date: 11/19/2017 CLINICAL DATA:  62 y/o M; findings of stroke with progression of symptoms post basilar intervention. EXAM: MRI HEAD WITHOUT CONTRAST TECHNIQUE: Multiplanar, multiecho pulse sequences of the brain and surrounding structures were obtained without intravenous contrast. COMPARISON:  11/18/2017 CTA and CT of the head. 11/18/2017 cerebral angiography. FINDINGS: Brain: Reduced diffusion compatible with acute/early subacute infarction within the central pons measuring 2.4 x 2.6 x 1.1 cm (volume = 3.6 cm^3)(AP x ML x CC series 5, image 58 and series 7, image 56). The region of reduced diffusion is involves both sides of the pons, greater on the right. There additional punctate foci of infarction within the left cerebellar hemisphere (series 5, image 53) and the right parietal cortex (series 5, image 77). No associated hemorrhage or significant mass effect. Minimal chronic microvascular ischemic changes of the brain and mild brain parenchymal volume loss. Single punctate focus of susceptibility hypointensity within the right cerebellar hemisphere compatible with hemosiderin deposition of chronic microhemorrhage. No extra-axial collection, hydrocephalus, or herniation. Vascular: Normal flow voids. Skull and upper cervical spine: Normal marrow signal. Sinuses/Orbits: Diffuse paranasal sinus mucosal thickening and fluid levels likely due to intubation. Orbits are unremarkable. Other: None. IMPRESSION: 1. 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 No associated hemorrhage or significant mass  effect. 2. Additional punctate acute/early subacute infarctions within left lateral cerebellum and right parietal cortex. 3. Minimal chronic microvascular ischemic changes of the brain and mild parenchymal volume loss. These results will be called to the ordering clinician or representative by the Radiologist Assistant, and communication documented in the PACS or zVision Dashboard. Electronically Signed   By: Kristine Garbe M.D.   On: 11/19/2017 01:37   Ir Intra Cran Stent  Result Date: 11/22/2017 INDICATION: Acute deterioration and level of consciousness. 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 EXAM: 1. EMERGENT LARGE VESSEL OCCLUSION THROMBOLYSIS (POSTERIOR CIRCULATION) COMPARISON:  CT angiogram of the head and neck of 11/19/2017. MEDICATIONS: 3 g Ancef IV antibiotic was administered within 1 hour of the procedure. ANESTHESIA/SEDATION: General anesthesia. CONTRAST:  Isovue 300 approximately 130 mL. FLUOROSCOPY TIME:  Fluoroscopy Time: 96 minutes 42 seconds (7834 mGy). COMPLICATIONS: None immediate. TECHNIQUE: Following a full explanation of the procedure along with the potential associated complications, an informed witnessed consent was obtained from the patient's spouse and children. The risks of intracranial hemorrhage of 10%, worsening neurological deficit, ventilator dependency, death and inability to revascularize were all reviewed in detail with the patient's family. The patient was then put under general anesthesia by the Department of Anesthesiology at [facility]. The right groin was prepped and draped in the usual sterile fashion. Thereafter using modified Seldinger technique, transfemoral access into the right common femoral artery was obtained without difficulty. Over a 0.035 inch Amplatz stiff guidewire an 8 French 55 cm neurovascular sheath was inserted. Through this, and also over a 0.035 inch guidewire a 5 [place] JB 1 catheter was advanced to the aortic  arch region and selectively positioned in the right subclavian artery, the right common carotid  artery, the left common carotid artery and the left vertebral artery. FINDINGS: The right common carotid arteriogram demonstrates approximately 70% stenosis of the right external carotid artery at its origin. Its branches are, otherwise, normally opacified. The right internal carotid artery just distal to the bulb has a severe 90% plus stenosis. Distal to this there is a double U- shaped tortuosity of the proximal right internal carotid artery. Distal to this the right internal carotid artery assumes a normal course to the cranial skull base. The petrous, the cavernous and the supraclinoid segments are widely patent. The right middle cerebral artery and the right anterior cerebral artery opacify into the capillary and venous phases. Cross-filling via the anterior communicating artery of the left anterior cerebral artery A2 segment and distally is noted. Also seen is a small infundibulum at the origin of the right posterior communicating artery. The right subclavian arteriogram demonstrates the right vertebral artery origin to be widely patent. The vessel is seen to ascend unimpeded to the cranial skull base where it supplies the ipsilateral right posterior-inferior cerebellar artery. There is no opacification distal to this. The left common carotid arteriogram demonstrates the left external carotid artery origin to be narrowed by 50%. Its branches are normally opacified otherwise. The left internal carotid artery at the bulb demonstrates wide patency. There is a U-shaped tortuosity of the proximal 1/3 of the left internal carotid artery. More distally, the vessel is seen to opacify normally to the cranial skull base. The petrous segment is unremarkable. There is mild fusiform dilatation of the cavernous segment. The distal cavernous segment and the supraclinoid segments are widely patent. A left posterior communicating  artery is seen opacifying the left posterior cerebral artery distribution with retrograde partial opacification of the distal basilar artery and the superior cerebellar arteries. The left middle cerebral artery and the left anterior cerebral artery opacify into the capillary and venous phases. Cross-filling via the anterior communicating artery of the right anterior cerebral artery A2 segment is noted. The left vertebral artery origin demonstrates wide patency. The vessel is seen to opacify normally to the cranial skull base. Wide patency is seen of the left posterior-inferior cerebellar artery. Distal to this there is severe pre occlusive stenosis of the vertebrobasilar junction just proximal to the basilar artery. Distal to this there is another area of severe pre occlusive stenosis of the mid basilar artery just distal to the origin of the anterior-inferior cerebellar arteries. Distal to this the distal basilar artery, the right posterior cerebral artery opacify into the capillary and venous phases. 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 The diagnostic JB 1 catheter in the distal left subclavian artery was exchanged over a 0.035 inch 300 cm Rosen exchange guidewire for a 6 French 80 cm Cook shuttle sheath using biplane roadmap technique and constant fluoroscopic guidance. Good aspiration obtained from the hub of the [facility] shuttle sheath. A gentle contrast injection demonstrated no evidence of spasms, dissections or of intraluminal filling defects. Over a 0.035 inch Roadrunner guidewire, a 115 cm 5 [place] Navien guide catheter was advanced to the origin of the left vertebral artery. The guidewire was then advanced distally into the left vertebral artery to the cranial skull base followed by the Navien guide catheter. At this time the [facility] shuttle sheath was also advanced into the proximal 1/3 of the  left vertebral artery. The guidewire was removed from the 5 [place] Navien guide catheter. Good aspiration obtained from  the hub of the Navien guide catheter. Gentle contrast injection demonstrated no evidence of spasms, dissections or of intraluminal filling defects. A control arteriogram was then performed from the 5 [place] Navien guide catheter in the left vertebrobasilar junction proximal to the origin of the left posterior-inferior cerebellar artery. This confirmed the areas of severe stenosis as described earlier. Measurements were then performed of the basilar artery distally and also in the mid segment proximal to the anterior-inferior cerebellar artery, and also proximal to the proximal area of severe stenosis. It was decided to primarily revascularize both these areas with a single stent. To this effect, over a 0.014 inch Softip Synchro micro guidewire, an 021 Trevo ProVue microcatheter was advanced to the distal tip of the Navien guide catheter in the left vertebrobasilar junction. With the micro guidewire leading with a J-tip configuration to avoid dissections or inducing spasm, manipulating the guidewire was performed with a torque device across the two areas of severe stenosis and into the right posterior cerebral artery P1 P2 segment. This was then followed by the microcatheter. The guidewire was removed. Good aspiration obtained from the hub of the 021 microcatheter which was now in the right posterior cerebral artery P1 segment. A gentle control arteriogram demonstrated good antegrade flow distally. This was then connected to continuous heparinized saline infusion. It was decided to use a [employer] vascular reconstruction device measuring 4 mm x 39 mm. This was then prepped and purged with heparinized saline infusion. In a coaxial manner and with constant heparinized saline infusion this was advanced to the distal end of the microcatheter. The O ring on the delivery microcatheter was then  loosened. With slight forward gentle traction with the right hand on the delivery micro guidewire, with left hand the delivery microcatheter was unsheathing the distal portion of the device in the distal basilar artery followed by the proximal portion which was delivered proximal to the proximal severe focal area of stenosis. Under constant fluoroscopic guidance, the delivery microcatheter and the guidewire were retrieved and removed. Control arteriograms were then performed at 15, 30 and 60 minutes post deployment of the endovascular stent. These continued to demonstrate excellent flow through the stented segments of the distal basilar artery and the proximal basilar artery/left vertebrobasilar junction. The distal portion was noted to be patent by 90%. The proximal stented segment appeared to be patent by 30-40% with good antegrade flow. During this time, multiple attempts were made to advance a Gateway balloon microcatheter over a 0.014 inch micro guidewire in order to angioplasty more proximal side. However, each time there was movement of the stent noted. Because of this, it was decided to leave the proximal lesion without angioplasty at 30-40% patency. Excellent perfusion was maintained in the left vertebrobasilar junction. No other areas of occlusions were seen. The superior cerebellar arteries and the anterior-inferior cerebellar arteries appeared widely patent. The 5 [place] Navien guide catheter and the [employer] sheath were then retrieved and removed. The 55 cm 8 French neurovascular sheath were left in situ and connected to continuous heparinized saline infusion. Because the patient had received IV tPA it was decided to leave the neurovascular sheath. The right groin was soft without evidence of a hemorrhage. Throughout the procedure, the patient's blood pressure and neurological status remained stable. No angiographic evidence of extravasation or of intraluminal filling defects or mass-effect was  noted. A Dyna CT of the head performed on the table revealed no evidence of hemorrhage, mass effect or of intraventricular blood or subarachnoid  hemorrhage. The patient was then transported to the neuro ICU intubated for further medical and stroke management. Prior to the placement of the stent to treat the two areas of severe stenosis, the patient was loaded with 180 mg of Brilinta, and 85 mg of aspirin via an orogastric tube. Also during procedure, the patient received a total of 4.5 mg of super selective intracranial intra-arterial Integrilin in order to overcome the possibly of intra stent thrombus formation. 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 PLAN: Patient transferred to the ICU for further medical management. Electronically Signed   By: Luanne Bras M.D.   On: 11/19/2017 15:04   Ir US Guide Bx Asp/drain  Result Date: 11/29/2017 INDICATION: Concern for metastatic thyroid cancer. Please perform ultrasound-guided right cervical lymph node biopsy for tissue diagnostic purposes. EXAM: ULTRASOUND-GUIDED RIGHT CERVICAL LYMPH NODE BIOPSY COMPARISON:  Neck CT - 11/26/2017 MEDICATIONS: None ANESTHESIA/SEDATION: None COMPLICATIONS: None immediate. TECHNIQUE: Informed written consent was obtained from patient's family after a discussion of the risks, benefits and alternatives to treatment. Questions regarding the procedure were encouraged and answered. Initial ultrasound scanning demonstrated and approximately 1.7 x 1.6 cm enlarged right cervical lymph node correlating with the  dominant right cervical lymph node seen on preceding neck CT image 66, series 3. An ultrasound image was saved for documentation purposes. The procedure was planned. A timeout was performed prior to the initiation of the procedure. The operative was prepped and draped in the usual sterile fashion, and a sterile drape was applied covering the operative field. A timeout was performed prior to the initiation of the procedure. Local anesthesia was provided with 1% lidocaine with epinephrine. Under direct ultrasound guidance, an 18 gauge core needle device was utilized to obtain to obtain 5 core needle biopsies of the indeterminate right cervical lymph node. The samples were placed in saline and submitted to pathology. The needle was removed and hemostasis was achieved with manual compression. Post procedure scan was negative for significant hematoma. A dressing was placed. The patient tolerated the procedure well without immediate postprocedural complication. IMPRESSION: Technically successful ultrasound guided biopsy of dominant indeterminate right cervical lymph node. Electronically Signed   By: Sandi Mariscal M.D.   On: 11/29/2017 15:12   Dg Chest Port 1 View  Result Date: 12/01/2017 CLINICAL DATA:  62 year old male status post brainstem infarct last month. Respiratory failure. EXAM: PORTABLE CHEST 1 VIEW COMPARISON:  11/30/2017 and earlier. FINDINGS: Portable AP semi upright view at 0547 hours. Stable endotracheal tube tip between the clavicles and carina. Enteric tube courses to the left abdomen, tip not included. Stable right PICC line. Stable cardiomegaly and mediastinal contours. Stable dense retrocardiac opacity. Stable pulmonary vascularity without pulmonary edema. No pneumothorax. Small layering pleural effusions demonstrated on recent neck CT 11/26/2017. Paucity of bowel gas in the upper abdomen. IMPRESSION: 1.  Stable lines and tubes. 2. Stable ventilation with cardiomegaly, lower lobe collapse or  consolidation, and small pleural effusions. Electronically Signed   By: Genevie Ann M.D.   On: 12/01/2017 07:22   Dg Chest Port 1 View  Result Date: 11/30/2017 CLINICAL DATA:  62 year old male with a history of acute respiratory failure EXAM: PORTABLE  CHEST 1 VIEW COMPARISON:  11/29/2017, 11/27/2017 FINDINGS: Cardiomediastinal silhouette unchanged in size and contour. Fullness in the central vasculature. Low lung volumes. Opacities at the bilateral lung bases with blunting of the costophrenic angles and obscuration of the retrocardiac region on the left hemidiaphragm. Interlobular septal thickening. Overall aeration appears slightly improved at the right base. Interval placement of right upper extremity PICC, with the catheter appearing to terminate in the superior vena cava. Unchanged endotracheal tube terminating at the level of the clavicular heads. Gastric tube unchanged terminating out of the field of view. IMPRESSION: Similar appearance of the chest x-ray with mild improvement in aeration at the right base, with mixed interstitial and airspace disease and likely small pleural effusions. Unchanged endotracheal tube, gastric tube. Interval placement of right upper extremity PICC. Electronically Signed   By: Corrie Mckusick D.O.   On: 11/30/2017 08:42   Dg Chest Port 1 View  Result Date: 11/29/2017 CLINICAL DATA:  Hypoxia EXAM: PORTABLE CHEST 1 VIEW COMPARISON:  November 27, 2017 FINDINGS: Endotracheal tube tip is 6.0 cm above the carina. Nasogastric tube tip and side port are below the diaphragm. Central catheter tip is in the superior vena cava. No pneumothorax. There is moderate interstitial pulmonary edema with small pleural effusions. There is cardiomegaly with pulmonary venous hypertension. There is no appreciable airspace consolidation. No evident adenopathy. No bone lesions. IMPRESSION: Tube and catheter positions as described without pneumothorax. There is pulmonary vascular congestion with interstitial edema  and small pleural effusions. Suspect a degree of underlying congestive heart failure. No airspace consolidation. Electronically Signed   By: Lowella Grip III M.D.   On: 11/29/2017 07:06   Dg Chest Port 1 View  Result Date: 11/27/2017 CLINICAL DATA:  Hypoxia EXAM: PORTABLE CHEST 1 VIEW COMPARISON:  November 25, 2017 FINDINGS: Endotracheal tube tip is 7.1 cm above the carina. Nasogastric tube tip and side port are below the diaphragm. Central catheter tip is in the superior vena cava. No pneumothorax. There is slight interstitial pulmonary edema. No consolidation. There are small pleural effusions bilaterally. There is cardiomegaly with pulmonary venous hypertension. IMPRESSION: Tube and catheter positions as described without pneumothorax. There is pulmonary vascular congestion with slight interstitial edema and small pleural effusions bilaterally. No frank consolidation. Cardiac silhouette stable. Electronically Signed   By: Lowella Grip III M.D.   On: 11/27/2017 07:17   Dg Chest Port 1 View  Result Date: 11/25/2017 CLINICAL DATA:  Intubation EXAM: PORTABLE CHEST 1 VIEW COMPARISON:  11/23/2017 FINDINGS: Endotracheal tube and nasogastric catheter are noted in satisfactory position. Right-sided PICC line is again seen in satisfactory position. Patient is significantly rotated to the right accentuating the mediastinal markings. Mild vascular congestion is noted without focal confluent infiltrate. IMPRESSION: Mild vascular congestion. Tubes and lines as described. Electronically Signed   By: Inez Catalina M.D.   On: 11/25/2017 07:56   Dg Chest Port 1 View  Result Date: 11/23/2017 CLINICAL DATA:  62 year old male with fever.  Subsequent encounter. EXAM: PORTABLE CHEST 1 VIEW COMPARISON:  11/22/2017 chest x-ray. FINDINGS: Endotracheal tube tip 7 cm above the carina impressing upon right lateral wall. Nasogastric tube courses below the diaphragm. Tip is not included on the present exam. Right PICC line tip  distal superior vena cava level. Cardiomegaly. Pulmonary vascular congestion. Small pleural effusions. Findings may represent component of mild pulmonary edema. Bibasilar consolidation progressive in the right lower lobe may represent infiltrate or atelectasis. IMPRESSION: Progression of bibasilar consolidation greater on the right which may represent infiltrate or  atelectasis. Cardiomegaly and mild pulmonary edema similar to prior exam. Electronically Signed   By: Genia Del M.D.   On: 11/23/2017 10:33   Dg Chest Port 1 View  Result Date: 11/22/2017 CLINICAL DATA:  62 year old male status post PICC line placement. EXAM: PORTABLE CHEST 1 VIEW COMPARISON:  0006 hours today and earlier. FINDINGS: Portable AP semi upright view at 1320 hours. Right side PICC line is in place. The tip is partially obscured by external EKG leads but likely at the cavoatrial junction level. Stable cardiac size and mediastinal contours. Stable endotracheal tube tip just below the clavicles. Enteric tube courses to the abdomen, tip not included. Mildly larger lung volumes and improved bibasilar ventilation. No new pulmonary process. IMPRESSION: Right PICC line in place, tip projects at the cavoatrial junction. Mildly improved ventilation. Electronically Signed   By: Genevie Ann M.D.   On: 11/22/2017 13:34   Dg Chest Port 1 View  Result Date: 11/22/2017 CLINICAL DATA:  62 year old male with hypoxia. EXAM: PORTABLE CHEST 1 VIEW COMPARISON:  Chest radiograph dated 11/21/2017 FINDINGS: Endotracheal tube with tip approximately 5.6 cm above the carina. Enteric tube extends below the diaphragm with tip beyond the inferior margin of the image. There is cardiomegaly with increased vascular and interstitial prominence and probable small pleural effusions most consistent with CHF. Superimposed pneumonia is not excluded. Clinical correlation is recommended. There is no pneumothorax. Overall no significant change in the degree of congestion compared  to the prior radiograph. No acute osseous pathology. IMPRESSION: Cardiomegaly with CHF and no significant interval change. Stable positioning of the support devices. Electronically Signed   By: Anner Crete M.D.   On: 11/22/2017 00:32   Dg Chest Port 1 View  Result Date: 11/21/2017 CLINICAL DATA:  Acute respiratory failure EXAM: PORTABLE CHEST 1 VIEW COMPARISON:  11/19/2017 FINDINGS: Cardiac shadow remains enlarged. Endotracheal tube and nasogastric catheter are again seen and stable. Lungs are well aerated bilaterally. Diffuse interstitial changes are noted consistent with some mild vascular congestion. No definitive interstitial edema is noted. No focal infiltrate or pneumothorax is seen. No bony abnormality is noted. IMPRESSION: Mild increasing congestive failure when compare with the prior exam. Electronically Signed   By: Inez Catalina M.D.   On: 11/21/2017 14:07   Dg Chest Port 1 View  Result Date: 11/19/2017 CLINICAL DATA:  Evaluate endotracheal tube.  Stroke. EXAM: PORTABLE CHEST 1 VIEW COMPARISON:  Chest radiograph 11/18/2017 FINDINGS: Endotracheal tube is at the level of the clavicular heads and approximately 6.5 cm above the carina. Streaky densities in the lower chest most likely associated with atelectasis. Overall, slightly improved aeration in the lungs. Heart size is upper limits of normal. Nasogastric tube extends into the abdomen. Negative for a pneumothorax. IMPRESSION: Stable appearance of the endotracheal tube. Nasogastric tube extends into the abdomen. Slightly improved aeration in lungs may represent decreasing atelectasis or edema. Electronically Signed   By: Markus Daft M.D.   On: 11/19/2017 08:59   Dg Chest Port 1 View  Result Date: 11/18/2017 CLINICAL DATA:  Stroke symptoms. EXAM: PORTABLE CHEST 1 VIEW COMPARISON:  01/15/2017. FINDINGS: Endotracheal tube noted with tip 5 cm above the carina. NG tube noted with tip below left hemidiaphragm. Cardiomegaly with pulmonary vascular  congestion and bilateral interstitial prominence suggesting CHF. No pleural effusion or pneumothorax. IMPRESSION: 1. Endotracheal tube tip noted 5 cm above the carina. NG tube tip noted below left hemidiaphragm. 2. Cardiomegaly with pulmonary venous congestion bilateral interstitial prominence suggesting mild CHF. Electronically Signed  By: [place]   On: 11/18/2017 11:20   Dg Abd Portable 1v  Result Date: 11/20/2017 CLINICAL DATA:  Evaluate OG tube placement EXAM: PORTABLE ABDOMEN - 1 VIEW COMPARISON:  None. FINDINGS: The OG tube terminates in the region of the gastric body. IMPRESSION: The OG tube terminates in the region of the gastric body. Electronically Signed   By: Dorise Bullion III M.D   On: 11/20/2017 18:51   Ct Head Code Stroke Wo Contrast  Result Date: 11/18/2017 CLINICAL DATA:  Code stroke.  Left-sided deficit, slurred speech EXAM: CT HEAD WITHOUT CONTRAST TECHNIQUE: Contiguous axial images were obtained from the base of the skull through the vertex without intravenous contrast. COMPARISON:  None. FINDINGS: Brain: No evidence of acute infarction, hemorrhage, hydrocephalus, extra-axial collection or mass lesion/mass effect. Vascular: Negative for hyperdense vessel. Calcification in distal vertebral and carotid arteries is advanced bilaterally Skull: Negative Sinuses/Orbits: Negative Other: None ASPECTS ([place] Stroke Program Early CT Score) - Ganglionic level infarction (caudate, lentiform nuclei, internal capsule, insula, M1-M3 cortex): 7 - Supraganglionic infarction (M4-M6 cortex): 3 Total score (0-10 with 10 being normal): 10 IMPRESSION: 1. No acute intracranial abnormality.  Atherosclerotic calcification 2. ASPECTS is 10 Electronically Signed   By: Franchot Gallo M.D.   On: 11/18/2017 09:54   [place] Ekg Site Rite  Result Date: 11/22/2017 If Site Rite image not attached, placement could not be confirmed due to current cardiac rhythm.  Ir Angio Extracran Sel Com Carotid Innominate  Uni Bilat Mod Sed  Result Date: 11/22/2017 INDICATION: Acute deterioration and level of consciousness. 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 EXAM: 1. EMERGENT LARGE VESSEL OCCLUSION THROMBOLYSIS (POSTERIOR CIRCULATION) COMPARISON:  CT angiogram of the head and neck of 11/19/2017. MEDICATIONS: 3 g Ancef IV antibiotic was administered within 1 hour of the procedure. ANESTHESIA/SEDATION: General anesthesia. CONTRAST:  Isovue 300 approximately 130 mL. FLUOROSCOPY TIME:  Fluoroscopy Time: 96 minutes 42 seconds (7834 mGy). COMPLICATIONS: None immediate. TECHNIQUE: Following a full explanation of the procedure along with the potential associated complications, an informed witnessed consent was obtained from the patient's spouse and children. The risks of intracranial hemorrhage of 10%, worsening neurological deficit, ventilator dependency, death and inability to revascularize were all reviewed in detail with the patient's family. The patient was then put under general anesthesia by the Department of Anesthesiology at [facility]. The right groin was prepped and draped in the usual sterile fashion. Thereafter using modified Seldinger technique, transfemoral access into the right common femoral artery was obtained without difficulty. Over a 0.035 inch Amplatz stiff guidewire an 8 French 55 cm neurovascular sheath was inserted. Through this, and also over a 0.035 inch guidewire a 5 [place] JB 1 catheter was advanced to the aortic arch region and selectively positioned in the right subclavian artery, the right common carotid artery, the left common carotid artery and the left vertebral artery. FINDINGS: The right common carotid arteriogram demonstrates approximately 70% stenosis of the right external carotid artery at its origin. Its branches are, otherwise, normally opacified. The right internal carotid artery just distal to the bulb has a severe 90% plus stenosis. Distal to this there is a  double U- shaped tortuosity of the proximal right internal carotid artery. Distal to this the right internal carotid artery assumes a normal course to the cranial skull base. The petrous, the cavernous and the supraclinoid segments are widely patent. The right middle cerebral artery and the right anterior cerebral artery opacify into the capillary and  venous phases. Cross-filling via the anterior communicating artery of the left anterior cerebral artery A2 segment and distally is noted. Also seen is a small infundibulum at the origin of the right posterior communicating artery. The right subclavian arteriogram demonstrates the right vertebral artery origin to be widely patent. The vessel is seen to ascend unimpeded to the cranial skull base where it supplies the ipsilateral right posterior-inferior cerebellar artery. There is no opacification distal to this. The left common carotid arteriogram demonstrates the left external carotid artery origin to be narrowed by 50%. Its branches are normally opacified otherwise. The left internal carotid artery at the bulb demonstrates wide patency. There is a U-shaped tortuosity of the proximal 1/3 of the left internal carotid artery. More distally, the vessel is seen to opacify normally to the cranial skull base. The petrous segment is unremarkable. There is mild fusiform dilatation of the cavernous segment. The distal cavernous segment and the supraclinoid segments are widely patent. A left posterior communicating artery is seen opacifying the left posterior cerebral artery distribution with retrograde partial opacification of the distal basilar artery and the superior cerebellar arteries. The left middle cerebral artery and the left anterior cerebral artery opacify into the capillary and venous phases. Cross-filling via the anterior communicating artery of the right anterior cerebral artery A2 segment is noted. The left vertebral artery origin demonstrates wide patency. The  vessel is seen to opacify normally to the cranial skull base. Wide patency is seen of the left posterior-inferior cerebellar artery. Distal to this there is severe pre occlusive stenosis of the vertebrobasilar junction just proximal to the basilar artery. Distal to this there is another area of severe pre occlusive stenosis of the mid basilar artery just distal to the origin of the anterior-inferior cerebellar arteries. Distal to this the distal basilar artery, the right posterior cerebral artery opacify into the capillary and venous phases. 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 The diagnostic JB 1 catheter in the distal left subclavian artery was exchanged over a 0.035 inch 300 cm Rosen exchange guidewire for a 6 French 80 cm Cook shuttle sheath using biplane roadmap technique and constant fluoroscopic guidance. Good aspiration obtained from the hub of the [facility] shuttle sheath. A gentle contrast injection demonstrated no evidence of spasms, dissections or of intraluminal filling defects. Over a 0.035 inch Roadrunner guidewire, a 115 cm 5 [place] Navien guide catheter was advanced to the origin of the left vertebral artery. The guidewire was then advanced distally into the left vertebral artery to the cranial skull base followed by the Navien guide catheter. At this time the [facility] shuttle sheath was also advanced into the proximal 1/3 of the left vertebral artery. The guidewire was removed from the 5 [place] Navien guide catheter. Good aspiration obtained from the hub of the Navien guide catheter. Gentle contrast injection demonstrated no evidence of spasms, dissections or of intraluminal filling defects. A control arteriogram was then performed from the 5 [place] Navien guide catheter in the left vertebrobasilar junction proximal to the origin of the left posterior-inferior cerebellar artery. This confirmed  the areas of severe stenosis as described earlier. Measurements were then performed of the basilar artery distally and also in the mid segment proximal to the anterior-inferior cerebellar artery, and also proximal to the proximal area of severe stenosis. It was decided to primarily revascularize both these areas with a single stent. To this effect, over a 0.014 inch Softip Synchro micro  guidewire, an 021 Trevo ProVue microcatheter was advanced to the distal tip of the Navien guide catheter in the left vertebrobasilar junction. With the micro guidewire leading with a J-tip configuration to avoid dissections or inducing spasm, manipulating the guidewire was performed with a torque device across the two areas of severe stenosis and into the right posterior cerebral artery P1 P2 segment. This was then followed by the microcatheter. The guidewire was removed. Good aspiration obtained from the hub of the 021 microcatheter which was now in the right posterior cerebral artery P1 segment. A gentle control arteriogram demonstrated good antegrade flow distally. This was then connected to continuous heparinized saline infusion. It was decided to use a [employer] vascular reconstruction device measuring 4 mm x 39 mm. This was then prepped and purged with heparinized saline infusion. In a coaxial manner and with constant heparinized saline infusion this was advanced to the distal end of the microcatheter. The O ring on the delivery microcatheter was then loosened. With slight forward gentle traction with the right hand on the delivery micro guidewire, with left hand the delivery microcatheter was unsheathing the distal portion of the device in the distal basilar artery followed by the proximal portion which was delivered proximal to the proximal severe focal area of stenosis. Under constant fluoroscopic guidance, the delivery microcatheter and the guidewire were retrieved and removed. Control arteriograms were then  performed at 15, 30 and 60 minutes post deployment of the endovascular stent. These continued to demonstrate excellent flow through the stented segments of the distal basilar artery and the proximal basilar artery/left vertebrobasilar junction. The distal portion was noted to be patent by 90%. The proximal stented segment appeared to be patent by 30-40% with good antegrade flow. During this time, multiple attempts were made to advance a Gateway balloon microcatheter over a 0.014 inch micro guidewire in order to angioplasty more proximal side. However, each time there was movement of the stent noted. Because of this, it was decided to leave the proximal lesion without angioplasty at 30-40% patency. Excellent perfusion was maintained in the left vertebrobasilar junction. No other areas of occlusions were seen. The superior cerebellar arteries and the anterior-inferior cerebellar arteries appeared widely patent. The 5 [place] Navien guide catheter and the [employer] sheath were then retrieved and removed. The 55 cm 8 French neurovascular sheath were left in situ and connected to continuous heparinized saline infusion. Because the patient had received IV tPA it was decided to leave the neurovascular sheath. The right groin was soft without evidence of a hemorrhage. Throughout the procedure, the patient's blood pressure and neurological status remained stable. No angiographic evidence of extravasation or of intraluminal filling defects or mass-effect was noted. A Dyna CT of the head performed on the table revealed no evidence of hemorrhage, mass effect or of intraventricular blood or subarachnoid hemorrhage. The patient was then transported to the neuro ICU intubated for further medical and stroke management. Prior to the placement of the stent to treat the two areas of severe stenosis, the patient was loaded with 180 mg of Brilinta, and 85 mg of aspirin via an orogastric tube. Also during procedure, the patient  received a total of 4.5 mg of super selective intracranial intra-arterial Integrilin in order to overcome the possibly of intra stent thrombus formation. 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 PLAN: Patient transferred to the ICU for further medical management. Electronically Signed   By: Luanne Bras M.D.   On: 11/19/2017 15:04   Ir Angio Vertebral Sel Subclavian Innominate Uni R Mod Sed  Result Date: 11/22/2017 INDICATION: Acute deterioration and level of consciousness. 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 EXAM: 1. EMERGENT LARGE VESSEL OCCLUSION THROMBOLYSIS (POSTERIOR CIRCULATION) COMPARISON:  CT angiogram of the head and neck of 11/19/2017. MEDICATIONS: 3 g Ancef IV antibiotic was administered within 1 hour of the procedure. ANESTHESIA/SEDATION: General anesthesia. CONTRAST:  Isovue 300 approximately 130 mL. FLUOROSCOPY TIME:  Fluoroscopy Time: 96 minutes 42 seconds (7834 mGy). COMPLICATIONS: None immediate. TECHNIQUE: Following a full explanation of the procedure along with the potential associated complications, an informed witnessed consent was obtained from the patient's spouse and children. The risks of intracranial hemorrhage of 10%, worsening neurological deficit, ventilator dependency, death and inability to revascularize were all reviewed in detail with the patient's family. The patient was then put under general anesthesia by the Department of Anesthesiology at [facility]. The right groin was prepped and draped in the usual sterile  fashion. Thereafter using modified Seldinger technique, transfemoral access into the right common femoral artery was obtained without difficulty. Over a 0.035 inch Amplatz stiff guidewire an 8 French 55 cm neurovascular sheath was inserted. Through this, and also over a 0.035 inch guidewire a 5 [place] JB 1 catheter was advanced to the aortic arch region and selectively positioned in the right subclavian artery, the right common carotid artery, the left common carotid artery and the left vertebral artery. FINDINGS: The right common carotid arteriogram demonstrates approximately 70% stenosis of the right external carotid artery at its origin. Its branches are, otherwise, normally opacified. The right internal carotid artery just distal to the bulb has a severe 90% plus stenosis. Distal to this there is a double U- shaped tortuosity of the proximal right internal carotid artery. Distal to this the right internal carotid artery assumes a normal course to the cranial skull base. The petrous, the cavernous and the supraclinoid segments are widely patent. The right middle cerebral artery and the right anterior cerebral artery opacify into the capillary and venous phases. Cross-filling via the anterior communicating artery of the left anterior cerebral artery A2 segment and distally is noted. Also seen is a small infundibulum at the origin of the right posterior communicating artery. The right subclavian arteriogram demonstrates the right vertebral artery origin to be widely patent. The vessel is seen to ascend unimpeded to the cranial skull base where it supplies the ipsilateral right posterior-inferior cerebellar artery. There is no opacification distal to this. The left common carotid arteriogram demonstrates the left external carotid artery origin to be narrowed by 50%. Its branches are normally opacified otherwise. The left internal carotid artery at the bulb demonstrates wide patency. There is a U-shaped tortuosity of  the proximal 1/3 of the left internal carotid artery. More distally, the vessel is seen to opacify normally to the cranial skull base. The petrous segment is unremarkable. There is mild fusiform dilatation of the cavernous segment. The distal cavernous segment and the supraclinoid segments are widely patent. A left posterior communicating artery is seen opacifying the left posterior cerebral artery distribution with retrograde partial opacification of the distal basilar artery and the superior  cerebellar arteries. The left middle cerebral artery and the left anterior cerebral artery opacify into the capillary and venous phases. Cross-filling via the anterior communicating artery of the right anterior cerebral artery A2 segment is noted. The left vertebral artery origin demonstrates wide patency. The vessel is seen to opacify normally to the cranial skull base. Wide patency is seen of the left posterior-inferior cerebellar artery. Distal to this there is severe pre occlusive stenosis of the vertebrobasilar junction just proximal to the basilar artery. Distal to this there is another area of severe pre occlusive stenosis of the mid basilar artery just distal to the origin of the anterior-inferior cerebellar arteries. Distal to this the distal basilar artery, the right posterior cerebral artery opacify into the capillary and venous phases. 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 The diagnostic JB 1 catheter in the distal left subclavian artery was exchanged over a 0.035 inch 300 cm Rosen exchange guidewire for a 6 French 80 cm Cook shuttle sheath using biplane roadmap technique and constant fluoroscopic guidance. Good aspiration obtained from the hub of the [facility] shuttle sheath. A gentle contrast injection demonstrated no evidence of spasms, dissections or of intraluminal filling defects. Over a 0.035 inch  Roadrunner guidewire, a 115 cm 5 [place] Navien guide catheter was advanced to the origin of the left vertebral artery. The guidewire was then advanced distally into the left vertebral artery to the cranial skull base followed by the Navien guide catheter. At this time the [facility] shuttle sheath was also advanced into the proximal 1/3 of the left vertebral artery. The guidewire was removed from the 5 [place] Navien guide catheter. Good aspiration obtained from the hub of the Navien guide catheter. Gentle contrast injection demonstrated no evidence of spasms, dissections or of intraluminal filling defects. A control arteriogram was then performed from the 5 [place] Navien guide catheter in the left vertebrobasilar junction proximal to the origin of the left posterior-inferior cerebellar artery. This confirmed the areas of severe stenosis as described earlier. Measurements were then performed of the basilar artery distally and also in the mid segment proximal to the anterior-inferior cerebellar artery, and also proximal to the proximal area of severe stenosis. It was decided to primarily revascularize both these areas with a single stent. To this effect, over a 0.014 inch Softip Synchro micro guidewire, an 021 Trevo ProVue microcatheter was advanced to the distal tip of the Navien guide catheter in the left vertebrobasilar junction. With the micro guidewire leading with a J-tip configuration to avoid dissections or inducing spasm, manipulating the guidewire was performed with a torque device across the two areas of severe stenosis and into the right posterior cerebral artery P1 P2 segment. This was then followed by the microcatheter. The guidewire was removed. Good aspiration obtained from the hub of the 021 microcatheter which was now in the right posterior cerebral artery P1 segment. A gentle control arteriogram demonstrated good antegrade flow distally. This was then connected to continuous heparinized saline infusion.  It was decided to use a [employer] vascular reconstruction device measuring 4 mm x 39 mm. This was then prepped and purged with heparinized saline infusion. In a coaxial manner and with constant heparinized saline infusion this was advanced to the distal end of the microcatheter. The O ring on the delivery microcatheter was then loosened. With slight forward gentle traction with the right hand on the delivery micro guidewire, with left hand the delivery microcatheter was  unsheathing the distal portion of the device in the distal basilar artery followed by the proximal portion which was delivered proximal to the proximal severe focal area of stenosis. Under constant fluoroscopic guidance, the delivery microcatheter and the guidewire were retrieved and removed. Control arteriograms were then performed at 15, 30 and 60 minutes post deployment of the endovascular stent. These continued to demonstrate excellent flow through the stented segments of the distal basilar artery and the proximal basilar artery/left vertebrobasilar junction. The distal portion was noted to be patent by 90%. The proximal stented segment appeared to be patent by 30-40% with good antegrade flow. During this time, multiple attempts were made to advance a Gateway balloon microcatheter over a 0.014 inch micro guidewire in order to angioplasty more proximal side. However, each time there was movement of the stent noted. Because of this, it was decided to leave the proximal lesion without angioplasty at 30-40% patency. Excellent perfusion was maintained in the left vertebrobasilar junction. No other areas of occlusions were seen. The superior cerebellar arteries and the anterior-inferior cerebellar arteries appeared widely patent. The 5 [place] Navien guide catheter and the [employer] sheath were then retrieved and removed. The 55 cm 8 French neurovascular sheath were left in situ and connected to continuous heparinized saline infusion. Because  the patient had received IV tPA it was decided to leave the neurovascular sheath. The right groin was soft without evidence of a hemorrhage. Throughout the procedure, the patient's blood pressure and neurological status remained stable. No angiographic evidence of extravasation or of intraluminal filling defects or mass-effect was noted. A Dyna CT of the head performed on the table revealed no evidence of hemorrhage, mass effect or of intraventricular blood or subarachnoid hemorrhage. The patient was then transported to the neuro ICU intubated for further medical and stroke management. Prior to the placement of the stent to treat the two areas of severe stenosis, the patient was loaded with 180 mg of Brilinta, and 85 mg of aspirin via an orogastric tube. Also during procedure, the patient received a total of 4.5 mg of super selective intracranial intra-arterial Integrilin in order to overcome the possibly of intra stent thrombus formation. 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 PLAN: Patient transferred to the ICU for further medical management. Electronically Signed   By: Luanne Bras M.D.   On: 11/19/2017 15:04    Assessment and Plan:   1.  Papillary thyroid cancer  CT neck 11/26/2017- left thyroid mass with partial encasement of the trachea, small calcified lower cervical nodes, supraclavicular nodes, and superior mediastinal nodes concerning for metastatic disease  CT chest 11/18/2017- small mediastinal lymph nodes, stable compared to 08/23/2016, left thyroid  nodule  Ultrasound-guided biopsy of a right cervical lymph node 11/29/2017-metastatic papillary carcinoma  2.  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   Diabetes  4.  Paroxysmal atrial fibrillation  5.  History of hemoptysis- potentially related to tracheal invasion from the thyroid mass, admission April 2018 with hemoptysis with bronchoscopy confirming bloody secretions from the left lower lung, no ulceration or mass was seen  6.  Ventilator dependent respiratory failure  7.  Peripheral vascular disease  8.  History of coronary artery disease   Mr. Harnois is admitted with a severe stroke.  He has multiple comorbid conditions.  He has been diagnosed with papillary thyroid cancer.  He has a left thyroid mass and metastatic chest/neck adenopathy.  In the usual circumstance the papillary thyroid cancer would be a treatable malignancy with an expected long-term survival.  We would recommend consideration of a thyroidectomy to be followed by radioactive iodine ablation of remnant thyroid tissue and metastatic disease.  It is unclear whether the thyroid mass is invading the airway accounting for the history of hemoptysis.  If the tumor is invading the airway primary resection may not be possible.  His acute prognosis is more related to the CVA and respiratory failure as opposed to the thyroid malignancy.  I see no role for radiation or systemic therapy for treating the thyroid cancer at present.  Recommendations: 1.  Management of respiratory failure per critical care medicine, determination of airway involvement by tumor as possible 2.  Consider ENT or surgical consult to  determine possibility of tracheostomy and future resection of the thyroid mass if he recovers from the CVA, may require transfer to a tertiary care center 3.  Please call Oncology as needed.  I will discuss the case with pulmonary medicine.  Betsy Coder, MD 12/02/2017, 2:18 PM

## 2017-12-02 NOTE — Progress Notes (Signed)
ANTICOAGULATION CONSULT NOTE   Pharmacy Consult for Heparin Indication: atrial fibrillation  No Known Allergies  Patient Measurements: Height: _0  (177.8 cm) Weight: (Abnormal) 316 lb 12.8 oz (143.7 kg) IBW/kg (Calculated) : 73 Heparin Dosing Weight: 109.5  Vital Signs: Temp: 99.4 F (37.4 C) (07/11 1200) Temp Source: Axillary (07/11 1200) BP: 109/70 (07/11 1156) Pulse Rate: 116 (07/11 1156)  Labs: Recent Labs    11/30/17 0510 11/30/17 0628  12/01/17 0341  12/01/17 1758 12/02/17 0200 12/02/17 0219 12/02/17 1103  HGB 9.4*  --   --  9.5*  --   --  10.3*  --   --   HCT 30.9*  --   --  31.0*  --   --  32.8*  --   --   PLT 256  --   --  281  --   --  319  --   --   HEPARINUNFRC  --   --    < >  --    < > 0.16* <0.10*  --  <0.10*  CREATININE  --  0.55*  --  0.51*  --   --   --  0.58*  --    < > = values in this interval not displayed.   Estimated Creatinine Clearance: 138.9 mL/min (A) (by C-G formula based on SCr of 0.58 mg/dL (L)).  Medical History: Past Medical History:  Diagnosis Date  . Collagen vascular disease ([place])   . Coronary artery disease   . GERD (gastroesophageal reflux disease)   . High cholesterol   . Hypertension   . OSA (obstructive sleep apnea)    "dx'd years ago; never have worn mask; mask never ordered" (04/01/2017)  . Pneumonia 2011  . Type II diabetes mellitus ([place]) 07/2016   Medications:  Scheduled:  . aspirin  81 mg Oral Daily   Or  . aspirin  81 mg Per Tube Daily  . carvedilol  6.25 mg Oral BID WC  . chlorhexidine gluconate (MEDLINE KIT)  15 mL Mouth Rinse BID  . Chlorhexidine Gluconate Cloth  6 each Topical Q0600  . clonazepam  1 mg Oral TID  . docusate  100 mg Oral BID  . feeding supplement (PRO-STAT SUGAR FREE 64)  30 mL Per Tube TID  . furosemide  40 mg Intravenous Q8H  . insulin aspart  0-20 Units Subcutaneous Q4H  . insulin aspart  8 Units Subcutaneous Q4H  . insulin glargine  45 Units Subcutaneous QHS  .  ipratropium-albuterol  3 mL Nebulization Q4H  . mouth rinse  15 mL Mouth Rinse 10 times per day  . pantoprazole sodium  40 mg Per Tube Daily  . rosuvastatin  20 mg Per Tube q1800  . sodium chloride flush  10-40 mL Intracatheter Q12H  . ticagrelor  90 mg Oral BID   Or  . ticagrelor  90 mg Per Tube BID   Assessment: 62 YO male admitted on 6/27 for stroke and was found to have a mass on his thyroid causing hemoptysis. Biopsy results have identified metastatic papillary thyroid carcinoma. Patient was on Eliquis PTA for atrial fibrillation but last dose was 3 weeks PTA. Patient is currently on Aspirin and Brilinta. Pharmacy has been consulted to dose heparin for further anticoagulation. CBC stable. No s/sx of bleeding documented.  Heparin level this morning remains SUBtherapeutic (HL<0.1). CBC stable. No interruptions and infusing appropriately per discussion with RN.   Goal of Therapy: Heparin level 0.3-0.5 units/ml Monitor platelets by anticoagulation protocol: Yes  Plan:  - Increase heparin to 3100 units/hr (31 ml/hr) - Will continue to monitor for any signs/symptoms of bleeding and will follow up with heparin level in 6 hours  -F/u with goals of care and anticoagulation plan  Thank you for allowing pharmacy to be a part of this patient's care.  Georga Bora, PharmD Clinical Pharmacist 12/02/2017 1:21 PM Please check AMION for all [place] numbers

## 2017-12-02 NOTE — Progress Notes (Signed)
ANTICOAGULATION CONSULT NOTE   Pharmacy Consult for Heparin Indication: atrial fibrillation  No Known Allergies  Patient Measurements: Height: 5' 10"  (177.8 cm) Weight: (!) 325 lb 6.4 oz (147.6 kg) IBW/kg (Calculated) : 73 Heparin Dosing Weight: 109.5  Vital Signs: Temp: 98.6 F (37 C) (07/11 0000) Temp Source: Axillary (07/11 0000) BP: 107/60 (07/11 0300) Pulse Rate: 99 (07/11 0300)  Labs: Recent Labs    11/30/17 0510 11/30/17 0628  12/01/17 0341 12/01/17 1100 12/01/17 1758 12/02/17 0200 12/02/17 0219  HGB 9.4*  --   --  9.5*  --   --  10.3*  --   HCT 30.9*  --   --  31.0*  --   --  32.8*  --   PLT 256  --   --  281  --   --  319  --   HEPARINUNFRC  --   --    < >  --  <0.10* 0.16* <0.10*  --   CREATININE  --  0.55*  --  0.51*  --   --   --  0.58*   < > = values in this interval not displayed.    Estimated Creatinine Clearance: 141 mL/min (A) (by C-G formula based on SCr of 0.58 mg/dL (L)).   Medical History: Past Medical History:  Diagnosis Date  . Collagen vascular disease ([place])   . Coronary artery disease   . GERD (gastroesophageal reflux disease)   . High cholesterol   . Hypertension   . OSA (obstructive sleep apnea)    "dx'd years ago; never have worn mask; mask never ordered" (04/01/2017)  . Pneumonia 2011  . Type II diabetes mellitus ([place]) 07/2016    Medications:  Scheduled:  . aspirin  81 mg Oral Daily   Or  . aspirin  81 mg Per Tube Daily  . carvedilol  6.25 mg Oral BID WC  . chlorhexidine gluconate (MEDLINE KIT)  15 mL Mouth Rinse BID  . Chlorhexidine Gluconate Cloth  6 each Topical Q0600  . clonazepam  1 mg Oral TID  . docusate  100 mg Oral BID  . feeding supplement (PRO-STAT SUGAR FREE 64)  30 mL Per Tube TID  . furosemide  40 mg Intravenous Q8H  . insulin aspart  0-20 Units Subcutaneous Q4H  . insulin aspart  8 Units Subcutaneous Q4H  . insulin glargine  45 Units Subcutaneous QHS  . ipratropium-albuterol  3 mL Nebulization Q4H  .  mouth rinse  15 mL Mouth Rinse 10 times per day  . pantoprazole sodium  40 mg Per Tube Daily  . rosuvastatin  20 mg Per Tube q1800  . sodium chloride flush  10-40 mL Intracatheter Q12H  . ticagrelor  90 mg Oral BID   Or  . ticagrelor  90 mg Per Tube BID    Assessment: 62 YO male admitted on 6/27 for stroke and was found to have a mass on his thyroid causing hemoptysis. Patient was on Eliquis PTA for atrial fibrillation but last dose was 3 weeks prior to admission. Patient is currently on Aspirin and Brilinta. Pharmacy has been consulted to dose heparin for further anticoagulation. CBC stable. No s/sx of bleeding documented.  Heparin level this morning is SUBtherapeutic (HL<0.1, goal of 0.3-0.7). Hgb/Hct trending up. No interruptions and infusing appropriately per discussion with RN.   Goal of Therapy: Heparin level 0.3-0.5 units/ml Monitor platelets by anticoagulation protocol: Yes   Plan:  - Increase heparin to 2800 units/hr (28 ml/hr) - Will continue to  monitor for any signs/symptoms of bleeding and will follow up with heparin level in 6 hours   Thank you for allowing pharmacy to be a part of this patient's care.  Alycia Rossetti, PharmD, BCPS Clinical Pharmacist Pager: [phone] If after 3:30p, please call main pharmacy at: [phone] 12/02/2017 3:49 AM

## 2017-12-02 NOTE — Progress Notes (Signed)
PULMONARY / CRITICAL CARE MEDICINE   Name: [name] [name] MRN: [id number] DOB: [birth year]    ADMISSION DATE:  11/18/2017 CONSULTATION DATE: 11/18/2017  REFERRING MD: Emergency department  CHIEF COMPLAINT: Stroke  HISTORY OF PRESENT ILLNESS:    [name] [name]  is a 62 year old man with atrial fibrillation, formally on anticoagulation was stopped due to recent hemoptysis.  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  He presented with an acute stroke 6/27, was intubated and potential tracheal erosion site covered with cuff balloon to facilitate TPA.  Unfortunately during the hospitalization is also experienced a second stroke and underwent mechanical thrombectomy, stent placement 7/1.  SUBJECTIVE:  No significant clinical change last 24 hours Remains on Precedex, fentanyl Desaturation when changed to pressure support ventilation this morning Cervical node biopsy results as below  VITAL SIGNS: BP 129/75   Pulse 99   Temp 98.9 F (37.2 C) (Axillary)   Resp (!) 23   Ht 5\' 10"  (1.778 m)   Wt (!) 143.7 kg (316 lb 12.8 oz)   SpO2 98%   BMI 45.46 kg/m   HEMODYNAMICS:    VENTILATOR SETTINGS: Vent Mode: CPAP;PSV FiO2 (%):  [40 %] 40 % Set Rate:  [20 bmp] 20 bmp Vt Set:  [580 mL] 580 mL PEEP:  [8 cmH20-10 cmH20] 8 cmH20 Pressure Support:  [18 cmH20] 18 cmH20 Plateau Pressure:  [21 cmH20-27 cmH20] 23 cmH20  INTAKE / OUTPUT: I/O last 3 completed shifts: In: 4404.8 [I.V.:2064.8; NG/GT:2340] Out: 7075 [Urine:7075]  PHYSICAL EXAMINATION: General: Critically ill obese man, ventilated, no distress, currently sleeping HEENT: ET tube in place, OG tube in place. Neuro: Able to open eyes with stimulation, unable to move his extremities.  He can communicate and answer questions by blinking CV: Irregularly irregular PULM: Coarse bilaterally, no crackles, no wheezes, decreased both bases GI: Obese, somewhat protuberant, nontender, hypoactive  bowel sounds Extremities: 2+ lower extremity edema Skin: No rash   Recent Labs  Lab 11/30/17 0628 12/01/17 0341 12/02/17 0219  NA 135 138 136  K 3.5 3.7 3.4*  CL 100 101 94*  CO2 29 30 33*  BUN 19 19 22   CREATININE 0.55* 0.51* 0.58*  GLUCOSE 233* 205* 147*    Electrolytes Recent Labs  Lab 11/30/17 0628 12/01/17 0341 12/02/17 0200 12/02/17 0219  CALCIUM 9.1 9.1  --  9.7  MG 2.1 2.0 2.2  --   PHOS  --   --   --  4.2    CBC Recent Labs  Lab 11/30/17 0510 12/01/17 0341 12/02/17 0200  WBC 9.3 9.9 11.1*  HGB 9.4* 9.5* 10.3*  HCT 30.9* 31.0* 32.8*  PLT 256 281 319    Coag's No results for input(s): APTT, INR in the last 168 hours.  Sepsis Markers No results for input(s): LATICACIDVEN, PROCALCITON, O2SATVEN in the last 168 hours.  ABG Recent Labs  Lab 11/27/17 0400 11/28/17 0425 11/29/17 0438  PHART 7.458* 7.460* 7.445  PCO2ART 37.0 40.0 39.1  PO2ART 78.0* 67.1* 71.0*    Liver Enzymes Recent Labs  Lab 12/02/17 0219  ALBUMIN 2.3*    Cardiac Enzymes No results for input(s): TROPONINI, PROBNP in the last 168 hours.  Glucose Recent Labs  Lab 12/01/17 1139 12/01/17 1650 12/01/17 1925 12/01/17 2310 12/02/17 0309 12/02/17 0750  GLUCAP 226* 227* 230* 161* 158* 220*    Imaging No results found. CULTURES: 6/27 MRSA PCR >> neg 11/21/2017 sputum>> normal flora  ANTIBIOTICS: 11/18/2017 Unasyn>> 7/2 7/2 Cefotan >> 7/6  SIGNIFICANT EVENTS: 11/18/2017  intubated Right cervical lymph node biopsy 7/8 >> metastatic papillary thyroid carcinoma  LINES/TUBES: 11/18/2017 endotracheal tube>> 6/27 OGT >> 6/27 Foley >>  DISCUSSION:  ASSESSMENT / PLAN:  PULMONARY A: Vent dependent respiratory failure Possible airway compromise due to paratracheal/thyroid mass Pulmonary nodules, question metastatic Right upper lobe infiltrate at presentation, presumed pneumonia, culture negative COPD Continue PCV.  He did not tolerate transition to pressure  support 7/11, desaturated VAP prevention order set Tolerating diuresis, continue Lasix 40 mg every 8 hours for now Tracheal deviation and possible erosion due to his paratracheal mass.  This is now been shown to be metastatic papillary thyroid carcinoma.  Even if we were to continue aggressive care, long-term ventilation, the mass will impact whether he can successfully undergo tracheostomy.  He does not sound like he could do this in [place], would need to be done at a tertiary center.  Suspect that there be no benefit conferred given his neurological prognosis, oncology prognosis.  CARDIOVASCULAR A:  History of  Paroxysmal atrial fibrillation, currently off anticoagulation for recent hemoptysis of 3 weeks duration     Coronary artery disease.   Peripheral vascular disease Metoprolol as needed Continue Crestor, carvedilol Continue heparin drip Lasix at same dose as outlined above Continue Brilinta, aspirin  RENAL  A:   No acute renal issues - remains positive 9.1L  P:   Continue Lasix as described above Improved diuresis with metolazone 7/10, likely repeat on 7/12 Replace electrolytes as indicated Follow BMP, urine output  Thyroid Mass with possible associated airway compromise Appreciate Dr. Pascal Lux assistance with interventional radiology.  Metastatic papillary thyroid carcinoma.  I do not believe there is significant benefit to be conferred with tracheostomy.  This will only allow continue mechanical ventilation in a very debilitated state due to his strokes until he succumbs to his metastatic cancer.   GASTROINTESTINAL A:   History of GERD Nutrition P:   PPI as ordered Tube feeding at goal dulcolax and colace   HEMATOLOGIC A:   Chronic anticoagulation for atrial fibrillation Off anticoagulation for 3 weeks due to hemoptysis Follow CBC on aspirin, Brilinta, heparin drip  INFECTIOUS A:   Observed aspiration, right upper lobe pneumonia P:   Following off  antibiotics  ENDOCRINE Diabetes mellitus - ongoing hyperglycemia  P:   CBG q 4 SSI - resistant Lantus 45 units HS Increased novolog scheduled q 4 from 6 to 8 units on 7/10  PAD Patient had a cool right foot which resolved with removing his right femoral sheath.    NEUROLOGIC A:   Multiple strokes, initially received TPA and then underwent basilar artery thrombectomy and stent placement Appreciate neurology management.  Poor prognosis for neurological recovery Brilinta, aspirin    FAMILY: I updated the patient's son, daughter on 7/10.  Have updated daughter and girlfriend on 7/11.  Described his tissue diagnosis, poor prognosis to them.  There is an overall family meeting planned for 7/12 with neurology.  I will try to participate in this if available.  Baltazar Apo, MD, PhD 12/02/2017, 8:51 AM  Pulmonary and Critical Care [phone] or if no answer [phone]

## 2017-12-02 NOTE — Progress Notes (Deleted)
ANTICOAGULATION CONSULT NOTE   Pharmacy Consult for Heparin Indication: atrial fibrillation  No Known Allergies  Patient Measurements: Height: 5' 10"  (177.8 cm) Weight: (!) 316 lb 12.8 oz (143.7 kg) IBW/kg (Calculated) : 73 Heparin Dosing Weight: 109.5  Vital Signs: Temp: 99.6 F (37.6 C) (07/11 1600) Temp Source: Axillary (07/11 1600) BP: 123/65 (07/11 1800) Pulse Rate: 99 (07/11 1800)  Labs: Recent Labs    11/30/17 0510 11/30/17 0628  12/01/17 0341  12/02/17 0200 12/02/17 0219 12/02/17 1103 12/02/17 1800  HGB 9.4*  --   --  9.5*  --  10.3*  --   --   --   HCT 30.9*  --   --  31.0*  --  32.8*  --   --   --   PLT 256  --   --  281  --  319  --   --   --   HEPARINUNFRC  --   --    < >  --    < > <0.10*  --  <0.10* 0.38  CREATININE  --  0.55*  --  0.51*  --   --  0.58*  --   --    < > = values in this interval not displayed.   Estimated Creatinine Clearance: 138.9 mL/min (A) (by C-G formula based on SCr of 0.58 mg/dL (L)).  Medical History: Past Medical History:  Diagnosis Date  . Collagen vascular disease ([place])   . Coronary artery disease   . GERD (gastroesophageal reflux disease)   . High cholesterol   . Hypertension   . OSA (obstructive sleep apnea)    "dx'd years ago; never have worn mask; mask never ordered" (04/01/2017)  . Pneumonia 2011  . Type II diabetes mellitus ([place]) 07/2016   Medications:  Scheduled:  . aspirin  81 mg Oral Daily   Or  . aspirin  81 mg Per Tube Daily  . carvedilol  6.25 mg Oral BID WC  . chlorhexidine gluconate (MEDLINE KIT)  15 mL Mouth Rinse BID  . Chlorhexidine Gluconate Cloth  6 each Topical Q0600  . clonazepam  1 mg Oral TID  . docusate  100 mg Oral BID  . feeding supplement (PRO-STAT SUGAR FREE 64)  30 mL Per Tube TID  . furosemide  40 mg Intravenous Q8H  . insulin aspart  0-20 Units Subcutaneous Q4H  . insulin aspart  8 Units Subcutaneous Q4H  . insulin glargine  45 Units Subcutaneous QHS  . ipratropium-albuterol  3 mL  Nebulization Q4H  . mouth rinse  15 mL Mouth Rinse 10 times per day  . pantoprazole sodium  40 mg Per Tube Daily  . rosuvastatin  20 mg Per Tube q1800  . sodium chloride flush  10-40 mL Intracatheter Q12H  . ticagrelor  90 mg Oral BID   Or  . ticagrelor  90 mg Per Tube BID   Assessment: 62 YO male admitted on 6/27 for stroke and was found to have a mass on his thyroid causing hemoptysis. Biopsy results have identified metastatic papillary thyroid carcinoma. Patient was on Eliquis PTA for atrial fibrillation but last dose was 3 weeks PTA. Patient is currently on Aspirin and Brilinta. Pharmacy has been consulted to dose heparin for further anticoagulation. CBC stable. No s/sx of bleeding documented.  Heparin level this evening is therapeutic at 0.38. CBC stable.   Goal of Therapy: Heparin level 0.3-0.5 units/ml Monitor platelets by anticoagulation protocol: Yes   Plan:  - Continue heparin at 3100  units/hr (31 ml/hr) - Will continue to monitor for any signs/symptoms of bleeding and will follow up with a confirmatory heparin level in 6 hours  -F/u with goals of care and anticoagulation plan  Quentavious Rittenhouse A. Levada Dy, PharmD, [place] Pager: [phone] Please utilize Amion for appropriate phone number to reach the unit pharmacist ([place])   12/02/2017 7:36 PM

## 2017-12-02 NOTE — Progress Notes (Signed)
ANTICOAGULATION CONSULT NOTE   Pharmacy Consult for Heparin Indication: atrial fibrillation  No Known Allergies  Patient Measurements: Height: 5' 10"  (177.8 cm) Weight: (!) 316 lb 12.8 oz (143.7 kg) IBW/kg (Calculated) : 73 Heparin Dosing Weight: 109.5  Vital Signs: Temp: 99.6 F (37.6 C) (07/11 1600) Temp Source: Axillary (07/11 1600) BP: 123/65 (07/11 1800) Pulse Rate: 99 (07/11 1800)  Labs: Recent Labs    11/30/17 0510 11/30/17 0628  12/01/17 0341  12/02/17 0200 12/02/17 0219 12/02/17 1103 12/02/17 1800  HGB 9.4*  --   --  9.5*  --  10.3*  --   --   --   HCT 30.9*  --   --  31.0*  --  32.8*  --   --   --   PLT 256  --   --  281  --  319  --   --   --   HEPARINUNFRC  --   --    < >  --    < > <0.10*  --  <0.10* 0.38  CREATININE  --  0.55*  --  0.51*  --   --  0.58*  --   --    < > = values in this interval not displayed.   Estimated Creatinine Clearance: 138.9 mL/min (A) (by C-G formula based on SCr of 0.58 mg/dL (L)).  Medical History: Past Medical History:  Diagnosis Date  . Collagen vascular disease ([place])   . Coronary artery disease   . GERD (gastroesophageal reflux disease)   . High cholesterol   . Hypertension   . OSA (obstructive sleep apnea)    "dx'd years ago; never have worn mask; mask never ordered" (04/01/2017)  . Pneumonia 2011  . Type II diabetes mellitus ([place]) 07/2016   Medications:  Scheduled:  . aspirin  81 mg Oral Daily   Or  . aspirin  81 mg Per Tube Daily  . carvedilol  6.25 mg Oral BID WC  . chlorhexidine gluconate (MEDLINE KIT)  15 mL Mouth Rinse BID  . Chlorhexidine Gluconate Cloth  6 each Topical Q0600  . clonazepam  1 mg Oral TID  . docusate  100 mg Oral BID  . feeding supplement (PRO-STAT SUGAR FREE 64)  30 mL Per Tube TID  . furosemide  40 mg Intravenous Q8H  . insulin aspart  0-20 Units Subcutaneous Q4H  . insulin aspart  8 Units Subcutaneous Q4H  . insulin glargine  45 Units Subcutaneous QHS  . ipratropium-albuterol  3 mL  Nebulization Q4H  . mouth rinse  15 mL Mouth Rinse 10 times per day  . pantoprazole sodium  40 mg Per Tube Daily  . rosuvastatin  20 mg Per Tube q1800  . sodium chloride flush  10-40 mL Intracatheter Q12H  . ticagrelor  90 mg Oral BID   Or  . ticagrelor  90 mg Per Tube BID   Assessment: 62 YO male admitted on 6/27 for stroke and was found to have a mass on his thyroid causing hemoptysis. Biopsy results have identified metastatic papillary thyroid carcinoma. Patient was on Eliquis PTA for atrial fibrillation but last dose was 3 weeks PTA. Patient is currently on Aspirin and Brilinta. Pharmacy has been consulted to dose heparin for further anticoagulation. CBC stable. No s/sx of bleeding documented.  Heparin level within goal range (0.38). CBC stable.   Goal of Therapy: Heparin level 0.3-0.5 units/ml Monitor platelets by anticoagulation protocol: Yes   Plan:  - Continue heparin to 3100 units/hr (31  ml/hr) - Will continue to monitor for any signs/symptoms of bleeding and will follow up with heparin level in 6 hours  -F/u with goals of care and anticoagulation plan  Thank you for allowing pharmacy to be a part of this patient's care.  Alanda Slim, PharmD, [facility] Clinical Pharmacist 12/02/2017 7:36 PM Please check AMION for all [place] numbers

## 2017-12-02 NOTE — Progress Notes (Signed)
STROKE TEAM PROGRESS NOTE    SUBJECTIVE (INTERVAL HISTORY) [name] [name] and [name] [name] and [name] from [place] are at the bedside. He had  LN bx 11/30/17 and results metastatic papillary thyroid carcinoma. Pt remains intubated without movement.   he remains on IV heparin drip and has not had any significant bleeding.hematocrit is stable at 30.9 OBJECTIVE Temp:  [98.5 F (36.9 C)-99.4 F (37.4 C)] 99.4 F (37.4 C) (07/11 1200) Pulse Rate:  [79-116] 116 (07/11 1156) Cardiac Rhythm: Atrial fibrillation (07/11 0400) Resp:  [22-30] 24 (07/11 1156) BP: (98-130)/(60-86) 109/70 (07/11 1156) SpO2:  [92 %-98 %] 95 % (07/11 1156) FiO2 (%):  [40 %] 40 % (07/11 1156) Weight:  [316 lb 12.8 oz (143.7 kg)] 316 lb 12.8 oz (143.7 kg) (07/11 0500)  CBC:  Recent Labs  Lab 11/28/17 0611 11/29/17 0500  12/01/17 0341 12/02/17 0200  WBC 8.8 9.3   < > 9.9 11.1*  NEUTROABS 6.6 6.8  --   --   --   HGB 10.1* 9.4*   < > 9.5* 10.3*  HCT 31.7* 30.6*   < > 31.0* 32.8*  MCV 88.3 90.3   < > 89.6 88.2  PLT 252 229   < > 281 319   < > = values in this interval not displayed.    Basic Metabolic Panel:  Recent Labs  Lab 12/01/17 0341 12/02/17 0200 12/02/17 0219  NA 138  --  136  K 3.7  --  3.4*  CL 101  --  94*  CO2 30  --  33*  GLUCOSE 205*  --  147*  BUN 19  --  22  CREATININE 0.51*  --  0.58*  CALCIUM 9.1  --  9.7  MG 2.0 2.2  --   PHOS  --   --  4.2    Lipid Panel:     Component Value Date/Time   CHOL 110 11/19/2017 0437   CHOL 119 08/09/2017 1000   TRIG 340 (H) 11/19/2017 0437   HDL 19 (L) 11/19/2017 0437   HDL 29 (L) 08/09/2017 1000   CHOLHDL 5.8 11/19/2017 0437   VLDL 68 (H) 11/19/2017 0437   LDLCALC 23 11/19/2017 0437   LDLCALC 30 08/09/2017 1000   HgbA1c:  Lab Results  Component Value Date   HGBA1C 9.1 (H) 11/19/2017   Urine Drug Screen: No results found for: LABOPIA, COCAINSCRNUR, LABBENZ, AMPHETMU, THCU, LABBARB  Alcohol Level No results found for: ETH  IMAGING Ct Angio  Head W Or Wo Contrast  Ct Angio Neck W Or Wo Contrast 11/18/2017 Since the study of earlier today, there are only 2 changes. There is new consolidation and volume loss in the right upper lobe. 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 Flow is seen distal to that however, presumably indicating patent posterior communicating arteries.   Ct Angio Head W Or Wo Contrast Ct Angio Neck W Or Wo Contrast 11/18/2017 1. Severe intracranial and extracranial atherosclerotic disease. No emergent large vessel occlusion  2. Severe stenosis right carotid bifurcation due to calcified plaque, estimated 90% diameter stenosis. No significant left carotid stenosis. Severe stenosis cavernous carotid bilaterally.  3. Both vertebral arteries are patent in the neck with severe calcific stenosis V4 segment bilaterally.  4. 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 Small calcified lymph nodes left neck suspicious for metastatic disease  5. Small lung nodules on the right, possible metastatic disease. Recommend chest CT for further evaluation.   Ct Head Wo Contrast 11/19/2017  1. Expected evolution of brainstem infarct without hemorrhage.  2. No new infarcts.  3. Left vertebral artery and basilar artery stent.  4. Acute on chronic sinus disease may be related to intubation.   Ct Chest W Contrast 11/18/2017 Right upper lobe airspace opacity is noted with air bronchograms concerning for pneumonia or possibly atelectasis. Stable mediastinal adenopathy is noted compared to prior exam. Endotracheal and nasogastric tubes are in grossly good position. Coronary artery calcifications are noted suggesting coronary artery disease. Possible 2.8 cm left thyroid nodule. Thyroid ultrasound is recommended for further evaluation. Aortic Atherosclerosis (ICD10-I70.0).   Mr Brain Wo Contrast 11/19/2017 1. Acute/early subacute infarction within the central pons involving both sides, greater on the right, measuring up  to 2.6 cm, 3.6 cc. No associated hemorrhage or significant mass effect.  2. Additional punctate acute/early subacute infarctions within left lateral cerebellum and right parietal cortex.  3. Minimal chronic microvascular ischemic changes of the brain and mild parenchymal volume loss.   Ct Head Code Stroke Wo Contrast 11/18/2017 1. No acute intracranial abnormality.  Atherosclerotic calcification  2. ASPECTS is 10   Cerebral Angiogram / Stent  11/18/2017 1. 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 Occluded non dominant RT VBJ. 2. S/P endovascular revascularization of the distal basilar artery  To 90 % patency , and of 30 to 40 % of the LT VBJ with stent placement. Dominant  Lt Pcom.  Transthoracic Echocardiogram - Left ventricle: The cavity size was moderately dilated. Wall thickness was increased in a pattern of severe LVH. Systolic function was normal. The estimated ejection fraction was in the range of 55% to 60%. Wall motion was normal; there were no regional wall motion abnormalities. The study is not technically sufficient to allow evaluation of LV diastolic function. - Aortic valve: Trileaflet. Sclerosis without stenosis. There was no regurgitation. Valve area (Vmax): 2.88 cm^2. - Aorta: Aortic root ML diameter: 41.95 mm (ED). - Aortic root: The aortic root is mildly dilated. - Mitral valve: Calcified annulus. There was trivial regurgitation. - Left atrium: The atrium was mildly dilated. - Right ventricle: The cavity size was mildly dilated. Mildly reduced systolic function. - Inferior vena cava: The vessel was normal in size. The respirophasic diameter changes were in the normal range (>= 50%), consistent with normal central venous pressure. Impressions:  Compared to a prior study in 02/2017, there have been no significant changes.  Ct Head Wo Contrast 11/24/2017 1. Continued normal expected interval evolution of  brainstem infarct. No evidence for hemorrhagic transformation or other complication.  2. No other new intracranial abnormality.  3. Moderate paranasal sinus disease with bilateral mastoid effusions, likely related intubation.   Dg Chest Port 1 View 11/25/2017 Mild vascular congestion. Tubes and lines as described.   Ct Soft Tissue Neck W Contrast 11/26/2017 Aside from the fact that the patient shows endotracheal intubation and the presence of an orogastric tube, and aside from development of bilateral layering pleural effusions with dependent atelectasis, the findings are unchanged since the previous studies. Irregular mass arising from the inferior left thyroid lobe, measuring approximately 5 x 3.4 x 5.5 cm, with invasion of the surrounding tissues worrisome for thyroid carcinoma. Partial encasement of the trachea. Broad surface along the esophagus. Small but likely pathologic nodes in the lower neck and superior mediastinum, some with calcification, probably involved by metastatic disease.   Dg Chest Port 1 View 11/29/2017 Tube and catheter positions as described without  pneumothorax. There is pulmonary vascular congestion with interstitial edema and small pleural effusions. Suspect a degree of underlying congestive heart failure. No airspace consolidation.     PHYSICAL EXAM General - morbid obesity,middle aged Caucasian male, intubated on sedation.  Ophthalmologic - fundi not visualized due to noncooperation.  Cardiovascular - irregularly irregular heart rate and rhythm, afib RVR.  Neuro - intubated on sedation,    Eyes open but not blinking to visual threat, pinpoint pupils, not reactive to light, eye dysconjugate with right eye inward position, right eye mid position, able to voluntarily move the eyes in the vertical plane and partially to the left past midline but not able to look to the right. Has saccadic dysmetria. Weak gag.and has respiratory effort above ventilator setting.  . Facial  symmetry not able to test due to ET tube, not able to move tongue or mouth on command. No movement on all extremities, DTR diminished and no babinski. Sensation, coordination and gait not tested.   ASSESSMENT/PLAN Mr. Darick Fetters is a 62 y.o. male with PMH of DM, hemoptysis, OSA, HTN, HLD, CAD, afib off Eliquis due to hemoptysis presenting with slurred speech, right sided weakness. Was intubated for hemoptysis prevention while giving tPA. Symptoms worsened and imaging confirmed BA occlusion. S/p IR with revascularization of BA as well as left VA s/p stenting.  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   MRI head - large acute/early subacute infarction within the central pons involving both sides, greater on the right.  CTA H&N - severe vascular athero including BA stenosis, b/l VA stenosis, right ICA proximal 90% stenosis, b/l cavernous ICA stenosis R>L.  Repeat CTA H/N - new distal BA occlusion  DSA - revascularization of the distal basilar artery to 90 % patency , and of 30 to 40 % of the LT VBJ with stent placement.  CT head 11/24/17 showed normal evolution of brainstem infarct, no acute change  2D Echo - EF 55-60%  LDL - 23  HgbA1c - 9.1  VTE prophylaxis - heparin subq  Eliquis (recently stopped) and plavix prior to admission, now on aspirin 81 mg daily and Brilinta . Now on IV heparin drip.   Ongoing aggressive stroke risk factor management  Therapy recommendations:  pending  Disposition:  Pending  Thyroid mass  CTA neck - right large thyroid mass compressing on trachea.   Thyroid mass larger  than CT chest in 08/2016  Evidence of tracheal erosion on bronch at this admission  CT neck with contrast showed thyroid mass concerning for carcinoma, encasement of trachea.    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   oncology consult pending d/w Dr Learta Codding  Respiratory failure  CCM on board  Current intubated on sedation  no cuff leak  Fighting with vent when off sedation Likely need trach  As [name] airways likely to be compromised by [name] thyroid cancer PAF  Currently in afib RVR with respiratory distress  On eliquis in the past but off due to hemoptysis  Hold off at this time due to hemoptysis and upcoming biopsy  On ASA and brilinta  Coreg bid for rate control  Cerebral  vascular athero  CTA head and neck showed right ICA proximal 90% stenosis, left ICA proximal athero, b/l cavernous ICA severe athro R>L, BA severe stenosis, b/l V4 severe athero with right V4 occlusion  S/p left V4 stent and BA revascularization  On ASA and brilinta  Hemoptysis, improved  Since 08/2016 as per family  Not sure about etiology  TB ruled out in the past  Possible tracheal erosion from right thyroid mass   CT chest showed scattered long nodules, RUE suspected PNA  CT neck showed thyroid mass concerning for carcinoma, encasement of trachea.   CCM on board  Fever   Tmax 102.4->102.5->100.0->98.3->100.8  CXR - progression of bibasilar consolidation -> Mild vascular congestion is noted without focal confluent infiltrate  Off antibiotics  Hypertension  Stable on the low side  Now on coreg bid  Also on HCTZ for limb swelling . Long term BP goal normotensive.  Hyperlipidemia  Lipid lowering medication PTA: Crestor 10 mg daily  LDL 23, goal < 70  On Crestor  Continue statin at discharge  Diabetes  HgbA1c 9.1, goal < 7.0  Uncontrolled  Off insulin drip, CBG fluctuate  On lantus and SSI   CBG monitoring  Other Stroke Risk  Factors  Advanced age  Former cigarette smoker - quit, 40 pack year history  Morbid obesity, Body mass index is 45.46 kg/m.    Hx stroke/TIA  Coronary artery disease   PAD with claudication  OSA on CPAP  Other Active Problems  Leukocytosis, resolved WBC 9.3  Anemia 9.4   Hyponatremia 134  Plan  .lymph node biopsy results suggest metastatic papillary thyroid carcinoma ibuprofen spoke to Dr. Illene Regulus   the oncologist who will see the patient later today.We will need to discuss with patient about [name] wishes and goals of care as he may not be a good candidate for major surgery, chemotherapy or radiation given [name] quadriplegia and poor neurological status. Plan Continue IV heparin drip  He may also need tracheostomy but surgically this may be challenging given [name] thyroid mass surrounding [name] trachea. Long discussion att bedside with the patient,[name] [name] and [name] [name]. Anticipated arrival of patient's family in the next few days to have family meeting to decide goals of care.Plan to also consult palliative care team prior to making definitive decisions. Long discussion with patient, [name] [name] [name] as well as [name] [name] & and answered questions.family meeting scheduled for tomorrow morning to make definite plans. This patient is critically ill and at significant risk of neurological worsening, death and care requires constant monitoring of vital signs, hemodynamics,respiratory and cardiac monitoring, extensive review of multiple databases, frequent neurological assessment, discussion with family, other specialists and medical decision making of high complexity.I have made any additions or clarifications directly to the above note.This critical care time does not reflect procedure time, or teaching time or supervisory time of PA/NP/Med Resident etc but could involve care discussion time.  I spent 40 minutes of neurocritical care time  in the care of  this patient. Antony Contras,  MD Medical Director [facility] Pager: [phone] 12/02/2017 1:03 PM    To contact Stroke Continuity provider, please refer to [email or web address] After hours, contact General Neurology

## 2017-12-02 NOTE — Plan of Care (Signed)
Neuro: Pt remains sedated and unable to move any extremities. Pt able to blink on command. Cough and corneal reflexes intact.   Respiratory: Pt remains on ventilator at this time with no changes to vent setting throughout the shift. Pt on pressure control at this time. O2 sats WNL. Pt with bloody secretions from ETT.    Cardiovascular: Pt remains in Afib at times. BP continues to be WNL. Pt with generalized edema.    GI/GU: Pt with foley in place with adequate urine output. Pt with >3051ml of urine output throughout shift. Last BM today. Pt with OG tube and tube feeds going at this time. Pt tolerating feeds with minimal residuals.    Skin: Skin intact with no s/s of skin breakdown at this time. Pt continues to be a Copy turn.   Pain: Pt with no signs of pain, Fentanyl infusion remains at 63mcg/hr.    Events: NO acute events throughout shift. Pts plan of care to continue with current regimen. Plan for family meeting tomorrow at [place], palliative consulted.  Family updated and no further questions at this time.

## 2017-12-03 DIAGNOSIS — Z515 Encounter for palliative care: Secondary | ICD-10-CM

## 2017-12-03 DIAGNOSIS — Z7189 Other specified counseling: Secondary | ICD-10-CM

## 2017-12-03 LAB — BASIC METABOLIC PANEL
Anion gap: 12 (ref 5–15)
BUN: 26 mg/dL — AB (ref 8–23)
CALCIUM: 9.8 mg/dL (ref 8.9–10.3)
CO2: 33 mmol/L — AB (ref 22–32)
Chloride: 90 mmol/L — ABNORMAL LOW (ref 98–111)
Creatinine, Ser: 0.65 mg/dL (ref 0.61–1.24)
GFR calc Af Amer: >60 mL/min (ref >60)
GLUCOSE: 243 mg/dL — AB (ref 70–99)
Potassium: 3.2 mmol/L — ABNORMAL LOW (ref 3.5–5.1)
Sodium: 135 mmol/L (ref 135–145)

## 2017-12-03 LAB — GLUCOSE, CAPILLARY
GLUCOSE-CAPILLARY: 253 mg/dL — AB (ref 70–99)
GLUCOSE-CAPILLARY: 213 mg/dL — AB (ref 70–99)
Glucose-Capillary: 203 mg/dL — ABNORMAL HIGH (ref 70–99)
Glucose-Capillary: 235 mg/dL — ABNORMAL HIGH (ref 70–99)
Glucose-Capillary: 248 mg/dL — ABNORMAL HIGH (ref 70–99)
Glucose-Capillary: 261 mg/dL — ABNORMAL HIGH (ref 70–99)
Glucose-Capillary: 265 mg/dL — ABNORMAL HIGH (ref 70–99)

## 2017-12-03 LAB — HEPARIN LEVEL (UNFRACTIONATED): Heparin Unfractionated: 0.43 IU/mL (ref 0.30–0.70)

## 2017-12-03 LAB — CBC
HEMATOCRIT: 34.6 % — AB (ref 39.0–52.0)
Hemoglobin: 10.8 g/dL — ABNORMAL LOW (ref 13.0–17.0)
MCH: 27.3 pg (ref 26.0–34.0)
MCHC: 31.2 g/dL (ref 30.0–36.0)
MCV: 87.6 fL (ref 78.0–100.0)
PLATELETS: 388 10*3/uL (ref 150–400)
RBC: 3.95 MIL/uL — ABNORMAL LOW (ref 4.22–5.81)
RDW: 13.8 % (ref 11.5–15.5)
WBC: 11.4 10*3/uL — ABNORMAL HIGH (ref 4.0–10.5)

## 2017-12-03 LAB — MAGNESIUM: Magnesium: 1.9 mg/dL (ref 1.7–2.4)

## 2017-12-03 MED ORDER — METOLAZONE 2.5 MG PO TABS
2.5000 mg | ORAL_TABLET | Freq: Once | ORAL | Status: AC
  Administered 2017-12-03: 2.5 mg
  Filled 2017-12-03: qty 1

## 2017-12-03 MED ORDER — POTASSIUM CHLORIDE 20 MEQ PO PACK
40.0000 meq | PACK | ORAL | Status: AC
  Administered 2017-12-03 (×2): 40 meq
  Filled 2017-12-03 (×2): qty 2

## 2017-12-03 MED ORDER — IPRATROPIUM-ALBUTEROL 0.5-2.5 (3) MG/3ML IN SOLN
3.0000 mL | Freq: Three times a day (TID) | RESPIRATORY_TRACT | Status: DC
  Administered 2017-12-03 – 2017-12-11 (×26): 3 mL via RESPIRATORY_TRACT
  Filled 2017-12-03 (×24): qty 3

## 2017-12-03 MED ORDER — SODIUM CHLORIDE 0.9 % IV SOLN
INTRAVENOUS | Status: DC | PRN
  Administered 2017-12-03 – 2017-12-14 (×4): via INTRAVENOUS

## 2017-12-03 MED ORDER — POTASSIUM CHLORIDE 10 MEQ/100ML IV SOLN
10.0000 meq | INTRAVENOUS | Status: AC
  Administered 2017-12-03 (×3): 10 meq via INTRAVENOUS
  Filled 2017-12-03 (×3): qty 100

## 2017-12-03 NOTE — Progress Notes (Signed)
Patient's significant other Juliann Pulse) has questions about how to get formal paperwork for medical POA and how to access patient's finances in order to pay his bills. Social work called and left a message. Will follow-up. Yasamin Karel, Rande Brunt, RN

## 2017-12-03 NOTE — Progress Notes (Signed)
Inpatient Diabetes Program Recommendations  AACE/ADA: New Consensus Statement on Inpatient Glycemic Control (2015)  Target Ranges:  Prepandial:   less than 140 mg/dL      Peak postprandial:   less than 180 mg/dL (1-2 hours)      Critically ill patients:  140 - 180 mg/dL   Results for JOESIAH, LONON (MRN [id number]) as of 12/03/2017 09:45  Ref. Range 12/02/2017 07:50 12/02/2017 12:32 12/02/2017 16:39 12/02/2017 19:42 12/03/2017 00:04 12/03/2017 03:55 12/03/2017 08:40  Glucose-Capillary Latest Ref Range: 70 - 99 mg/dL 220 (H) 215 (H) 210 (H) 239 (H) 235 (H) 203 (H) 265 (H)    Review of Glycemic Control  Diabetes history: DM 2 Outpatient Diabetes medications: Victoza 1.2 mg Daily, Metformin 1000 mg BID Current orders for Inpatient glycemic control: Lantus 45 units, Novolog 0-20 units Q4 hours, Novolog 8 units Q4 hours Tube Feed Coverage  Inpatient Diabetes Program Recommendations:    Consider increasing Novolog Tube Feed Coverage to 10 units Q4 hours.  Thanks,  Tama Headings RN, MSN, BC-ADM, [facility] Inpatient Diabetes Coordinator Team Pager [phone] (8a-5p)

## 2017-12-03 NOTE — Progress Notes (Signed)
STROKE TEAM PROGRESS NOTE    SUBJECTIVE (INTERVAL HISTORY) Girlfriend and multiple family members are at the bedside.   Pt remains intubated without movement.   he remains on IV heparin drip and has not had any significant bleeding.hematocrit is stable at 34.6 OBJECTIVE Temp:  [98.5 F (36.9 C)-100.3 F (37.9 C)] 98.9 F (37.2 C) (07/12 0842) Pulse Rate:  [83-124] 124 (07/12 1500) Cardiac Rhythm: Atrial fibrillation (07/12 0800) Resp:  [15-31] 31 (07/12 1500) BP: (100-155)/(63-92) 133/92 (07/12 1500) SpO2:  [93 %-98 %] 96 % (07/12 1500) FiO2 (%):  [40 %] 40 % (07/12 1225) Weight:  [313 lb 0.9 oz (142 kg)] 313 lb 0.9 oz (142 kg) (07/12 0500)  CBC:  Recent Labs  Lab 11/28/17 0611 11/29/17 0500  12/02/17 0200 12/03/17 0558  WBC 8.8 9.3   < > 11.1* 11.4*  NEUTROABS 6.6 6.8  --   --   --   HGB 10.1* 9.4*   < > 10.3* 10.8*  HCT 31.7* 30.6*   < > 32.8* 34.6*  MCV 88.3 90.3   < > 88.2 87.6  PLT 252 229   < > 319 388   < > = values in this interval not displayed.    Basic Metabolic Panel:  Recent Labs  Lab 12/02/17 0200 12/02/17 0219 12/03/17 0558  NA  --  136 135  K  --  3.4* 3.2*  CL  --  94* 90*  CO2  --  33* 33*  GLUCOSE  --  147* 243*  BUN  --  22 26*  CREATININE  --  0.58* 0.65  CALCIUM  --  9.7 9.8  MG 2.2  --  1.9  PHOS  --  4.2  --     Lipid Panel:     Component Value Date/Time   CHOL 110 11/19/2017 0437   CHOL 119 08/09/2017 1000   TRIG 340 (H) 11/19/2017 0437   HDL 19 (L) 11/19/2017 0437   HDL 29 (L) 08/09/2017 1000   CHOLHDL 5.8 11/19/2017 0437   VLDL 68 (H) 11/19/2017 0437   LDLCALC 23 11/19/2017 0437   LDLCALC 30 08/09/2017 1000   HgbA1c:  Lab Results  Component Value Date   HGBA1C 9.1 (H) 11/19/2017   Urine Drug Screen: No results found for: LABOPIA, COCAINSCRNUR, LABBENZ, AMPHETMU, THCU, LABBARB  Alcohol Level No results found for: ETH  IMAGING Ct Angio Head W Or Wo Contrast  Ct Angio Neck W Or Wo Contrast 11/18/2017 Since the study  of earlier today, there are only 2 changes. There is new consolidation and volume loss in the right upper lobe. 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 Flow is seen distal to that however, presumably indicating patent posterior communicating arteries.   Ct Angio Head W Or Wo Contrast Ct Angio Neck W Or Wo Contrast 11/18/2017 1. Severe intracranial and extracranial atherosclerotic disease. No emergent large vessel occlusion  2. Severe stenosis right carotid bifurcation due to calcified plaque, estimated 90% diameter stenosis. No significant left carotid stenosis. Severe stenosis cavernous carotid bilaterally.  3. Both vertebral arteries are patent in the neck with severe calcific stenosis V4 segment bilaterally.  4. 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 Small calcified lymph nodes left neck suspicious for metastatic disease  5. Small lung nodules on the right, possible metastatic disease. Recommend chest CT for further evaluation.   Ct Head Wo Contrast 11/19/2017 1. Expected evolution of brainstem infarct without hemorrhage.  2. No new infarcts.  3. Left vertebral artery and basilar artery stent.  4. Acute on chronic sinus disease may be related to intubation.   Ct Chest W Contrast 11/18/2017 Right upper lobe airspace opacity is noted with air bronchograms concerning for pneumonia or possibly atelectasis. Stable mediastinal adenopathy is noted compared to prior exam. Endotracheal and nasogastric tubes are in grossly good position. Coronary artery calcifications are noted suggesting coronary artery disease. Possible 2.8 cm left thyroid nodule. Thyroid ultrasound is recommended for further evaluation. Aortic Atherosclerosis (ICD10-I70.0).   Mr Brain Wo Contrast 11/19/2017 1. 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 No associated hemorrhage or significant mass effect.  2.  Additional punctate acute/early subacute infarctions within left lateral cerebellum and right parietal cortex.  3. Minimal chronic microvascular ischemic changes of the brain and mild parenchymal volume loss.   Ct Head Code Stroke Wo Contrast 11/18/2017 1. No acute intracranial abnormality.  Atherosclerotic calcification  2. ASPECTS is 10   Cerebral Angiogram / Stent  11/18/2017 1. 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 Occluded non dominant RT VBJ. 2. S/P endovascular revascularization of the distal basilar artery  To 90 % patency , and of 30 to 40 % of the LT VBJ with stent placement. Dominant  Lt Pcom.  Transthoracic Echocardiogram - Left ventricle: The cavity size was moderately dilated. Wall thickness was increased in a pattern of severe LVH. Systolic function was normal. The estimated ejection fraction was in the range of 55% to 60%. Wall motion was normal; there were no regional wall motion abnormalities. The study is not technically sufficient to allow evaluation of LV diastolic function. - Aortic valve: Trileaflet. Sclerosis without stenosis. There was no regurgitation. Valve area (Vmax): 2.88 cm^2. - Aorta: Aortic root ML diameter: 41.95 mm (ED). - Aortic root: The aortic root is mildly dilated. - Mitral valve: Calcified annulus. There was trivial regurgitation. - Left atrium: The atrium was mildly dilated. - Right ventricle: The cavity size was mildly dilated. Mildly reduced systolic function. - Inferior vena cava: The vessel was normal in size. The respirophasic diameter changes were in the normal range (>= 50%), consistent with normal central venous pressure. Impressions:  Compared to a prior study in 02/2017, there have been no significant changes.  Ct Head Wo Contrast 11/24/2017 1. Continued normal expected interval evolution of brainstem infarct. No evidence for hemorrhagic transformation or other complication.   2. No other new intracranial abnormality.  3. Moderate paranasal sinus disease with bilateral mastoid effusions, likely related intubation.   Dg Chest Port 1 View 11/25/2017 Mild vascular congestion. Tubes and lines as described.   Ct Soft Tissue Neck W Contrast 11/26/2017 Aside from the fact that the patient shows endotracheal intubation and the presence of an orogastric tube, and aside from development of bilateral layering pleural effusions with dependent atelectasis, the findings are unchanged since the previous studies. Irregular mass arising from the inferior left thyroid lobe, measuring approximately 5 x 3.4 x 5.5 cm, with invasion of the surrounding tissues worrisome for thyroid carcinoma. Partial encasement of the trachea. Broad surface along the esophagus. Small but likely pathologic nodes in the lower neck and superior mediastinum, some with calcification, probably involved by metastatic disease.   Dg Chest Port 1 View 11/29/2017 Tube and catheter positions as described without pneumothorax. There is pulmonary vascular congestion with interstitial edema and small pleural effusions. Suspect  a degree of underlying congestive heart failure. No airspace consolidation.     PHYSICAL EXAM General - morbid obesity,middle aged Caucasian male, intubated on sedation.  Ophthalmologic - fundi not visualized due to noncooperation.  Cardiovascular - irregularly irregular heart rate and rhythm, afib RVR.  Neuro - intubated on sedation,    Eyes open but not blinking to visual threat, pinpoint pupils, not reactive to light, eye dysconjugate with right eye inward position, right eye mid position, able to voluntarily move the eyes in the vertical plane and partially to the left past midline but not able to look to the right. Mild right eye droopy.Has saccadic dysmetria. Weak gag.and has respiratory effort above ventilator setting.  . Facial symmetry not able to test due to ET tube, not able to move tongue  or mouth on command. No movement on all extremities, DTR diminished and no babinski. Sensation, coordination and gait not tested.   ASSESSMENT/PLAN [name] is a 62 y.o. male with PMH of DM, hemoptysis, OSA, HTN, HLD, CAD, afib off Eliquis due to hemoptysis presenting with slurred speech, right sided weakness. Was intubated for hemoptysis prevention while giving tPA. Symptoms worsened and imaging confirmed BA occlusion. S/p IR with revascularization of BA as well as left VA s/p stenting.  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   MRI head - large acute/early subacute infarction within the central pons involving both sides, greater on the right.  CTA H&N - severe vascular athero including BA stenosis, b/l VA stenosis, right ICA proximal 90% stenosis, b/l cavernous ICA stenosis R>L.  Repeat CTA H/N - new distal BA occlusion  DSA - revascularization of the distal basilar artery to 90 % patency , and of 30 to 40 % of the LT VBJ with stent placement.  CT head 11/24/17 showed normal evolution of brainstem infarct, no acute change  2D Echo - EF 55-60%  LDL - 23  HgbA1c - 9.1  VTE prophylaxis - heparin subq  Eliquis (recently stopped) and plavix prior to admission, now on aspirin 81 mg daily and Brilinta . Now on IV heparin drip.   Ongoing aggressive stroke risk factor management  Therapy recommendations:  pending  Disposition:  Pending  Thyroid mass  CTA neck - right large thyroid mass compressing on trachea.   Thyroid mass larger than CT  chest in 08/2016  Evidence of tracheal erosion on bronch at this admission  CT neck with contrast showed thyroid mass concerning for carcinoma, encasement of trachea.    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   oncology consult   Dr Learta Codding appreciated  Respiratory failure  CCM on board  Current intubated on sedation  no cuff leak  Fighting with vent when off sedation Likely need trach  As his airways likely to be compromised by his thyroid cancer PAF  Currently in afib RVR with respiratory distress  On eliquis in the past but off due to hemoptysis  Hold off at this time due to hemoptysis and upcoming biopsy  On ASA and brilinta  Coreg bid for rate control  Cerebral vascular athero  CTA head and neck showed right ICA proximal 90% stenosis, left ICA proximal athero, b/l cavernous ICA severe athro R>L, BA severe stenosis, b/l V4 severe athero with right V4 occlusion  S/p left V4 stent and BA revascularization  On ASA and brilinta  Hemoptysis, improved  Since 08/2016 as per family  Not sure about etiology  TB ruled out in the past  Possible tracheal erosion from right thyroid mass   CT chest showed scattered long nodules, RUE suspected PNA  CT neck showed thyroid mass concerning for carcinoma, encasement of trachea.   CCM on board  Fever   Tmax 102.4->102.5->100.0->98.3->100.8  CXR - progression of bibasilar consolidation -> Mild vascular congestion is noted without focal confluent infiltrate  Off antibiotics  Hypertension  Stable on the low side  Now on coreg bid  Also on HCTZ for limb swelling . Long term BP goal normotensive.  Hyperlipidemia  Lipid lowering medication PTA: Crestor 10 mg daily  LDL 23, goal < 70  On Crestor  Continue statin at discharge  Diabetes  HgbA1c 9.1, goal < 7.0  Uncontrolled  Off insulin drip, CBG fluctuate  On lantus and SSI   CBG monitoring  Other Stroke Risk  Factors  Advanced age  Former cigarette smoker - quit, 40 pack year history  Morbid obesity, Body mass index is 44.92 kg/m.    Hx stroke/TIA  Coronary artery disease   PAD with claudication  OSA on CPAP  Other Active Problems  Leukocytosis, resolved WBC 9.3  Anemia 9.4   Hyponatremia 134  Plan  . Marland Kitchen Discussed with Dr. Lamonte Sakai, Dr. Learta Codding and ENT Dr. 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 Anticipate some recovery over time but he is likely to need prolonged ventilatory support, feeding tube. He also has metastatic thyroid cancer which requires major surgery and patient may not be a candidate for dizziness present poor neurological state. I had a long meeting with the patient's fiance, son, daughter and multiple family members along with Dr. Lamonte Sakai and palliative carepractitioner to explain his situation and family expressed desire to know patient's wishes. I met later this afternoon with the patient with Dr. Lamonte Sakai and palliative care nurse practitioner and specifically asked him to communicate with eye closure to indicate his wishes. Patient was asked multiple times and expressed a  desire to   have prolonged ventilatory support even though he knew that he would be quadriplegic and bedridden likely for the rest of his life..Continue IV heparin drip ,Dr. Redmond Baseman to arrange for tracheostomy to provide ventilatory support and will consult trauma team to get PEG tube early next next week. Decision about definitive treatment for thyroid cancer will have to be made next week and he may need to be transferred to a tertiary care center for complex thyroid surgery as Dr. Redmond Baseman feels this cannot be done here.  This patient is critically ill and at significant risk of neurological worsening, death and care requires constant monitoring of vital signs, hemodynamics,respiratory and  cardiac monitoring, extensive review of multiple databases, frequent neurological assessment, discussion with family, other specialists and medical decision making of high complexity.I have made any additions or clarifications directly to the above note.This critical care time does not reflect procedure time, or teaching time or supervisory time of PA/NP/Med Resident etc but could involve care discussion time.  I spent 90 minutes of neurocritical care  time  in the care of  this patient. Antony Contras, MD Medical Director [facility] Pager: [phone] 12/03/2017 3:30 PM    To contact Stroke Continuity provider, please refer to [email or web address] After hours, contact General Neurology

## 2017-12-03 NOTE — Progress Notes (Signed)
PULMONARY / CRITICAL CARE MEDICINE   Name: [name] [name] MRN: [id number] DOB: June 14, 1955    ADMISSION DATE:  11/18/2017 CONSULTATION DATE: 11/18/2017  REFERRING MD: Emergency department  CHIEF COMPLAINT: Stroke  HISTORY OF PRESENT ILLNESS:    [name] [name]  is a 62 year old [name] [name] with atrial fibrillation, formally on anticoagulation was stopped due to recent hemoptysis.  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  [name] [name] presented with an acute stroke 6/27, was intubated and potential tracheal erosion site covered with cuff balloon to facilitate TPA.  Unfortunately during the hospitalization is also experienced a second stroke and underwent mechanical thrombectomy, stent placement 7/1.  SUBJECTIVE:  No changes in neurological status, overall clinical status Appreciate input from Dr. Benay Spice with oncology, Dr. Redmond Baseman with ENT.  VITAL SIGNS: BP 101/67   Pulse 100   Temp 98.9 F (37.2 C) (Axillary)   Resp (!) 23   Ht 5\' 10"  (1.778 m)   Wt (!) 142 kg (313 lb 0.9 oz)   SpO2 95%   BMI 44.92 kg/m   HEMODYNAMICS:    VENTILATOR SETTINGS: Vent Mode: PSV;CPAP FiO2 (%):  [40 %] 40 % Set Rate:  [20 bmp] 20 bmp PEEP:  [8 cmH20] 8 cmH20 Pressure Support:  [15 cmH20-18 cmH20] 15 cmH20 Plateau Pressure:  [18 cmH20-20 cmH20] 18 cmH20  INTAKE / OUTPUT: I/O last 3 completed shifts: In: 4674.7 [I.V.:2314.7; NG/GT:2360] Out: 9480 [Urine:9235]  PHYSICAL EXAMINATION: General: Critically ill [name] [name], obese, ventilated, no distress on sedation HEENT: ET tube in place, no evidence hemoptysis, OG tube in place Neuro: Wakes to voice, able to communicate by blinking CV: Irregularly irregular PULM: Coarse bilaterally, no wheezing, no crackles, decreased GI: Somewhat protuberant, nontender, hypoactive bowel sounds Extremities: 2+ lower extremity edema Skin: No rash   Recent Labs  Lab 12/01/17 0341 12/02/17 0219 12/03/17 0558  NA 138 136 135  K 3.7  3.4* 3.2*  CL 101 94* 90*  CO2 30 33* 33*  BUN 19 22 26*  CREATININE 0.51* 0.58* 0.65  GLUCOSE 205* 147* 243*    Electrolytes Recent Labs  Lab 12/01/17 0341 12/02/17 0200 12/02/17 0219 12/03/17 0558  CALCIUM 9.1  --  9.7 9.8  MG 2.0 2.2  --  1.9  PHOS  --   --  4.2  --     CBC Recent Labs  Lab 12/01/17 0341 12/02/17 0200 12/03/17 0558  WBC 9.9 11.1* 11.4*  HGB 9.5* 10.3* 10.8*  HCT 31.0* 32.8* 34.6*  PLT 281 319 388    Coag's No results for input(s): APTT, INR in the last 168 hours.  Sepsis Markers No results for input(s): LATICACIDVEN, PROCALCITON, O2SATVEN in the last 168 hours.  ABG Recent Labs  Lab 11/27/17 0400 11/28/17 0425 11/29/17 0438  PHART 7.458* 7.460* 7.445  PCO2ART 37.0 40.0 39.1  PO2ART 78.0* 67.1* 71.0*    Liver Enzymes Recent Labs  Lab 12/02/17 0219  ALBUMIN 2.3*    Cardiac Enzymes No results for input(s): TROPONINI, PROBNP in the last 168 hours.  Glucose Recent Labs  Lab 12/02/17 1639 12/02/17 1942 12/03/17 0004 12/03/17 0355 12/03/17 0840 12/03/17 1154  GLUCAP 210* 239* 235* 203* 265* 248*    Imaging No results found. CULTURES: 6/27 MRSA PCR >> neg 11/21/2017 sputum>> normal flora  ANTIBIOTICS: 11/18/2017 Unasyn>> 7/2 7/2 Cefotan >> 7/6  SIGNIFICANT EVENTS: 11/18/2017 intubated Right cervical lymph node biopsy 7/8 >> metastatic papillary thyroid carcinoma  LINES/TUBES: 11/18/2017 endotracheal tube>> 6/27 OGT >> 6/27 Foley >>  DISCUSSION:  ASSESSMENT / PLAN:  PULMONARY A: Vent dependent respiratory failure Possible airway compromise due to paratracheal/thyroid cancer Pulmonary nodules, question metastatic Right upper lobe infiltrate at presentation, presumed pneumonia, culture negative COPD Tolerating PCV as maintenance mode, doing some pressure support ventilation.  Okay to continue as tolerated.  Unclear to me that [name] [name] has the ability to protect his airway, tolerate extubation and work of  breathing Stable steady diuresis, continue Lasix 40 mg every 8 hours Tracheal deviation and possible erosion due to his paratracheal mass, known metastatic papillary thyroid cancer.  Definitive treatment would include mass resection up to his trachea or possibly even including some of the trachea, then radioactive iodine/chemo.  [name] [name] could have a tracheostomy placed to facilitate continued care without mass resection though [name] [name] still be at risk for progressive erosion of his airway.  Definitive surgical treatment would have to be done at a tertiary center  CARDIOVASCULAR A:  History of  Paroxysmal atrial fibrillation, currently off anticoagulation for recent hemoptysis of 3 weeks duration     Coronary artery disease.   Peripheral vascular disease Metoprolol as needed Continue Crestor, carvedilol, heparin drip Continue Brilinta, aspirin Continue Lasix current dosing  RENAL  A:   Volume overload - remains positive 6.8 L  Hypokalemia P:   Continue Lasix dosing as described above Repeat single dose of metolazone 7/12 Replace electrolytes as indicated (potassium) Follow BMP, urine output  Metastatic thyroid cancer, large neck mass with possible associated airway compromise Appreciate Dr. Pascal Lux assistance with interventional radiology.  Metastatic papillary thyroid carcinoma.  As above tracheostomy could be possible to facilitate longer.  On mechanical ventilation.  Definitive therapy of his carcinoma would require mass resection followed by medical therapy   GASTROINTESTINAL A:   History of GERD Nutrition P:   PPI and tube feeding as ordered Dulcolax and Colace   HEMATOLOGIC A:   Chronic anticoagulation for atrial fibrillation Off anticoagulation for 3 weeks PTA due to hemoptysis Follow CBC, currently on aspirin, Brilinta, heparin infusion  INFECTIOUS A:   Observed aspiration, right upper lobe pneumonia P:   Treated; following off antibiotics  ENDOCRINE Diabetes mellitus -  ongoing hyperglycemia  P:   CBG q 4 SSI - resistant Lantus 45 units HS Increased novolog scheduled q 4 from 6 to 8 units on 7/10  PAD Patient had a cool right foot which resolved with removing his right femoral sheath.    NEUROLOGIC A:   Multiple strokes, initially received TPA and then underwent basilar artery thrombectomy and stent placement Appreciate neurology management.  Poor prognosis for meaningful recovery. Continue same anticoagulants, antiplatelets    FAMILY:  Family meeting performed with Neurology, Palliative Care on 7/12.  Address the contributing issues to his current clinical status, ventilator dependence.  Addressed his prognosis for meaningful recovery.  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  Likewise the interventions that would be necessary for continued support, including tracheostomy, were described.  Finally we talked about potential therapies and pros/cons of treating his thyroid cancer.  Questions were answered.  All agreed that an attempt should be made to speak with the patient [place] regarding these issues and see if [name] [name] can communicate information about his goals for his care, incorporate his wishes about his quality of life into the decision-making process.  We will try to do this today   Baltazar Apo, MD, PhD 12/03/2017, 12:33 PM [facility] [phone] or if no answer [phone]

## 2017-12-03 NOTE — Progress Notes (Signed)
IP PROGRESS NOTE  Subjective:   [name] [name] remains on the ventilator.  His girlfriend is at the bedside.  She reports he has a history of hemoptysis for greater than a year.  He has chronic exertional dyspnea.  Objective: Vital signs in last 24 hours: Blood pressure (!) 133/92, pulse (!) 119, temperature 98.9 F (37.2 C), temperature source Axillary, resp. rate (!) 24, height 5\' 10"  (1.778 m), weight (!) 313 lb 0.9 oz (142 kg), SpO2 95 %.  Intake/Output from previous day: 07/11 0701 - 07/12 0700 In: 3165.2 [I.V.:1585.2; NG/GT:1580] Out: 4885 [Urine:4885]  Physical Exam:  HEENT: ETT in place, neck without palpable mass Neurologic: Alert, blinks eyes to yes and no questions, not moving extremities   Lab Results: Recent Labs    12/02/17 0200 12/03/17 0558  WBC 11.1* 11.4*  HGB 10.3* 10.8*  HCT 32.8* 34.6*  PLT 319 388    BMET Recent Labs    12/02/17 0219 12/03/17 0558  NA 136 135  K 3.4* 3.2*  CL 94* 90*  CO2 33* 33*  GLUCOSE 147* 243*  BUN 22 26*  CREATININE 0.58* 0.65  CALCIUM 9.7 9.8    No results found for: CEA1  Studies/Results: No results found.  Medications: I have reviewed the patient's current medications.  Assessment/Plan: 1.  Papillary thyroid cancer  CT neck 11/26/2017- left thyroid mass with partial encasement of the trachea, small calcified lower cervical nodes, supraclavicular nodes, and superior mediastinal nodes concerning for metastatic disease  CT chest 11/18/2017- small mediastinal lymph nodes, stable compared to 08/23/2016, left thyroid nodule  Ultrasound-guided biopsy of a right cervical lymph node 11/29/2017-metastatic papillary carcinoma  2.  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   Diabetes  4.   Paroxysmal atrial fibrillation  5.  History of hemoptysis- potentially related to tracheal invasion from the thyroid mass, admission April 2018 with hemoptysis with bronchoscopy confirming bloody secretions from the left lower lung, no ulceration or mass was seen  6.  Ventilator dependent respiratory failure  7.  Peripheral vascular disease  8.  History of coronary artery disease  Mr. Wenke appears unchanged.  He has been diagnosed with metastatic thyroid cancer.  His acute prognosis appears related to his neurologic status following a CVA and respiratory failure.  There is no role for acute intervention with regard to the thyroid cancer.  I discussed treatment options for the thyroid cancer with his girlfriend.   I discussed the case with Dr. Lamonte Sakai.  The family and patient discussion with Dr. Leonie Man, Dr. Lamonte Sakai, and Romona Curls is noted.  Plan for tracheostomy noted.  Please call Oncology as needed.  I will check on him during the week of 12/06/2017.      LOS: 15 days   Betsy Coder, MD   12/03/2017, 3:49 PM

## 2017-12-03 NOTE — Progress Notes (Signed)
ANTICOAGULATION CONSULT NOTE   Pharmacy Consult for Heparin Indication: atrial fibrillation  No Known Allergies  Patient Measurements: Height: _0  (177.8 cm) Weight: (!) 316 lb 12.8 oz (143.7 kg) IBW/kg (Calculated) : 73 Heparin Dosing Weight: 109.5  Vital Signs: Temp: 99.6 F (37.6 C) (07/12 0006) Temp Source: Axillary (07/12 0006) BP: 103/72 (07/12 0200) Pulse Rate: 92 (07/12 0200)  Labs: Recent Labs    11/30/17 0510 11/30/17 0628  12/01/17 0341  12/02/17 0200 12/02/17 0219 12/02/17 1103 12/02/17 1800 12/03/17 0200  HGB 9.4*  --   --  9.5*  --  10.3*  --   --   --   --   HCT 30.9*  --   --  31.0*  --  32.8*  --   --   --   --   PLT 256  --   --  281  --  319  --   --   --   --   HEPARINUNFRC  --   --    < >  --    < > <0.10*  --  <0.10* 0.38 0.43  CREATININE  --  0.55*  --  0.51*  --   --  0.58*  --   --   --    < > = values in this interval not displayed.   Estimated Creatinine Clearance: 138.9 mL/min (A) (by C-G formula based on SCr of 0.58 mg/dL (L)).  Medical History: Past Medical History:  Diagnosis Date  . Collagen vascular disease ([place])   . Coronary artery disease   . GERD (gastroesophageal reflux disease)   . High cholesterol   . Hypertension   . OSA (obstructive sleep apnea)    "dx'd years ago; never have worn mask; mask never ordered" (04/01/2017)  . Pneumonia 2011  . Type II diabetes mellitus ([place]) 07/2016   Medications:  Scheduled:  . aspirin  81 mg Oral Daily   Or  . aspirin  81 mg Per Tube Daily  . carvedilol  6.25 mg Oral BID WC  . chlorhexidine gluconate (MEDLINE KIT)  15 mL Mouth Rinse BID  . Chlorhexidine Gluconate Cloth  6 each Topical Q0600  . clonazepam  1 mg Oral TID  . docusate  100 mg Oral BID  . feeding supplement (PRO-STAT SUGAR FREE 64)  30 mL Per Tube TID  . furosemide  40 mg Intravenous Q8H  . insulin aspart  0-20 Units Subcutaneous Q4H  . insulin aspart  8 Units Subcutaneous Q4H  . insulin glargine  45 Units  Subcutaneous QHS  . ipratropium-albuterol  3 mL Nebulization Q4H  . mouth rinse  15 mL Mouth Rinse 10 times per day  . pantoprazole sodium  40 mg Per Tube Daily  . rosuvastatin  20 mg Per Tube q1800  . sodium chloride flush  10-40 mL Intracatheter Q12H  . ticagrelor  90 mg Oral BID   Or  . ticagrelor  90 mg Per Tube BID   Assessment: 62 YO male admitted on 6/27 for stroke and was found to have a mass on his thyroid causing hemoptysis. Biopsy results have identified metastatic papillary thyroid carcinoma. Patient was on Eliquis PTA for atrial fibrillation but last dose was 3 weeks PTA. Patient is currently on Aspirin and Brilinta. Pharmacy has been consulted to dose heparin for further anticoagulation. CBC stable. No s/sx of bleeding documented.  Heparin level remains within goal range (0.43)  Goal of Therapy: Heparin level 0.3-0.5 units/ml Monitor platelets by anticoagulation  protocol: Yes   Plan:  - Continue heparin to 3100 units/hr (31 ml/hr) - Will continue to monitor for any signs/symptoms of bleeding -Daily heparin level and CBC while on heparin  -F/u with goals of care and anticoagulation plan  Thank you for allowing pharmacy to be a part of this patient's care.  Excell Seltzer, PharmD Clinical Pharmacist 12/03/2017 3:25 AM Please check AMION for all [facility] numbers

## 2017-12-03 NOTE — Consult Note (Signed)
Consultation Note Date: 12/03/2017   Patient Name: [name] [name]  DOB: 12-05-1955  MRN: [id number]  Age / Sex: 62 y.o., male  PCP: Lorella Nimrod, MD Referring Physician: Garvin Fila, MD  Reason for Consultation: Establishing goals of care, Psychosocial/spiritual support and Withdrawal of life-sustaining treatment  HPI/Patient Profile: 62 y.o. male  with past medical history of HTN, DM2, a-fib ( off AC 2/2 hemoptysis), hemoptysis, HLD admitted on 11/18/2017 with left sided weakness, slurred speech.  He was given TPA and referred for a cerebral arteriogram.  He underwent endovascular revascularization of the distal basilar artery on 11/18/2017.  He was intubated for airway protection in the setting of hemoptysis.  There was evidence of bleeding and erosion at 3 cm below the vocal cords.  His stroke symptoms progressed in the hospital with resultant quadriplegia, a 6th nerve palsy, and 9 nystagmus.  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  An MRI of the brain revealed an acute/subacute infarct in the central pons.  He was taken off of Eliquis prior to hospital admission secondary to hemoptysis.  A CT of the neck on 11/18/2017 revealed a 2.5 x 4 cm left thyroid mass with small calcified left neck lymph node suspicious for metastatic disease. A CT of the chest on 11/18/2017 revealed right upper lobe airspace disease, mediastinal adenopathy, and a 2.8 cm left thyroid nodule.  He was referred for an ultrasound-guided biopsy of a right cervical lymph node on 11/29/2017.  The pathology revealed metastatic papillary thyroid carcinoma.  He remains intubated.  He is not moving, but is able to blink his eyes to command.  Consult orderd for GOC    Clinical Assessment and Goals of Care: Large family mtg held with pt's SO, 3 children, 2 step children, brothers, and DIL. Dr Leonie Man, neurology as well as Dr. Lamonte Sakai with  CCM. 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  Pt verbalized in ED upon admission that he would want his SO, [name] to be his Air traffic controller. It is not clear if this has been designated in writing. He does have 3 adult children who are also involved in health care discussions and decision making.    SUMMARY OF RECOMMENDATIONS   Plan is to ask pt what his goals in setting of catastrophic CVA, new metastatic thyroid cancer, total debility Tentative plan for CCM, neuro to meet with pt at 2pm and begin these discussions  Addendum 1500: Dr. Lamonte Sakai with CCM, Dr. Leonie Man with neurology and myself met with patient.  Discussed with patient his current clinical condition and asked him whether he would like to continue with further treatment knowing the severity of his condition, specifically quadriplegia, as well as ongoing need for life support,unable to return to living in his home.  Patient at this point is communicating by blinking once for yes twice for no or keeping his eyes closed for yes and  then opening upon request.  He demonstrated several times that he would like to proceed forward as indicated by eyeblink.  It of course is very difficult to know patient's level of executive function in terms of decision-making but family is comfortable with proceeding forward with trach after this initial conversation with patient  They also indicated that they would not be opposed to a transfer to a tertiary care center for a trach if that is indicated Code Status/Advance Care Planning:  Full code    Symptom Management:   Agitation: Continue with Precedex, medications as ordered by CCM  Pain: Continue with fentanyl as needed  Palliative Prophylaxis:   Aspiration, Bowel Regimen, Delirium Protocol, Eye Care, Frequent Pain Assessment,  Oral Care and Turn Reposition  Additional Recommendations (Limitations, Scope, Preferences):  Full Scope Treatment  Psycho-social/Spiritual:   Desire for further Chaplaincy support:no  Additional Recommendations: Grief/Bereavement Support  Prognosis:   Unable to determine  Discharge Planning: To Be Determined      Primary Diagnoses: Present on Admission: . Stroke (cerebrum) ([place]) . Basilar artery occlusion   I have reviewed the medical record, interviewed the patient and family, and examined the patient. The following aspects are pertinent.  Past Medical History:  Diagnosis Date  . Collagen vascular disease ([place])   . Coronary artery disease   . GERD (gastroesophageal reflux disease)   . High cholesterol   . Hypertension   . OSA (obstructive sleep apnea)    "dx'd years ago; never have worn mask; mask never ordered" (04/01/2017)  . Pneumonia 2011  . Type II diabetes mellitus ([place]) 07/2016   Social History   Socioeconomic History  . Marital status: Divorced    Spouse name: Not on file  . Number of children: Not on file  . Years of education: Not on file  . Highest education level: Not on file  Occupational History  . Not on file  Social Needs  . Financial resource strain: Not on file  . Food insecurity:    Worry: Not on file    Inability: Not on file  . Transportation needs:    Medical: Not on file    Non-medical: Not on file  Tobacco Use  . Smoking status: Former Smoker    Packs/day: 1.00    Years: 40.00    Pack years: 40.00    Types: Cigarettes    Last attempt to quit: 2014    Years since quitting: 5.5  . Smokeless tobacco: Never Used  Substance and Sexual Activity  . Alcohol use: Yes    Comment: 04/01/2017 "couple drinks once/month"  . Drug use: No    Types: Marijuana    Comment: marijuana in the 70s  . Sexual activity: Not Currently  Lifestyle  . Physical activity:    Days per week: Not on file    Minutes per session: Not on file  . Stress:  Not on file  Relationships  . Social connections:    Talks on phone: Not on file    Gets together: Not on file    Attends religious service: Not on file    Active member of club or organization: Not on file    Attends meetings of clubs or organizations: Not on file    Relationship status: Not on file  Other Topics Concern  . Not on file  Social History Narrative  . Not on file   Family History  Problem Relation Age of Onset  . Alzheimer's disease Mother   . Breast cancer  Sister    Scheduled Meds: . aspirin  81 mg Oral Daily   Or  . aspirin  81 mg Per Tube Daily  . carvedilol  6.25 mg Oral BID WC  . chlorhexidine gluconate (MEDLINE KIT)  15 mL Mouth Rinse BID  . Chlorhexidine Gluconate Cloth  6 each Topical Q0600  . clonazepam  1 mg Oral TID  . docusate  100 mg Oral BID  . feeding supplement (PRO-STAT SUGAR FREE 64)  30 mL Per Tube TID  . furosemide  40 mg Intravenous Q8H  . insulin aspart  0-20 Units Subcutaneous Q4H  . insulin aspart  8 Units Subcutaneous Q4H  . insulin glargine  45 Units Subcutaneous QHS  . ipratropium-albuterol  3 mL Nebulization TID  . mouth rinse  15 mL Mouth Rinse 10 times per day  . pantoprazole sodium  40 mg Per Tube Daily  . rosuvastatin  20 mg Per Tube q1800  . sodium chloride flush  10-40 mL Intracatheter Q12H  . ticagrelor  90 mg Oral BID   Or  . ticagrelor  90 mg Per Tube BID   Continuous Infusions: . dexmedetomidine (PRECEDEX) IV infusion 0.7 mcg/kg/hr (12/03/17 1100)  . feeding supplement (VITAL HIGH PROTEIN) 1,000 mL (12/03/17 0030)  . fentaNYL infusion INTRAVENOUS 100 mcg/hr (12/03/17 1100)  . heparin 3,100 Units/hr (12/03/17 1100)   PRN Meds:.acetaminophen **OR** acetaminophen (TYLENOL) oral liquid 160 mg/5 mL **OR** acetaminophen, bisacodyl, fentaNYL, fentaNYL (SUBLIMAZE) injection, lidocaine (PF), metoprolol tartrate, midazolam, sodium chloride flush Medications Prior to Admission:  Prior to Admission medications   Medication Sig  Start Date End Date Taking? Authorizing Provider  apixaban (ELIQUIS) 5 MG TABS tablet Take 1 tablet (5 mg total) 2 (two) times daily by mouth. 04/05/17  Yes Revankar, Reita Cliche, MD  dronedarone (MULTAQ) 400 MG tablet Take 1 tablet (400 mg total) 2 (two) times daily with a meal by mouth. 04/05/17  Yes Revankar, Reita Cliche, MD  irbesartan (AVAPRO) 75 MG tablet TAKE ONE TABLET BY MOUTH ONE TIME DAILY  05/31/17  Yes Tanda Rockers, MD  liraglutide (VICTOZA) 18 MG/3ML SOPN Inject 0.2 mLs (1.2 mg total) into the skin daily at 10 pm. Start with 0.6 mg for one week, then increase to 1.2 mg daily. 06/25/17  Yes Lorella Nimrod, MD  metoprolol tartrate (LOPRESSOR) 50 MG tablet Take 1 tablet (50 mg total) by mouth 2 (two) times daily. 11/01/17 04/30/18 Yes Revankar, Reita Cliche, MD  Omega-3 Fatty Acids (FISH OIL) 1000 MG CAPS Take 2 capsules (2,000 mg total) by mouth 2 (two) times daily. 08/23/17  Yes Revankar, Reita Cliche, MD  pantoprazole (PROTONIX) 40 MG tablet TAKE 1 TABLET BY MOUTH ONCE A DAY 30 TO 60 MINUTES BEFORE FIRST MEAL OF THE DAY  07/09/17  Yes Tanda Rockers, MD  B Complex Vitamins (VITAMIN B COMPLEX PO) Take 1 capsule at bedtime by mouth.     [provider]  clopidogrel (PLAVIX) 75 MG tablet Take 1 tablet (75 mg total) by mouth daily. Patient not taking: Reported on 11/20/2017 09/11/16   Maryellen Pile, MD  famotidine (PEPCID) 20 MG tablet One at bedtime 01/15/17   Tanda Rockers, MD  fenofibrate 160 MG tablet Take 1 tablet (160 mg total) by mouth daily. 06/14/17   Revankar, Reita Cliche, MD  metFORMIN (GLUCOPHAGE) 1000 MG tablet Take 1,000 mg by mouth 2 (two) times daily with a meal.    [provider]  nitroGLYCERIN (NITROSTAT) 0.4 MG SL tablet Place 1 tablet (0.4  mg total) under the tongue every 5 (five) minutes as needed for chest pain. 08/13/17 11/18/17  Revankar, Reita Cliche, MD  rosuvastatin (CRESTOR) 10 MG tablet Take 5 mg by mouth at bedtime.    [provider]   No Known Allergies Review of  Systems  Unable to perform ROS: Intubated    Physical Exam  Constitutional: He appears well-developed and well-nourished.  Acutely ill appearing man; intubated  Eyes:  Can blink his eyes  Pulmonary/Chest:  intubated  Abdominal: Soft.  Genitourinary:  Genitourinary Comments: foley  Musculoskeletal:  quadraplegia  Neurological:  Can blink his eyes yes/no in response to simple questions  Skin: Skin is warm and dry.  Nursing note and vitals reviewed.   Vital Signs: BP 110/67   Pulse 94   Temp 98.9 F (37.2 C) (Axillary)   Resp (!) 28   Ht 5' 10"  (1.778 m)   Wt (!) 142 kg (313 lb 0.9 oz)   SpO2 96%   BMI 44.92 kg/m  Pain Scale: CPOT   Pain Score: Asleep   SpO2: SpO2: 96 % O2 Device:SpO2: 96 % O2 Flow Rate: .   IO: Intake/output summary:   Intake/Output Summary (Last 24 hours) at 12/03/2017 1204 Last data filed at 12/03/2017 1100 Gross per 24 hour  Intake 3223.56 ml  Output 5635 ml  Net -2411.44 ml    LBM: Last BM Date: 12/02/17 Baseline Weight: Weight: (!) 141.6 kg (312 lb 2.7 oz) Most recent weight: Weight: (!) 142 kg (313 lb 0.9 oz)     Palliative Assessment/Data:   Flowsheet Rows     Most Recent Value  Intake Tab  Referral Department  Critical care  Unit at Time of Referral  ICU  Palliative Care Primary Diagnosis  Neurology  Date Notified  12/02/17  Palliative Care Type  New Palliative care  Reason for referral  Clarify Goals of Care  Date of Admission  11/18/17  Date first seen by Palliative Care  12/03/17  # of days Palliative referral response time  1 Day(s)  # of days IP prior to Palliative referral  14  Clinical Assessment  Palliative Performance Scale Score  30%  Pain Max last 24 hours  Not able to report  Pain Min Last 24 hours  Not able to report  Dyspnea Max Last 24 Hours  Not able to report  Dyspnea Min Last 24 hours  Not able to report  Nausea Max Last 24 Hours  Not able to report  Nausea Min Last 24 Hours  Not able to report    Anxiety Max Last 24 Hours  Not able to report  Anxiety Min Last 24 Hours  Not able to report  Other Max Last 24 Hours  Not able to report  Psychosocial & Spiritual Assessment  Palliative Care Outcomes  Patient/Family meeting held?  Yes  Who was at the meeting?  SO, 3 children, 2 step sons, DIL, brother  Palliative Care follow-up planned  Yes, Facility      Time In: 1000 and 1400 Time Out: 1120 and 1445 Time Total: 80 min plus 45 min=125 min Greater than 50%  of this time was spent counseling and coordinating care related to the above assessment and plan. Staffed with Dr. Leonie Man and Dr. Lamonte Sakai  Signed by: Dory Horn, NP   Please contact Palliative Medicine Team phone at [phone] for questions and concerns.  For individual provider: See Shea Evans

## 2017-12-03 NOTE — Progress Notes (Signed)
PCCM Interval Note  I participated in family meeting with Dr Leonie Man, with Judson Roch from palliative care. All issues, prognosis, therapeutics were discussed regarding his strokes, current support, his malignancy. All questions answered.   Then Dr Leonie Man, Judson Roch and I spoke with Legrand Como with his RN present. Did our best to explain the neurological findings and prognosis, the potential for prolonging his care but with the likelihood of no meaningful neuro recovery. He was able to communicate by closing his eyes.   Gerasimos expressed understanding that he may likely remain paralyzed or ventilated for life. He expressed wish to continue to live, even under such circumstances. We did not speak in detail about his malignancy.   Based on his expressed wishes, I believe that we will need to proceed with tracheostomy and PEG. I reviewed the case with Dr Melida Quitter this am - he believes that it will be possible to perform a trach here at [facility]. I propose that we call Dr Redmond Baseman new week to coordinate.   With regard to any downstream mass debulking or tracheal resection, we would need to weigh pros / cons, then consider transfer to a tertiary center to have this done. Clearly this would need to be coordinated with ENT and Oncology at the receiving institution.   Independent CC time 60 minutes   Baltazar Apo, MD, PhD 12/03/2017, 4:51 PM [facility] [phone] or if no answer [phone]

## 2017-12-04 DIAGNOSIS — Z4659 Encounter for fitting and adjustment of other gastrointestinal appliance and device: Secondary | ICD-10-CM

## 2017-12-04 DIAGNOSIS — R739 Hyperglycemia, unspecified: Secondary | ICD-10-CM | POA: Diagnosis present

## 2017-12-04 DIAGNOSIS — R509 Fever, unspecified: Secondary | ICD-10-CM

## 2017-12-04 LAB — CBC
HCT: 33.5 % — ABNORMAL LOW (ref 39.0–52.0)
HEMOGLOBIN: 10.5 g/dL — AB (ref 13.0–17.0)
MCH: 27.6 pg (ref 26.0–34.0)
MCHC: 31.3 g/dL (ref 30.0–36.0)
MCV: 87.9 fL (ref 78.0–100.0)
Platelets: 363 10*3/uL (ref 150–400)
RBC: 3.81 MIL/uL — AB (ref 4.22–5.81)
RDW: 13.7 % (ref 11.5–15.5)
WBC: 9.3 10*3/uL (ref 4.0–10.5)

## 2017-12-04 LAB — BASIC METABOLIC PANEL
ANION GAP: 9 (ref 5–15)
BUN: 27 mg/dL — ABNORMAL HIGH (ref 8–23)
CHLORIDE: 90 mmol/L — AB (ref 98–111)
CO2: 35 mmol/L — ABNORMAL HIGH (ref 22–32)
Calcium: 10.2 mg/dL (ref 8.9–10.3)
Creatinine, Ser: 0.66 mg/dL (ref 0.61–1.24)
Glucose, Bld: 260 mg/dL — ABNORMAL HIGH (ref 70–99)
POTASSIUM: 3.6 mmol/L (ref 3.5–5.1)
SODIUM: 134 mmol/L — AB (ref 135–145)

## 2017-12-04 LAB — GLUCOSE, CAPILLARY
GLUCOSE-CAPILLARY: 244 mg/dL — AB (ref 70–99)
GLUCOSE-CAPILLARY: 236 mg/dL — AB (ref 70–99)
GLUCOSE-CAPILLARY: 273 mg/dL — AB (ref 70–99)
GLUCOSE-CAPILLARY: 228 mg/dL — AB (ref 70–99)
Glucose-Capillary: 247 mg/dL — ABNORMAL HIGH (ref 70–99)
Glucose-Capillary: 264 mg/dL — ABNORMAL HIGH (ref 70–99)

## 2017-12-04 LAB — HEPARIN LEVEL (UNFRACTIONATED): Heparin Unfractionated: 0.53 IU/mL (ref 0.30–0.70)

## 2017-12-04 LAB — MAGNESIUM: Magnesium: 1.8 mg/dL (ref 1.7–2.4)

## 2017-12-04 MED ORDER — DEXMEDETOMIDINE HCL IN NACL 400 MCG/100ML IV SOLN
0.4000 ug/kg/h | INTRAVENOUS | Status: DC
  Administered 2017-12-04 – 2017-12-05 (×7): 0.6 ug/kg/h via INTRAVENOUS
  Administered 2017-12-06: 0.4 ug/kg/h via INTRAVENOUS
  Administered 2017-12-06: 0.7 ug/kg/h via INTRAVENOUS
  Administered 2017-12-06 (×2): 0.6 ug/kg/h via INTRAVENOUS
  Administered 2017-12-06 – 2017-12-07 (×2): 0.5 ug/kg/h via INTRAVENOUS
  Administered 2017-12-07: 0.7 ug/kg/h via INTRAVENOUS
  Administered 2017-12-07: 0.6 ug/kg/h via INTRAVENOUS
  Filled 2017-12-04 (×7): qty 100
  Filled 2017-12-04: qty 200
  Filled 2017-12-04 (×2): qty 100
  Filled 2017-12-04: qty 200
  Filled 2017-12-04 (×4): qty 100

## 2017-12-04 MED ORDER — POTASSIUM CHLORIDE 20 MEQ/15ML (10%) PO SOLN
40.0000 meq | Freq: Three times a day (TID) | ORAL | Status: AC
Start: 2017-12-04 — End: 2017-12-04
  Administered 2017-12-04 (×2): 40 meq
  Filled 2017-12-04 (×2): qty 30

## 2017-12-04 MED ORDER — METOLAZONE 10 MG PO TABS
10.0000 mg | ORAL_TABLET | Freq: Once | ORAL | Status: AC
  Administered 2017-12-04: 10 mg via ORAL
  Filled 2017-12-04 (×2): qty 1

## 2017-12-04 MED ORDER — MAGNESIUM SULFATE 2 GM/50ML IV SOLN
2.0000 g | Freq: Once | INTRAVENOUS | Status: AC
  Administered 2017-12-04: 2 g via INTRAVENOUS
  Filled 2017-12-04: qty 50

## 2017-12-04 NOTE — Progress Notes (Signed)
PULMONARY / CRITICAL CARE MEDICINE   Name: [name] [name] MRN: [id number] DOB: June 21, 1955    ADMISSION DATE:  11/18/2017 CONSULTATION DATE: 11/18/2017  REFERRING MD: Emergency department  CHIEF COMPLAINT: Stroke  HISTORY OF PRESENT ILLNESS:    [name] [name]  is a 62 year old man with atrial fibrillation, formally on anticoagulation was stopped due to recent hemoptysis.  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  He presented with an acute stroke 6/27, was intubated and potential tracheal erosion site covered with cuff balloon to facilitate TPA.  Unfortunately during the hospitalization is also experienced a second stroke and underwent mechanical thrombectomy, stent placement 7/1.  SUBJECTIVE:  No events overnight, no new complaints  VITAL SIGNS: BP 118/81   Pulse 93   Temp (!) 100.6 F (38.1 C) (Axillary)   Resp (!) 24   Ht 5\' 10"  (1.778 m)   Wt (!) 310 lb 6.5 oz (140.8 kg)   SpO2 97%   BMI 44.54 kg/m   HEMODYNAMICS:    VENTILATOR SETTINGS: Vent Mode: CPAP;PSV FiO2 (%):  [40 %] 40 % Set Rate:  [20 bmp] 20 bmp PEEP:  [8 cmH20] 8 cmH20 Pressure Support:  [15 cmH20] 15 cmH20 Plateau Pressure:  [18 cmH20-21 cmH20] 18 cmH20  INTAKE / OUTPUT: I/O last 3 completed shifts: In: 5509.1 [I.V.:2369.2; NG/GT:2805; IV Piggyback:334.9] Out: 6760 [Urine:6760]  PHYSICAL EXAMINATION: General: Acute on chronically ill appearing male, NAD, blinks to questions HEENT: ETT in place, [place]/AT, PERRL, EOM-I and MMM Neuro: Opens eyes and blinks to answer questions but not moving any ext. CV: IRIR, Nl S1/S2 and -M/R/G PULM: Coarse BS diffusely GI: Obese, soft, NT, ND and +BS Extremities: 2+ lower extremity edema Skin: No rash  Recent Labs  Lab 12/02/17 0219 12/03/17 0558 12/04/17 0500  NA 136 135 134*  K 3.4* 3.2* 3.6  CL 94* 90* 90*  CO2 33* 33* 35*  BUN 22 26* 27*  CREATININE 0.58* 0.65 0.66  GLUCOSE 147* 243* 260*     Electrolytes Recent Labs  Lab 12/02/17 0200 12/02/17 0219 12/03/17 0558 12/04/17 0500  CALCIUM  --  9.7 9.8 10.2  MG 2.2  --  1.9 1.8  PHOS  --  4.2  --   --     CBC Recent Labs  Lab 12/02/17 0200 12/03/17 0558 12/04/17 0500  WBC 11.1* 11.4* 9.3  HGB 10.3* 10.8* 10.5*  HCT 32.8* 34.6* 33.5*  PLT 319 388 363    Coag's No results for input(s): APTT, INR in the last 168 hours.  Sepsis Markers No results for input(s): LATICACIDVEN, PROCALCITON, O2SATVEN in the last 168 hours.  ABG Recent Labs  Lab 11/28/17 0425 11/29/17 0438  PHART 7.460* 7.445  PCO2ART 40.0 39.1  PO2ART 67.1* 71.0*    Liver Enzymes Recent Labs  Lab 12/02/17 0219  ALBUMIN 2.3*    Cardiac Enzymes No results for input(s): TROPONINI, PROBNP in the last 168 hours.  Glucose Recent Labs  Lab 12/03/17 0840 12/03/17 1154 12/03/17 1545 12/03/17 1941 12/03/17 2346 12/04/17 0428  GLUCAP 265* 248* 253* 213* 261* 244*    Imaging No results found.  CULTURES: 6/27 MRSA PCR >> neg 11/21/2017 sputum>> normal flora  ANTIBIOTICS: 11/18/2017 Unasyn>> 7/2 7/2 Cefotan >> 7/6  SIGNIFICANT EVENTS: 11/18/2017 intubated Right cervical lymph node biopsy 7/8 >> metastatic papillary thyroid carcinoma  LINES/TUBES: 11/18/2017 endotracheal tube>> 6/27 OGT >> 6/27 Foley >>  DISCUSSION:  ASSESSMENT / PLAN:  PULMONARY A: Vent dependent respiratory failure Possible airway compromise due to  paratracheal/thyroid mass Pulmonary nodules, question metastatic Right upper lobe infiltrate at presentation, presumed pneumonia, culture negative COPD Begin PS trials but no extubation Needs trach per family discussion yesterday VAP prevention order set Tolerating diuresis, continue Lasix 40 mg every 8 hours for now Tracheal deviation and possible erosion due to his paratracheal mass.  This is now been shown to be metastatic papillary thyroid carcinoma.  ENT to address tracheostomy  CARDIOVASCULAR A:   History of  Paroxysmal atrial fibrillation, currently off anticoagulation for recent hemoptysis of 3 weeks duration     Coronary artery disease.   Peripheral vascular disease Metoprolol as needed Continue Crestor, carvedilol Continue heparin drip Lasix at same dose as outlined above Continue Brilinta, aspirin  RENAL  A:   No acute renal issues - remains positive 9.1L  P:   Continue Lasix as described above Improved diuresis with metolazone 7/10, likely repeat on 7/13, give 10 mg PO x1 Replace electrolytes as indicated Follow BMP, urine output  Thyroid Mass with possible associated airway compromise Appreciate Dr. Pascal Lux assistance with interventional radiology.  Metastatic papillary thyroid carcinoma.  I do not believe there is significant benefit to be conferred with tracheostomy.  This will only allow continue mechanical ventilation in a very debilitated state due to his strokes until he succumbs to his metastatic cancer.   GASTROINTESTINAL A:   History of GERD Nutrition P:   PPI as ordered Tube feeding at goal Dulcolax and colace   HEMATOLOGIC A:   Chronic anticoagulation for atrial fibrillation Off anticoagulation for 3 weeks due to hemoptysis Follow CBC on aspirin, Brilinta, heparin drip  INFECTIOUS A:   Observed aspiration, right upper lobe pneumonia P:   Following off antibiotics  ENDOCRINE Diabetes mellitus - ongoing hyperglycemia  P:   CBG q 4 SSI - resistant Lantus 45 units HS Increased novolog scheduled q 4 from 6 to 8 units on 7/10  PAD Patient had a cool right foot which resolved with removing his right femoral sheath.    NEUROLOGIC A:   Multiple strokes, initially received TPA and then underwent basilar artery thrombectomy and stent placement Appreciate neurology management.  Poor prognosis for neurological recovery Brilinta, aspirin  FAMILY: Daughter updated bedside 7/13  The patient is critically ill with multiple organ systems failure  and requires high complexity decision making for assessment and support, frequent evaluation and titration of therapies, application of advanced monitoring technologies and extensive interpretation of multiple databases.   Critical Care Time devoted to patient care services described in this note is  31  Minutes. This time reflects time of care of this signee Dr [name]. This critical care time does not reflect procedure time, or teaching time or supervisory time of PA/NP/Med student/Med Resident etc but could involve care discussion time.  Rush Farmer, M.D. [facility] Pulmonary/Critical Care Medicine. Pager: [phone]. After hours pager: [phone].  12/04/2017, 8:28 AM

## 2017-12-04 NOTE — Progress Notes (Signed)
Daily Progress Note   Patient Name: [name] [name]       Date: 12/04/2017 DOB: [birth year]  Age: 62 y.o. MRN#: [id number] Attending Physician: Garvin Fila, MD Primary Care Physician: Lorella Nimrod, MD Admit Date: 11/18/2017  Reason for Consultation/Follow-up: Establishing goals of care and Psychosocial/spiritual support  Subjective: Pt seen, chart reviewed. Son, Lamontae, and Damaris Hippo, at the bedside reading Jeralyn Bennett to pt. Per RN, pt had a "good night". No non-verbal signs of pain or agitation noted.   At this point plan is to proceed with a trach/PEG first of the week..  Length of Stay: 16  Current Medications: Scheduled Meds:  . carvedilol  6.25 mg Oral BID WC  . chlorhexidine gluconate (MEDLINE KIT)  15 mL Mouth Rinse BID  . Chlorhexidine Gluconate Cloth  6 each Topical Q0600  . clonazepam  1 mg Oral TID  . docusate  100 mg Oral BID  . feeding supplement (PRO-STAT SUGAR FREE 64)  30 mL Per Tube TID  . furosemide  40 mg Intravenous Q8H  . insulin aspart  0-20 Units Subcutaneous Q4H  . insulin aspart  8 Units Subcutaneous Q4H  . insulin glargine  45 Units Subcutaneous QHS  . ipratropium-albuterol  3 mL Nebulization TID  . mouth rinse  15 mL Mouth Rinse 10 times per day  . pantoprazole sodium  40 mg Per Tube Daily  . potassium chloride  40 mEq Per Tube TID  . rosuvastatin  20 mg Per Tube q1800  . sodium chloride flush  10-40 mL Intracatheter Q12H  . ticagrelor  90 mg Oral BID   Or  . ticagrelor  90 mg Per Tube BID    Continuous Infusions: . sodium chloride 10 mL/hr at 12/04/17 0900  . dexmedetomidine (PRECEDEX) IV infusion 0.6 mcg/kg/hr (12/04/17 0900)  . feeding supplement (VITAL HIGH PROTEIN) 1,000 mL (12/04/17 0904)  . fentaNYL infusion INTRAVENOUS 100 mcg/hr  (12/04/17 0900)  . heparin 3,100 Units/hr (12/04/17 0900)  . magnesium sulfate 1 - 4 g bolus IVPB 2 g (12/04/17 0942)    PRN Meds: sodium chloride, acetaminophen **OR** acetaminophen (TYLENOL) oral liquid 160 mg/5 mL **OR** acetaminophen, bisacodyl, fentaNYL, fentaNYL (SUBLIMAZE) injection, lidocaine (PF), metoprolol tartrate, midazolam, sodium chloride flush  Physical Exam  Constitutional:  Critically man, seen in neuro ICU No acute distress.  HENT:  Head: Normocephalic and atraumatic.  Cardiovascular: Normal rate.  Pulmonary/Chest:  Intubated  Abdominal: Soft.  Neurological:  somnolent but does open his eyes to voice; still blinking in response to simple yes/no questions  Skin: Skin is warm and dry. There is pallor.  Psychiatric:  No overt agitation otherwise unable to test  Nursing note and vitals reviewed.           Vital Signs: BP 120/75   Pulse (!) 49   Temp 98.4 F (36.9 C) (Axillary)   Resp (!) 24   Ht 5' 10"  (1.778 m)   Wt (!) 140.8 kg (310 lb 6.5 oz)   SpO2 100%   BMI 44.54 kg/m  SpO2: SpO2: 100 % O2 Device: O2 Device: Ventilator O2 Flow Rate:    Intake/output summary:   Intake/Output Summary (Last 24 hours) at 12/04/2017 1022 Last data filed at 12/04/2017 0900 Gross per 24 hour  Intake 3874.66 ml  Output 4175 ml  Net -300.34 ml   LBM: Last BM Date: 12/02/17 Baseline Weight: Weight: (!) 141.6 kg (312 lb 2.7 oz) Most recent weight: Weight: (!) 140.8 kg (310 lb 6.5 oz)       Palliative Assessment/Data:    Flowsheet Rows     Most Recent Value  Intake Tab  Referral Department  Critical care  Unit at Time of Referral  ICU  Palliative Care Primary Diagnosis  Neurology  Date Notified  12/02/17  Palliative Care Type  New Palliative care  Reason for referral  Clarify Goals of Care  Date of Admission  11/18/17  Date first seen by Palliative Care  12/03/17  # of days Palliative referral response time  1 Day(s)  # of days IP prior to Palliative  referral  14  Clinical Assessment  Palliative Performance Scale Score  30%  Pain Max last 24 hours  Not able to report  Pain Min Last 24 hours  Not able to report  Dyspnea Max Last 24 Hours  Not able to report  Dyspnea Min Last 24 hours  Not able to report  Nausea Max Last 24 Hours  Not able to report  Nausea Min Last 24 Hours  Not able to report  Anxiety Max Last 24 Hours  Not able to report  Anxiety Min Last 24 Hours  Not able to report  Other Max Last 24 Hours  Not able to report  Psychosocial & Spiritual Assessment  Palliative Care Outcomes  Patient/Family meeting held?  Yes  Who was at the meeting?  SO, 3 children, 2 step sons, DIL, brother  Palliative Care follow-up planned  Yes, Facility      Patient Active Problem List   Diagnosis Date Noted  . Goals of care, counseling/discussion   . Palliative care by specialist   . Thyroid mass   . Stroke (cerebrum) ([place]) 11/18/2017  . 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 Acute respiratory failure ([place])   . Claudication in peripheral vascular disease ([place]) 04/05/2017  . Hyperlipidemia LDL goal <70   . Diabetes mellitus due to underlying condition with unspecified complications ([place]) 09/62/8366  . PAF (paroxysmal atrial fibrillation) ([place]) 02/22/2017  . History of smoking 02/22/2017  . Upper airway cough syndrome 01/15/2017  . CAD (coronary artery disease) 09/16/2016  . Essential hypertension 09/11/2016  . Weight loss   . Night sweats   . OSA (obstructive sleep apnea)   . Morbid obesity due to excess calories ([place])   . Lung nodules   . Hemoptysis 08/23/2016  Palliative Care Assessment & Plan   Patient Profile: 62 y.o. male  with past medical history of HTN, DM2, a-fib ( off AC 2/2 hemoptysis), hemoptysis, HLD admitted on 11/18/2017 with left sided weakness, slurred speech.  He was given TPA and referred for a cerebral arteriogram. He underwent endovascular revascularization of the distal basilar artery on 11/18/2017. He  was intubated for airway protection in the setting of hemoptysis. There was evidence of bleeding and erosion at 3 cm below the vocal cords.  His stroke symptoms progressed in the hospital with resultant quadriplegia, a 6th nerve palsy, and 9 nystagmus. 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 An MRI of the brain revealed an acute/subacute infarct in the central pons.  He was taken off of Eliquis prior to hospital admission secondary to hemoptysis.  A CT of the neck on 11/18/2017 revealed a 2.5 x 4 cm left thyroid mass with small calcified left neck lymph node suspicious for metastatic disease. A CT of the chest on 11/18/2017 revealed right upper lobe airspace disease, mediastinal adenopathy, and a 2.8 cm left thyroid nodule.  He was referred for an ultrasound-guided biopsy of a right cervical lymph node on 11/29/2017. The pathology revealed metastatic papillary thyroid carcinoma.  He remains intubated. He is not moving, but is able to blink his eyes to command.  Consult orderd for [place]. Family mtg 12/03/17 as well as conversation with pt, plan is to proceed at this point with trach/PEG   Recommendations/Plan:  Pain: Cont fentanyl as per CCM  Agitation: Cont precedex per CCM  Brought 2 communication boards  to the room to assist in communication. Showed son and dtr how to use  Recommend that palliative medicine stay involved to continue to assist family in [place]. Pt is critically ill and high risk for further decompensation in the setting of CVA, thyroid cancer, coagulapathy, with plans for trach/PEG  Goals of Care and Additional Recommendations:  Limitations on Scope of Treatment: Full Scope Treatment  Code Status:    Code Status Orders  (From admission, onward)        Start     Ordered   11/18/17 1826  Full code  Continuous     11/18/17 1826    Code Status History    Date Active Date Inactive Code Status Order ID Comments User Context   11/18/2017 1101 11/18/2017 1826 Full Code  [id number]  Greta Doom, MD ED   04/01/2017 1318 04/02/2017 1507 Full Code [id number]  Lorretta Harp, MD Inpatient   08/23/2016 2015 08/26/2016 1949 Full Code [id number]  Milagros Loll, MD ED       Prognosis:   Unable to determine  Discharge Planning:  To Be Determined    Thank you for allowing the Palliative Medicine Team to assist in the care of this patient.   Time In: 1000 Time Out: 1020 Total Time 20 min Prolonged Time Billed  no       Greater than 50%  of this time was spent counseling and coordinating care related to the above assessment and plan.  Dory Horn, NP  Please contact Palliative Medicine Team phone at [phone] for questions and concerns.

## 2017-12-04 NOTE — Progress Notes (Signed)
STROKE TEAM PROGRESS NOTE    SUBJECTIVE (INTERVAL HISTORY) Son and brother are at the bedside.  Pt remains intubated without movement. able to open eyes on voice and following commands on eye movement. Remains on IV heparin drip, low dose fentanyl and precedex. Low grade fever. No limb movement.   OBJECTIVE Temp:  [98.9 F (37.2 C)-100.6 F (38.1 C)] 100.6 F (38.1 C) (07/13 0000) Pulse Rate:  [83-124] 91 (07/13 0434) Cardiac Rhythm: Atrial fibrillation (07/13 0000) Resp:  [15-31] 26 (07/13 0434) BP: (100-155)/(65-92) 109/65 (07/13 0300) SpO2:  [94 %-100 %] 98 % (07/13 0434) FiO2 (%):  [40 %] 40 % (07/13 0434) Weight:  [310 lb 6.5 oz (140.8 kg)] 310 lb 6.5 oz (140.8 kg) (07/13 0500)  CBC:  Recent Labs  Lab 11/28/17 0611 11/29/17 0500  12/03/17 0558 12/04/17 0500  WBC 8.8 9.3   < > 11.4* 9.3  NEUTROABS 6.6 6.8  --   --   --   HGB 10.1* 9.4*   < > 10.8* 10.5*  HCT 31.7* 30.6*   < > 34.6* 33.5*  MCV 88.3 90.3   < > 87.6 87.9  PLT 252 229   < > 388 363   < > = values in this interval not displayed.    Basic Metabolic Panel:  Recent Labs  Lab 12/02/17 0219 12/03/17 0558 12/04/17 0500  NA 136 135 134*  K 3.4* 3.2* 3.6  CL 94* 90* 90*  CO2 33* 33* 35*  GLUCOSE 147* 243* 260*  BUN 22 26* 27*  CREATININE 0.58* 0.65 0.66  CALCIUM 9.7 9.8 10.2  MG  --  1.9 1.8  PHOS 4.2  --   --     Lipid Panel:     Component Value Date/Time   CHOL 110 11/19/2017 0437   CHOL 119 08/09/2017 1000   TRIG 340 (H) 11/19/2017 0437   HDL 19 (L) 11/19/2017 0437   HDL 29 (L) 08/09/2017 1000   CHOLHDL 5.8 11/19/2017 0437   VLDL 68 (H) 11/19/2017 0437   LDLCALC 23 11/19/2017 0437   LDLCALC 30 08/09/2017 1000   HgbA1c:  Lab Results  Component Value Date   HGBA1C 9.1 (H) 11/19/2017   Urine Drug Screen: No results found for: LABOPIA, COCAINSCRNUR, LABBENZ, AMPHETMU, THCU, LABBARB  Alcohol Level No results found for: [place] I have personally reviewed the radiological images  below and agree with the radiology interpretations.  Ct Angio Head W Or Wo Contrast  Ct Angio Neck W Or Wo Contrast 11/18/2017 Since the study of earlier today, there are only 2 changes. There is new consolidation and volume loss in the right upper lobe. 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 Flow is seen distal to that however, presumably indicating patent posterior communicating arteries.   Ct Angio Head W Or Wo Contrast Ct Angio Neck W Or Wo Contrast 11/18/2017 1. Severe intracranial and extracranial atherosclerotic disease. No emergent large vessel occlusion  2. Severe stenosis right carotid bifurcation due to calcified plaque, estimated 90% diameter stenosis. No significant left carotid stenosis. Severe stenosis cavernous carotid bilaterally.  3. Both vertebral arteries are patent in the neck with severe calcific stenosis V4 segment bilaterally.  4. 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 Small calcified lymph nodes left neck suspicious for metastatic disease  5. Small lung nodules on the right, possible metastatic disease. Recommend chest CT for further evaluation.   Ct Head Wo Contrast 11/19/2017 1. Expected evolution of brainstem infarct  without hemorrhage.  2. No new infarcts.  3. Left vertebral artery and basilar artery stent.  4. Acute on chronic sinus disease may be related to intubation.   Ct Chest W Contrast 11/18/2017 Right upper lobe airspace opacity is noted with air bronchograms concerning for pneumonia or possibly atelectasis. Stable mediastinal adenopathy is noted compared to prior exam. Endotracheal and nasogastric tubes are in grossly good position. Coronary artery calcifications are noted suggesting coronary artery disease. Possible 2.8 cm left thyroid nodule. Thyroid ultrasound is recommended for further evaluation. Aortic Atherosclerosis (ICD10-I70.0).   Mr Brain Wo Contrast 11/19/2017 1. Acute/early subacute infarction within the  central pons involving both sides, greater on the right, measuring up to 2.6 cm, 3.6 cc. No associated hemorrhage or significant mass effect.  2. Additional punctate acute/early subacute infarctions within left lateral cerebellum and right parietal cortex.  3. Minimal chronic microvascular ischemic changes of the brain and mild parenchymal volume loss.   Ct Head Code Stroke Wo Contrast 11/18/2017 1. No acute intracranial abnormality.  Atherosclerotic calcification  2. ASPECTS is 10   Cerebral Angiogram / Stent  11/18/2017 1. 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 Occluded non dominant RT VBJ. 2. S/P endovascular revascularization of the distal basilar artery  To 90 % patency , and of 30 to 40 % of the LT VBJ with stent placement. Dominant  Lt Pcom.  Transthoracic Echocardiogram - Left ventricle: The cavity size was moderately dilated. Wall thickness was increased in a pattern of severe LVH. Systolic function was normal. The estimated ejection fraction was in the range of 55% to 60%. Wall motion was normal; there were no regional wall motion abnormalities. The study is not technically sufficient to allow evaluation of LV diastolic function. - Aortic valve: Trileaflet. Sclerosis without stenosis. There was no regurgitation. Valve area (Vmax): 2.88 cm^2. - Aorta: Aortic root ML diameter: 41.95 mm (ED). - Aortic root: The aortic root is mildly dilated. - Mitral valve: Calcified annulus. There was trivial regurgitation. - Left atrium: The atrium was mildly dilated. - Right ventricle: The cavity size was mildly dilated. Mildly reduced systolic function. - Inferior vena cava: The vessel was normal in size. The respirophasic diameter changes were in the normal range (>= 50%), consistent with normal central venous pressure. Impressions:  Compared to a prior study in 02/2017, there have been no significant changes.  Ct Head Wo  Contrast 11/24/2017 1. Continued normal expected interval evolution of brainstem infarct. No evidence for hemorrhagic transformation or other complication.  2. No other new intracranial abnormality.  3. Moderate paranasal sinus disease with bilateral mastoid effusions, likely related intubation.   Dg Chest Port 1 View 11/25/2017 Mild vascular congestion. Tubes and lines as described.   Ct Soft Tissue Neck W Contrast 11/26/2017 Aside from the fact that the patient shows endotracheal intubation and the presence of an orogastric tube, and aside from development of bilateral layering pleural effusions with dependent atelectasis, the findings are unchanged since the previous studies. Irregular mass arising from the inferior left thyroid lobe, measuring approximately 5 x 3.4 x 5.5 cm, with invasion of the surrounding tissues worrisome for thyroid carcinoma. Partial encasement of the trachea. Broad surface along the esophagus. Small but likely pathologic nodes in the lower neck and superior mediastinum, some with calcification, probably involved by metastatic disease.   Dg Chest Port 1 View 11/29/2017 Tube and catheter positions as described without pneumothorax. There is pulmonary vascular congestion  with interstitial edema and small pleural effusions. Suspect a degree of underlying congestive heart failure. No airspace consolidation.    Lymph node, needle/core biopsy - METASTATIC PAPILLARY THYROID CARCINOMA.   PHYSICAL EXAM General - morbid obesity,middle aged Caucasian male, intubated on low dose sedation.  Ophthalmologic - fundi not visualized due to noncooperation.  Cardiovascular - irregularly irregular heart rate and rhythm, afib RVR.  Neuro - intubated on low dose sedation, eyes open on voice but not blinking to visual threat bilaterally, PERRL, eye midposition, right eye right gaze not complete, able to follow commands on horizontal movement. Able to close and open eyes on commands. Weak  corneal bilaterally, R>L. Weak gag. Facial symmetry not able to test due to ET tube, not able to move tongue or mouth on command. No movement on all extremities even with pain stimulation, DTR diminished and no babinski. Sensation, coordination and gait not tested.   ASSESSMENT/PLAN [name] is a 62 y.o. male with PMH of DM, hemoptysis, OSA, HTN, HLD, CAD, afib off Eliquis due to hemoptysis presenting with slurred speech, right sided weakness. Was intubated for hemoptysis prevention while giving tPA. Symptoms worsened and imaging confirmed BA occlusion. S/p IR with revascularization of BA as well as left VA s/p stenting.  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   MRI head - large acute/early subacute infarction within the central pons involving both sides, greater on the right.  CTA H&N - severe vascular athero including BA stenosis, b/l VA stenosis, right ICA proximal 90% stenosis, b/l cavernous ICA stenosis R>L.  Repeat CTA H/N - new distal BA occlusion  DSA - revascularization of the distal basilar artery to 90 % patency , and of 30 to 40 % of the LT VBJ with stent placement.  CT head 11/24/17 showed normal evolution of brainstem infarct, no acute change  2D Echo - EF 55-60%  LDL - 23  HgbA1c - 9.1  VTE prophylaxis - heparin subq  Eliquis (recently stopped) and plavix prior to admission, now on IV heparin drip.   Ongoing aggressive stroke risk factor management  Therapy recommendations:  pending  Disposition:  Pending  METASTATIC PAPILLARY THYROID CARCINOMA  CTA neck -  right large thyroid mass compressing on trachea.   Thyroid mass larger than CT chest in 08/2016  Evidence of tracheal erosion on bronch at this admission  CT neck with contrast showed thyroid mass concerning for carcinoma, encasement of trachea.    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   oncology consult, Dr Learta Codding on board  Respiratory failure  CCM on board  Current intubated on sedation  no cuff leak  Plan for trach and PEG next week  PAF  Currently in afib RVR with respiratory distress  On eliquis in the past but off due to hemoptysis  On heparin IV now  Continue brilinta due to VA stent  Coreg bid for rate control  Cerebral vascular athero  CTA head and neck showed right ICA proximal 90% stenosis, left ICA proximal athero, b/l cavernous ICA severe athro R>L, BA severe stenosis, b/l V4 severe athero with right V4  occlusion  S/p left V4 stent and BA revascularization  On brilinta  Hemoptysis, improved  Since 08/2016 as per family  Not sure about etiology  TB ruled out in the past  Possible tracheal erosion from right thyroid mass   CT chest showed scattered long nodules, RUE suspected PNA  CT neck showed thyroid mass concerning for carcinoma, encasement of trachea.   CCM on board  Fever, improving  Tmax 102.4->102.5->100.0->98.3->100.8  CXR - progression of bibasilar consolidation -> Mild vascular congestion is noted without focal confluent infiltrate  Off antibiotics  Hypertension  Stable on the low side  On coreg bid  Also on lasix Q8h for fluid overload . Long term BP goal normotensive.  Hyperlipidemia  Lipid lowering medication PTA: Crestor 10 mg daily  LDL 23, goal < 70  On Crestor  Continue statin at discharge  Diabetes  HgbA1c 9.1, goal < 7.0  Uncontrolled  On lantus and novolog  Continued hyperglycemia  CCM on board   CBG monitoring  Other Stroke Risk Factors  Advanced  age  Former cigarette smoker - quit, 40 pack year history  Morbid obesity, Body mass index is 44.54 kg/m.    Hx stroke/TIA  Coronary artery disease   PAD with claudication  OSA on CPAP  Other Active Problems  Leukocytosis, resolved WBC 9.3->11.4->9.3  This patient is critically ill and at significant risk of neurological worsening, death and care requires constant monitoring of vital signs, hemodynamics,respiratory and cardiac monitoring, extensive review of multiple databases, frequent neurological assessment, discussion with family, other specialists and medical decision making of high complexity. I spent 40 minutes of neurocritical care time  in the care of  this patient.  Rosalin Hawking, MD PhD Stroke Neurology 12/04/2017 1:25 PM  To contact Stroke Continuity provider, please refer to [email or web address] After hours, contact General Neurology

## 2017-12-04 NOTE — Progress Notes (Signed)
Palliative Medicine RN Note: Rec'd request from PMT NP asking for help finding communication board. I spoke with SLP Elmyra Ricks. She recommended placement of SLP order; they work with locked in patients and have several ways to establish communication.   Obtained order from Romona Curls, NP.  Marjie Skiff Rebekah Zackery, RN, BSN, [facility] Palliative Medicine Team 12/04/2017 11:12 AM Office [phone]

## 2017-12-04 NOTE — Progress Notes (Signed)
eLink Physician-Brief Progress Note Patient Name: [name] [name] DOB: 25-Oct-1955 MRN: [id number]   Date of Service  12/04/2017  HPI/Events of Note  Foley catheter leaking and caked with debris.   eICU Interventions  Replace Foley catheter.      Intervention Category Major Interventions: Other:  Lysle Dingwall 12/04/2017, 8:22 PM

## 2017-12-04 NOTE — Progress Notes (Signed)
ANTICOAGULATION CONSULT NOTE   Pharmacy Consult for Heparin Indication: atrial fibrillation  No Known Allergies  Patient Measurements: Height: _0  (177.8 cm) Weight: (!) 310 lb 6.5 oz (140.8 kg) IBW/kg (Calculated) : 73 Heparin Dosing Weight: 109.5  Vital Signs: Temp: 98.4 F (36.9 C) (07/13 0800) Temp Source: Axillary (07/13 0800) BP: 114/74 (07/13 1220) Pulse Rate: 98 (07/13 1220)  Labs: Recent Labs    12/02/17 0200 12/02/17 0219  12/02/17 1800 12/03/17 0200 12/03/17 0558 12/04/17 0500  HGB 10.3*  --   --   --   --  10.8* 10.5*  HCT 32.8*  --   --   --   --  34.6* 33.5*  PLT 319  --   --   --   --  388 363  HEPARINUNFRC <0.10*  --    < > 0.38 0.43  --  0.53  CREATININE  --  0.58*  --   --   --  0.65 0.66   < > = values in this interval not displayed.   Estimated Creatinine Clearance: 137.3 mL/min (by C-G formula based on SCr of 0.66 mg/dL).  Medical History: Past Medical History:  Diagnosis Date  . Collagen vascular disease ([place])   . Coronary artery disease   . GERD (gastroesophageal reflux disease)   . High cholesterol   . Hypertension   . OSA (obstructive sleep apnea)    "dx'd years ago; never have worn mask; mask never ordered" (04/01/2017)  . Pneumonia 2011  . Type II diabetes mellitus ([place]) 07/2016   Medications:  Scheduled:  . carvedilol  6.25 mg Oral BID WC  . chlorhexidine gluconate (MEDLINE KIT)  15 mL Mouth Rinse BID  . Chlorhexidine Gluconate Cloth  6 each Topical Q0600  . clonazepam  1 mg Oral TID  . docusate  100 mg Oral BID  . feeding supplement (PRO-STAT SUGAR FREE 64)  30 mL Per Tube TID  . furosemide  40 mg Intravenous Q8H  . insulin aspart  0-20 Units Subcutaneous Q4H  . insulin aspart  8 Units Subcutaneous Q4H  . insulin glargine  45 Units Subcutaneous QHS  . ipratropium-albuterol  3 mL Nebulization TID  . mouth rinse  15 mL Mouth Rinse 10 times per day  . pantoprazole sodium  40 mg Per Tube Daily  . potassium chloride  40 mEq  Per Tube TID  . rosuvastatin  20 mg Per Tube q1800  . sodium chloride flush  10-40 mL Intracatheter Q12H  . ticagrelor  90 mg Oral BID   Or  . ticagrelor  90 mg Per Tube BID   Assessment: 62 YO male admitted on 6/27 for stroke and was found to have a mass on his thyroid causing hemoptysis. Biopsy results have identified metastatic papillary thyroid carcinoma. Patient was on Eliquis PTA for atrial fibrillation but last dose was 3 weeks PTA. Patient is currently on Aspirin and Brilinta. Pharmacy has been consulted to dose heparin for further anticoagulation. CBC stable. No s/sx of bleeding per RN.  Heparin level up slightly to 0.53 which is essentially at goal. If level remains elevated tomorrow, will plan to decrease heparin dose   Goal of Therapy: Heparin level 0.3-0.5 units/ml Monitor platelets by anticoagulation protocol: Yes   Plan:  - Continue heparin to 3100 units/hr (31 ml/hr) - Will continue to monitor for any signs/symptoms of bleeding -Daily heparin level and CBC while on heparin  -F/u with goals of care and anticoagulation plan  Thank you for allowing  pharmacy to be a part of this patient's care.  Albertina Parr, PharmD., BCPS Clinical Pharmacist Clinical phone for 12/04/17 until 3:30pm: [phone] If after 3:30pm, please refer to [facility] for unit-specific pharmacist

## 2017-12-05 ENCOUNTER — Inpatient Hospital Stay (HOSPITAL_COMMUNITY): Payer: 59

## 2017-12-05 DIAGNOSIS — J810 Acute pulmonary edema: Secondary | ICD-10-CM

## 2017-12-05 DIAGNOSIS — J9601 Acute respiratory failure with hypoxia: Secondary | ICD-10-CM

## 2017-12-05 DIAGNOSIS — I639 Cerebral infarction, unspecified: Secondary | ICD-10-CM

## 2017-12-05 DIAGNOSIS — Z515 Encounter for palliative care: Secondary | ICD-10-CM

## 2017-12-05 DIAGNOSIS — R509 Fever, unspecified: Secondary | ICD-10-CM

## 2017-12-05 DIAGNOSIS — R739 Hyperglycemia, unspecified: Secondary | ICD-10-CM

## 2017-12-05 DIAGNOSIS — E079 Disorder of thyroid, unspecified: Secondary | ICD-10-CM

## 2017-12-05 DIAGNOSIS — Z7189 Other specified counseling: Secondary | ICD-10-CM

## 2017-12-05 DIAGNOSIS — R042 Hemoptysis: Secondary | ICD-10-CM

## 2017-12-05 LAB — BASIC METABOLIC PANEL
ANION GAP: 11 (ref 5–15)
BUN: 34 mg/dL — ABNORMAL HIGH (ref 8–23)
CALCIUM: 10.5 mg/dL — AB (ref 8.9–10.3)
CHLORIDE: 88 mmol/L — AB (ref 98–111)
CO2: 36 mmol/L — ABNORMAL HIGH (ref 22–32)
Creatinine, Ser: 0.97 mg/dL (ref 0.61–1.24)
GFR calc non Af Amer: >60 mL/min (ref >60)
Glucose, Bld: 234 mg/dL — ABNORMAL HIGH (ref 70–99)
Potassium: 4 mmol/L (ref 3.5–5.1)
SODIUM: 135 mmol/L (ref 135–145)

## 2017-12-05 LAB — CBC
HCT: 35.8 % — ABNORMAL LOW (ref 39.0–52.0)
Hemoglobin: 11 g/dL — ABNORMAL LOW (ref 13.0–17.0)
MCH: 27 pg (ref 26.0–34.0)
MCHC: 30.7 g/dL (ref 30.0–36.0)
MCV: 87.7 fL (ref 78.0–100.0)
Platelets: 430 10*3/uL — ABNORMAL HIGH (ref 150–400)
RBC: 4.08 MIL/uL — ABNORMAL LOW (ref 4.22–5.81)
RDW: 13.7 % (ref 11.5–15.5)
WBC: 10 10*3/uL (ref 4.0–10.5)

## 2017-12-05 LAB — POCT I-STAT 3, ART BLOOD GAS (G3+)
ACID-BASE EXCESS: 14 mmol/L — AB (ref 0.0–2.0)
BICARBONATE: 40.1 mmol/L — AB (ref 20.0–28.0)
O2 Saturation: 98 %
TCO2: 42 mmol/L — AB (ref 22–32)
pCO2 arterial: 58 mmHg — ABNORMAL HIGH (ref 32.0–48.0)
pH, Arterial: 7.448 (ref 7.350–7.450)
pO2, Arterial: 98 mmHg (ref 83.0–108.0)

## 2017-12-05 LAB — HEPARIN LEVEL (UNFRACTIONATED)
Heparin Unfractionated: 0.59 IU/mL (ref 0.30–0.70)
Heparin Unfractionated: 0.73 IU/mL — ABNORMAL HIGH (ref 0.30–0.70)

## 2017-12-05 LAB — GLUCOSE, CAPILLARY
GLUCOSE-CAPILLARY: 313 mg/dL — AB (ref 70–99)
Glucose-Capillary: 223 mg/dL — ABNORMAL HIGH (ref 70–99)
Glucose-Capillary: 203 mg/dL — ABNORMAL HIGH (ref 70–99)
Glucose-Capillary: 239 mg/dL — ABNORMAL HIGH (ref 70–99)
Glucose-Capillary: 253 mg/dL — ABNORMAL HIGH (ref 70–99)
Glucose-Capillary: 284 mg/dL — ABNORMAL HIGH (ref 70–99)

## 2017-12-05 LAB — MAGNESIUM: MAGNESIUM: 2 mg/dL (ref 1.7–2.4)

## 2017-12-05 LAB — PHOSPHORUS: PHOSPHORUS: 5 mg/dL — AB (ref 2.5–4.6)

## 2017-12-05 MED ORDER — INSULIN ASPART 100 UNIT/ML ~~LOC~~ SOLN
10.0000 [IU] | SUBCUTANEOUS | Status: DC
  Administered 2017-12-05 (×3): 10 [IU] via SUBCUTANEOUS

## 2017-12-05 MED ORDER — INSULIN GLARGINE 100 UNIT/ML ~~LOC~~ SOLN
25.0000 [IU] | Freq: Two times a day (BID) | SUBCUTANEOUS | Status: DC
  Administered 2017-12-05 (×2): 25 [IU] via SUBCUTANEOUS
  Filled 2017-12-05 (×3): qty 0.25

## 2017-12-05 MED ORDER — ACETAZOLAMIDE SODIUM 500 MG IJ SOLR
250.0000 mg | Freq: Four times a day (QID) | INTRAMUSCULAR | Status: AC
  Administered 2017-12-05 (×3): 250 mg via INTRAVENOUS
  Filled 2017-12-05 (×3): qty 250

## 2017-12-05 MED ORDER — FUROSEMIDE 10 MG/ML IJ SOLN
40.0000 mg | Freq: Four times a day (QID) | INTRAMUSCULAR | Status: AC
  Administered 2017-12-05 (×3): 40 mg via INTRAVENOUS
  Filled 2017-12-05 (×3): qty 4

## 2017-12-05 MED ORDER — POTASSIUM CHLORIDE 20 MEQ/15ML (10%) PO SOLN
40.0000 meq | Freq: Three times a day (TID) | ORAL | Status: AC
  Administered 2017-12-05 (×2): 40 meq
  Filled 2017-12-05 (×2): qty 30

## 2017-12-05 MED ORDER — SODIUM CHLORIDE 0.9 % IV SOLN
INTRAVENOUS | Status: DC
  Administered 2017-12-06: 15.1 [IU]/h via INTRAVENOUS
  Administered 2017-12-06: 2.2 [IU]/h via INTRAVENOUS
  Administered 2017-12-06: 10.5 [IU]/h via INTRAVENOUS
  Administered 2017-12-07: 13.9 [IU]/h via INTRAVENOUS
  Administered 2017-12-07: 13.6 [IU]/h via INTRAVENOUS
  Administered 2017-12-07: 8.4 [IU]/h via INTRAVENOUS
  Administered 2017-12-08 (×2): 10.2 [IU]/h via INTRAVENOUS
  Administered 2017-12-08: 12.7 [IU]/h via INTRAVENOUS
  Administered 2017-12-09: 13.6 [IU]/h via INTRAVENOUS
  Administered 2017-12-09: 6.5 [IU]/h via INTRAVENOUS
  Administered 2017-12-10: 5.8 [IU]/h via INTRAVENOUS
  Administered 2017-12-10: 10.6 [IU]/h via INTRAVENOUS
  Administered 2017-12-12: 0.5 [IU]/h via INTRAVENOUS
  Filled 2017-12-05 (×13): qty 1

## 2017-12-05 NOTE — Progress Notes (Signed)
eLink Physician-Brief Progress Note Patient Name: [name] [name] DOB: November 13, 1955 MRN: [id number]   Date of Service  12/05/2017  HPI/Events of Note  Hyperglycemia - Blood glucose = 313. Patient is already on Lantus BID, resistant Novolog SSI and extra 10 units of Novolog Q 4 hours.   eICU Interventions  Will order: 1. D/C current insulin orders.  2. Novolog insulin IV infusion per glucose stabilizer protocol.      Intervention Category Major Interventions: Hyperglycemia - active titration of insulin therapy  Lysle Dingwall 12/05/2017, 11:51 PM

## 2017-12-05 NOTE — Progress Notes (Addendum)
[facility] for Heparin Indication: atrial fibrillation  No Known Allergies  Patient Measurements: Height: 5' 10"  (177.8 cm) Weight: 289 lb 14.5 oz (131.5 kg) IBW/kg (Calculated) : 73 Heparin Dosing Weight: 109.5  Vital Signs: Temp: 100.2 F (37.9 C) (07/14 0756) Temp Source: Axillary (07/14 0756) BP: 133/76 (07/14 1000) Pulse Rate: 106 (07/14 1000)  Labs: Recent Labs    12/03/17 0200  12/03/17 0558 12/04/17 0500 12/05/17 0500  HGB  --    < > 10.8* 10.5* 11.0*  HCT  --   --  34.6* 33.5* 35.8*  PLT  --   --  388 363 430*  HEPARINUNFRC 0.43  --   --  0.53 0.59  CREATININE  --   --  0.65 0.66 0.97   < > = values in this interval not displayed.   Estimated Creatinine Clearance: 109 mL/min (by C-G formula based on SCr of 0.97 mg/dL).  Medical History: Past Medical History:  Diagnosis Date  . Collagen vascular disease ([place])   . Coronary artery disease   . GERD (gastroesophageal reflux disease)   . High cholesterol   . Hypertension   . OSA (obstructive sleep apnea)    "dx'd years ago; never have worn mask; mask never ordered" (04/01/2017)  . Pneumonia 2011  . Type II diabetes mellitus ([place]) 07/2016   Medications:  Scheduled:  . acetaZOLAMIDE  250 mg Intravenous Q6H  . carvedilol  6.25 mg Oral BID WC  . chlorhexidine gluconate (MEDLINE KIT)  15 mL Mouth Rinse BID  . Chlorhexidine Gluconate Cloth  6 each Topical Q0600  . clonazepam  1 mg Oral TID  . docusate  100 mg Oral BID  . feeding supplement (PRO-STAT SUGAR FREE 64)  30 mL Per Tube TID  . furosemide  40 mg Intravenous Q6H  . insulin aspart  0-20 Units Subcutaneous Q4H  . insulin aspart  10 Units Subcutaneous Q4H  . insulin glargine  25 Units Subcutaneous BID  . ipratropium-albuterol  3 mL Nebulization TID  . mouth rinse  15 mL Mouth Rinse 10 times per day  . pantoprazole sodium  40 mg Per Tube Daily  . potassium chloride  40 mEq Per Tube TID  . rosuvastatin  20 mg Per  Tube q1800  . sodium chloride flush  10-40 mL Intracatheter Q12H  . ticagrelor  90 mg Oral BID   Or  . ticagrelor  90 mg Per Tube BID   Assessment: 62 YO male admitted on 6/27 for stroke and was found to have a mass on his thyroid causing hemoptysis. Biopsy results have identified metastatic papillary thyroid carcinoma. Patient was on Eliquis PTA for atrial fibrillation but last dose was 3 weeks PTA. Patient is currently on Aspirin and Brilinta. Pharmacy has been consulted to dose heparin for further anticoagulation.   Heparin level remains elevated despite rate decreases. Per RN, no s/s of bleeding.   Goal of Therapy: Heparin level 0.3-0.5 units/ml Monitor platelets by anticoagulation protocol: Yes   Plan:  - Decrease heparin to 2700 units/hr - Will continue to monitor for any signs/symptoms of bleeding - F/u 6 hr HL -Daily heparin level and CBC while on heparin  -F/u with goals of care and long-term anticoagulation plan  Thanks for allowing pharmacy to be a part of this patient's care.  Excell Seltzer, PharmD Clinical Pharmacist

## 2017-12-05 NOTE — Progress Notes (Signed)
PULMONARY / CRITICAL CARE MEDICINE   Name: [name] [name] MRN: [id number] DOB: 07-02-1955    ADMISSION DATE:  11/18/2017 CONSULTATION DATE: 11/18/2017  REFERRING MD: Emergency department  CHIEF COMPLAINT: Stroke  HISTORY OF PRESENT ILLNESS:    [name] [name]  is a 62 year old man with atrial fibrillation, formally on anticoagulation was stopped due to recent hemoptysis.  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  He presented with an acute stroke 6/27, was intubated and potential tracheal erosion site covered with cuff balloon to facilitate TPA.  Unfortunately during the hospitalization is also experienced a second stroke and underwent mechanical thrombectomy, stent placement 7/1.  SUBJECTIVE:  No events overnight, no new complaints  VITAL SIGNS: BP 95/70 (BP Location: Left Arm)   Pulse (!) 102   Temp 100.2 F (37.9 C) (Axillary)   Resp 19   Ht 5\' 10"  (1.778 m)   Wt 289 lb 14.5 oz (131.5 kg)   SpO2 98%   BMI 41.60 kg/m   HEMODYNAMICS:    VENTILATOR SETTINGS: Vent Mode: PCV FiO2 (%):  [40 %] 40 % Set Rate:  [20 bmp] 20 bmp PEEP:  [5 cmH20-8 cmH20] 8 cmH20 Pressure Support:  [10 cmH20] 10 cmH20 Plateau Pressure:  [18 cmH20-20 cmH20] 19 cmH20  INTAKE / OUTPUT: I/O last 3 completed shifts: In: 5228.4 [I.V.:2588.5; NG/GT:2590; IV Piggyback:50] Out: 8925 [Urine:8925]  PHYSICAL EXAMINATION: General: Acute on chronically ell appearing male, NAD on vent, sedate HEENT: [place]/AT, PERRL, EOM-I and MMM Neuro: Sedate this AM, not moving any ext to command CV: IRIR, Nl S1/S2 and -M/R/G PULM: Coarse BS diffusely GI: Obese, soft, NT, ND and +BS Extremities: 2+ lower extremity edema Skin: No rash  Recent Labs  Lab 12/03/17 0558 12/04/17 0500 12/05/17 0500  NA 135 134* 135  K 3.2* 3.6 4.0  CL 90* 90* 88*  CO2 33* 35* 36*  BUN 26* 27* 34*  CREATININE 0.65 0.66 0.97  GLUCOSE 243* 260* 234*   Electrolytes Recent Labs  Lab  12/02/17 0219 12/03/17 0558 12/04/17 0500 12/05/17 0500  CALCIUM 9.7 9.8 10.2 10.5*  MG  --  1.9 1.8 2.0  PHOS 4.2  --   --  5.0*   CBC Recent Labs  Lab 12/03/17 0558 12/04/17 0500 12/05/17 0500  WBC 11.4* 9.3 10.0  HGB 10.8* 10.5* 11.0*  HCT 34.6* 33.5* 35.8*  PLT 388 363 430*   Coag's No results for input(s): APTT, INR in the last 168 hours.  Sepsis Markers No results for input(s): LATICACIDVEN, PROCALCITON, O2SATVEN in the last 168 hours.  ABG Recent Labs  Lab 11/29/17 0438 12/05/17 0502  PHART 7.445 7.448  PCO2ART 39.1 58.0*  PO2ART 71.0* 98.0   Liver Enzymes Recent Labs  Lab 12/02/17 0219  ALBUMIN 2.3*   Cardiac Enzymes No results for input(s): TROPONINI, PROBNP in the last 168 hours.  Glucose Recent Labs  Lab 12/04/17 1208 12/04/17 1655 12/04/17 1951 12/04/17 2317 12/05/17 0342 12/05/17 0724  GLUCAP 247* 273* 264* 228* 223* 203*   Imaging Dg Chest Port 1 View  Result Date: 12/05/2017 CLINICAL DATA:  Endotracheal tube EXAM: PORTABLE CHEST 1 VIEW COMPARISON:  12/01/2017 FINDINGS: Endotracheal tube in good position. Right arm PICC tip in the lower SVC. NG tube in the stomach with the tip not visualized. Improved aeration in the right lung base. Small right effusion remains. Improvement aeration left lower lobe with residual left lower lobe airspace disease and small left effusion. IMPRESSION: Endotracheal tube in good position. Improved aeration  in the lung bases. Electronically Signed   By: Franchot Gallo M.D.   On: 12/05/2017 08:45   CULTURES: 6/27 MRSA PCR >> neg 11/21/2017 sputum>> normal flora  ANTIBIOTICS: 11/18/2017 Unasyn>> 7/2 7/2 Cefotan >> 7/6  SIGNIFICANT EVENTS: 11/18/2017 intubated Right cervical lymph node biopsy 7/8 >> metastatic papillary thyroid carcinoma  LINES/TUBES: 11/18/2017 endotracheal tube>> 6/27 OGT >> 6/27 Foley >>  DISCUSSION:  ASSESSMENT / PLAN:  PULMONARY A: Vent dependent respiratory failure Possible  airway compromise due to paratracheal/thyroid mass Pulmonary nodules, question metastatic Right upper lobe infiltrate at presentation, presumed pneumonia, culture negative COPD PS at tolerated Needs trach per family discussion, they disagree but they are complying with patient's wishes VAP prevention order set Tolerating diuresis, continue Lasix 40 mg every 8 hours for now Tracheal deviation and possible erosion due to his paratracheal mass.  This is now been shown to be metastatic papillary thyroid carcinoma.  ENT to address tracheostomy  CARDIOVASCULAR A:  History of  Paroxysmal atrial fibrillation, currently off anticoagulation for recent hemoptysis of 3 weeks duration     Coronary artery disease.   Peripheral vascular disease Metoprolol as needed Continue Crestor, carvedilol Continue heparin drip Lasix at same dose as outlined above Continue Brilinta, aspirin  RENAL  A:   No acute renal issues - remains positive 9.1L  P:   Continue Lasix as described above Lasix 40 mg IV q6 x3 doses Diamox 250 mg IV q6 x3 doses to address contraction alkalosis Replace electrolytes as indicated Follow BMP, urine output  Thyroid Mass with possible associated airway compromise Appreciate Dr. Pascal Lux assistance with interventional radiology.  Metastatic papillary thyroid carcinoma.  I do not believe there is significant benefit to be conferred with tracheostomy.  This will only allow continue mechanical ventilation in a very debilitated state due to his strokes until he succumbs to his metastatic cancer.  GASTROINTESTINAL A:   History of GERD Nutrition P:   PPI as ordered Tube feeding at goal Dulcolax and colace   HEMATOLOGIC A:   Chronic anticoagulation for atrial fibrillation Off anticoagulation for 3 weeks due to hemoptysis Follow CBC on aspirin, Brilinta, heparin drip  INFECTIOUS A:   Observed aspiration, right upper lobe pneumonia P:   Following off  antibiotics  ENDOCRINE Diabetes mellitus - ongoing hyperglycemia  P:   CBG q 4 SSI - resistant Lantus 45 units HS Increased novolog scheduled q 4 from 6 to 8 units on 7/10  PAD Patient had a cool right foot which resolved with removing his right femoral sheath.    NEUROLOGIC A:   Multiple strokes, initially received TPA and then underwent basilar artery thrombectomy and stent placement Appreciate neurology management.  Poor prognosis for neurological recovery Brilinta, aspirin  FAMILY: Family updated bedside  The patient is critically ill with multiple organ systems failure and requires high complexity decision making for assessment and support, frequent evaluation and titration of therapies, application of advanced monitoring technologies and extensive interpretation of multiple databases.   Critical Care Time devoted to patient care services described in this note is  36  Minutes. This time reflects time of care of this signee Dr [name]. This critical care time does not reflect procedure time, or teaching time or supervisory time of PA/NP/Med student/Med Resident etc but could involve care discussion time.  Rush Farmer, M.D. [facility] Pulmonary/Critical Care Medicine. Pager: [phone]. After hours pager: [phone].  12/05/2017, 8:56 AM

## 2017-12-05 NOTE — Progress Notes (Signed)
STROKE TEAM PROGRESS NOTE    SUBJECTIVE (INTERVAL HISTORY) His RN is at the bedside.  Pt remains intubated without movement. No acute event overnight. Intermittent fentanyl bolus for vent desynchronization.    OBJECTIVE Temp:  [98.4 F (36.9 C)-100.5 F (38.1 C)] 100.2 F (37.9 C) (07/14 0756) Pulse Rate:  [31-107] 96 (07/14 0600) Cardiac Rhythm: Atrial fibrillation (07/14 0000) Resp:  [20-33] 22 (07/14 0600) BP: (100-146)/(59-81) 100/68 (07/14 0600) SpO2:  [94 %-100 %] 98 % (07/14 0600) FiO2 (%):  [40 %] 40 % (07/14 0419) Weight:  [289 lb 14.5 oz (131.5 kg)] 289 lb 14.5 oz (131.5 kg) (07/14 0500)  CBC:  Recent Labs  Lab 11/29/17 0500  12/04/17 0500 12/05/17 0500  WBC 9.3   < > 9.3 10.0  NEUTROABS 6.8  --   --   --   HGB 9.4*   < > 10.5* 11.0*  HCT 30.6*   < > 33.5* 35.8*  MCV 90.3   < > 87.9 87.7  PLT 229   < > 363 430*   < > = values in this interval not displayed.    Basic Metabolic Panel:  Recent Labs  Lab 12/02/17 0219  12/04/17 0500 12/05/17 0500  NA 136   < > 134* 135  K 3.4*   < > 3.6 4.0  CL 94*   < > 90* 88*  CO2 33*   < > 35* 36*  GLUCOSE 147*   < > 260* 234*  BUN 22   < > 27* 34*  CREATININE 0.58*   < > 0.66 0.97  CALCIUM 9.7   < > 10.2 10.5*  MG  --    < > 1.8 2.0  PHOS 4.2  --   --  5.0*   < > = values in this interval not displayed.    Lipid Panel:     Component Value Date/Time   CHOL 110 11/19/2017 0437   CHOL 119 08/09/2017 1000   TRIG 340 (H) 11/19/2017 0437   HDL 19 (L) 11/19/2017 0437   HDL 29 (L) 08/09/2017 1000   CHOLHDL 5.8 11/19/2017 0437   VLDL 68 (H) 11/19/2017 0437   LDLCALC 23 11/19/2017 0437   LDLCALC 30 08/09/2017 1000   HgbA1c:  Lab Results  Component Value Date   HGBA1C 9.1 (H) 11/19/2017   Urine Drug Screen: No results found for: LABOPIA, COCAINSCRNUR, LABBENZ, AMPHETMU, THCU, LABBARB  Alcohol Level No results found for: [place] I have personally reviewed the radiological images below and agree with the  radiology interpretations.  Ct Angio Head W Or Wo Contrast  Ct Angio Neck W Or Wo Contrast 11/18/2017 Since the study of earlier today, there are only 2 changes. There is new consolidation and volume loss in the right upper lobe. 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 Flow is seen distal to that however, presumably indicating patent posterior communicating arteries.   Ct Angio Head W Or Wo Contrast Ct Angio Neck W Or Wo Contrast 11/18/2017 1. Severe intracranial and extracranial atherosclerotic disease. No emergent large vessel occlusion  2. Severe stenosis right carotid bifurcation due to calcified plaque, estimated 90% diameter stenosis. No significant left carotid stenosis. Severe stenosis cavernous carotid bilaterally.  3. Both vertebral arteries are patent in the neck with severe calcific stenosis V4 segment bilaterally.  4. 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 Small calcified lymph nodes left neck suspicious for metastatic disease  5. Small lung nodules on the  right, possible metastatic disease. Recommend chest CT for further evaluation.   Ct Head Wo Contrast 11/19/2017 1. Expected evolution of brainstem infarct without hemorrhage.  2. No new infarcts.  3. Left vertebral artery and basilar artery stent.  4. Acute on chronic sinus disease may be related to intubation.   Ct Chest W Contrast 11/18/2017 Right upper lobe airspace opacity is noted with air bronchograms concerning for pneumonia or possibly atelectasis. Stable mediastinal adenopathy is noted compared to prior exam. Endotracheal and nasogastric tubes are in grossly good position. Coronary artery calcifications are noted suggesting coronary artery disease. Possible 2.8 cm left thyroid nodule. Thyroid ultrasound is recommended for further evaluation. Aortic Atherosclerosis (ICD10-I70.0).   Mr Brain Wo Contrast 11/19/2017 1. Acute/early subacute infarction within the central pons involving both  sides, greater on the right, measuring up to 2.6 cm, 3.6 cc. No associated hemorrhage or significant mass effect.  2. Additional punctate acute/early subacute infarctions within left lateral cerebellum and right parietal cortex.  3. Minimal chronic microvascular ischemic changes of the brain and mild parenchymal volume loss.   Ct Head Code Stroke Wo Contrast 11/18/2017 1. No acute intracranial abnormality.  Atherosclerotic calcification  2. ASPECTS is 10   Cerebral Angiogram / Stent  11/18/2017 1. 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 Occluded non dominant RT VBJ. 2. S/P endovascular revascularization of the distal basilar artery  To 90 % patency , and of 30 to 40 % of the LT VBJ with stent placement. Dominant  Lt Pcom.  Transthoracic Echocardiogram - Left ventricle: The cavity size was moderately dilated. Wall thickness was increased in a pattern of severe LVH. Systolic function was normal. The estimated ejection fraction was in the range of 55% to 60%. Wall motion was normal; there were no regional wall motion abnormalities. The study is not technically sufficient to allow evaluation of LV diastolic function. - Aortic valve: Trileaflet. Sclerosis without stenosis. There was no regurgitation. Valve area (Vmax): 2.88 cm^2. - Aorta: Aortic root ML diameter: 41.95 mm (ED). - Aortic root: The aortic root is mildly dilated. - Mitral valve: Calcified annulus. There was trivial regurgitation. - Left atrium: The atrium was mildly dilated. - Right ventricle: The cavity size was mildly dilated. Mildly reduced systolic function. - Inferior vena cava: The vessel was normal in size. The respirophasic diameter changes were in the normal range (>= 50%), consistent with normal central venous pressure. Impressions:  Compared to a prior study in 02/2017, there have been no significant changes.  Ct Head Wo Contrast 11/24/2017 1. Continued  normal expected interval evolution of brainstem infarct. No evidence for hemorrhagic transformation or other complication.  2. No other new intracranial abnormality.  3. Moderate paranasal sinus disease with bilateral mastoid effusions, likely related intubation.   Dg Chest Port 1 View 11/25/2017 Mild vascular congestion. Tubes and lines as described.   Ct Soft Tissue Neck W Contrast 11/26/2017 Aside from the fact that the patient shows endotracheal intubation and the presence of an orogastric tube, and aside from development of bilateral layering pleural effusions with dependent atelectasis, the findings are unchanged since the previous studies. Irregular mass arising from the inferior left thyroid lobe, measuring approximately 5 x 3.4 x 5.5 cm, with invasion of the surrounding tissues worrisome for thyroid carcinoma. Partial encasement of the trachea. Broad surface along the esophagus. Small but likely pathologic nodes in the lower neck and superior mediastinum, some with calcification, probably involved by  metastatic disease.   Dg Chest Port 1 View 11/29/2017 Tube and catheter positions as described without pneumothorax. There is pulmonary vascular congestion with interstitial edema and small pleural effusions. Suspect a degree of underlying congestive heart failure. No airspace consolidation.    Lymph node, needle/core biopsy - METASTATIC PAPILLARY THYROID CARCINOMA.   PHYSICAL EXAM General - morbid obesity,middle aged Caucasian male, intubated on low dose sedation.  Ophthalmologic - fundi not visualized due to noncooperation.  Cardiovascular - irregularly irregular heart rate and rhythm, afib RVR.  Neuro - intubated on low dose sedation, eyes open on voice but not blinking to visual threat bilaterally, PERRL, eye midposition, right eye right gaze not complete, able to follow commands on horizontal movement. Able to close and open eyes on commands. Weak corneal bilaterally, R>L. Weak gag.  Facial symmetry not able to test due to ET tube, not able to move tongue or mouth on command. No movement on all extremities even with pain stimulation, DTR diminished and no babinski. Sensation, coordination and gait not tested.   ASSESSMENT/PLAN [name] is a 62 y.o. male with PMH of DM, hemoptysis, OSA, HTN, HLD, CAD, afib off Eliquis due to hemoptysis presenting with slurred speech, right sided weakness. Was intubated for hemoptysis prevention while giving tPA. Symptoms worsened and imaging confirmed BA occlusion. S/p IR with revascularization of BA as well as left VA s/p stenting.  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   MRI head - large acute/early subacute infarction within the central pons involving both sides, greater on the right.  CTA H&N - severe vascular athero including BA stenosis, b/l VA stenosis, right ICA proximal 90% stenosis, b/l cavernous ICA stenosis R>L.  Repeat CTA H/N - new distal BA occlusion  DSA - revascularization of the distal basilar artery to 90 % patency , and of 30 to 40 % of the LT VBJ with stent placement.  CT head 11/24/17 showed normal evolution of brainstem infarct, no acute change  2D Echo - EF 55-60%  LDL - 23  HgbA1c - 9.1  VTE prophylaxis - heparin subq  Eliquis (recently stopped) and plavix prior to admission, now on IV heparin drip.   Ongoing aggressive stroke risk factor management  Therapy recommendations:  pending  Disposition:  Pending  Palliative care continues to follow for goal of care  METASTATIC PAPILLARY THYROID CARCINOMA  CTA neck - right  large thyroid mass compressing on trachea.   Thyroid mass larger than CT chest in 08/2016  Evidence of tracheal erosion on bronch at this admission  CT neck with contrast showed thyroid mass concerning for carcinoma, encasement of trachea.    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   oncology Dr Learta Codding on board  Respiratory failure  CCM on board  Current intubated on sedation  Intermittent fentanyl for vent desynchronization  no cuff leak  Plan for trach and PEG next week   PAF  Currently in afib RVR with respiratory distress  On eliquis in the past but off due to hemoptysis  On heparin IV now  Continue brilinta due to VA stent  Coreg bid for rate control  Cerebral vascular athero  CTA head and neck showed right ICA proximal 90% stenosis, left ICA proximal athero, b/l cavernous ICA severe athro R>L, BA severe stenosis, b/l V4 severe athero with right V4 occlusion  S/p left V4 stent and BA revascularization  On brilinta and heparin IV  Hemoptysis, improved  Since 08/2016 as per family  Not sure about etiology  TB ruled out in the past  Likely tracheal erosion from right thyroid mass   CT neck showed thyroid mass concerning for carcinoma, encasement of trachea.   CCM on board  Consider tertiary center transfer for mass debulking or tracheal resection  Fever, improving  Tmax 102.4->102.5->100.0->98.3->100.8->100.2  Off antibiotics  12/05/17 CXR - Improved aeration in the lung bases.  Hypertension  Stable on the low side  On coreg bid  Also on lasix Q8h for fluid overload . Long term BP goal normotensive.  Hyperlipidemia  Lipid lowering medication PTA: Crestor 10 mg daily  LDL 23, goal < 70  On Crestor  Continue statin at discharge  Diabetes  HgbA1c 9.1, goal < 7.0  Uncontrolled  On lantus, increase to 25U bid  On novolog increase to 10U Q4h  Continued hyperglycemia  CCM on board   CBG  monitoring  Other Stroke Risk Factors  Advanced age  Former cigarette smoker - quit, 40 pack year history  Morbid obesity, Body mass index is 41.6 kg/m.    Hx stroke/TIA  Coronary artery disease   PAD with claudication  OSA on CPAP  Other Active Problems  Leukocytosis, resolved WBC 9.3->11.4->9.3->10.0  Palliative care on board  This patient is critically ill and at significant risk of neurological worsening, death and care requires constant monitoring of vital signs, hemodynamics,respiratory and cardiac monitoring, extensive review of multiple databases, frequent neurological assessment, discussion with family, other specialists and medical decision making of high complexity. I spent 35 minutes of neurocritical care time  in the care of  this patient.  Rosalin Hawking, MD PhD Stroke Neurology 12/05/2017 10:29 AM   To contact Stroke Continuity provider, please refer to [email or web address] After hours, contact General Neurology

## 2017-12-05 NOTE — Evaluation (Signed)
Speech Language Pathology Evaluation Patient Details Name: [name] [name] MRN: [id number] DOB: 1955-10-03 Today's Date: 12/05/2017 Time: 1440-1500 SLP Time Calculation (min) (ACUTE ONLY): 20 min  Problem List:  Patient Active Problem List   Diagnosis Date Noted  . Fever   . Hyperglycemia   . Encounter for orogastric (OG) tube placement   . Palliative care by specialist   . Thyroid mass   . 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 Acute respiratory failure ([place])   . Claudication in peripheral vascular disease ([place]) 04/05/2017  . Hyperlipidemia LDL goal <70   . Diabetes mellitus due to underlying condition with unspecified complications ([place]) 67/67/2094  . PAF (paroxysmal atrial fibrillation) ([place]) 02/22/2017  . History of smoking 02/22/2017  . Upper airway cough syndrome 01/15/2017  . CAD (coronary artery disease) 09/16/2016  . Essential hypertension 09/11/2016  . Weight loss   . Night sweats   . OSA (obstructive sleep apnea)   . Morbid obesity due to excess calories ([place])   . Lung nodules   . Hemoptysis 08/23/2016   Past Medical History:  Past Medical History:  Diagnosis Date  . Collagen vascular disease ([place])   . Coronary artery disease   . GERD (gastroesophageal reflux disease)   . High cholesterol   . Hypertension   . OSA (obstructive sleep apnea)    "dx'd years ago; never have worn mask; mask never ordered" (04/01/2017)  . Pneumonia 2011  . Type II diabetes mellitus ([place]) 07/2016   Past Surgical History:  Past Surgical History:  Procedure Laterality Date  . CORONARY ANGIOPLASTY WITH STENT PLACEMENT  2011; 07/2016   s/p PCI  . IR ANGIO EXTRACRAN SEL COM CAROTID INNOMINATE UNI BILAT MOD SED  11/18/2017  . IR ANGIO VERTEBRAL SEL SUBCLAVIAN INNOMINATE UNI R MOD SED  11/18/2017  . IR INTRA CRAN STENT  11/18/2017  . IR US GUIDE BX ASP/DRAIN  11/29/2017  . LOWER EXTREMITY ANGIOGRAPHY Bilateral 04/01/2017   Procedure: Lower Extremity  Angiography;  Surgeon: Lorretta Harp, MD;  Location: [facility];  Service: Cardiovascular;  Laterality: Bilateral;  . PERIPHERAL VASCULAR INTERVENTION Left 04/01/2017   Procedure: PERIPHERAL VASCULAR INTERVENTION;  Surgeon: Lorretta Harp, MD;  Location: [facility];  Service: Cardiovascular;  Laterality: Left;  common iliac  . RADIOLOGY WITH ANESTHESIA N/A 11/18/2017   Procedure: RADIOLOGY WITH ANESTHESIA;  Surgeon: Luanne Bras, MD;  Location: [facility];  Service: Radiology;  Laterality: N/A;  . VIDEO BRONCHOSCOPY Bilateral 08/25/2016   Procedure: VIDEO BRONCHOSCOPY WITHOUT FLUORO;  Surgeon: Collene Gobble, MD;  Location: [facility];  Service: Cardiopulmonary;  Laterality: Bilateral;   HPI:  [name] [name]  is a 62 year old man with atrial fibrillation, formally on anticoagulation was stopped due to recent hemoptysis.  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  He presented with an acute stroke 6/27, was intubated and potential tracheal erosion site covered with cuff balloon to facilitate TPA.  Unfortunately during the hospitalization is also experienced a second stroke and underwent mechanical thrombectomy, stent placement 7/1. MRI shows Acute/early subacute infarction within the central pons involving both sides, greater on the right, measuring up to 2.6 cm, 3.6 cc. Additional punctate acute/early subacute infarctions within left lateral cerebellum and right parietal cortex. 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 Orally intubated at the time of initial assessment.    Assessment / Plan / Recommendation Clinical Impression  Initiated alternative communication assessment with family/provider education. Introduced  limitations of assessment on acutely ill pt. We briefly discussed the concerns about pts ability to communicate in the near future if he does have complete CN paralysis even with a tracheostomy including probable  anarthria and aphonia. Pt opened eyes to verbal stimuli, followed command to open his eyes in 80% of trials, but could not sustain eyes open for more than 8 seconds even with max verbal cues. Pt could not produce a definitive "blink." Would not be able to differentiate between a Yes/No response at this time. Hopeful that improved arousal/ability to tolerate decreased sedation could mean more consistent response. Will follow for trials.     SLP Assessment  SLP Recommendation/Assessment: Patient needs continued Speech Lanaguage Pathology Services SLP Visit Diagnosis: Dysarthria and anarthria (R47.1)    Follow Up Recommendations       Frequency and Duration min 3x week  2 weeks      SLP Evaluation Cognition  Overall Cognitive Status: Impaired/Different from baseline Orientation Level: Intubated/Tracheostomy - Unable to assess       Comprehension  Auditory Comprehension Overall Auditory Comprehension: Impaired Yes/No Questions: Not tested Commands: (followed 1-2 commands)    Expression     Oral / Motor  Oral Motor/Sensory Function Overall Oral Motor/Sensory Function: (orally intubated, unable to assess)   GO                   [name], MA CCC-SLP [phone]  Lynann Beaver 12/05/2017, 3:54 PM

## 2017-12-05 NOTE — Progress Notes (Signed)
Daily Progress Note   Patient Name: [name] [name]       Date: 12/05/2017 DOB: Sep 05, 1955  Age: 62 y.o. MRN#: [id number] Attending Physician: Rosalin Hawking, MD Primary Care Physician: Lorella Nimrod, MD Admit Date: 11/18/2017  Reason for Consultation/Follow-up: Establishing goals of care and Psychosocial/spiritual support  Subjective: Patient seen, chart reviewed.  Asked by family members to revisit today.  Family continues to struggle with multiple issues: How much patient is able to understand, the severity of his illness, mostly how to move forward. Patient's brother, [name] [name], feels that patient should be spoken to in more detail regarding not only the severity of his stroke but specifically his new thyroid cancer diagnosis.  Meeting held on 12/03/2017 between critical care medicine, neurology, and myself did not address with patient new thyroid cancer diagnosis.  We focused on attempting to elicit his wishes regarding proceeding with a trach and a PEG in the setting of brainstem infarct, as well as potentially continuing life support.  At that time, patient blinked in response to questions, what appeared to be appropriately, indicating he would want to have a trach. His  ability to process information of this complexity is still an issue that is not clear to me.  SLP therapist, Horris Latino, also came to the bedside and attempted to perform an evaluation regarding his communication ability.  He is currently on fentanyl and low-dose Precedex.  Despite reducing medications, patient still was unable to keep his eyes open longer than 8 seconds.  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  Patient has a  significant other of approximately 10 to 12 years who he requested to be his healthcare power of attorney upon presentation to the emergency room, but this is not in writing.  He has 3 adult children.  I did explain to everyone that decision making capacity likely would fall to them even though he had verbally indicated that he would like his partner to be his healthcare spokesperson.  His family is leaning towards comfort but his daughter, [name] [name], is struggling to come to terms with her father's grim prognosis and life living in what appears to be a locked-in state.  They are all still verbalizing willingness to proceed forward with  a tracheostomy based on their sense that he is understanding what is going on and is able to make his own decisions.  Length of Stay: 17  Current Medications: Scheduled Meds:  . acetaZOLAMIDE  250 mg Intravenous Q6H  . carvedilol  6.25 mg Oral BID WC  . chlorhexidine gluconate (MEDLINE KIT)  15 mL Mouth Rinse BID  . Chlorhexidine Gluconate Cloth  6 each Topical Q0600  . clonazepam  1 mg Oral TID  . docusate  100 mg Oral BID  . feeding supplement (PRO-STAT SUGAR FREE 64)  30 mL Per Tube TID  . furosemide  40 mg Intravenous Q6H  . insulin aspart  0-20 Units Subcutaneous Q4H  . insulin aspart  10 Units Subcutaneous Q4H  . insulin glargine  25 Units Subcutaneous BID  . ipratropium-albuterol  3 mL Nebulization TID  . mouth rinse  15 mL Mouth Rinse 10 times per day  . pantoprazole sodium  40 mg Per Tube Daily  . rosuvastatin  20 mg Per Tube q1800  . sodium chloride flush  10-40 mL Intracatheter Q12H  . ticagrelor  90 mg Oral BID   Or  . ticagrelor  90 mg Per Tube BID    Continuous Infusions: . sodium chloride 10 mL/hr at 12/05/17 1600  . dexmedetomidine (PRECEDEX) IV infusion 0.4 mcg/kg/hr (12/05/17 1600)  . feeding supplement (VITAL HIGH PROTEIN) 1,000 mL (12/05/17 1443)  . fentaNYL infusion INTRAVENOUS 75 mcg/hr (12/05/17 1553)  . heparin 3,050 Units/hr  (12/05/17 1600)    PRN Meds: sodium chloride, acetaminophen **OR** acetaminophen (TYLENOL) oral liquid 160 mg/5 mL **OR** acetaminophen, bisacodyl, fentaNYL, fentaNYL (SUBLIMAZE) injection, lidocaine (PF), metoprolol tartrate, midazolam, sodium chloride flush  Physical Exam  Constitutional: He appears well-developed and well-nourished.  Critically ill-appearing older man; seen in ICU, intubated.  HENT:  Head: Normocephalic and atraumatic.  Cardiovascular: Normal rate.  Pulmonary/Chest:  Intubated.  Not making much progress with weaning  Abdominal: Soft.  Neurological:  Opens his eyes to voice Unable to move his tongue Quadriplegia Will blink in response to simple commands  Skin: Skin is warm and dry.  Psychiatric:  On low-dose Precedex No agitation, otherwise unable to test  Nursing note and vitals reviewed.           Vital Signs: BP (!) 167/72 (BP Location: Left Arm)   Pulse 66   Temp (!) 100.5 F (38.1 C) (Axillary)   Resp (!) 25   Ht 5' 10"  (1.778 m)   Wt 131.5 kg (289 lb 14.5 oz)   SpO2 99%   BMI 41.60 kg/m  SpO2: SpO2: 99 % O2 Device: O2 Device: Ventilator O2 Flow Rate:    Intake/output summary:   Intake/Output Summary (Last 24 hours) at 12/05/2017 1622 Last data filed at 12/05/2017 1600 Gross per 24 hour  Intake 3348.51 ml  Output 8925 ml  Net -5576.49 ml   LBM: Last BM Date: 12/02/17 Baseline Weight: Weight: (!) 141.6 kg (312 lb 2.7 oz) Most recent weight: Weight: 131.5 kg (289 lb 14.5 oz)       Palliative Assessment/Data:    Flowsheet Rows     Most Recent Value  Intake Tab  Referral Department  Critical care  Unit at Time of Referral  ICU  Palliative Care Primary Diagnosis  Neurology  Date Notified  12/02/17  Palliative Care Type  New Palliative care  Reason for referral  Clarify Goals of Care  Date of Admission  11/18/17  Date first seen by Palliative Care  12/03/17  #  of days Palliative referral response time  1 Day(s)  # of days IP prior  to Palliative referral  14  Clinical Assessment  Palliative Performance Scale Score  30%  Pain Max last 24 hours  Not able to report  Pain Min Last 24 hours  Not able to report  Dyspnea Max Last 24 Hours  Not able to report  Dyspnea Min Last 24 hours  Not able to report  Nausea Max Last 24 Hours  Not able to report  Nausea Min Last 24 Hours  Not able to report  Anxiety Max Last 24 Hours  Not able to report  Anxiety Min Last 24 Hours  Not able to report  Other Max Last 24 Hours  Not able to report  Psychosocial & Spiritual Assessment  Palliative Care Outcomes  Patient/Family meeting held?  Yes  Who was at the meeting?  SO, 3 children, 2 step sons, DIL, brother  Palliative Care follow-up planned  Yes, Facility      Patient Active Problem List   Diagnosis Date Noted  . Fever   . Hyperglycemia   . Encounter for orogastric (OG) tube placement   . Palliative care by specialist   . Thyroid mass   . 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 Acute respiratory failure ([place])   . Claudication in peripheral vascular disease ([place]) 04/05/2017  . Hyperlipidemia LDL goal <70   . Diabetes mellitus due to underlying condition with unspecified complications ([place]) 58/85/0277  . PAF (paroxysmal atrial fibrillation) ([place]) 02/22/2017  . History of smoking 02/22/2017  . Upper airway cough syndrome 01/15/2017  . CAD (coronary artery disease) 09/16/2016  . Essential hypertension 09/11/2016  . Weight loss   . Night sweats   . OSA (obstructive sleep apnea)   . Morbid obesity due to excess calories ([place])   . Lung nodules   . Hemoptysis 08/23/2016    Palliative Care Assessment & Plan   Patient Profile: 62 y.o.malewith past medical history of HTN, DM2, a-fib ( off AC 2/2 hemoptysis), hemoptysis, HLDadmitted on 6/27/2019with left sided weakness, slurred speech. He was given TPA and referred for a cerebral arteriogram. He underwent endovascular  revascularization of the distal basilar artery on 11/18/2017. He was intubated for airway protection in the setting of hemoptysis. There was evidence of bleeding and erosion at 3 cm below the vocal cords.  His stroke symptoms progressed in the hospital with resultant quadriplegia, a 6th nerve palsy, and 9 nystagmus. 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 An MRI of the brain revealed an acute/subacute infarct in the central pons.  He was taken off of Eliquis prior to hospital admission secondary to hemoptysis.  A CT of the neck on 11/18/2017 revealed a 2.5 x 4 cm left thyroid mass with small calcified left neck lymph node suspicious for metastatic disease. A CT of the chest on 11/18/2017 revealed right upper lobe airspace disease, mediastinal adenopathy, and a 2.8 cm left thyroid nodule.  He was referred for an ultrasound-guided biopsy of a right cervical lymph node on 11/29/2017. The pathology revealed metastatic papillary thyroid carcinoma.  He remains intubated. He is not moving, but is able to blink his eyes to command.  Consult orderd for [place]. Family mtg 12/03/17 as well as conversation with pt, plan is to proceed at this point with trach/PEG.  Family is requesting an additional meeting with Dr. Hillery Aldo to inquire about patient's new thyroid cancer diagnosis    Recommendations/Plan:  At this  point family wishes to continue with plan for trach and PEG as early as tomorrow  Requesting an additional meeting with Dr. Benay Spice to obtain more information regarding new thyroid cancer diagnosis.  Palliative medicine team to assist with setting this meeting up  SLP with plans to revisit patient on 12/06/2017 in a.m. when perhaps he might be more alert to more fully assessed his communication ability  Goals of Care and Additional Recommendations:  Limitations on Scope of Treatment: Full Scope Treatment  Code Status:    Code Status Orders  (From admission, onward)        Start     Ordered    11/18/17 1826  Full code  Continuous     11/18/17 1826    Code Status History    Date Active Date Inactive Code Status Order ID Comments User Context   11/18/2017 1101 11/18/2017 1826 Full Code [id number]  Greta Doom, MD ED   04/01/2017 1318 04/02/2017 1507 Full Code [id number]  Lorretta Harp, MD Inpatient   08/23/2016 2015 08/26/2016 1949 Full Code [id number]  Milagros Loll, MD ED       Prognosis:   Unable to determine  Discharge Planning:  To Be Determined  Care plan was discussed with Dr Erlinda Hong  Thank you for allowing the Palliative Medicine Team to assist in the care of this patient.   Time In: 1400 Time Out: 1500 Total Time 60 min Prolonged Time Billed  no       Greater than 50%  of this time was spent counseling and coordinating care related to the above assessment and plan.  Dory Horn, NP  Please contact Palliative Medicine Team phone at [phone] for questions and concerns.

## 2017-12-06 ENCOUNTER — Inpatient Hospital Stay (HOSPITAL_COMMUNITY): Payer: 59

## 2017-12-06 DIAGNOSIS — I6340 Cerebral infarction due to embolism of unspecified cerebral artery: Secondary | ICD-10-CM

## 2017-12-06 DIAGNOSIS — Z9911 Dependence on respirator [ventilator] status: Secondary | ICD-10-CM

## 2017-12-06 LAB — GLUCOSE, CAPILLARY
GLUCOSE-CAPILLARY: 248 mg/dL — AB (ref 70–99)
GLUCOSE-CAPILLARY: 234 mg/dL — AB (ref 70–99)
GLUCOSE-CAPILLARY: 194 mg/dL — AB (ref 70–99)
GLUCOSE-CAPILLARY: 186 mg/dL — AB (ref 70–99)
GLUCOSE-CAPILLARY: 173 mg/dL — AB (ref 70–99)
GLUCOSE-CAPILLARY: 177 mg/dL — AB (ref 70–99)
GLUCOSE-CAPILLARY: 162 mg/dL — AB (ref 70–99)
GLUCOSE-CAPILLARY: 185 mg/dL — AB (ref 70–99)
GLUCOSE-CAPILLARY: 197 mg/dL — AB (ref 70–99)
GLUCOSE-CAPILLARY: 182 mg/dL — AB (ref 70–99)
Glucose-Capillary: 230 mg/dL — ABNORMAL HIGH (ref 70–99)
Glucose-Capillary: 276 mg/dL — ABNORMAL HIGH (ref 70–99)
Glucose-Capillary: 282 mg/dL — ABNORMAL HIGH (ref 70–99)
Glucose-Capillary: 170 mg/dL — ABNORMAL HIGH (ref 70–99)
Glucose-Capillary: 187 mg/dL — ABNORMAL HIGH (ref 70–99)
Glucose-Capillary: 234 mg/dL — ABNORMAL HIGH (ref 70–99)
Glucose-Capillary: 177 mg/dL — ABNORMAL HIGH (ref 70–99)
Glucose-Capillary: 185 mg/dL — ABNORMAL HIGH (ref 70–99)
Glucose-Capillary: 164 mg/dL — ABNORMAL HIGH (ref 70–99)
Glucose-Capillary: 170 mg/dL — ABNORMAL HIGH (ref 70–99)
Glucose-Capillary: 172 mg/dL — ABNORMAL HIGH (ref 70–99)
Glucose-Capillary: 193 mg/dL — ABNORMAL HIGH (ref 70–99)
Glucose-Capillary: 216 mg/dL — ABNORMAL HIGH (ref 70–99)

## 2017-12-06 LAB — BASIC METABOLIC PANEL
ANION GAP: 14 (ref 5–15)
BUN: 49 mg/dL — ABNORMAL HIGH (ref 8–23)
CALCIUM: 10.7 mg/dL — AB (ref 8.9–10.3)
CO2: 35 mmol/L — AB (ref 22–32)
Chloride: 89 mmol/L — ABNORMAL LOW (ref 98–111)
Creatinine, Ser: 0.81 mg/dL (ref 0.61–1.24)
GFR calc Af Amer: >60 mL/min (ref >60)
Glucose, Bld: 230 mg/dL — ABNORMAL HIGH (ref 70–99)
POTASSIUM: 3.5 mmol/L (ref 3.5–5.1)
Sodium: 138 mmol/L (ref 135–145)

## 2017-12-06 LAB — BLOOD GAS, ARTERIAL
Acid-Base Excess: 12.5 mmol/L — ABNORMAL HIGH (ref 0.0–2.0)
Bicarbonate: 37.5 mmol/L — ABNORMAL HIGH (ref 20.0–28.0)
Drawn by: 252031
FIO2: 40
O2 Saturation: 99.4 %
PATIENT TEMPERATURE: 98.6
PCO2 ART: 58.2 mmHg — AB (ref 32.0–48.0)
PEEP/CPAP: 8 cmH2O
PO2 ART: 228 mmHg — AB (ref 83.0–108.0)
Pressure control: 18 cmH2O
RATE: 20 resp/min
pH, Arterial: 7.425 (ref 7.350–7.450)

## 2017-12-06 LAB — HEPARIN LEVEL (UNFRACTIONATED)
HEPARIN UNFRACTIONATED: 0.82 [IU]/mL — AB (ref 0.30–0.70)
HEPARIN UNFRACTIONATED: 0.74 [IU]/mL — AB (ref 0.30–0.70)
Heparin Unfractionated: 0.66 IU/mL (ref 0.30–0.70)

## 2017-12-06 LAB — CBC
HEMATOCRIT: 40.1 % (ref 39.0–52.0)
Hemoglobin: 12.4 g/dL — ABNORMAL LOW (ref 13.0–17.0)
MCH: 27.3 pg (ref 26.0–34.0)
MCHC: 30.9 g/dL (ref 30.0–36.0)
MCV: 88.1 fL (ref 78.0–100.0)
Platelets: 502 10*3/uL — ABNORMAL HIGH (ref 150–400)
RBC: 4.55 MIL/uL (ref 4.22–5.81)
RDW: 13.9 % (ref 11.5–15.5)
WBC: 11.7 10*3/uL — ABNORMAL HIGH (ref 4.0–10.5)

## 2017-12-06 LAB — MAGNESIUM: MAGNESIUM: 2.2 mg/dL (ref 1.7–2.4)

## 2017-12-06 LAB — PHOSPHORUS: Phosphorus: 4.2 mg/dL (ref 2.5–4.6)

## 2017-12-06 NOTE — Progress Notes (Signed)
Nutrition Follow-up  DOCUMENTATION CODES:   Morbid obesity  INTERVENTION:   Continue:  Vital High Protein @ 65 ml/h (1560 ml per day)  30 ml Prostat TID  Provides:  1860 kcals, 181 gm protein, 1310 ml free water daily.  NUTRITION DIAGNOSIS:   Inadequate oral intake related to inability to eat as evidenced by NPO status; ongoing  GOAL:   Provide needs based on ASPEN/SCCM guidelines; met  MONITOR:   TF tolerance, Labs, I & O's  ASSESSMENT:   Pt with PMH of vascular dx, CAD, COPD, GERD, HLD, HTN, OSA, afib (off anticoagulation x 3 weeks due to hemoptysis), and DM admitted 6/27 with R MCA stroke s/p tPA and revascularization x 2 on 6/27. Pt noted to have a large thyroid mass and and multiple lung nodules suspicious for metastatic thyroid cancer. Also noted coolness of R distal leg, vascular has deferred interventions for now.   Pt discussed during ICU rounds and with RN.  MD discussing [place] with family, possible trach/PEG  7/14 insulin drip added  Patient remains intubated on ventilator support  Temp (24hrs), Avg:100 F (37.8 C), Min:99.5 F (37.5 C), Max:100.5 F (38.1 C)  Medications reviewed and include: colace, lasix Labs reviewed: CBG (last 3)  Recent Labs    12/06/17 0709 12/06/17 0829 12/06/17 0934  GLUCAP 170* 187* 234*     Pt is 14 L positive; weight up 36 lb from admission weight of 312 lb/141.6 kg UOP: 1860 ml   Diet Order:   Diet Order           Diet NPO time specified  Diet effective now          EDUCATION NEEDS:   No education needs have been identified at this time  Skin:  Skin Assessment: Reviewed RN Assessment  Last BM:  7/11 large  Height:   Ht Readings from Last 1 Encounters:  11/18/17 5' 10"  (1.778 m)    Weight:   Wt Readings from Last 1 Encounters:  12/06/17 284 lb 2.8 oz (128.9 kg)    Ideal Body Weight:  75.4 kg  BMI:  Body mass index is 40.77 kg/m.  Estimated Nutritional Needs:   Kcal:  3244-0102  Protein:   151-188 grams  Fluid:  2 L/day  Maylon Peppers RD, LDN, CNSC [phone] Pager [phone] After Hours Pager

## 2017-12-06 NOTE — Progress Notes (Addendum)
Daily Progress Note   Patient Name: [name] [name]       Date: 12/06/2017 DOB: Oct 06, 1955  Age: 62 y.o. MRN#: [id number] Attending Physician: Rosalin Hawking, MD Primary Care Physician: Lorella Nimrod, MD Admit Date: 11/18/2017  Reason for Consultation/Follow-up: Establishing goals of care  Subjective: [name] [name] is unresponsive today. This is decline from last few days per family.   Length of Stay: 18  Current Medications: Scheduled Meds:  . carvedilol  6.25 mg Oral BID WC  . chlorhexidine gluconate (MEDLINE KIT)  15 mL Mouth Rinse BID  . Chlorhexidine Gluconate Cloth  6 each Topical Q0600  . docusate  100 mg Oral BID  . feeding supplement (PRO-STAT SUGAR FREE 64)  30 mL Per Tube TID  . ipratropium-albuterol  3 mL Nebulization TID  . mouth rinse  15 mL Mouth Rinse 10 times per day  . pantoprazole sodium  40 mg Per Tube Daily  . rosuvastatin  20 mg Per Tube q1800  . sodium chloride flush  10-40 mL Intracatheter Q12H  . ticagrelor  90 mg Oral BID   Or  . ticagrelor  90 mg Per Tube BID    Continuous Infusions: . sodium chloride Stopped (12/05/17 1936)  . dexmedetomidine (PRECEDEX) IV infusion 0.4 mcg/kg/hr (12/06/17 1400)  . feeding supplement (VITAL HIGH PROTEIN) Stopped (12/06/17 1402)  . fentaNYL infusion INTRAVENOUS 50 mcg/hr (12/06/17 1400)  . heparin 2,600 Units/hr (12/06/17 1400)  . insulin (NOVOLIN-R) infusion 11.7 mL/hr at 12/06/17 1400    PRN Meds: sodium chloride, acetaminophen **OR** acetaminophen (TYLENOL) oral liquid 160 mg/5 mL **OR** acetaminophen, bisacodyl, fentaNYL, fentaNYL (SUBLIMAZE) injection, lidocaine (PF), metoprolol tartrate, midazolam, sodium chloride flush  Physical Exam  Constitutional: He appears well-developed. He is intubated.  Cardiovascular: An  irregularly irregular rhythm present. Tachycardia present.  Pulmonary/Chest: No apnea and no tachypnea. He is intubated. No respiratory distress.  Tolerating pressure support/weaning mode well  Abdominal: Soft. Normal appearance.  Neurological: He is unresponsive.  Nursing note and vitals reviewed.           Vital Signs: BP 123/79   Pulse (!) 113   Temp (!) 100.9 F (38.3 C) (Oral)   Resp (!) 28   Ht _0  (1.778 m)   Wt 128.9 kg (284 lb 2.8 oz)   SpO2 98%   BMI 40.77  kg/m  SpO2: SpO2: 98 % O2 Device: O2 Device: Ventilator O2 Flow Rate:    Intake/output summary:   Intake/Output Summary (Last 24 hours) at 12/06/2017 1415 Last data filed at 12/06/2017 1400 Gross per 24 hour  Intake 3253.56 ml  Output 5210 ml  Net -1956.44 ml   LBM: Last BM Date: 12/02/17 Baseline Weight: Weight: (!) 141.6 kg (312 lb 2.7 oz) Most recent weight: Weight: 128.9 kg (284 lb 2.8 oz)       Palliative Assessment/Data:    Flowsheet Rows     Most Recent Value  Intake Tab  Referral Department  Critical care  Unit at Time of Referral  ICU  Palliative Care Primary Diagnosis  Neurology  Date Notified  12/02/17  Palliative Care Type  New Palliative care  Reason for referral  Clarify Goals of Care  Date of Admission  11/18/17  Date first seen by Palliative Care  12/03/17  # of days Palliative referral response time  1 Day(s)  # of days IP prior to Palliative referral  14  Clinical Assessment  Palliative Performance Scale Score  30%  Pain Max last 24 hours  Not able to report  Pain Min Last 24 hours  Not able to report  Dyspnea Max Last 24 Hours  Not able to report  Dyspnea Min Last 24 hours  Not able to report  Nausea Max Last 24 Hours  Not able to report  Nausea Min Last 24 Hours  Not able to report  Anxiety Max Last 24 Hours  Not able to report  Anxiety Min Last 24 Hours  Not able to report  Other Max Last 24 Hours  Not able to report  Psychosocial & Spiritual Assessment  Palliative  Care Outcomes  Patient/Family meeting held?  Yes  Who was at the meeting?  SO, 3 children, 2 step sons, DIL, brother  Palliative Care follow-up planned  Yes, Facility      Patient Active Problem List   Diagnosis Date Noted  . Fever   . Hyperglycemia   . Goals of care, counseling/discussion   . Palliative care by specialist   . Thyroid mass   . 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 Acute respiratory failure ([place])   . Claudication in peripheral vascular disease ([place]) 04/05/2017  . Hyperlipidemia LDL goal <70   . Diabetes mellitus due to underlying condition with unspecified complications ([place]) 03/18/8526  . PAF (paroxysmal atrial fibrillation) ([place]) 02/22/2017  . History of smoking 02/22/2017  . Upper airway cough syndrome 01/15/2017  . CAD (coronary artery disease) 09/16/2016  . Essential hypertension 09/11/2016  . Weight loss   . Night sweats   . OSA (obstructive sleep apnea)   . Morbid obesity due to excess calories ([place])   . Lung nodules   . Hemoptysis 08/23/2016    Palliative Care Assessment & Plan   HPI: 62 y.o. male  with past medical history of HTN, DM2, a-fib ( off AC 2/2 hemoptysis), hemoptysis, HLD admitted on 11/18/2017 with left sided weakness, slurred speech.   He was given TPA and referred for a cerebral arteriogram. He underwent endovascular revascularization of the distal basilar artery on 6/27. He was intubated for airway protection in the setting of hemoptysis 6/27. There was evidence of bleeding and erosion at 3 cm below the vocal cords.  His stroke symptoms progressed in the hospital with resultant quadriplegia, a 6th nerve palsy, and 9 nystagmus. 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  An MRI of the brain revealed an acute/subacute infarct in the central pons.  He was taken off of Eliquis prior to hospital admission secondary to hemoptysis (thought r/t thyroid malignancy).  A CT of the neck on 11/18/2017 revealed a  2.5 x 4 cm left thyroid mass with small calcified left neck lymph node suspicious for metastatic disease. A CT of the chest on 11/18/2017 revealed right upper lobe airspace disease, mediastinal adenopathy, and a 2.8 cm left thyroid nodule.  He was referred for an ultrasound-guided biopsy of a right cervical lymph node on 11/29/2017. The pathology revealed metastatic papillary thyroid carcinoma.  Assessment: I met today with son, brother, and extended family member. We had a long conversation and they asked many good questions. They acknowledge decline in mental status and concern over this today. Discussed plan for head CT today.   We discussed trach vs one way extubation, comfort care, hospice vs hospital death. Concerns for prognosis regardless of cancer was discussed. Family is concerned with the fact that Mr. Worley indicated (although understanding questionable) that he desired trach. They say that they never thought he would want that if they were to guess.   They also explain further that Mr. Musselman is extremely thoughtful and analytical in decision making (sometimes taking forever to make official decision). This is why they are so concerned that it was not explained to him that he also has cancer. 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 Kaufhold to properties and he would tell them if they should purchase or not.   65 of family seem more prone to comfort care and asking if they can reverse patient's decision for trach. I advised that this can be reversed given the fact his understanding was questionable and if he did not have the full picture to make decisions.   Family meeting with Dr. Pearline Cables tonight per family. I will follow up tomorrow.   Recommendations/Plan:  Per PCCM.   Goals of Care and Additional Recommendations:  Limitations on Scope of Treatment: TBD  Code Status:  Full code  Prognosis:   Overall prognosis very poor but will depend  on aggressiveness of care. Overall QOL expected to be extremely poor.   Discharge Planning:  To Be Determined  Care plan was discussed with RN, Dr. Pearline Cables.   Thank you for allowing the Palliative Medicine Team to assist in the care of this patient.   Total Time 60 min Prolonged Time Billed  no       Greater than 50%  of this time was spent counseling and coordinating care related to the above assessment and plan.  Vinie Sill, NP Palliative Medicine Team Pager # [phone] (M-F 8a-5p) Team Phone # [phone] (Nights/Weekends)

## 2017-12-06 NOTE — Progress Notes (Signed)
[place] for Heparin Indication: atrial fibrillation  No Known Allergies  Patient Measurements: Height: 5' 10"  (177.8 cm) Weight: 284 lb 2.8 oz (128.9 kg) IBW/kg (Calculated) : 73 Heparin Dosing Weight: 109.5  Vital Signs: Temp: 101.4 F (38.6 C) (07/15 1600) Temp Source: Axillary (07/15 1600) BP: 137/79 (07/15 1800) Pulse Rate: 112 (07/15 1800)  Labs: Recent Labs    12/04/17 0500 12/05/17 0500  12/06/17 0228 12/06/17 0925 12/06/17 1635  HGB 10.5* 11.0*  --  12.4*  --   --   HCT 33.5* 35.8*  --  40.1  --   --   PLT 363 430*  --  502*  --   --   HEPARINUNFRC 0.53 0.59   < > 0.82* 0.66 0.74*  CREATININE 0.66 0.97  --  0.81  --   --    < > = values in this interval not displayed.   Estimated Creatinine Clearance: 129.2 mL/min (by C-G formula based on SCr of 0.81 mg/dL).  Medical History: Past Medical History:  Diagnosis Date  . Collagen vascular disease ([place])   . Coronary artery disease   . GERD (gastroesophageal reflux disease)   . High cholesterol   . Hypertension   . OSA (obstructive sleep apnea)    "dx'd years ago; never have worn mask; mask never ordered" (04/01/2017)  . Pneumonia 2011  . Type II diabetes mellitus ([place]) 07/2016   Medications:  Scheduled:  . carvedilol  6.25 mg Oral BID WC  . chlorhexidine gluconate (MEDLINE KIT)  15 mL Mouth Rinse BID  . Chlorhexidine Gluconate Cloth  6 each Topical Q0600  . docusate  100 mg Oral BID  . feeding supplement (PRO-STAT SUGAR FREE 64)  30 mL Per Tube TID  . ipratropium-albuterol  3 mL Nebulization TID  . mouth rinse  15 mL Mouth Rinse 10 times per day  . pantoprazole sodium  40 mg Per Tube Daily  . rosuvastatin  20 mg Per Tube q1800  . sodium chloride flush  10-40 mL Intracatheter Q12H  . ticagrelor  90 mg Oral BID   Or  . ticagrelor  90 mg Per Tube BID   Assessment: 62 YO male admitted on 6/27 for stroke and was found to have a mass on his thyroid causing  hemoptysis. Biopsy results have identified metastatic papillary thyroid carcinoma. Patient was on Eliquis PTA for atrial fibrillation but last dose was 3 weeks PTA. Patient is currently on Aspirin and Brilinta. Pharmacy has been consulted to dose heparin for further anticoagulation.   Heparin level remains elevated despite rate decreases. Per RN, no s/s of bleeding. H/H this AM improved. Plt up to 502k  Goal of Therapy: Heparin level 0.3-0.5 units/ml Monitor platelets by anticoagulation protocol: Yes   Plan:  - Decrease heparin to 2450 units/hr - Will continue to monitor for any signs/symptoms of bleeding - F/u 6 hr HL -Daily heparin level and CBC while on heparin  -F/u with goals of care and long-term anticoagulation plan  Thanks for allowing pharmacy to be a part of this patient's care.  Albertina Parr, PharmD., BCPS Clinical Pharmacist Clinical phone for 12/06/17 until 3:30pm: [phone] If after 3:30pm, please refer to [facility] for unit-specific pharmacist

## 2017-12-06 NOTE — Progress Notes (Signed)
PULMONARY / CRITICAL CARE MEDICINE   Name: [name] [name] MRN: [id number] DOB: 1956-02-11    ADMISSION DATE:  11/18/2017 CONSULTATION DATE: 11/18/2017  REFERRING MD: Emergency department  CHIEF COMPLAINT: Stroke  HISTORY OF PRESENT ILLNESS:    [name] [name]  is a 62 year old man with atrial fibrillation, formally on anticoagulation was stopped due to recent hemoptysis.  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  He presented with an acute stroke 6/27, was intubated and potential tracheal erosion site covered with cuff balloon to facilitate TPA.  Unfortunately during the hospitalization is also experienced a second stroke and underwent mechanical thrombectomy, stent placement 7/1.  SUBJECTIVE:  He continues to be intubated and mechanically ventilated.  When his Precedex is held he is not at all interactive with me but does become tachypneic.  Family is concerned today and that when the appropriate level of care was discussed with the patient that he was not aware that he had thyroid cancer.  VITAL SIGNS: BP 104/70   Pulse (!) 101   Temp 100.3 F (37.9 C) (Axillary)   Resp (!) 27   Ht 5\' 10"  (1.778 m)   Wt 284 lb 2.8 oz (128.9 kg)   SpO2 97%   BMI 40.77 kg/m   HEMODYNAMICS:    VENTILATOR SETTINGS: Vent Mode: PCV FiO2 (%):  [40 %] 40 % Set Rate:  [20 bmp] 20 bmp PEEP:  [5 cmH20-8 cmH20] 8 cmH20 Pressure Support:  [10 cmH20-14 cmH20] 10 cmH20 Plateau Pressure:  [16 cmH20-22 cmH20] 18 cmH20  INTAKE / OUTPUT: I/O last 3 completed shifts: In: 4937.4 [I.V.:2662.4; NG/GT:2275] Out: 4098 [Urine:9035]  PHYSICAL EXAMINATION: General: Acute on chronically ell appearing male, NAD on vent, sedate HEENT: [place]/AT, PERRL, EOM-I and MMM Neuro: Sedate this AM, not moving any ext to command CV: IRIR, Nl S1/S2 and -M/R/G PULM: Coarse BS diffusely GI: Obese, soft, NT, ND and +BS Extremities: 2+ lower extremity edema Skin: No rash  Recent Labs   Lab 12/04/17 0500 12/05/17 0500 12/06/17 0228  NA 134* 135 138  K 3.6 4.0 3.5  CL 90* 88* 89*  CO2 35* 36* 35*  BUN 27* 34* 49*  CREATININE 0.66 0.97 0.81  GLUCOSE 260* 234* 230*   Electrolytes Recent Labs  Lab 12/02/17 0219  12/04/17 0500 12/05/17 0500 12/06/17 0228  CALCIUM 9.7   < > 10.2 10.5* 10.7*  MG  --    < > 1.8 2.0 2.2  PHOS 4.2  --   --  5.0* 4.2   < > = values in this interval not displayed.   CBC Recent Labs  Lab 12/04/17 0500 12/05/17 0500 12/06/17 0228  WBC 9.3 10.0 11.7*  HGB 10.5* 11.0* 12.4*  HCT 33.5* 35.8* 40.1  PLT 363 430* 502*   Coag's No results for input(s): APTT, INR in the last 168 hours.  Sepsis Markers No results for input(s): LATICACIDVEN, PROCALCITON, O2SATVEN in the last 168 hours.  ABG Recent Labs  Lab 12/05/17 0502 12/06/17 0335  PHART 7.448 7.425  PCO2ART 58.0* 58.2*  PO2ART 98.0 228*   Liver Enzymes Recent Labs  Lab 12/02/17 0219  ALBUMIN 2.3*   Cardiac Enzymes No results for input(s): TROPONINI, PROBNP in the last 168 hours.  Glucose Recent Labs  Lab 12/06/17 0606 12/06/17 0709 12/06/17 0829 12/06/17 0934 12/06/17 1037 12/06/17 1134  GLUCAP 186* 170* 187* 234* 177* 185*   Imaging Dg Chest Port 1 View  Result Date: 12/06/2017 CLINICAL DATA:  62 year old male with endotracheal  tube. Subsequent encounter. EXAM: PORTABLE CHEST 1 VIEW COMPARISON:  12/05/2017 chest x-ray. FINDINGS: Endotracheal tube tip impresses upon the right lateral wall of the upper trachea similar to prior exam 6.6 cm above the carina. Right central line tip distal superior vena cava level. Nasogastric tube courses below the diaphragm. Tip is not included on the present exam. Cardiomegaly. Calcified aorta. Pulmonary vascular congestion. Slightly improved aeration right lung base. Left base subsegmental atelectasis versus infiltrate with small left-sided pleural effusion. IMPRESSION: Improved aeration right lung base. Left base subsegmental  atelectasis versus infiltrate with small left-sided pleural effusion. Cardiomegaly. Aortic Atherosclerosis (ICD10-I70.0). Electronically Signed   By: Genia Del M.D.   On: 12/06/2017 07:31   CULTURES: 6/27 MRSA PCR >> neg 11/21/2017 sputum>> normal flora  ANTIBIOTICS: 11/18/2017 Unasyn>> 7/2 7/2 Cefotan >> 7/6  SIGNIFICANT EVENTS: 11/18/2017 intubated Right cervical lymph node biopsy 7/8 >> metastatic papillary thyroid carcinoma  LINES/TUBES: 11/18/2017 endotracheal tube>> 6/27 OGT >> 6/27 Foley >>  DISCUSSION:  ASSESSMENT / PLAN:  PULMONARY A: Vent dependent respiratory failure Possible airway compromise due to paratracheal/thyroid mass Pulmonary nodules, question metastatic Right upper lobe infiltrate at presentation, presumed pneumonia, culture negative COPD He continues to require an artificial airway both due to his mental status and likely due to mechanical compromise of the trachea.  CARDIOVASCULAR A:  History of  Paroxysmal atrial fibrillation, now on full dose heparin.     Coronary artery disease.   Peripheral vascular disease Metoprolol as needed Continue Crestor, carvedilol Continue heparin drip Lasix at same dose as outlined above Continue Brilinta,   RENAL  A:   No acute renal issues   Thyroid Mass with possible associated airway compromise Appreciate Dr. Pascal Lux assistance with interventional radiology.  Metastatic papillary thyroid carcinoma.  I would be discussing therapy with oncology later today and present oncology's impressions to the family so they can decide on the appropriate level of care.  GASTROINTESTINAL A:   History of GERD Nutrition P:   PPI as ordered Tube feeding at goal Dulcolax and colace   HEMATOLOGIC A:   Chronic anticoagulation for atrial fibrillation Off anticoagulation for 3 weeks due to hemoptysis Follow CBC on aspirin, Brilinta, heparin drip  INFECTIOUS A:   Observed aspiration, right upper lobe pneumonia P:    Following off antibiotics  ENDOCRINE Diabetes mellitus - ongoing hyperglycemia  P:   CBG q 4 SSI - resistant Lantus 45 units HS Increased novolog scheduled q 4 from 6 to 8 units on 7/10  PAD Patient had a cool right foot which resolved with removing his right femoral sheath.    NEUROLOGIC A:   Multiple strokes, initially received TPA and then underwent basilar artery thrombectomy and stent placement Appreciate neurology management.  Poor prognosis for neurological recovery Brilinta, aspirin  FAMILY: I discussed the situation at length with family today who were concerned that he is not aware that he had thyroid cancer the last time he was interactive and capable of making his wishes known.  Unfortunately he is not at all interactive today.  I have discontinued his Klonopin hoping that I will again be able to communicate with the patient.  In the interim I will be discussing the situation with oncology and presenting further information to family in the event that they become the default decision-makers.  Greater than 35 minutes has been spent in the care of this patient today.  Lars Masson, MD. Critical Care Medicine. Pager: [phone]. After hours pager: [phone].  12/06/2017, 11:37 AM

## 2017-12-06 NOTE — Progress Notes (Signed)
[place] for Heparin Indication: atrial fibrillation  No Known Allergies  Patient Measurements: Height: 5' 10"  (177.8 cm) Weight: 284 lb 2.8 oz (128.9 kg) IBW/kg (Calculated) : 73 Heparin Dosing Weight: 109.5  Vital Signs: Temp: 100.3 F (37.9 C) (07/15 0800) Temp Source: Axillary (07/15 0800) BP: 108/62 (07/15 1000) Pulse Rate: 103 (07/15 1000)  Labs: Recent Labs    12/04/17 0500 12/05/17 0500 12/05/17 1700 12/06/17 0228 12/06/17 0925  HGB 10.5* 11.0*  --  12.4*  --   HCT 33.5* 35.8*  --  40.1  --   PLT 363 430*  --  502*  --   HEPARINUNFRC 0.53 0.59 0.73* 0.82* 0.66  CREATININE 0.66 0.97  --  0.81  --    Estimated Creatinine Clearance: 129.2 mL/min (by C-G formula based on SCr of 0.81 mg/dL).  Medical History: Past Medical History:  Diagnosis Date  . Collagen vascular disease ([place])   . Coronary artery disease   . GERD (gastroesophageal reflux disease)   . High cholesterol   . Hypertension   . OSA (obstructive sleep apnea)    "dx'd years ago; never have worn mask; mask never ordered" (04/01/2017)  . Pneumonia 2011  . Type II diabetes mellitus ([place]) 07/2016   Medications:  Scheduled:  . carvedilol  6.25 mg Oral BID WC  . chlorhexidine gluconate (MEDLINE KIT)  15 mL Mouth Rinse BID  . Chlorhexidine Gluconate Cloth  6 each Topical Q0600  . clonazepam  1 mg Oral TID  . docusate  100 mg Oral BID  . feeding supplement (PRO-STAT SUGAR FREE 64)  30 mL Per Tube TID  . ipratropium-albuterol  3 mL Nebulization TID  . mouth rinse  15 mL Mouth Rinse 10 times per day  . pantoprazole sodium  40 mg Per Tube Daily  . rosuvastatin  20 mg Per Tube q1800  . sodium chloride flush  10-40 mL Intracatheter Q12H  . ticagrelor  90 mg Oral BID   Or  . ticagrelor  90 mg Per Tube BID   Assessment: 62 YO male admitted on 6/27 for stroke and was found to have a mass on his thyroid causing hemoptysis. Biopsy results have identified metastatic  papillary thyroid carcinoma. Patient was on Eliquis PTA for atrial fibrillation but last dose was 3 weeks PTA. Patient is currently on Aspirin and Brilinta. Pharmacy has been consulted to dose heparin for further anticoagulation.   Heparin level remains elevated despite rate decreases. Per RN, no s/s of bleeding.   Goal of Therapy: Heparin level 0.3-0.5 units/ml Monitor platelets by anticoagulation protocol: Yes   Plan:  - Decrease heparin to 2600 units/hr - Will continue to monitor for any signs/symptoms of bleeding - F/u 6 hr HL -Daily heparin level and CBC while on heparin  -F/u with goals of care and long-term anticoagulation plan  Thanks for allowing pharmacy to be a part of this patient's care.  Alanda Slim, PharmD, [facility] Clinical Pharmacist

## 2017-12-06 NOTE — Progress Notes (Signed)
  Speech Language Pathology Treatment: Cognitive-Linquistic  Patient Details Name: [name] [name] MRN: [id number] DOB: April 09, 1956 Today's Date: 12/06/2017 Time: 0930-1020 SLP Time Calculation (min) (ACUTE ONLY): 50 min  Assessment / Plan / Recommendation Clinical Impression  Rn cut back pts sedation much more significantly today, with at least 20 between decrease in sedation and attempts. Pt able to sustain eyes open for a greater period, but completely inconsistent ability to follow commands to open of close eyes, unable to blink. Motor response may be delayed, pt may have decreased volitional control over eyelids, lethargy certainly significant and expected to impact comprehension and ability to sustain attention to complex task. Pt clearly responsive to sounds and can turn eyes right to localize to sound but not clear that pt has adequate vision.   Long discussion with family about expectation for communication, swallowing and cognitive function. At this point, it is completely unrealistic to expect the pt to have ability to communicate wants and needs or adequately comprehend his situation. There is no way to confirm pts comprehension and pt has no way to ask questions about the complexity of his situation to make a rational, well informed decision.   Will continue to follow for attempts at basic communication.    HPI HPI: Mr. Granito  is a 62 year old man with atrial fibrillation, formally on anticoagulation was stopped due to recent hemoptysis.  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  He presented with an acute stroke 6/27, was intubated and potential tracheal erosion site covered with cuff balloon to facilitate TPA.  Unfortunately during the hospitalization is also experienced a second stroke and underwent mechanical thrombectomy, stent placement 7/1. MRI shows Acute/early subacute infarction within the central pons involving both sides, greater  on the right, measuring up to 2.6 cm, 3.6 cc. Additional punctate acute/early subacute infarctions within left lateral cerebellum and right parietal cortex. 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 Orally intubated at the time of initial assessment.       SLP Plan  Continue with current plan of care       Recommendations                   SLP Visit Diagnosis: Dysarthria and anarthria (R47.1) Plan: Continue with current plan of care       GO                Jeronda Don, Katherene Ponto 12/06/2017, 11:35 AM

## 2017-12-06 NOTE — Progress Notes (Signed)
Patient's family members concerned that patient's decision to [facility] and do everything possible (during meeting on 7/12) was not well-informed since the topic of the thyroid cancer had not been addressed at that time. At this point they are concerned that he is not able to make a well-informed decision neurologically by strictly blinking to yes and no. Family has questions about the stage of cancer and if it has progressed to a later stage they would not want to continue with life-sustaining measures. They are worried about his future care in a long-term facility and question an LTAC vs. Palliative hospice facility. MD and RN were at bedside when these questions were verbalized. Harbert Fitterer, Rande Brunt, RN

## 2017-12-06 NOTE — Progress Notes (Signed)
STROKE TEAM PROGRESS NOTE    SUBJECTIVE (INTERVAL HISTORY) His RN is at the bedside.  Pt remains intubated without movement. No acute event overnight. However, less interactive as yesterday.    OBJECTIVE Temp:  [99.5 F (37.5 C)-100.9 F (38.3 C)] 100.9 F (38.3 C) (07/15 1200) Pulse Rate:  [28-115] 104 (07/15 1500) Cardiac Rhythm: Atrial fibrillation (07/15 0800) Resp:  [18-29] 22 (07/15 1500) BP: (75-167)/(48-106) 116/63 (07/15 1522) SpO2:  [95 %-100 %] 98 % (07/15 1522) FiO2 (%):  [40 %] 40 % (07/15 1522) Weight:  [284 lb 2.8 oz (128.9 kg)] 284 lb 2.8 oz (128.9 kg) (07/15 0348)  CBC:  Recent Labs  Lab 12/05/17 0500 12/06/17 0228  WBC 10.0 11.7*  HGB 11.0* 12.4*  HCT 35.8* 40.1  MCV 87.7 88.1  PLT 430* 502*    Basic Metabolic Panel:  Recent Labs  Lab 12/05/17 0500 12/06/17 0228  NA 135 138  K 4.0 3.5  CL 88* 89*  CO2 36* 35*  GLUCOSE 234* 230*  BUN 34* 49*  CREATININE 0.97 0.81  CALCIUM 10.5* 10.7*  MG 2.0 2.2  PHOS 5.0* 4.2    Lipid Panel:     Component Value Date/Time   CHOL 110 11/19/2017 0437   CHOL 119 08/09/2017 1000   TRIG 340 (H) 11/19/2017 0437   HDL 19 (L) 11/19/2017 0437   HDL 29 (L) 08/09/2017 1000   CHOLHDL 5.8 11/19/2017 0437   VLDL 68 (H) 11/19/2017 0437   LDLCALC 23 11/19/2017 0437   LDLCALC 30 08/09/2017 1000   HgbA1c:  Lab Results  Component Value Date   HGBA1C 9.1 (H) 11/19/2017   Urine Drug Screen: No results found for: LABOPIA, COCAINSCRNUR, LABBENZ, AMPHETMU, THCU, LABBARB  Alcohol Level No results found for: [place] I have personally reviewed the radiological images below and agree with the radiology interpretations.  Ct Angio Head W Or Wo Contrast  Ct Angio Neck W Or Wo Contrast 11/18/2017 Since the study of earlier today, there are only 2 changes. There is new consolidation and volume loss in the right upper lobe. 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 Flow is seen distal to that however, presumably  indicating patent posterior communicating arteries.   Ct Angio Head W Or Wo Contrast Ct Angio Neck W Or Wo Contrast 11/18/2017 1. Severe intracranial and extracranial atherosclerotic disease. No emergent large vessel occlusion  2. Severe stenosis right carotid bifurcation due to calcified plaque, estimated 90% diameter stenosis. No significant left carotid stenosis. Severe stenosis cavernous carotid bilaterally.  3. Both vertebral arteries are patent in the neck with severe calcific stenosis V4 segment bilaterally.  4. 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 Small calcified lymph nodes left neck suspicious for metastatic disease  5. Small lung nodules on the right, possible metastatic disease. Recommend chest CT for further evaluation.   Ct Head Wo Contrast 11/19/2017 1. Expected evolution of brainstem infarct without hemorrhage.  2. No new infarcts.  3. Left vertebral artery and basilar artery stent.  4. Acute on chronic sinus disease may be related to intubation.   Ct Chest W Contrast 11/18/2017 Right upper lobe airspace opacity is noted with air bronchograms concerning for pneumonia or possibly atelectasis. Stable mediastinal adenopathy is noted compared to prior exam. Endotracheal and nasogastric tubes are in grossly good position. Coronary artery calcifications are noted suggesting coronary artery disease. Possible 2.8 cm left thyroid nodule. Thyroid ultrasound is recommended for further evaluation. Aortic Atherosclerosis (ICD10-I70.0).  Mr Brain Wo Contrast 11/19/2017 1. 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 No associated hemorrhage or significant mass effect.  2. Additional punctate acute/early subacute infarctions within left lateral cerebellum and right parietal cortex.  3. Minimal chronic microvascular ischemic changes of the brain and mild parenchymal volume loss.    Ct Head Code Stroke Wo Contrast 11/18/2017 1. No acute intracranial abnormality.  Atherosclerotic calcification  2. ASPECTS is 10   Cerebral Angiogram / Stent  11/18/2017 1. 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 Occluded non dominant RT VBJ. 2. S/P endovascular revascularization of the distal basilar artery  To 90 % patency , and of 30 to 40 % of the LT VBJ with stent placement. Dominant  Lt Pcom.  Transthoracic Echocardiogram - Left ventricle: The cavity size was moderately dilated. Wall thickness was increased in a pattern of severe LVH. Systolic function was normal. The estimated ejection fraction was in the range of 55% to 60%. Wall motion was normal; there were no regional wall motion abnormalities. The study is not technically sufficient to allow evaluation of LV diastolic function. - Aortic valve: Trileaflet. Sclerosis without stenosis. There was no regurgitation. Valve area (Vmax): 2.88 cm^2. - Aorta: Aortic root ML diameter: 41.95 mm (ED). - Aortic root: The aortic root is mildly dilated. - Mitral valve: Calcified annulus. There was trivial regurgitation. - Left atrium: The atrium was mildly dilated. - Right ventricle: The cavity size was mildly dilated. Mildly reduced systolic function. - Inferior vena cava: The vessel was normal in size. The respirophasic diameter changes were in the normal range (>= 50%), consistent with normal central venous pressure. Impressions:  Compared to a prior study in 02/2017, there have been no significant changes.  Ct Head Wo Contrast 11/24/2017 1. Continued normal expected interval evolution of brainstem infarct. No evidence for hemorrhagic transformation or other complication.  2. No other new intracranial abnormality.  3. Moderate paranasal sinus disease with bilateral mastoid effusions, likely related intubation.   Dg Chest Port 1 View 11/25/2017 Mild vascular congestion.  Tubes and lines as described.   Ct Soft Tissue Neck W Contrast 11/26/2017 Aside from the fact that the patient shows endotracheal intubation and the presence of an orogastric tube, and aside from development of bilateral layering pleural effusions with dependent atelectasis, the findings are unchanged since the previous studies. Irregular mass arising from the inferior left thyroid lobe, measuring approximately 5 x 3.4 x 5.5 cm, with invasion of the surrounding tissues worrisome for thyroid carcinoma. Partial encasement of the trachea. Broad surface along the esophagus. Small but likely pathologic nodes in the lower neck and superior mediastinum, some with calcification, probably involved by metastatic disease.   Dg Chest Port 1 View 11/29/2017 Tube and catheter positions as described without pneumothorax. There is pulmonary vascular congestion with interstitial edema and small pleural effusions. Suspect a degree of underlying congestive heart failure. No airspace consolidation.    Lymph node, needle/core biopsy - METASTATIC PAPILLARY THYROID CARCINOMA.   PHYSICAL EXAM General - morbid obesity,middle aged Caucasian male, intubated on low dose sedation.  Ophthalmologic - fundi not visualized due to noncooperation.  Cardiovascular - irregularly irregular heart rate and rhythm, afib RVR.  Neuro - intubated on low dose sedation, eyes closed, not open on voice, not following commands, not blinking to visual threat bilaterally, PERRL, eye midposition, sluggish doll's eyes. Weak  corneal bilaterally, R>L. Weak gag. Facial symmetry not able to test due to ET tube, not able to move tongue or mouth on command. No movement on all extremities even with pain stimulation, DTR diminished and no babinski. Sensation, coordination and gait not tested.   ASSESSMENT/PLAN [name] is a 62 y.o. male with PMH of DM, hemoptysis, OSA, HTN, HLD, CAD, afib off Eliquis due to hemoptysis presenting with slurred  speech, right sided weakness. Was intubated for hemoptysis prevention while giving tPA. Symptoms worsened and imaging confirmed BA occlusion. S/p IR with revascularization of BA as well as left VA s/p stenting.  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   MRI head - large acute/early subacute infarction within the central pons involving both sides, greater on the right.  CTA H&N - severe vascular athero including BA stenosis, b/l VA stenosis, right ICA proximal 90% stenosis, b/l cavernous ICA stenosis R>L.  Repeat CTA H/N - new distal BA occlusion  DSA - revascularization of the distal basilar artery to 90 % patency , and of 30 to 40 % of the LT VBJ with stent placement.  CT head 11/24/17 showed normal evolution of brainstem infarct, no acute change  2D Echo - EF 55-60%  LDL - 23  HgbA1c - 9.1  VTE prophylaxis - heparin subq  Eliquis (recently stopped) and plavix prior to admission, now on IV heparin drip.   Ongoing aggressive stroke risk factor management  Therapy recommendations:  pending  Disposition:  Pending  Palliative care on board  METASTATIC PAPILLARY THYROID CARCINOMA  CTA neck - right large thyroid mass compressing on trachea.   Thyroid mass larger than CT chest in 08/2016  Evidence of tracheal erosion on bronch at this admission  CT neck with contrast showed thyroid mass concerning for carcinoma, encasement of trachea.    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   oncology Dr Learta Codding on board  Respiratory failure  CCM on board  Current intubated on  sedation  Intermittent fentanyl for vent desynchronization  no cuff leak  Plan for trach and PEG early this week  PAF  Currently in afib RVR with respiratory distress  On eliquis in the past but off due to hemoptysis  On heparin IV now  Continue brilinta due to VA stent  Coreg bid for rate control  Cerebral vascular athero  CTA head and neck showed right ICA proximal 90% stenosis, left ICA proximal athero, b/l cavernous ICA severe athro R>L, BA severe stenosis, b/l V4 severe athero with right V4 occlusion  S/p left V4 stent and BA revascularization  On brilinta and heparin IV  Hemoptysis, improved  Since 08/2016 as per family  Not sure about etiology  TB ruled out in the past  Likely tracheal erosion from right thyroid mass   CT neck showed thyroid mass concerning for carcinoma, encasement of trachea.   CCM on board  Consider tertiary center transfer for mass debulking or tracheal resection  Fever, improving  Tmax 102.4->102.5->100.0->98.3->100.8->100.2->100.9  Off antibiotics  12/05/17 CXR - Improved aeration in the lung bases.  Hypertension  Stable on the low side  On coreg bid  Also on  lasix Q8h for fluid overload . Long term BP goal normotensive.  Hyperlipidemia  Lipid lowering medication PTA: Crestor 10 mg daily  LDL 23, goal < 70  On Crestor  Continue statin at discharge  Diabetes  HgbA1c 9.1, goal < 7.0  Uncontrolled  Back on insulin drip  Continued hyperglycemia  CCM on board   CBG monitoring  Other Stroke Risk Factors  Advanced age  Former cigarette smoker - quit, 40 pack year history  Morbid obesity, Body mass index is 40.77 kg/m.    Hx stroke/TIA  Coronary artery disease   PAD with claudication  OSA on CPAP  Other Active Problems  Leukocytosis, resolved WBC 9.3->11.4->9.3->10.0->11.7  Palliative care on board  This patient is critically ill and at significant risk of neurological worsening, death and  care requires constant monitoring of vital signs, hemodynamics,respiratory and cardiac monitoring, extensive review of multiple databases, frequent neurological assessment, discussion with family, other specialists and medical decision making of high complexity. I spent 35 minutes of neurocritical care time  in the care of  this patient.  Rosalin Hawking, MD PhD Stroke Neurology 12/06/2017 3:39 PM   To contact Stroke Continuity provider, please refer to [email or web address] After hours, contact General Neurology

## 2017-12-06 NOTE — Progress Notes (Signed)
Patient transported to CT and back to 4N19 without any apparent complications.

## 2017-12-07 DIAGNOSIS — E119 Type 2 diabetes mellitus without complications: Secondary | ICD-10-CM

## 2017-12-07 DIAGNOSIS — J9690 Respiratory failure, unspecified, unspecified whether with hypoxia or hypercapnia: Secondary | ICD-10-CM

## 2017-12-07 DIAGNOSIS — C73 Malignant neoplasm of thyroid gland: Secondary | ICD-10-CM

## 2017-12-07 DIAGNOSIS — I6389 Other cerebral infarction: Secondary | ICD-10-CM

## 2017-12-07 DIAGNOSIS — I480 Paroxysmal atrial fibrillation: Secondary | ICD-10-CM

## 2017-12-07 DIAGNOSIS — Z9911 Dependence on respirator [ventilator] status: Secondary | ICD-10-CM

## 2017-12-07 DIAGNOSIS — I739 Peripheral vascular disease, unspecified: Secondary | ICD-10-CM

## 2017-12-07 LAB — CBC WITH DIFFERENTIAL/PLATELET
Abs Immature Granulocytes: 0.1 10*3/uL (ref 0.0–0.1)
BASOS ABS: 0.1 10*3/uL (ref 0.0–0.1)
Basophils Relative: 1 %
EOS PCT: 2 %
Eosinophils Absolute: 0.2 10*3/uL (ref 0.0–0.7)
HCT: 36.7 % — ABNORMAL LOW (ref 39.0–52.0)
Hemoglobin: 11.4 g/dL — ABNORMAL LOW (ref 13.0–17.0)
Immature Granulocytes: 1 %
LYMPHS PCT: 17 %
Lymphs Abs: 1.8 10*3/uL (ref 0.7–4.0)
MCH: 27.4 pg (ref 26.0–34.0)
MCHC: 31.1 g/dL (ref 30.0–36.0)
MCV: 88.2 fL (ref 78.0–100.0)
MONO ABS: 1.1 10*3/uL — AB (ref 0.1–1.0)
Monocytes Relative: 10 %
Neutro Abs: 7.4 10*3/uL (ref 1.7–7.7)
Neutrophils Relative %: 69 %
Platelets: 420 10*3/uL — ABNORMAL HIGH (ref 150–400)
RBC: 4.16 MIL/uL — AB (ref 4.22–5.81)
RDW: 14 % (ref 11.5–15.5)
WBC: 10.7 10*3/uL — AB (ref 4.0–10.5)

## 2017-12-07 LAB — BASIC METABOLIC PANEL
Anion gap: 10 (ref 5–15)
BUN: 47 mg/dL — AB (ref 8–23)
CO2: 33 mmol/L — ABNORMAL HIGH (ref 22–32)
CREATININE: 0.7 mg/dL (ref 0.61–1.24)
Calcium: 9.9 mg/dL (ref 8.9–10.3)
Chloride: 95 mmol/L — ABNORMAL LOW (ref 98–111)
GFR calc Af Amer: >60 mL/min (ref >60)
GFR calc non Af Amer: >60 mL/min (ref >60)
GLUCOSE: 151 mg/dL — AB (ref 70–99)
POTASSIUM: 3.2 mmol/L — AB (ref 3.5–5.1)
SODIUM: 138 mmol/L (ref 135–145)

## 2017-12-07 LAB — GLUCOSE, CAPILLARY
GLUCOSE-CAPILLARY: 142 mg/dL — AB (ref 70–99)
GLUCOSE-CAPILLARY: 157 mg/dL — AB (ref 70–99)
GLUCOSE-CAPILLARY: 154 mg/dL — AB (ref 70–99)
GLUCOSE-CAPILLARY: 155 mg/dL — AB (ref 70–99)
GLUCOSE-CAPILLARY: 168 mg/dL — AB (ref 70–99)
GLUCOSE-CAPILLARY: 159 mg/dL — AB (ref 70–99)
GLUCOSE-CAPILLARY: 184 mg/dL — AB (ref 70–99)
GLUCOSE-CAPILLARY: 134 mg/dL — AB (ref 70–99)
Glucose-Capillary: 176 mg/dL — ABNORMAL HIGH (ref 70–99)
Glucose-Capillary: 143 mg/dL — ABNORMAL HIGH (ref 70–99)
Glucose-Capillary: 149 mg/dL — ABNORMAL HIGH (ref 70–99)
Glucose-Capillary: 159 mg/dL — ABNORMAL HIGH (ref 70–99)
Glucose-Capillary: 140 mg/dL — ABNORMAL HIGH (ref 70–99)
Glucose-Capillary: 159 mg/dL — ABNORMAL HIGH (ref 70–99)
Glucose-Capillary: 157 mg/dL — ABNORMAL HIGH (ref 70–99)
Glucose-Capillary: 121 mg/dL — ABNORMAL HIGH (ref 70–99)
Glucose-Capillary: 226 mg/dL — ABNORMAL HIGH (ref 70–99)
Glucose-Capillary: 208 mg/dL — ABNORMAL HIGH (ref 70–99)
Glucose-Capillary: 152 mg/dL — ABNORMAL HIGH (ref 70–99)
Glucose-Capillary: 145 mg/dL — ABNORMAL HIGH (ref 70–99)
Glucose-Capillary: 205 mg/dL — ABNORMAL HIGH (ref 70–99)

## 2017-12-07 LAB — HEPARIN LEVEL (UNFRACTIONATED)
HEPARIN UNFRACTIONATED: 0.6 [IU]/mL (ref 0.30–0.70)
HEPARIN UNFRACTIONATED: 0.53 [IU]/mL (ref 0.30–0.70)

## 2017-12-07 MED ORDER — DEXMEDETOMIDINE HCL IN NACL 400 MCG/100ML IV SOLN
0.4000 ug/kg/h | INTRAVENOUS | Status: AC
  Administered 2017-12-07: 0.8 ug/kg/h via INTRAVENOUS
  Administered 2017-12-07 (×2): 0.7 ug/kg/h via INTRAVENOUS
  Administered 2017-12-08: 1 ug/kg/h via INTRAVENOUS
  Administered 2017-12-08: 0.5 ug/kg/h via INTRAVENOUS
  Administered 2017-12-08: 0.7 ug/kg/h via INTRAVENOUS
  Administered 2017-12-08: 0.8 ug/kg/h via INTRAVENOUS
  Administered 2017-12-08 (×2): 0.7 ug/kg/h via INTRAVENOUS
  Administered 2017-12-09: 0.4 ug/kg/h via INTRAVENOUS
  Administered 2017-12-09 (×2): 0.8 ug/kg/h via INTRAVENOUS
  Administered 2017-12-09: 0.6 ug/kg/h via INTRAVENOUS
  Administered 2017-12-10: 0.4 ug/kg/h via INTRAVENOUS
  Administered 2017-12-10: 0.3 ug/kg/h via INTRAVENOUS
  Filled 2017-12-07 (×15): qty 100

## 2017-12-07 MED ORDER — POTASSIUM CHLORIDE 10 MEQ/50ML IV SOLN
10.0000 meq | INTRAVENOUS | Status: AC
  Administered 2017-12-07 (×4): 10 meq via INTRAVENOUS
  Filled 2017-12-07 (×4): qty 50

## 2017-12-07 NOTE — Progress Notes (Signed)
Palliative:  I met again today at Mr. Goody bedside. Update from RN provided regarding family discord and difficulties with decisions. Unfortunately, one family member is greatly struggling with poor prognosis and desiring trach/PEG with hopes of improvement and a miracle. I met with brother and friend Richardson Landry at bedside. We had a long conversation regarding events since yesterday and their concerns. They have resigned themselves to accepting trach/PEG but are very concerned and do NOT want him to go to a long term care facility. They are hoping that all family can come together to focus on keeping him comfortable before it reaches this point. I pointed out that realistically with trach and PEG and if continues desire for aggressive care options are limited. Discussed different scenarios dependent on if he gets off ventilator or remains vent dependant. Therapeutic listening and emotional support provided.   I also discussed if there is anything else I can be doing to be helpful to them as a family. They could not think of anything. We all agreed that daughter needs some space to process herself. I will continue to follow and support.   Exam: Resting, did not attempt to awaken d/t sedation/agitation issues this am, did not arouse to voice. No distress.   25 min  Vinie Sill, NP Palliative Medicine Team Pager # [phone] (M-F 8a-5p) Team Phone # [phone] (Nights/Weekends)

## 2017-12-07 NOTE — Progress Notes (Signed)
IP PROGRESS NOTE  Subjective:   [name] [name] appears stable.  He remains on the ventilator.  He friend and son are in the room.  Objective: Vital signs in last 24 hours: Blood pressure (!) 145/80, pulse (!) 107, temperature 99.4 F (37.4 C), temperature source Axillary, resp. rate (!) 30, height 5\' 10"  (1.778 m), weight 284 lb 9.8 oz (129.1 kg), SpO2 97 %.  Intake/Output from previous day: 07/15 0701 - 07/16 0700 In: 3123 [I.V.:1556; NG/GT:1566.9] Out: 3605 [Urine:3605]  Physical Exam:  HEENT: ETT in place Neurologic: Alert, blinks eyes to yes and no questions, not moving extremities   Lab Results: Recent Labs    12/06/17 0228 12/07/17 0239  WBC 11.7* 10.7*  HGB 12.4* 11.4*  HCT 40.1 36.7*  PLT 502* 420*    BMET Recent Labs    12/06/17 0228 12/07/17 0239  NA 138 138  K 3.5 3.2*  CL 89* 95*  CO2 35* 33*  GLUCOSE 230* 151*  BUN 49* 47*  CREATININE 0.81 0.70  CALCIUM 10.7* 9.9    No results found for: CEA1  Studies/Results: Ct Head Wo Contrast  Result Date: 12/06/2017 CLINICAL DATA:  Altered level of consciousness EXAM: CT HEAD WITHOUT CONTRAST TECHNIQUE: Contiguous axial images were obtained from the base of the skull through the vertex without intravenous contrast. COMPARISON:  11/24/2017 FINDINGS: Brain: No evidence of acute infarction, hemorrhage, extra-axial collection, ventriculomegaly, or mass effect. Low attenuation of the brainstem consistent with normal evolution of a brainstem infarct. Generalized cerebral atrophy. Periventricular white matter low attenuation likely secondary to microangiopathy. Vascular: Cerebrovascular atherosclerotic calcifications are noted. Skull: Negative for fracture or focal lesion. Sinuses/Orbits: Visualized portions of the orbits are unremarkable. Bilateral maxillary sinus mucosal thickening. Right sphenoid sinus air-fluid levels. Other: None. IMPRESSION: 1. Continued normal evolution of brainstem infarct. Otherwise, no acute  intracranial pathology. Electronically Signed   By: Kathreen Devoid   On: 12/06/2017 14:50   Dg Chest Port 1 View  Result Date: 12/06/2017 CLINICAL DATA:  62 year old male with endotracheal tube. Subsequent encounter. EXAM: PORTABLE CHEST 1 VIEW COMPARISON:  12/05/2017 chest x-ray. FINDINGS: Endotracheal tube tip impresses upon the right lateral wall of the upper trachea similar to prior exam 6.6 cm above the carina. Right central line tip distal superior vena cava level. Nasogastric tube courses below the diaphragm. Tip is not included on the present exam. Cardiomegaly. Calcified aorta. Pulmonary vascular congestion. Slightly improved aeration right lung base. Left base subsegmental atelectasis versus infiltrate with small left-sided pleural effusion. IMPRESSION: Improved aeration right lung base. Left base subsegmental atelectasis versus infiltrate with small left-sided pleural effusion. Cardiomegaly. Aortic Atherosclerosis (ICD10-I70.0). Electronically Signed   By: Genia Del M.D.   On: 12/06/2017 07:31    Medications: I have reviewed the patient's current medications.  Assessment/Plan: 1.  Papillary thyroid cancer  CT neck 11/26/2017- left thyroid mass with partial encasement of the trachea, small calcified lower cervical nodes, supraclavicular nodes, and superior mediastinal nodes concerning for metastatic disease  CT chest 11/18/2017- small mediastinal lymph nodes, stable compared to 08/23/2016, left thyroid nodule  Ultrasound-guided biopsy of a right cervical lymph node 11/29/2017-metastatic papillary carcinoma  2.  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  Diabetes  4.   Paroxysmal atrial fibrillation  5.  History of hemoptysis- potentially related to tracheal invasion from the thyroid mass, admission April 2018 with hemoptysis with bronchoscopy confirming bloody secretions from the left lower lung, no ulceration or mass was seen  6.  Ventilator dependent respiratory failure  7.  Peripheral vascular disease  8.  History of coronary artery disease  [name] [name] appears stable.  I discussed his diagnosis of advanced stage thyroid cancer with him.  We discussed expected survival without treatment and with standard therapy in patients who are otherwise well. He has suffered a severe stroke and has multiple comorbid conditions.  He is not a candidate for treatment of the thyroid cancer at present.  [name] [name] appears to understand his diagnosis of thyroid cancer.  He indicates that he would like to proceed with a tracheostomy.  I discussed the thyroid cancer diagnosis and [name] [name] current status with his family.  They understand the stroke and respiratory failure are acute issues that limit his ability to undergo treatment for thyroid cancer.  He has a poor prognosis.  Thyroid cancer may be difficult to treat in his case due to the possibility of trachea involvement with tumor.    I am available to speak with the care team and family as needed    LOS: 46 days   Betsy Coder, MD   12/07/2017, 7:39 AM

## 2017-12-07 NOTE — Progress Notes (Signed)
[place] for Heparin Indication: atrial fibrillation  No Known Allergies  Patient Measurements: Height: 5' 10"  (177.8 cm) Weight: 284 lb 9.8 oz (129.1 kg) IBW/kg (Calculated) : 73 Heparin Dosing Weight: 109.5  Vital Signs: Temp: 100.7 F (38.2 C) (07/16 0800) Temp Source: Axillary (07/16 0800) BP: 169/84 (07/16 1000) Pulse Rate: 131 (07/16 1000)  Labs: Recent Labs    12/05/17 0500  12/06/17 0228 12/06/17 0925 12/06/17 1635 12/07/17 0239  HGB 11.0*  --  12.4*  --   --  11.4*  HCT 35.8*  --  40.1  --   --  36.7*  PLT 430*  --  502*  --   --  420*  HEPARINUNFRC 0.59   < > 0.82* 0.66 0.74* 0.60  CREATININE 0.97  --  0.81  --   --  0.70   < > = values in this interval not displayed.   Estimated Creatinine Clearance: 130.8 mL/min (by C-G formula based on SCr of 0.7 mg/dL).  Medical History: Past Medical History:  Diagnosis Date  . Collagen vascular disease ([place])   . Coronary artery disease   . GERD (gastroesophageal reflux disease)   . High cholesterol   . Hypertension   . OSA (obstructive sleep apnea)    "dx'd years ago; never have worn mask; mask never ordered" (04/01/2017)  . Pneumonia 2011  . Type II diabetes mellitus ([place]) 07/2016   Medications:  Scheduled:  . carvedilol  6.25 mg Oral BID WC  . chlorhexidine gluconate (MEDLINE KIT)  15 mL Mouth Rinse BID  . Chlorhexidine Gluconate Cloth  6 each Topical Q0600  . docusate  100 mg Oral BID  . feeding supplement (PRO-STAT SUGAR FREE 64)  30 mL Per Tube TID  . ipratropium-albuterol  3 mL Nebulization TID  . mouth rinse  15 mL Mouth Rinse 10 times per day  . pantoprazole sodium  40 mg Per Tube Daily  . rosuvastatin  20 mg Per Tube q1800  . sodium chloride flush  10-40 mL Intracatheter Q12H  . ticagrelor  90 mg Oral BID   Or  . ticagrelor  90 mg Per Tube BID   Assessment: 62 YO male admitted on 6/27 for stroke and was found to have a mass on his thyroid causing  hemoptysis. Biopsy results have identified metastatic papillary thyroid carcinoma. Patient was on Eliquis PTA for atrial fibrillation but last dose was 3 weeks PTA. Patient is currently on Aspirin and Brilinta. Pharmacy has been consulted to dose heparin for further anticoagulation.   Last heparin level was therapeutic at 0.53 but since shooting for lower end of goal will plan to decrease slightly. Hgb 11.4, plts 420.  Goal of Therapy: Heparin level 0.3-0.5 units/ml Monitor platelets by anticoagulation protocol: Yes   Plan:  Decrease heparin gtt slightly to 2,300 units/hr Monitor daily heparin level, CBC, s/s of bleed  Thanks for allowing pharmacy to be a part of this patient's care.  Elenor Quinones, PharmD, BCPS Clinical Pharmacist Phone number [phone] 12/07/2017 11:07 AM

## 2017-12-07 NOTE — Progress Notes (Signed)
SLP Cancellation Note  Patient Details Name: [name] [name] MRN: [id number] DOB: August 13, 1955   Cancelled treatment:       Reason Eval/Treat Not Completed: Other (comment). Pt fatigued and sedated after a trial of lifting sedation this am to communicate with MD. Per RN report and MD note, even with minimal sedation pt unable to consistently utilize blinking to communicate Y/N. SLP discussed this with pts siblings yesterday; pt is not expected to successfully participate in meaningful, consistent, reliable communication in the near future. Will continue efforts, but encouraged family yesterday to assume pt will not be able to state his wishes for plan of care yet.   [name], [place] CCC-SLP [phone]  Lynann Beaver 12/07/2017, 11:04 AM

## 2017-12-07 NOTE — Progress Notes (Addendum)
ANTICOAGULATION CONSULT NOTE   Pharmacy Consult for Heparin Indication: atrial fibrillation  No Known Allergies  Patient Measurements: Height: 5' 10" (177.8 cm) Weight: 284 lb 2.8 oz (128.9 kg) IBW/kg (Calculated) : 73 Heparin Dosing Weight: 109.5  Vital Signs: Temp: 99 F (37.2 C) (07/16 0000) Temp Source: Axillary (07/16 0000) BP: 107/59 (07/16 0200) Pulse Rate: 111 (07/16 0200)  Labs: Recent Labs    12/05/17 0500  12/06/17 0228 12/06/17 0925 12/06/17 1635 12/07/17 0239  HGB 11.0*  --  12.4*  --   --  11.4*  HCT 35.8*  --  40.1  --   --  36.7*  PLT 430*  --  502*  --   --  420*  HEPARINUNFRC 0.59   < > 0.82* 0.66 0.74* 0.60  CREATININE 0.97  --  0.81  --   --  0.70   < > = values in this interval not displayed.   Estimated Creatinine Clearance: 130.8 mL/min (by C-G formula based on SCr of 0.7 mg/dL).  Medical History: Past Medical History:  Diagnosis Date  . Collagen vascular disease (HCC)   . Coronary artery disease   . GERD (gastroesophageal reflux disease)   . High cholesterol   . Hypertension   . OSA (obstructive sleep apnea)    "dx'd years ago; never have worn mask; mask never ordered" (04/01/2017)  . Pneumonia 2011  . Type II diabetes mellitus (HCC) 07/2016   Medications:  Scheduled:  . carvedilol  6.25 mg Oral BID WC  . chlorhexidine gluconate (MEDLINE KIT)  15 mL Mouth Rinse BID  . Chlorhexidine Gluconate Cloth  6 each Topical Q0600  . docusate  100 mg Oral BID  . feeding supplement (PRO-STAT SUGAR FREE 64)  30 mL Per Tube TID  . ipratropium-albuterol  3 mL Nebulization TID  . mouth rinse  15 mL Mouth Rinse 10 times per day  . pantoprazole sodium  40 mg Per Tube Daily  . rosuvastatin  20 mg Per Tube q1800  . sodium chloride flush  10-40 mL Intracatheter Q12H  . ticagrelor  90 mg Oral BID   Or  . ticagrelor  90 mg Per Tube BID   Assessment: 62 YO male admitted on 6/27 for stroke and was found to have a mass on his thyroid causing hemoptysis.  Biopsy results have identified metastatic papillary thyroid carcinoma. Patient was on Eliquis PTA for atrial fibrillation but last dose was 3 weeks PTA. Patient is currently on Aspirin and Brilinta. Pharmacy has been consulted to dose heparin for further anticoagulation.   Heparin level still above goal range Goal of Therapy: Heparin level 0.3-0.5 units/ml Monitor platelets by anticoagulation protocol: Yes   Plan:  - Decrease heparin to 2350 units/hr - Will continue to monitor for any signs/symptoms of bleeding - F/u 6 hr HL -Daily heparin level and CBC while on heparin  -F/u with goals of care and long-term anticoagulation plan  Thanks for allowing pharmacy to be a part of this patient's care.  Lora Seay, PharmD Clinical Pharmacist     

## 2017-12-07 NOTE — Progress Notes (Signed)
STROKE TEAM PROGRESS NOTE   SUBJECTIVE (INTERVAL HISTORY) His RN and girlfriend are at the bedside. I also answered his son [name] Jr.'s question over the phone. Pt remains intubated without movement. No acute event overnight. Off clonazepam, pt somehow more awake than yesterday but still very lethargic and sleepy.    OBJECTIVE Temp:  [99 F (37.2 C)-101.4 F (38.6 C)] 100.7 F (38.2 C) (07/16 0800) Pulse Rate:  [60-131] 124 (07/16 1100) Cardiac Rhythm: Atrial fibrillation (07/16 0800) Resp:  [22-31] 26 (07/16 1100) BP: (93-169)/(48-94) 134/72 (07/16 1100) SpO2:  [95 %-100 %] 97 % (07/16 1100) FiO2 (%):  [40 %] 40 % (07/16 0803) Weight:  [284 lb 9.8 oz (129.1 kg)] 284 lb 9.8 oz (129.1 kg) (07/16 0500)  CBC:  Recent Labs  Lab 12/06/17 0228 12/07/17 0239  WBC 11.7* 10.7*  NEUTROABS  --  7.4  HGB 12.4* 11.4*  HCT 40.1 36.7*  MCV 88.1 88.2  PLT 502* 420*    Basic Metabolic Panel:  Recent Labs  Lab 12/05/17 0500 12/06/17 0228 12/07/17 0239  NA 135 138 138  K 4.0 3.5 3.2*  CL 88* 89* 95*  CO2 36* 35* 33*  GLUCOSE 234* 230* 151*  BUN 34* 49* 47*  CREATININE 0.97 0.81 0.70  CALCIUM 10.5* 10.7* 9.9  MG 2.0 2.2  --   PHOS 5.0* 4.2  --     Lipid Panel:     Component Value Date/Time   CHOL 110 11/19/2017 0437   CHOL 119 08/09/2017 1000   TRIG 340 (H) 11/19/2017 0437   HDL 19 (L) 11/19/2017 0437   HDL 29 (L) 08/09/2017 1000   CHOLHDL 5.8 11/19/2017 0437   VLDL 68 (H) 11/19/2017 0437   LDLCALC 23 11/19/2017 0437   LDLCALC 30 08/09/2017 1000   HgbA1c:  Lab Results  Component Value Date   HGBA1C 9.1 (H) 11/19/2017   Urine Drug Screen: No results found for: LABOPIA, COCAINSCRNUR, LABBENZ, AMPHETMU, THCU, LABBARB  Alcohol Level No results found for: [place] I have personally reviewed the radiological images below and agree with the radiology interpretations.  Ct Angio Head W Or Wo Contrast  Ct Angio Neck W Or Wo Contrast 11/18/2017 Since the study of  earlier today, there are only 2 changes. There is new consolidation and volume loss in the right upper lobe. 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 Flow is seen distal to that however, presumably indicating patent posterior communicating arteries.   Ct Angio Head W Or Wo Contrast Ct Angio Neck W Or Wo Contrast 11/18/2017 1. Severe intracranial and extracranial atherosclerotic disease. No emergent large vessel occlusion  2. Severe stenosis right carotid bifurcation due to calcified plaque, estimated 90% diameter stenosis. No significant left carotid stenosis. Severe stenosis cavernous carotid bilaterally.  3. Both vertebral arteries are patent in the neck with severe calcific stenosis V4 segment bilaterally.  4. 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 Small calcified lymph nodes left neck suspicious for metastatic disease  5. Small lung nodules on the right, possible metastatic disease. Recommend chest CT for further evaluation.   Ct Head Wo Contrast 11/19/2017 1. Expected evolution of brainstem infarct without hemorrhage.  2. No new infarcts.  3. Left vertebral artery and basilar artery stent.  4. Acute on chronic sinus disease may be related to intubation.   Ct Chest W Contrast 11/18/2017 Right upper lobe airspace opacity is noted with air bronchograms concerning for pneumonia or possibly atelectasis. Stable mediastinal adenopathy  is noted compared to prior exam. Endotracheal and nasogastric tubes are in grossly good position. Coronary artery calcifications are noted suggesting coronary artery disease. Possible 2.8 cm left thyroid nodule. Thyroid ultrasound is recommended for further evaluation. Aortic Atherosclerosis (ICD10-I70.0).   Mr Brain Wo Contrast 11/19/2017 1. 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 No associated hemorrhage or significant mass effect.  2.  Additional punctate acute/early subacute infarctions within left lateral cerebellum and right parietal cortex.  3. Minimal chronic microvascular ischemic changes of the brain and mild parenchymal volume loss.   Ct Head Code Stroke Wo Contrast 11/18/2017 1. No acute intracranial abnormality.  Atherosclerotic calcification  2. ASPECTS is 10   Cerebral Angiogram / Stent  11/18/2017 1. 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 Occluded non dominant RT VBJ. 2. S/P endovascular revascularization of the distal basilar artery  To 90 % patency , and of 30 to 40 % of the LT VBJ with stent placement. Dominant  Lt Pcom.  Transthoracic Echocardiogram - Left ventricle: The cavity size was moderately dilated. Wall thickness was increased in a pattern of severe LVH. Systolic function was normal. The estimated ejection fraction was in the range of 55% to 60%. Wall motion was normal; there were no regional wall motion abnormalities. The study is not technically sufficient to allow evaluation of LV diastolic function. - Aortic valve: Trileaflet. Sclerosis without stenosis. There was no regurgitation. Valve area (Vmax): 2.88 cm^2. - Aorta: Aortic root ML diameter: 41.95 mm (ED). - Aortic root: The aortic root is mildly dilated. - Mitral valve: Calcified annulus. There was trivial regurgitation. - Left atrium: The atrium was mildly dilated. - Right ventricle: The cavity size was mildly dilated. Mildly reduced systolic function. - Inferior vena cava: The vessel was normal in size. The respirophasic diameter changes were in the normal range (>= 50%), consistent with normal central venous pressure. Impressions:  Compared to a prior study in 02/2017, there have been no significant changes.  Ct Head Wo Contrast 11/24/2017 1. Continued normal expected interval evolution of brainstem infarct. No evidence for hemorrhagic transformation or other complication.   2. No other new intracranial abnormality.  3. Moderate paranasal sinus disease with bilateral mastoid effusions, likely related intubation.   Dg Chest Port 1 View 11/25/2017 Mild vascular congestion. Tubes and lines as described.   Ct Soft Tissue Neck W Contrast 11/26/2017 Aside from the fact that the patient shows endotracheal intubation and the presence of an orogastric tube, and aside from development of bilateral layering pleural effusions with dependent atelectasis, the findings are unchanged since the previous studies. Irregular mass arising from the inferior left thyroid lobe, measuring approximately 5 x 3.4 x 5.5 cm, with invasion of the surrounding tissues worrisome for thyroid carcinoma. Partial encasement of the trachea. Broad surface along the esophagus. Small but likely pathologic nodes in the lower neck and superior mediastinum, some with calcification, probably involved by metastatic disease.   Dg Chest Port 1 View 11/29/2017 Tube and catheter positions as described without pneumothorax. There is pulmonary vascular congestion with interstitial edema and small pleural effusions. Suspect a degree of underlying congestive heart failure. No airspace consolidation.    Lymph node, needle/core biopsy - METASTATIC PAPILLARY THYROID CARCINOMA.   PHYSICAL EXAM General - morbid obesity,middle aged Caucasian male, intubated on low dose sedation.  Ophthalmologic - fundi not visualized due to  noncooperation.  Cardiovascular - irregularly irregular heart rate and rhythm, afib RVR.  Neuro - intubated on low dose sedation, eyes closed, not open on voice, not following commands, not blinking to visual threat bilaterally, PERRL, eye midposition, sluggish doll's eyes. Weak corneal bilaterally, R>L. Weak gag. Facial symmetry not able to test due to ET tube, not able to move tongue or mouth on command. No movement on all extremities even with pain stimulation, DTR diminished and no babinski.  Sensation, coordination and gait not tested.   ASSESSMENT/PLAN [name] is a 62 y.o. male with PMH of DM, hemoptysis, OSA, HTN, HLD, CAD, afib off Eliquis due to hemoptysis presenting with slurred speech, right sided weakness. Was intubated for hemoptysis prevention while giving tPA. Symptoms worsened and imaging confirmed BA occlusion. S/p IR with revascularization of BA as well as left VA s/p stenting.  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   MRI head - large acute/early subacute infarction within the central pons involving both sides, greater on the right.  CTA H&N - severe vascular athero including BA stenosis, b/l VA stenosis, right ICA proximal 90% stenosis, b/l cavernous ICA stenosis R>L.  Repeat CTA H/N - new distal BA occlusion  DSA - revascularization of the distal basilar artery to 90 % patency , and of 30 to 40 % of the LT VBJ with stent placement.  CT head 11/24/17 showed normal evolution of brainstem infarct, no acute change  2D Echo - EF 55-60%  LDL - 23  HgbA1c - 9.1  VTE prophylaxis - heparin subq  Eliquis (recently stopped) and plavix prior to admission, now on IV heparin drip.   Ongoing aggressive stroke risk factor management  Therapy recommendations:  pending  Disposition:  Pending  Palliative care on board  METASTATIC PAPILLARY THYROID CARCINOMA  CTA neck - right large thyroid mass compressing on trachea.   Thyroid mass larger than CT chest in 08/2016  Evidence of tracheal erosion on bronch at this admission  CT neck with contrast showed thyroid mass concerning for  carcinoma, encasement of trachea.    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   oncology Dr Learta Codding on board  Respiratory failure  CCM on board  Current intubated on sedation  Intermittent fentanyl for vent desynchronization  no cuff leak  Plan for trach and PEG this week  PAF  Currently in afib RVR with respiratory distress  On eliquis prior but off due to hemoptysis  On heparin IV now  Continue brilinta due to VA stent  Coreg bid for rate control  Cerebral vascular athero  CTA head and neck showed right ICA proximal 90% stenosis, left ICA proximal athero, b/l cavernous ICA severe athro R>L, BA severe stenosis, b/l V4 severe athero with right V4 occlusion  S/p left V4 stent and BA revascularization  On brilinta and heparin IV  Hemoptysis, improved  Since 08/2016 as per family  Not sure about etiology  TB ruled out in the past  Likely tracheal erosion from right thyroid mass   CT neck showed thyroid mass concerning for carcinoma, encasement of trachea.   CCM on board  Consider tertiary center transfer for  mass debulking or tracheal resection  Fever  Continued low grade fever  Off antibiotics  12/05/17 CXR - Improved aeration in the lung bases.  Hypertension  Stable  On coreg bid  Also on lasix Q8h for fluid overload . Long term BP goal normotensive.  Hyperlipidemia  Lipid lowering medication PTA: Crestor 10 mg daily  LDL 23, goal < 70  On Crestor  Continue statin at discharge  Diabetes  HgbA1c 9.1, goal < 7.0  Uncontrolled  continued on insulin drip  Continued hyperglycemia  CCM on board   CBG monitoring  Other Stroke Risk Factors  Advanced age  Former cigarette smoker - quit, 40 pack year history  Morbid obesity, Body mass index is 40.84 kg/m.    Hx stroke/TIA  Coronary artery disease   PAD with claudication  OSA on CPAP  Other Active Problems  Leukocytosis, resolved WBC  9.3->11.4->9.3->10.0->11.7->10.7  Palliative care on board  This patient is critically ill and at significant risk of neurological worsening, death and care requires constant monitoring of vital signs, hemodynamics,respiratory and cardiac monitoring, extensive review of multiple databases, frequent neurological assessment, discussion with family, other specialists and medical decision making of high complexity. I spent 35 minutes of neurocritical care time  in the care of  this patient. I had long discussion with girlfriend at bedside, updated pt current condition, treatment plan and potential prognosis. I also answered question of his son over the phone. They expressed understanding and appreciation. I also discussed with Dr. Dominica Severin in the hallway.    Rosalin Hawking, MD PhD Stroke Neurology 12/07/2017 11:22 AM   To contact Stroke Continuity provider, please refer to [email or web address] After hours, contact General Neurology

## 2017-12-07 NOTE — Progress Notes (Signed)
PULMONARY / CRITICAL CARE MEDICINE   Name: [name] [name] MRN: [id number] DOB: Nov 23, 1955    ADMISSION DATE:  11/18/2017 CONSULTATION DATE: 11/18/2017  REFERRING MD: Emergency department  CHIEF COMPLAINT: Stroke  HISTORY OF PRESENT ILLNESS:    [name] [name]  is a 62 year old man with atrial fibrillation, formally on anticoagulation was stopped due to recent hemoptysis.  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  He presented with an acute stroke 6/27, was intubated and potential tracheal erosion site covered with cuff balloon to facilitate TPA.  Unfortunately during the hospitalization is also experienced a second stroke and underwent mechanical thrombectomy, stent placement 7/1.  SUBJECTIVE:  He continues to be intubated and mechanically ventilated.  His Klonopin was discontinued yesterday and I held his Precedex this morning to see if he was sufficiently interactive in order to settle issues about the level of care that he would desire if he knew his current situation.  While off Precedex, he became relatively labored, he did intermittently open eyes to voice, but he did not consistently blink and answers to questions using a one blink for yes and to blink for no code.    VITAL SIGNS: BP (!) 169/84   Pulse (!) 131   Temp (!) 100.7 F (38.2 C) (Axillary)   Resp (!) 31   Ht 5\' 10"  (1.778 m)   Wt 284 lb 9.8 oz (129.1 kg)   SpO2 97%   BMI 40.84 kg/m   HEMODYNAMICS:    VENTILATOR SETTINGS: Vent Mode: PCV FiO2 (%):  [40 %] 40 % Set Rate:  [20 bmp] 20 bmp PEEP:  [8 cmH20] 8 cmH20 Pressure Support:  [10 cmH20-20 cmH20] 20 cmH20 Plateau Pressure:  [17 cmH20-23 cmH20] 23 cmH20  INTAKE / OUTPUT: I/O last 3 completed shifts: In: 4771.7 [I.V.:2424.8; NG/GT:2346.9] Out: 6063 [Urine:5415]  PHYSICAL EXAMINATION: General: Middle-aged obese male, orally intubated and somewhat tachypneic with paradoxical breathing  HEENT: [place]/AT, PERRL, EOM-I  and MMM Neuro: Eye opens to voice but does not consistently respond to questions.  He cannot identify our location with appropriate yes or no blinks.  No limb movement.   CV: Irregularly irregular without murmur rub or gallop  PULM: Somewhat labored this morning with sedation discontinued.  There is symmetric air movement and coarse breath sounds throughout.   GI: The abdomen is obese soft nontender without any organomegaly.   Extremities: Skin: No rash  Recent Labs  Lab 12/05/17 0500 12/06/17 0228 12/07/17 0239  NA 135 138 138  K 4.0 3.5 3.2*  CL 88* 89* 95*  CO2 36* 35* 33*  BUN 34* 49* 47*  CREATININE 0.97 0.81 0.70  GLUCOSE 234* 230* 151*   Electrolytes Recent Labs  Lab 12/02/17 0219  12/04/17 0500 12/05/17 0500 12/06/17 0228 12/07/17 0239  CALCIUM 9.7   < > 10.2 10.5* 10.7* 9.9  MG  --    < > 1.8 2.0 2.2  --   PHOS 4.2  --   --  5.0* 4.2  --    < > = values in this interval not displayed.   CBC Recent Labs  Lab 12/05/17 0500 12/06/17 0228 12/07/17 0239  WBC 10.0 11.7* 10.7*  HGB 11.0* 12.4* 11.4*  HCT 35.8* 40.1 36.7*  PLT 430* 502* 420*   Coag's No results for input(s): APTT, INR in the last 168 hours.  Sepsis Markers No results for input(s): LATICACIDVEN, PROCALCITON, O2SATVEN in the last 168 hours.  ABG Recent Labs  Lab 12/05/17 0502 12/06/17  0335  PHART 7.448 7.425  PCO2ART 58.0* 58.2*  PO2ART 98.0 228*   Liver Enzymes Recent Labs  Lab 12/02/17 0219  ALBUMIN 2.3*   Cardiac Enzymes No results for input(s): TROPONINI, PROBNP in the last 168 hours.  Glucose Recent Labs  Lab 12/07/17 0413 12/07/17 0520 12/07/17 0601 12/07/17 0737 12/07/17 0840 12/07/17 0947  GLUCAP 149* 159* 154* 155* 140* 168*   Imaging Ct Head Wo Contrast  Result Date: 12/06/2017 CLINICAL DATA:  Altered level of consciousness EXAM: CT HEAD WITHOUT CONTRAST TECHNIQUE: Contiguous axial images were obtained from the base of the skull through the vertex without  intravenous contrast. COMPARISON:  11/24/2017 FINDINGS: Brain: No evidence of acute infarction, hemorrhage, extra-axial collection, ventriculomegaly, or mass effect. Low attenuation of the brainstem consistent with normal evolution of a brainstem infarct. Generalized cerebral atrophy. Periventricular white matter low attenuation likely secondary to microangiopathy. Vascular: Cerebrovascular atherosclerotic calcifications are noted. Skull: Negative for fracture or focal lesion. Sinuses/Orbits: Visualized portions of the orbits are unremarkable. Bilateral maxillary sinus mucosal thickening. Right sphenoid sinus air-fluid levels. Other: None. IMPRESSION: 1. Continued normal evolution of brainstem infarct. Otherwise, no acute intracranial pathology. Electronically Signed   By: Kathreen Devoid   On: 12/06/2017 14:50   CULTURES: 6/27 MRSA PCR >> neg 11/21/2017 sputum>> normal flora  ANTIBIOTICS: 11/18/2017 Unasyn>> 7/2 7/2 Cefotan >> 7/6  SIGNIFICANT EVENTS: 11/18/2017 intubated Right cervical lymph node biopsy 7/8 >> metastatic papillary thyroid carcinoma  LINES/TUBES: 11/18/2017 endotracheal tube>> 6/27 OGT >> 6/27 Foley >>  DISCUSSION: This is a 62 year old who is suffered from a post years circulation infarct and has had a stent placed in the vertebrobasilar system.  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  He also has a thyroid cancer which is encasing the trachea, which is likely invaded the trachea and was responsible for hemoptysis prior to admission.  We have attempted to determine the patient's desires as to the appropriate level of care, and unfortunately he is not sufficiently or consistently interactive enough to make his own wishes known in this setting.  ASSESSMENT / PLAN:  PULMONARY A: Vent dependent respiratory failure Possible airway compromise due to paratracheal/thyroid mass Pulmonary nodules, question metastatic Right upper lobe infiltrate at presentation, presumed pneumonia,  culture negative COPD He continues to require an artificial airway both due to his mental status and likely due to mechanical compromise of the trachea.  CARDIOVASCULAR A:  History of  Paroxysmal atrial fibrillation, now on full dose heparin.     Coronary artery disease.   Peripheral vascular disease Metoprolol as needed Continue Crestor, carvedilol Continue heparin drip Lasix at same dose as outlined above Continue Brilinta,   RENAL  A:   No acute renal issues   Thyroid Mass with possible associated airway compromise Diagnosis of papillary carcinoma established by biopsy during this admission.  GASTROINTESTINAL A:   History of GERD Nutrition P:   PPI as ordered Tube feeding at goal Dulcolax and colace   HEMATOLOGIC A:   Chronic anticoagulation for atrial fibrillation Off anticoagulation for 3 weeks due to hemoptysis Follow CBC on aspirin, Brilinta, heparin drip  INFECTIOUS A:   Observed aspiration, right upper lobe pneumonia P:   Following off antibiotics  ENDOCRINE Diabetes mellitus - ongoing hyperglycemia  P:   CBG q 4 SSI - resistant Lantus 45 units HS Increased novolog scheduled q 4 from 6 to 8 units on 7/10  PAD Patient had a cool right foot which resolved with removing his right femoral sheath.  NEUROLOGIC A:   Multiple strokes, initially received TPA and then underwent basilar artery thrombectomy and stent placement Appreciate neurology management.  Poor prognosis for neurological recovery Brilinta, aspirin  FAMILY: Family has asked that I discussed the situation with each of them individually in the hopes of coming to some consensus as to the appropriate level of care.  I will attempt to do so this afternoon  Greater than 35 minutes has been spent in the care of this patient today.  Lars Masson, MD. Critical Care Medicine. Pager: [phone]. After hours pager: [phone].  12/07/2017, 10:24 AM

## 2017-12-07 NOTE — Progress Notes (Signed)
[facility] ADULT ICU REPLACEMENT PROTOCOL FOR AM LAB REPLACEMENT ONLY  The patient does apply for the [facility] Adult ICU Electrolyte Replacment Protocol based on the criteria listed below:   1. Is GFR >/= 40 ml/min? Yes.    Patient's GFR today is >60 2. Is urine output >/= 0.5 ml/kg/hr for the last 6 hours? Yes.   Patient's UOP is 0.8 ml/kg/hr 3. Is BUN < 60 mg/dL? Yes.    Patient's BUN today is 47 4. Abnormal electrolyte(s): k 3.2 5. Ordered repletion with: protocol 6. If a panic level lab has been reported, has the CCM MD in charge been notified? No..   Physician:    Ronda Fairly A 12/07/2017 6:20 AM

## 2017-12-07 NOTE — Progress Notes (Signed)
Patient's HR sustaining in the 120's-130's even after PRN medications were given. Dr. Pearline Cables aware. Sedation has been titrated and Fentanyl boluses given for vent synchrony and respiratory distress. Lee Kuang, Rande Brunt, RN

## 2017-12-07 NOTE — Progress Notes (Addendum)
Spoke with Dr. Redmond Baseman' secretary Lattie Haw [phone]). She has put patient on the schedule for a Trach at 1230 on 7/17 but is messaging Dr. Redmond Baseman to confirm. He is currently in [place] for a class and will return tomorrow. Per Dr. Pearline Cables to hold am Brilinta and Heparin. Will hand off to oncoming RNs. Also need to stop tube feeding at midnight for prep. Marland KitchenHocutt, Rande Brunt, RN     Update from [facility]: Dr. Redmond Baseman is unable to do Trach on Wednesday but can do it early Thursday (7/18) morning. He will touch base with Dr. Pearline Cables in the meantime. All medications and tube feeding will resume until closer to procedure. Othal Kubitz, Rande Brunt, RN

## 2017-12-08 LAB — CBC WITH DIFFERENTIAL/PLATELET
ABS IMMATURE GRANULOCYTES: 0.1 10*3/uL (ref 0.0–0.1)
BASOS PCT: 0 %
Basophils Absolute: 0 10*3/uL (ref 0.0–0.1)
Eosinophils Absolute: 0.1 10*3/uL (ref 0.0–0.7)
Eosinophils Relative: 0 %
HEMATOCRIT: 35.3 % — AB (ref 39.0–52.0)
HEMOGLOBIN: 10.6 g/dL — AB (ref 13.0–17.0)
IMMATURE GRANULOCYTES: 1 %
LYMPHS ABS: 1.5 10*3/uL (ref 0.7–4.0)
LYMPHS PCT: 12 %
MCH: 27.1 pg (ref 26.0–34.0)
MCHC: 30 g/dL (ref 30.0–36.0)
MCV: 90.3 fL (ref 78.0–100.0)
MONOS PCT: 6 %
Monocytes Absolute: 0.7 10*3/uL (ref 0.1–1.0)
NEUTROS ABS: 10.1 10*3/uL — AB (ref 1.7–7.7)
Neutrophils Relative %: 81 %
PLATELETS: 376 10*3/uL (ref 150–400)
RBC: 3.91 MIL/uL — ABNORMAL LOW (ref 4.22–5.81)
RDW: 14.2 % (ref 11.5–15.5)
WBC: 12.6 10*3/uL — AB (ref 4.0–10.5)

## 2017-12-08 LAB — GLUCOSE, CAPILLARY
GLUCOSE-CAPILLARY: 127 mg/dL — AB (ref 70–99)
GLUCOSE-CAPILLARY: 135 mg/dL — AB (ref 70–99)
GLUCOSE-CAPILLARY: 150 mg/dL — AB (ref 70–99)
GLUCOSE-CAPILLARY: 156 mg/dL — AB (ref 70–99)
GLUCOSE-CAPILLARY: 157 mg/dL — AB (ref 70–99)
GLUCOSE-CAPILLARY: 162 mg/dL — AB (ref 70–99)
GLUCOSE-CAPILLARY: 165 mg/dL — AB (ref 70–99)
GLUCOSE-CAPILLARY: 166 mg/dL — AB (ref 70–99)
GLUCOSE-CAPILLARY: 181 mg/dL — AB (ref 70–99)
GLUCOSE-CAPILLARY: 182 mg/dL — AB (ref 70–99)
GLUCOSE-CAPILLARY: 192 mg/dL — AB (ref 70–99)
Glucose-Capillary: 208 mg/dL — ABNORMAL HIGH (ref 70–99)
Glucose-Capillary: 166 mg/dL — ABNORMAL HIGH (ref 70–99)
Glucose-Capillary: 185 mg/dL — ABNORMAL HIGH (ref 70–99)
Glucose-Capillary: 152 mg/dL — ABNORMAL HIGH (ref 70–99)
Glucose-Capillary: 118 mg/dL — ABNORMAL HIGH (ref 70–99)
Glucose-Capillary: 159 mg/dL — ABNORMAL HIGH (ref 70–99)
Glucose-Capillary: 192 mg/dL — ABNORMAL HIGH (ref 70–99)
Glucose-Capillary: 145 mg/dL — ABNORMAL HIGH (ref 70–99)
Glucose-Capillary: 148 mg/dL — ABNORMAL HIGH (ref 70–99)
Glucose-Capillary: 170 mg/dL — ABNORMAL HIGH (ref 70–99)
Glucose-Capillary: 174 mg/dL — ABNORMAL HIGH (ref 70–99)
Glucose-Capillary: 150 mg/dL — ABNORMAL HIGH (ref 70–99)

## 2017-12-08 LAB — BASIC METABOLIC PANEL
Anion gap: 9 (ref 5–15)
BUN: 46 mg/dL — AB (ref 8–23)
CALCIUM: 9.4 mg/dL (ref 8.9–10.3)
CHLORIDE: 99 mmol/L (ref 98–111)
CO2: 32 mmol/L (ref 22–32)
CREATININE: 0.7 mg/dL (ref 0.61–1.24)
GFR calc Af Amer: >60 mL/min (ref >60)
GFR calc non Af Amer: >60 mL/min (ref >60)
GLUCOSE: 169 mg/dL — AB (ref 70–99)
Potassium: 3.1 mmol/L — ABNORMAL LOW (ref 3.5–5.1)
Sodium: 140 mmol/L (ref 135–145)

## 2017-12-08 LAB — HEPARIN LEVEL (UNFRACTIONATED): Heparin Unfractionated: 0.54 IU/mL (ref 0.30–0.70)

## 2017-12-08 MED ORDER — SODIUM CHLORIDE 0.9 % IV SOLN
2.0000 g | Freq: Three times a day (TID) | INTRAVENOUS | Status: DC
  Administered 2017-12-08 – 2017-12-10 (×6): 2 g via INTRAVENOUS
  Filled 2017-12-08 (×7): qty 2

## 2017-12-08 MED ORDER — SODIUM CHLORIDE 0.9 % IV BOLUS
1000.0000 mL | Freq: Once | INTRAVENOUS | Status: AC
  Administered 2017-12-08: 1000 mL via INTRAVENOUS

## 2017-12-08 MED ORDER — IBUPROFEN 100 MG/5ML PO SUSP
400.0000 mg | Freq: Four times a day (QID) | ORAL | Status: DC | PRN
  Administered 2017-12-08: 400 mg via ORAL
  Filled 2017-12-08: qty 20

## 2017-12-08 MED ORDER — NOREPINEPHRINE 4 MG/250ML-% IV SOLN
0.0000 ug/min | INTRAVENOUS | Status: DC
  Administered 2017-12-08: 5 ug/min via INTRAVENOUS
  Administered 2017-12-09: 25 ug/min via INTRAVENOUS
  Administered 2017-12-09: 20 ug/min via INTRAVENOUS
  Administered 2017-12-09 (×2): 35 ug/min via INTRAVENOUS
  Administered 2017-12-09: 40 ug/min via INTRAVENOUS
  Administered 2017-12-10: 10 ug/min via INTRAVENOUS
  Administered 2017-12-10: 30 ug/min via INTRAVENOUS
  Administered 2017-12-10: 6 ug/min via INTRAVENOUS
  Administered 2017-12-10: 25 ug/min via INTRAVENOUS
  Administered 2017-12-10: 30 ug/min via INTRAVENOUS
  Filled 2017-12-08 (×12): qty 250

## 2017-12-08 MED ORDER — SODIUM CHLORIDE 0.9 % IV BOLUS: 1000.0000 mL | Freq: Once | INTRAVENOUS | Status: DC

## 2017-12-08 MED ORDER — POTASSIUM CHLORIDE 20 MEQ PO PACK
20.0000 meq | PACK | Freq: Every day | ORAL | Status: DC
  Filled 2017-12-08 (×2): qty 1

## 2017-12-08 MED ORDER — POTASSIUM CHLORIDE IN NACL 20-0.9 MEQ/L-% IV SOLN
INTRAVENOUS | Status: DC
  Administered 2017-12-08 – 2017-12-12 (×6): via INTRAVENOUS
  Administered 2017-12-12 – 2017-12-13 (×2): 1000 mL via INTRAVENOUS
  Administered 2017-12-13: 21:00:00 via INTRAVENOUS
  Filled 2017-12-08 (×10): qty 1000

## 2017-12-08 MED ORDER — POTASSIUM CHLORIDE 20 MEQ PO PACK
40.0000 meq | PACK | Freq: Once | ORAL | Status: AC
  Administered 2017-12-08: 40 meq via ORAL
  Filled 2017-12-08: qty 2

## 2017-12-08 MED ORDER — POTASSIUM CHLORIDE 10 MEQ/100ML IV SOLN
10.0000 meq | INTRAVENOUS | Status: AC
  Administered 2017-12-08 (×2): 10 meq via INTRAVENOUS
  Filled 2017-12-08 (×2): qty 100

## 2017-12-08 NOTE — Progress Notes (Signed)
Inpatient Diabetes Program Recommendations  AACE/ADA: New Consensus Statement on Inpatient Glycemic Control (2015)  Target Ranges:  Prepandial:   less than 140 mg/dL      Peak postprandial:   less than 180 mg/dL (1-2 hours)      Critically ill patients:  140 - 180 mg/dL   Lab Results  Component Value Date   GLUCAP 159 (H) 12/08/2017   HGBA1C 9.1 (H) 11/19/2017    Review of Glycemic Control Results for [name], [name] (MRN [id number]) as of 12/08/2017 14:25  Ref. Range 12/08/2017 09:35 12/08/2017 10:27 12/08/2017 11:36 12/08/2017 13:00 12/08/2017 14:05  Glucose-Capillary Latest Ref Range: 70 - 99 mg/dL 118 (H)  5.4 units/hr 165 (H)  9.8 units/hr 182 (H)  12.6 units/hr 162 (H)  10.5 units/hr 159 (H)  10.2 units/hr  Diabetes history: DM 2 Outpatient Diabetes medications: Victoza 1.2 mg Daily, Metformin 1000 mg BID Current orders for Inpatient glycemic control:  IV insulin  Inpatient Diabetes Program Recommendations:   Tube feeds on hold.  Insulin drip rates remain very high however he has been on insulin drip for >48 hours.  Not a candidate to be transitioned per protocol.  Current insulin needs are over 200 units/24 hours therefore IV insulin is likely the safest route at this time.   Thanks   Adah Perl, RN, BC-ADM Inpatient Diabetes Coordinator Pager [phone] (8a-5p)

## 2017-12-08 NOTE — Progress Notes (Signed)
eLink Physician-Brief Progress Note Patient Name: Kenyetta Fife DOB: 10-Jul-1955 MRN: [id number]   Date of Service  12/08/2017  HPI/Events of Note  Blood pressure still low after getting 2 liters fluid.   eICU Interventions  Will add levophed.        Bryanda Mikel 12/08/2017, 10:38 PM

## 2017-12-08 NOTE — Progress Notes (Signed)
SLP Cancellation Note  Patient Details Name: [name] [name] MRN: [id number] DOB: 1955/11/19   Cancelled treatment:       Reason Eval/Treat Not Completed: Patient's level of consciousness. Checked in with pt and daughter, he is well sedated at this time, was more alert this am. Will check in tomorrow in late am or early pm for needs after procedure.    Shelley Pooley, Katherene Ponto 12/08/2017, 3:00 PM

## 2017-12-08 NOTE — Progress Notes (Signed)
PULMONARY / CRITICAL CARE MEDICINE   Name: [name] [name] MRN: [id number] DOB: May 15, 1956    ADMISSION DATE:  11/18/2017 CONSULTATION DATE: 11/18/2017  REFERRING MD: Emergency department  CHIEF COMPLAINT: Stroke  HISTORY OF PRESENT ILLNESS:    [name] [name]  is a 62 year old man with atrial fibrillation, formally on anticoagulation was stopped due to recent hemoptysis.  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  He presented with an acute stroke 6/27, was intubated and potential tracheal erosion site covered with cuff balloon to facilitate TPA.  Unfortunately during the hospitalization is also experienced a second stroke and underwent mechanical thrombectomy, stent placement 7/1.  SUBJECTIVE:  He continues to be intubated and mechanically ventilated.  Klonopin was discontinued on 7/15 and he is still sedated on fentanyl and Precedex this morning.  He is having some spontaneous eye opening but is not interacting.      VITAL SIGNS: BP 126/74   Pulse (!) 102   Temp 98.9 F (37.2 C) (Axillary)   Resp (!) 22   Ht 5\' 10"  (1.778 m)   Wt 281 lb 12 oz (127.8 kg)   SpO2 99%   BMI 40.43 kg/m   HEMODYNAMICS:    VENTILATOR SETTINGS: Vent Mode: PSV;CPAP FiO2 (%):  [40 %-60 %] 40 % Set Rate:  [20 bmp] 20 bmp PEEP:  [8 cmH20] 8 cmH20 Pressure Support:  [10 cmH20] 10 cmH20 Plateau Pressure:  [17 cmH20-24 cmH20] 17 cmH20  INTAKE / OUTPUT: I/O last 3 completed shifts: In: 5282.2 [I.V.:2568.8; NG/GT:2520; IV Piggyback:193.4] Out: 2119 [Urine:4165]  PHYSICAL EXAMINATION: General: Middle-aged obese male who is orally intubated and mechanically ventilated.  He is in no distress.   HEENT: [place]/AT, PERRL, EOM-I and MMM Neuro: He has spontaneous eye opening this morning but does not interact.  He does not track or blink to questions.  Pupils are equal.  There is no limb movement to noxious stimuli.  CV: S1 and S2 are irregularly irregular without  murmur rub or gallop  PULM: He is unlabored this morning on a pressure support of 10.  There is symmetric air movement, few scattered rhonchi and no wheezes.  There is no blood in the endotracheal tube    GI: The abdomen is obese soft nontender without any organomegaly.   Extremities: Skin: No rash  Recent Labs  Lab 12/06/17 0228 12/07/17 0239 12/08/17 0508  NA 138 138 140  K 3.5 3.2* 3.1*  CL 89* 95* 99  CO2 35* 33* 32  BUN 49* 47* 46*  CREATININE 0.81 0.70 0.70  GLUCOSE 230* 151* 169*   Electrolytes Recent Labs  Lab 12/02/17 0219  12/04/17 0500 12/05/17 0500 12/06/17 0228 12/07/17 0239 12/08/17 0508  CALCIUM 9.7   < > 10.2 10.5* 10.7* 9.9 9.4  MG  --    < > 1.8 2.0 2.2  --   --   PHOS 4.2  --   --  5.0* 4.2  --   --    < > = values in this interval not displayed.   CBC Recent Labs  Lab 12/06/17 0228 12/07/17 0239 12/08/17 0508  WBC 11.7* 10.7* 12.6*  HGB 12.4* 11.4* 10.6*  HCT 40.1 36.7* 35.3*  PLT 502* 420* 376   Coag's No results for input(s): APTT, INR in the last 168 hours.  Sepsis Markers No results for input(s): LATICACIDVEN, PROCALCITON, O2SATVEN in the last 168 hours.  ABG Recent Labs  Lab 12/05/17 0502 12/06/17 0335  PHART 7.448 7.425  PCO2ART  58.0* 58.2*  PO2ART 98.0 228*   Liver Enzymes Recent Labs  Lab 12/02/17 0219  ALBUMIN 2.3*   Cardiac Enzymes No results for input(s): TROPONINI, PROBNP in the last 168 hours.  Glucose Recent Labs  Lab 12/08/17 0302 12/08/17 0358 12/08/17 0503 12/08/17 0615 12/08/17 0722 12/08/17 0818  GLUCAP 166* 192* 181* 185* 152* 150*   Imaging No results found. 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  He also has a thyroid cancer which is encasing the trachea, which is likely invaded the trachea and was responsible for hemoptysis prior to admission.  We have attempted to determine the patient's desires as to the appropriate level of care, and unfortunately he is not sufficiently or consistently interactive enough to make his own wishes known in this setting.  ASSESSMENT / PLAN:  PULMONARY A: Vent dependent respiratory failure Possible airway compromise due to paratracheal/thyroid mass Pulmonary nodules, question metastatic Right upper lobe infiltrate at presentation, presumed pneumonia, culture negative COPD He continues to require an artificial airway to maintain a safe air passage.  He is requiring minimal ventilatory support.  Family has asked that we proceed with tracheostomy primarily as a comfort measure until they can reach a consensus as to the appropriate course of long-term care.  I have made them aware that we are holding his Brilinta for less than the usual preoperative period,  And that the risk of Bleeding with the procedure will be significant, in part because of drug-induced platelet dysfunction, and in part because there is potentially a good deal of very vascular tissue encasing the trachea.  They are aware that we have not held Brilinta for the usual 5 days out of concern that he will occlude his newly placed his lower artery stent.  CARDIOVASCULAR A:  History of  Paroxysmal atrial fibrillation, now on full dose heparin.     Coronary artery disease.   Peripheral vascular disease Metoprolol as needed Continue Crestor, carvedilol Continue heparin drip Lasix at same dose as outlined above Brilinta has been held today in anticipation of tracheostomy on 7/18    RENAL  A:   No acute renal  issues   Thyroid Mass with possible associated airway compromise Diagnosis of papillary carcinoma established by biopsy during this admission.  GASTROINTESTINAL A:   History of GERD Nutrition P:   PPI as ordered Tube feeding at goal Dulcolax and colace   HEMATOLOGIC A:   Chronic anticoagulation for atrial fibrillation Off anticoagulation for 3 weeks due to hemoptysis Follow CBC on aspirin, Brilinta, heparin drip  INFECTIOUS A:   Observed aspiration, right upper lobe pneumonia P:   Following off antibiotics,   ENDOCRINE Diabetes mellitus - ongoing hyperglycemia  P:   CBG q 4 SSI - resistant Lantus 45 units HS Increased novolog scheduled q 4 from 6 to 8 units on 7/10  PAD Patient had a cool right foot which resolved with removing his right femoral sheath.    NEUROLOGIC A:   Multiple strokes, initially received TPA and then underwent basilar artery thrombectomy and stent placement Appreciate neurology management.  Poor prognosis for neurological recovery Brilinta, aspirin  Lars Masson, MD. Critical Care Medicine. Pager: [phone]. After hours pager: [phone].  12/08/2017, 9:31 AM

## 2017-12-08 NOTE — Progress Notes (Signed)
[facility] for Heparin Indication: atrial fibrillation  No Known Allergies  Patient Measurements: Height: 5' 10"  (177.8 cm) Weight: 281 lb 12 oz (127.8 kg) IBW/kg (Calculated) : 73 Heparin Dosing Weight: 109.5  Vital Signs: Temp: 98.9 F (37.2 C) (07/17 0812) Temp Source: Axillary (07/17 0812) BP: 126/74 (07/17 0835) Pulse Rate: 102 (07/17 0835)  Labs: Recent Labs    12/06/17 0228  12/07/17 0239 12/07/17 0950 12/08/17 0508  HGB 12.4*  --  11.4*  --  10.6*  HCT 40.1  --  36.7*  --  35.3*  PLT 502*  --  420*  --  376  HEPARINUNFRC 0.82*   < > 0.60 0.53 0.54  CREATININE 0.81  --  0.70  --  0.70   < > = values in this interval not displayed.   Estimated Creatinine Clearance: 130.2 mL/min (by C-G formula based on SCr of 0.7 mg/dL).  Medical History: Past Medical History:  Diagnosis Date  . Collagen vascular disease ([place])   . Coronary artery disease   . GERD (gastroesophageal reflux disease)   . High cholesterol   . Hypertension   . OSA (obstructive sleep apnea)    "dx'd years ago; never have worn mask; mask never ordered" (04/01/2017)  . Pneumonia 2011  . Type II diabetes mellitus ([place]) 07/2016   Medications:  Scheduled:  . carvedilol  6.25 mg Oral BID WC  . chlorhexidine gluconate (MEDLINE KIT)  15 mL Mouth Rinse BID  . Chlorhexidine Gluconate Cloth  6 each Topical Q0600  . docusate  100 mg Oral BID  . feeding supplement (PRO-STAT SUGAR FREE 64)  30 mL Per Tube TID  . ipratropium-albuterol  3 mL Nebulization TID  . mouth rinse  15 mL Mouth Rinse 10 times per day  . pantoprazole sodium  40 mg Per Tube Daily  . potassium chloride  20 mEq Per Tube Daily  . rosuvastatin  20 mg Per Tube q1800  . sodium chloride flush  10-40 mL Intracatheter Q12H   Assessment: 62 YO male admitted on 6/27 for stroke and was found to have a mass on his thyroid causing hemoptysis. Biopsy results have identified metastatic papillary thyroid  carcinoma. On Eliquis for afib PTA, but stopped 3 wks before admission 2/2 to bleed. Now on heparin. Last heparin level was therapeutic at 0.54. Hgb trending down slowly to 10.6, plts down to 376.  Goal of Therapy: Heparin level 0.3-0.5 units/ml Monitor platelets by anticoagulation protocol: Yes   Plan:  Decrease heparin gtt slightly to 2,200 units/hr Monitor daily heparin level, CBC, s/s of bleed  Thanks for allowing pharmacy to be a part of this patient's care.  Elenor Quinones, PharmD, BCPS Clinical Pharmacist Phone number [phone] 12/08/2017 10:38 AM

## 2017-12-08 NOTE — Progress Notes (Signed)
Paged CCM about patients BP being 84/53 map 63. Dr Halford Chessman returned patient and was also notified of patients temperature. Patients sedation was lowered and patient remained responsive with no neuro changes. Dr Halford Chessman ordered a 1L bolus of NS and a chest xray. Darla night shift RN was also updated on patients status. Will continue to monitor.

## 2017-12-08 NOTE — Progress Notes (Signed)
Patient's temp reached 102.5 rectally. While turing patient to place him on the cooling blanket he had a large bowel movement. Patient became tachypneic, tachycardiac and aggitated. Patient was suctioned and sat back up right. Patient's heart rate was in the 150s and patients SP02 dropped into the 70s. Patient received 5mg  of IV lopressor and patient was suctioned and respiratory was notified. Patients SP02 remained in the 80s and RRT suctioned 2x and received a copious thick amount of secretions. Patient was placed on 100% Fi02 at that time and remained at 100%. Patients heart rate was decreased to the low 95-105. Patient remained responsive and his respirations were lowered and BP remained WNL's. Will continue to monitor patient at this time.

## 2017-12-08 NOTE — Progress Notes (Signed)
Palliative:  I came to bedside and daughter, [name], at bedside alone. I reintroduced myself to her and allowed her time to speak. She shared with me that she is happy to be moving forward with trach tomorrow. She also commented that this process has been prolonged (unecessarily in her opinion) after he had made the decision for trach on Friday. She shares that she is hopeful that he will be able to progress with communication once trach placed. I encouraged her to take one day at a time.   She was also asking about legal decision making. We discussed legal surrogate decision making in the absence of a documented HCPOA is spouse and then to 77 of children. I encouraged her that our goal is to work with all the important people in his life to cohesively make good decisions for her father. She also asked about the process of obtaining POA for medical decisions as well as finances and I told her that he is unable to make these decisions so this would become a legal matter. All questions/concerns addressed to the best of my ability. Emotional support provided.   Exam: Somnolent/sedated, on vent, no distress, did not arouse to voice.  25 min  Vinie Sill, NP Palliative Medicine Team Pager # [phone] (M-F 8a-5p) Team Phone # [phone] (Nights/Weekends)

## 2017-12-08 NOTE — Progress Notes (Signed)
Late entry-  Pt was placed back on full vent support d/t increased WOB (already increased PS), and HR variable up to 140's.  RN in room and aware.

## 2017-12-08 NOTE — Progress Notes (Signed)
CCM paged regarding pts increasing temp despite nursing interventions. Tylenol administered twice, ice packs applied, all blankets and SCDs off. An order for cooling blanket already present; however there are none available within the hospital. New order for ibuprofen.

## 2017-12-08 NOTE — Progress Notes (Signed)
eLink Physician-Brief Progress Note Patient Name: [name] [name] DOB: 1956/05/16 MRN: [id number]   Date of Service  12/08/2017  HPI/Events of Note  Hypotensive, febrile.  eICU Interventions  Give 1 liter fluid, add ABx.        Cincere Deprey 12/08/2017, 7:29 PM

## 2017-12-08 NOTE — Progress Notes (Addendum)
RN called due to sat decrease to 72%. Heart rate was increased and patient was agitated with increase work of breathing. Nurse reports patient with a fever. Patient had bilateral breath sounds with rhonchi. Patient was suctioned two times and both time received thick, copious secretions. Patient was placed on 100%. Sat has increased to 98%. Will continue to monitor. Patient appears more calm, vitals are stable, and RN at bedside.

## 2017-12-08 NOTE — Progress Notes (Signed)
STROKE TEAM PROGRESS NOTE   SUBJECTIVE (INTERVAL HISTORY) His daughter is at the bedside. Pt more awake alert today, eyes open and follow limited commands. However, still gag and desynchronized with vent    OBJECTIVE Temp:  [98.5 F (36.9 C)-102.2 F (39 C)] 102.2 F (39 C) (07/17 1605) Pulse Rate:  [99-147] 132 (07/17 1550) Cardiac Rhythm: Atrial fibrillation (07/17 0800) Resp:  [21-28] 25 (07/17 1550) BP: (95-145)/(55-93) 134/81 (07/17 1550) SpO2:  [87 %-99 %] 96 % (07/17 1550) FiO2 (%):  [40 %-60 %] 40 % (07/17 1550) Weight:  [281 lb 12 oz (127.8 kg)] 281 lb 12 oz (127.8 kg) (07/17 0415)  CBC:  Recent Labs  Lab 12/07/17 0239 12/08/17 0508  WBC 10.7* 12.6*  NEUTROABS 7.4 10.1*  HGB 11.4* 10.6*  HCT 36.7* 35.3*  MCV 88.2 90.3  PLT 420* 063    Basic Metabolic Panel:  Recent Labs  Lab 12/05/17 0500 12/06/17 0228 12/07/17 0239 12/08/17 0508  NA 135 138 138 140  K 4.0 3.5 3.2* 3.1*  CL 88* 89* 95* 99  CO2 36* 35* 33* 32  GLUCOSE 234* 230* 151* 169*  BUN 34* 49* 47* 46*  CREATININE 0.97 0.81 0.70 0.70  CALCIUM 10.5* 10.7* 9.9 9.4  MG 2.0 2.2  --   --   PHOS 5.0* 4.2  --   --     Lipid Panel:     Component Value Date/Time   CHOL 110 11/19/2017 0437   CHOL 119 08/09/2017 1000   TRIG 340 (H) 11/19/2017 0437   HDL 19 (L) 11/19/2017 0437   HDL 29 (L) 08/09/2017 1000   CHOLHDL 5.8 11/19/2017 0437   VLDL 68 (H) 11/19/2017 0437   LDLCALC 23 11/19/2017 0437   LDLCALC 30 08/09/2017 1000   HgbA1c:  Lab Results  Component Value Date   HGBA1C 9.1 (H) 11/19/2017   Urine Drug Screen: No results found for: LABOPIA, COCAINSCRNUR, LABBENZ, AMPHETMU, THCU, LABBARB  Alcohol Level No results found for: [place] I have personally reviewed the radiological images below and agree with the radiology interpretations.  Ct Angio Head W Or Wo Contrast  Ct Angio Neck W Or Wo Contrast 11/18/2017 Since the study of earlier today, there are only 2 changes. There is new  consolidation and volume loss in the right upper lobe. 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 Flow is seen distal to that however, presumably indicating patent posterior communicating arteries.   Ct Angio Head W Or Wo Contrast Ct Angio Neck W Or Wo Contrast 11/18/2017 1. Severe intracranial and extracranial atherosclerotic disease. No emergent large vessel occlusion  2. Severe stenosis right carotid bifurcation due to calcified plaque, estimated 90% diameter stenosis. No significant left carotid stenosis. Severe stenosis cavernous carotid bilaterally.  3. Both vertebral arteries are patent in the neck with severe calcific stenosis V4 segment bilaterally.  4. 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 Small calcified lymph nodes left neck suspicious for metastatic disease  5. Small lung nodules on the right, possible metastatic disease. Recommend chest CT for further evaluation.   Ct Head Wo Contrast 11/19/2017 1. Expected evolution of brainstem infarct without hemorrhage.  2. No new infarcts.  3. Left vertebral artery and basilar artery stent.  4. Acute on chronic sinus disease may be related to intubation.   Ct Chest W Contrast 11/18/2017 Right upper lobe airspace opacity is noted with air bronchograms concerning for pneumonia or possibly atelectasis. Stable mediastinal adenopathy is noted compared  to prior exam. Endotracheal and nasogastric tubes are in grossly good position. Coronary artery calcifications are noted suggesting coronary artery disease. Possible 2.8 cm left thyroid nodule. Thyroid ultrasound is recommended for further evaluation. Aortic Atherosclerosis (ICD10-I70.0).   Mr Brain Wo Contrast 11/19/2017 1. 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 No associated hemorrhage or significant mass effect.  2. Additional punctate acute/early subacute infarctions within  left lateral cerebellum and right parietal cortex.  3. Minimal chronic microvascular ischemic changes of the brain and mild parenchymal volume loss.   Ct Head Code Stroke Wo Contrast 11/18/2017 1. No acute intracranial abnormality.  Atherosclerotic calcification  2. ASPECTS is 10   Cerebral Angiogram / Stent  11/18/2017 1. 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 Occluded non dominant RT VBJ. 2. S/P endovascular revascularization of the distal basilar artery  To 90 % patency , and of 30 to 40 % of the LT VBJ with stent placement. Dominant  Lt Pcom.  Transthoracic Echocardiogram - Left ventricle: The cavity size was moderately dilated. Wall thickness was increased in a pattern of severe LVH. Systolic function was normal. The estimated ejection fraction was in the range of 55% to 60%. Wall motion was normal; there were no regional wall motion abnormalities. The study is not technically sufficient to allow evaluation of LV diastolic function. - Aortic valve: Trileaflet. Sclerosis without stenosis. There was no regurgitation. Valve area (Vmax): 2.88 cm^2. - Aorta: Aortic root ML diameter: 41.95 mm (ED). - Aortic root: The aortic root is mildly dilated. - Mitral valve: Calcified annulus. There was trivial regurgitation. - Left atrium: The atrium was mildly dilated. - Right ventricle: The cavity size was mildly dilated. Mildly reduced systolic function. - Inferior vena cava: The vessel was normal in size. The respirophasic diameter changes were in the normal range (>= 50%), consistent with normal central venous pressure. Impressions:  Compared to a prior study in 02/2017, there have been no significant changes.  Ct Head Wo Contrast 11/24/2017 1. Continued normal expected interval evolution of brainstem infarct. No evidence for hemorrhagic transformation or other complication.  2. No other new intracranial abnormality.  3. Moderate  paranasal sinus disease with bilateral mastoid effusions, likely related intubation.   Dg Chest Port 1 View 11/25/2017 Mild vascular congestion. Tubes and lines as described.   Ct Soft Tissue Neck W Contrast 11/26/2017 Aside from the fact that the patient shows endotracheal intubation and the presence of an orogastric tube, and aside from development of bilateral layering pleural effusions with dependent atelectasis, the findings are unchanged since the previous studies. Irregular mass arising from the inferior left thyroid lobe, measuring approximately 5 x 3.4 x 5.5 cm, with invasion of the surrounding tissues worrisome for thyroid carcinoma. Partial encasement of the trachea. Broad surface along the esophagus. Small but likely pathologic nodes in the lower neck and superior mediastinum, some with calcification, probably involved by metastatic disease.   Dg Chest Port 1 View 11/29/2017 Tube and catheter positions as described without pneumothorax. There is pulmonary vascular congestion with interstitial edema and small pleural effusions. Suspect a degree of underlying congestive heart failure. No airspace consolidation.    Lymph node, needle/core biopsy - METASTATIC PAPILLARY THYROID CARCINOMA.   PHYSICAL EXAM General - morbid obesity,middle aged Caucasian male, intubated on low dose sedation.  Ophthalmologic - fundi not visualized due to noncooperation.  Cardiovascular -  irregularly irregular heart rate and rhythm, afib RVR.  Neuro - intubated on low dose sedation, eyes open, following commands with eye movement, not blinking to visual threat bilaterally, PERRL. Weak corneal bilaterally, R>L. Positive gag. Facial symmetry not able to test due to ET tube, not able to move tongue or mouth on command. No movement on all extremities even with pain stimulation, DTR diminished and no babinski. Sensation, coordination and gait not tested.   ASSESSMENT/PLAN [name] is a 62 y.o. male with  PMH of DM, hemoptysis, OSA, HTN, HLD, CAD, afib off Eliquis due to hemoptysis presenting with slurred speech, right sided weakness. Was intubated for hemoptysis prevention while giving tPA. Symptoms worsened and imaging confirmed BA occlusion. S/p IR with revascularization of BA as well as left VA s/p stenting.  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   MRI head - large acute/early subacute infarction within the central pons involving both sides, greater on the right.  CTA H&N - severe vascular athero including BA stenosis, b/l VA stenosis, right ICA proximal 90% stenosis, b/l cavernous ICA stenosis R>L.  Repeat CTA H/N - new distal BA occlusion  DSA - revascularization of the distal basilar artery to 90 % patency , and of 30 to 40 % of the LT VBJ with stent placement.  CT head 11/24/17 showed normal evolution of brainstem infarct, no acute change  2D Echo - EF 55-60%  LDL - 23  HgbA1c - 9.1  VTE prophylaxis - heparin subq  Eliquis (recently stopped) and plavix prior to admission, now on IV heparin drip.   Ongoing aggressive stroke risk factor management  Therapy recommendations:  pending  Disposition:  Pending  Palliative care on board  METASTATIC PAPILLARY THYROID CARCINOMA  CTA neck - right large thyroid mass compressing on trachea.   Thyroid mass larger than CT chest in 08/2016  Evidence of tracheal erosion on bronch at this admission  CT neck with contrast showed thyroid carcinoma, encasement of trachea.    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   oncology Dr Learta Codding on  board  Respiratory failure  CCM on board  Current intubated on sedation  Intermittent fentanyl for vent desynchronization  no cuff leak  Plan for trach and PEG tomorrow  PAF  Currently in afib RVR with respiratory distress  On eliquis prior but off due to hemoptysis  On heparin IV now  Continue brilinta due to VA stent  Coreg bid for rate control  Cerebral vascular athero  CTA head and neck showed right ICA proximal 90% stenosis, left ICA proximal athero, b/l cavernous ICA severe athro R>L, BA severe stenosis, b/l V4 severe athero with right V4 occlusion  S/p left V4 stent and BA revascularization  On brilinta and heparin IV  Hemoptysis, improved  Since 08/2016 as per family  Not sure about etiology  TB ruled out in the past  Likely tracheal erosion from right thyroid mass   CT neck showed thyroid carcinoma, encasement of trachea.   CCM on board  Consider tertiary center transfer for mass debulking or tracheal resection  Fever  101.8->102.2  Off antibiotics  12/05/17 CXR - Improved aeration  in the lung bases.  Hypertension  Stable  On coreg bid  Also on lasix Q8h for fluid overload . Long term BP goal normotensive.  Hyperlipidemia  Lipid lowering medication PTA: Crestor 10 mg daily  LDL 23, goal < 70  On Crestor  Continue statin at discharge  Diabetes  HgbA1c 9.1, goal < 7.0  Uncontrolled  continued on insulin drip  Continued hyperglycemia  CCM on board   CBG monitoring  Other Stroke Risk Factors  Advanced age  Former cigarette smoker - quit, 40 pack year history  Morbid obesity, Body mass index is 40.43 kg/m.    Hx stroke/TIA  Coronary artery disease   PAD with claudication  OSA on CPAP  Other Active Problems  Leukocytosis, resolved WBC 9.3->11.4->9.3->10.0->11.7->10.7->12.6  Palliative care on board  This patient is critically ill and at significant risk of neurological worsening, death and care requires  constant monitoring of vital signs, hemodynamics,respiratory and cardiac monitoring, extensive review of multiple databases, frequent neurological assessment, discussion with family, other specialists and medical decision making of high complexity. I spent 35 minutes of neurocritical care time  in the care of  this patient. I had long discussion with girlfriend at bedside, updated pt current condition, treatment plan and potential prognosis. I also answered question of his son over the phone. They expressed understanding and appreciation. I also discussed with Dr. Dominica Severin in the hallway.    Rosalin Hawking, MD PhD Stroke Neurology 12/08/2017 4:23 PM   To contact Stroke Continuity provider, please refer to [email or web address] After hours, contact General Neurology

## 2017-12-09 ENCOUNTER — Inpatient Hospital Stay (HOSPITAL_COMMUNITY): Payer: 59

## 2017-12-09 ENCOUNTER — Inpatient Hospital Stay (HOSPITAL_COMMUNITY): Payer: 59 | Admitting: Certified Registered Nurse Anesthetist

## 2017-12-09 ENCOUNTER — Encounter (HOSPITAL_COMMUNITY)
Admission: EM | Disposition: A | Discharge status: Nursing Home (Includes ICF) | Payer: Self-pay | Source: Home / Self Care | Attending: Neurology

## 2017-12-09 DIAGNOSIS — D72829 Elevated white blood cell count, unspecified: Secondary | ICD-10-CM | POA: Diagnosis not present

## 2017-12-09 DIAGNOSIS — Z430 Encounter for attention to tracheostomy: Secondary | ICD-10-CM

## 2017-12-09 HISTORY — PX: TRACHEOSTOMY TUBE PLACEMENT: SHX814

## 2017-12-09 LAB — RENAL FUNCTION PANEL
ANION GAP: 8 (ref 5–15)
Albumin: 2.2 g/dL — ABNORMAL LOW (ref 3.5–5.0)
BUN: 21 mg/dL (ref 8–23)
CHLORIDE: 105 mmol/L (ref 98–111)
CO2: 27 mmol/L (ref 22–32)
Calcium: 8.8 mg/dL — ABNORMAL LOW (ref 8.9–10.3)
Creatinine, Ser: 0.49 mg/dL — ABNORMAL LOW (ref 0.61–1.24)
GFR calc Af Amer: >60 mL/min (ref >60)
GFR calc non Af Amer: >60 mL/min (ref >60)
Glucose, Bld: 176 mg/dL — ABNORMAL HIGH (ref 70–99)
POTASSIUM: 3.8 mmol/L (ref 3.5–5.1)
Phosphorus: <1 mg/dL — CL (ref 2.5–4.6)
Sodium: 140 mmol/L (ref 135–145)

## 2017-12-09 LAB — GLUCOSE, CAPILLARY
GLUCOSE-CAPILLARY: 127 mg/dL — AB (ref 70–99)
GLUCOSE-CAPILLARY: 131 mg/dL — AB (ref 70–99)
GLUCOSE-CAPILLARY: 132 mg/dL — AB (ref 70–99)
GLUCOSE-CAPILLARY: 157 mg/dL — AB (ref 70–99)
GLUCOSE-CAPILLARY: 159 mg/dL — AB (ref 70–99)
GLUCOSE-CAPILLARY: 162 mg/dL — AB (ref 70–99)
GLUCOSE-CAPILLARY: 162 mg/dL — AB (ref 70–99)
GLUCOSE-CAPILLARY: 162 mg/dL — AB (ref 70–99)
GLUCOSE-CAPILLARY: 162 mg/dL — AB (ref 70–99)
GLUCOSE-CAPILLARY: 167 mg/dL — AB (ref 70–99)
GLUCOSE-CAPILLARY: 209 mg/dL — AB (ref 70–99)
GLUCOSE-CAPILLARY: 258 mg/dL — AB (ref 70–99)
Glucose-Capillary: 114 mg/dL — ABNORMAL HIGH (ref 70–99)
Glucose-Capillary: 140 mg/dL — ABNORMAL HIGH (ref 70–99)
Glucose-Capillary: 142 mg/dL — ABNORMAL HIGH (ref 70–99)
Glucose-Capillary: 143 mg/dL — ABNORMAL HIGH (ref 70–99)
Glucose-Capillary: 152 mg/dL — ABNORMAL HIGH (ref 70–99)
Glucose-Capillary: 153 mg/dL — ABNORMAL HIGH (ref 70–99)
Glucose-Capillary: 167 mg/dL — ABNORMAL HIGH (ref 70–99)
Glucose-Capillary: 172 mg/dL — ABNORMAL HIGH (ref 70–99)
Glucose-Capillary: 184 mg/dL — ABNORMAL HIGH (ref 70–99)
Glucose-Capillary: 186 mg/dL — ABNORMAL HIGH (ref 70–99)
Glucose-Capillary: 188 mg/dL — ABNORMAL HIGH (ref 70–99)
Glucose-Capillary: 216 mg/dL — ABNORMAL HIGH (ref 70–99)
Glucose-Capillary: 229 mg/dL — ABNORMAL HIGH (ref 70–99)

## 2017-12-09 LAB — CBC WITH DIFFERENTIAL/PLATELET
Abs Immature Granulocytes: 0.1 10*3/uL (ref 0.0–0.1)
BASOS ABS: 0.1 10*3/uL (ref 0.0–0.1)
BASOS PCT: 0 %
EOS ABS: 0.1 10*3/uL (ref 0.0–0.7)
Eosinophils Relative: 1 %
HCT: 36.1 % — ABNORMAL LOW (ref 39.0–52.0)
Hemoglobin: 10.7 g/dL — ABNORMAL LOW (ref 13.0–17.0)
IMMATURE GRANULOCYTES: 1 %
LYMPHS ABS: 1.9 10*3/uL (ref 0.7–4.0)
Lymphocytes Relative: 12 %
MCH: 26.6 pg (ref 26.0–34.0)
MCHC: 29.6 g/dL — ABNORMAL LOW (ref 30.0–36.0)
MCV: 89.6 fL (ref 78.0–100.0)
Monocytes Absolute: 0.9 10*3/uL (ref 0.1–1.0)
Monocytes Relative: 6 %
NEUTROS PCT: 80 %
Neutro Abs: 12.8 10*3/uL — ABNORMAL HIGH (ref 1.7–7.7)
PLATELETS: 441 10*3/uL — AB (ref 150–400)
RBC: 4.03 MIL/uL — AB (ref 4.22–5.81)
RDW: 14 % (ref 11.5–15.5)
WBC: 15.9 10*3/uL — AB (ref 4.0–10.5)

## 2017-12-09 LAB — MAGNESIUM: Magnesium: 1.9 mg/dL (ref 1.7–2.4)

## 2017-12-09 LAB — BLOOD GAS, ARTERIAL
Acid-Base Excess: 5.3 mmol/L — ABNORMAL HIGH (ref 0.0–2.0)
BICARBONATE: 29.9 mmol/L — AB (ref 20.0–28.0)
DRAWN BY: 414221
FIO2: 40
O2 SAT: 97.3 %
PCO2 ART: 48.7 mmHg — AB (ref 32.0–48.0)
PEEP: 8 cmH2O
Patient temperature: 98.6
Pressure control: 18 cmH2O
RATE: 20 resp/min
pH, Arterial: 7.405 (ref 7.350–7.450)
pO2, Arterial: 88.8 mmHg (ref 83.0–108.0)

## 2017-12-09 LAB — BASIC METABOLIC PANEL
Anion gap: 8 (ref 5–15)
BUN: 39 mg/dL — AB (ref 8–23)
CALCIUM: 8.9 mg/dL (ref 8.9–10.3)
CO2: 30 mmol/L (ref 22–32)
Chloride: 104 mmol/L (ref 98–111)
Creatinine, Ser: 0.52 mg/dL — ABNORMAL LOW (ref 0.61–1.24)
GFR calc non Af Amer: >60 mL/min (ref >60)
GLUCOSE: 190 mg/dL — AB (ref 70–99)
POTASSIUM: 3 mmol/L — AB (ref 3.5–5.1)
SODIUM: 142 mmol/L (ref 135–145)

## 2017-12-09 LAB — CORTISOL: Cortisol, Plasma: 22.2 ug/dL

## 2017-12-09 LAB — HEMOGLOBIN AND HEMATOCRIT, BLOOD
HEMATOCRIT: 36.8 % — AB (ref 39.0–52.0)
HEMOGLOBIN: 11.2 g/dL — AB (ref 13.0–17.0)

## 2017-12-09 LAB — APTT: APTT: 33 s (ref 24–36)

## 2017-12-09 LAB — PROCALCITONIN: Procalcitonin: 0.12 ng/mL

## 2017-12-09 LAB — HEPARIN LEVEL (UNFRACTIONATED): Heparin Unfractionated: 0.45 IU/mL (ref 0.30–0.70)

## 2017-12-09 SURGERY — CREATION, TRACHEOSTOMY
Anesthesia: General

## 2017-12-09 MED ORDER — LIDOCAINE-EPINEPHRINE 1 %-1:100000 IJ SOLN
INTRAMUSCULAR | Status: DC | PRN
  Administered 2017-12-09: 4 mL

## 2017-12-09 MED ORDER — MIDAZOLAM HCL 2 MG/2ML IJ SOLN
INTRAMUSCULAR | Status: DC | PRN
  Administered 2017-12-09: 2 mg via INTRAVENOUS

## 2017-12-09 MED ORDER — LACTATED RINGERS IV SOLN
INTRAVENOUS | Status: DC | PRN
  Administered 2017-12-09: 08:00:00 via INTRAVENOUS

## 2017-12-09 MED ORDER — SODIUM CHLORIDE 0.9 % IV BOLUS
500.0000 mL | Freq: Once | INTRAVENOUS | Status: AC
  Administered 2017-12-09: 500 mL via INTRAVENOUS

## 2017-12-09 MED ORDER — EPHEDRINE SULFATE 50 MG/ML IJ SOLN
INTRAMUSCULAR | Status: AC
  Filled 2017-12-09: qty 1

## 2017-12-09 MED ORDER — HYDROCORTISONE NA SUCCINATE PF 100 MG IJ SOLR
50.0000 mg | Freq: Four times a day (QID) | INTRAMUSCULAR | Status: DC
  Administered 2017-12-09 – 2017-12-11 (×6): 50 mg via INTRAVENOUS
  Filled 2017-12-09 (×6): qty 2

## 2017-12-09 MED ORDER — FENTANYL CITRATE (PF) 250 MCG/5ML IJ SOLN
INTRAMUSCULAR | Status: DC | PRN
  Administered 2017-12-09: 50 ug via INTRAVENOUS

## 2017-12-09 MED ORDER — NOREPINEPHRINE 4 MG/250ML-% IV SOLN
0.0000 ug/min | INTRAVENOUS | Status: AC
  Administered 2017-12-09: 17 ug/kg/min via INTRAVENOUS
  Filled 2017-12-09 (×2): qty 250

## 2017-12-09 MED ORDER — ROCURONIUM BROMIDE 10 MG/ML (PF) SYRINGE
PREFILLED_SYRINGE | INTRAVENOUS | Status: AC
  Filled 2017-12-09: qty 10

## 2017-12-09 MED ORDER — PROPOFOL 10 MG/ML IV BOLUS
INTRAVENOUS | Status: AC
  Filled 2017-12-09: qty 20

## 2017-12-09 MED ORDER — 0.9 % SODIUM CHLORIDE (POUR BTL) OPTIME
TOPICAL | Status: DC | PRN
  Administered 2017-12-09: 1000 mL

## 2017-12-09 MED ORDER — POTASSIUM CHLORIDE 10 MEQ/50ML IV SOLN
10.0000 meq | INTRAVENOUS | Status: AC
  Administered 2017-12-09 (×5): 10 meq via INTRAVENOUS
  Filled 2017-12-09 (×5): qty 50

## 2017-12-09 MED ORDER — FENTANYL CITRATE (PF) 250 MCG/5ML IJ SOLN
INTRAMUSCULAR | Status: AC
  Filled 2017-12-09: qty 5

## 2017-12-09 MED ORDER — SODIUM CHLORIDE 0.9 % IJ SOLN
INTRAMUSCULAR | Status: AC
  Filled 2017-12-09: qty 10

## 2017-12-09 MED ORDER — SODIUM PHOSPHATES 45 MMOLE/15ML IV SOLN
30.0000 mmol | Freq: Once | INTRAVENOUS | Status: AC
  Administered 2017-12-09: 30 mmol via INTRAVENOUS
  Filled 2017-12-09: qty 10

## 2017-12-09 MED ORDER — ROCURONIUM BROMIDE 10 MG/ML (PF) SYRINGE
PREFILLED_SYRINGE | INTRAVENOUS | Status: DC | PRN
  Administered 2017-12-09: 50 mg via INTRAVENOUS
  Administered 2017-12-09: 30 mg via INTRAVENOUS

## 2017-12-09 MED ORDER — LIDOCAINE-EPINEPHRINE 1 %-1:100000 IJ SOLN
INTRAMUSCULAR | Status: AC
  Filled 2017-12-09: qty 1

## 2017-12-09 MED ORDER — TICAGRELOR 90 MG PO TABS
90.0000 mg | ORAL_TABLET | Freq: Two times a day (BID) | ORAL | Status: DC
  Administered 2017-12-09 – 2017-12-15 (×11): 90 mg via ORAL
  Filled 2017-12-09 (×13): qty 1

## 2017-12-09 MED ORDER — MIDAZOLAM HCL 2 MG/2ML IJ SOLN
INTRAMUSCULAR | Status: AC
  Filled 2017-12-09: qty 2

## 2017-12-09 SURGICAL SUPPLY — 47 items
BLADE CLIPPER SURG (BLADE) IMPLANT
BLADE SURG 15 STRL LF DISP TIS (BLADE) IMPLANT
BLADE SURG 15 STRL SS (BLADE)
CANISTER SUCT 3000ML PPV (MISCELLANEOUS) ×2 IMPLANT
CLEANER TIP ELECTROSURG 2X2 (MISCELLANEOUS) ×2 IMPLANT
CLIP VESOCCLUDE SM WIDE 6/CT (CLIP) ×1 IMPLANT
CONT SPEC 4OZ CLIKSEAL STRL BL (MISCELLANEOUS) ×1 IMPLANT
COVER SURGICAL LIGHT HANDLE (MISCELLANEOUS) ×2 IMPLANT
CRADLE DONUT ADULT HEAD (MISCELLANEOUS) IMPLANT
DECANTER SPIKE VIAL GLASS SM (MISCELLANEOUS) ×2 IMPLANT
DRAPE HALF SHEET 40X57 (DRAPES) IMPLANT
DRESSING ADAPTIC 1/2  N-ADH (PACKING) IMPLANT
ELECT COATED BLADE 2.86 ST (ELECTRODE) ×2 IMPLANT
ELECT REM PT RETURN 9FT ADLT (ELECTROSURGICAL) ×2
ELECTRODE REM PT RTRN 9FT ADLT (ELECTROSURGICAL) ×1 IMPLANT
GAUZE PETROLATUM 1 X8 (GAUZE/BANDAGES/DRESSINGS) IMPLANT
GAUZE SPONGE 4X4 16PLY XRAY LF (GAUZE/BANDAGES/DRESSINGS) ×2 IMPLANT
GAUZE VASELINE FOILPK 1/2 X 72 (GAUZE/BANDAGES/DRESSINGS) ×1 IMPLANT
GLOVE BIO SURGEON STRL SZ7.5 (GLOVE) ×2 IMPLANT
GLOVE BIOGEL PI IND STRL 8 (GLOVE) IMPLANT
GLOVE BIOGEL PI INDICATOR 8 (GLOVE) ×1
GLOVE ECLIPSE 8.0 STRL XLNG CF (GLOVE) ×1 IMPLANT
GOWN STRL REUS W/ TWL LRG LVL3 (GOWN DISPOSABLE) ×2 IMPLANT
GOWN STRL REUS W/TWL LRG LVL3 (GOWN DISPOSABLE) ×4
HOLDER TRACH TUBE VELCRO 19.5 (MISCELLANEOUS) ×2 IMPLANT
KIT BASIN OR (CUSTOM PROCEDURE TRAY) ×2 IMPLANT
KIT SUCTION CATH 14FR (SUCTIONS) IMPLANT
KIT TURNOVER KIT B (KITS) ×2 IMPLANT
NDL HYPO 25GX1X1/2 BEV (NEEDLE) ×1 IMPLANT
NEEDLE HYPO 25GX1X1/2 BEV (NEEDLE) ×2 IMPLANT
NS IRRIG 1000ML POUR BTL (IV SOLUTION) ×2 IMPLANT
PACK EENT II TURBAN DRAPE (CUSTOM PROCEDURE TRAY) ×2 IMPLANT
PAD ARMBOARD 7.5X6 YLW CONV (MISCELLANEOUS) ×4 IMPLANT
PENCIL BUTTON HOLSTER BLD 10FT (ELECTRODE) ×2 IMPLANT
SPLINT NASAL DOYLE BI-VL (GAUZE/BANDAGES/DRESSINGS) ×1 IMPLANT
SPONGE DRAIN TRACH 4X4 STRL 2S (GAUZE/BANDAGES/DRESSINGS) ×2 IMPLANT
SPONGE INTESTINAL PEANUT (DISPOSABLE) ×1 IMPLANT
SUT SILK 0 FSL (SUTURE) ×3 IMPLANT
SUT SILK 2 0 REEL (SUTURE) ×2 IMPLANT
SUT SILK 2 0 SH CR/8 (SUTURE) ×2 IMPLANT
SUT VIC AB 2-0 FS1 27 (SUTURE) IMPLANT
SYR 20ML ECCENTRIC (SYRINGE) ×2 IMPLANT
SYR BULB 3OZ (MISCELLANEOUS) ×2 IMPLANT
SYR CONTROL 10ML LL (SYRINGE) ×2 IMPLANT
TUBE CONNECTING 12X1/4 (SUCTIONS) ×2 IMPLANT
TUBE TRACH SHILEY 8 DIST CUF (TUBING) ×1 IMPLANT
WATER STERILE IRR 1000ML POUR (IV SOLUTION) ×2 IMPLANT

## 2017-12-09 NOTE — Progress Notes (Signed)
PULMONARY / CRITICAL CARE MEDICINE   Name: [name] [name] MRN: [id number] DOB: [birth year]    ADMISSION DATE:  11/18/2017 CONSULTATION DATE: 11/18/2017  REFERRING MD: Emergency department  CHIEF COMPLAINT: Stroke  HISTORY OF PRESENT ILLNESS:    [name] [name]  is a 62 year old man with atrial fibrillation, formally on anticoagulation was stopped due to recent hemoptysis.  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  He presented with an acute stroke 6/27, was intubated and potential tracheal erosion site covered with cuff balloon to facilitate TPA.  Unfortunately during the hospitalization is also experienced a second stroke and underwent mechanical thrombectomy, stent placement 7/1.  SUBJECTIVE: Tracheostomy was performed this morning.  There was no unusual intraoperative bleeding.  He was hypotensive last night and received 2 units of crystalloid and was empirically started on cefepime.  His last sputum culture on 7/15 grew a few [name] [name].  He is opening his eyes postoperatively and is moving his eyes as requested.  He required some levo fed for blood pressure support intraoperatively and his blood pressure remains marginal this morning.       VITAL SIGNS: BP (!) 81/54   Pulse 81   Temp 98 F (36.7 C) (Rectal)   Resp 20   Ht 5\' 10"  (1.778 m)   Wt (!) 304 lb 0.2 oz (137.9 kg)   SpO2 100%   BMI 43.62 kg/m   HEMODYNAMICS:    VENTILATOR SETTINGS: Vent Mode: PCV FiO2 (%):  [40 %-100 %] 40 % Set Rate:  [20 bmp] 20 bmp PEEP:  [8 cmH20] 8 cmH20 Plateau Pressure:  [20 cmH20-23 cmH20] 20 cmH20  INTAKE / OUTPUT: I/O last 3 completed shifts: In: 8251.5 [I.V.:3976.5; NG/GT:2039.7; IV Piggyback:2235.4] Out: 3340 [Urine:3340]  PHYSICAL EXAMINATION: General: Middle-aged obese male who is now ventilated via tracheostomy.     HEENT: [name] [name], PERRL, EOM-I and MMM Neuro: He has spontaneous eye opening and he is moving his eyes and the directions  requested.  He has no limb movement.   CV: S1 and S2 are irregularly irregular without murmur rub or gallop   PULM: Respirations are unlabored, there is symmetric air movement very few scattered rhonchi and no wheezes.  New tracheostomy site is dry.  His tongue is indented by previous compression from his teeth and endotracheal tube, and breath is extraordinarily malodorous.    GI: The abdomen is obese soft and nontender. Extremities: Skin: No rash  Recent Labs  Lab 12/07/17 0239 12/08/17 0508 12/09/17 0419  NA 138 140 142  K 3.2* 3.1* 3.0*  CL 95* 99 104  CO2 33* 32 30  BUN 47* 46* 39*  CREATININE 0.70 0.70 0.52*  GLUCOSE 151* 169* 190*   Electrolytes Recent Labs  Lab 12/04/17 0500 12/05/17 0500 12/06/17 0228 12/07/17 0239 12/08/17 0508 12/09/17 0419  CALCIUM 10.2 10.5* 10.7* 9.9 9.4 8.9  MG 1.8 2.0 2.2  --   --   --   PHOS  --  5.0* 4.2  --   --   --    CBC Recent Labs  Lab 12/07/17 0239 12/08/17 0508 12/09/17 0419  WBC 10.7* 12.6* 15.9*  HGB 11.4* 10.6* 10.7*  HCT 36.7* 35.3* 36.1*  PLT 420* 376 441*   Coag's Recent Labs  Lab 12/09/17 0734  APTT 33    Sepsis Markers No results for input(s): LATICACIDVEN, PROCALCITON, O2SATVEN in the last 168 hours.  ABG Recent Labs  Lab 12/05/17 0502 12/06/17 0335 12/09/17 0435  PHART 7.448 7.425 7.405  PCO2ART 58.0* 58.2* 48.7*  PO2ART 98.0 228* 88.8   Liver Enzymes No results for input(s): AST, ALT, ALKPHOS, BILITOT, ALBUMIN in the last 168 hours. Cardiac Enzymes No results for input(s): TROPONINI, PROBNP in the last 168 hours.  Glucose Recent Labs  Lab 12/09/17 0259 12/09/17 0357 12/09/17 0452 12/09/17 0552 12/09/17 0648 12/09/17 0754  GLUCAP 159* 184* 162* 157* 143* 132*   Imaging Dg Chest Port 1 View  Result Date: 12/09/2017 CLINICAL DATA:  Respiratory failure. EXAM: PORTABLE CHEST 1 VIEW COMPARISON:  12/06/2017. FINDINGS: Endotracheal tube, NG tube, right PICC line in stable position.  Cardiomegaly, mild bilateral pulmonary interstitial prominence, and small bilateral pleural effusions suggesting CHF. Persistent bibasilar atelectasis. CHF. IMPRESSION: 1.  Lines and tubes in stable position. 2. Cardiomegaly with bilateral interstitial prominence and small bilateral pleural effusions suggesting CHF. 3.  Persistent bibasilar atelectasis. Electronically Signed   By: [name] [name]  Register   On: 12/09/2017 10:34   CULTURES: 6/27 MRSA PCR >> neg 11/21/2017 sputum>> normal flora  ANTIBIOTICS: 11/18/2017 Unasyn>> 7/2 7/2 Cefotan >> 7/6  SIGNIFICANT EVENTS: 11/18/2017 intubated Right cervical lymph node biopsy 7/8 >> metastatic papillary thyroid carcinoma  LINES/TUBES: 11/18/2017 endotracheal tube>> 6/27 OGT >> 6/27 Foley >>  DISCUSSION: This is a 62 year old who is suffered from a post years circulation infarct and has had a stent placed in the vertebrobasilar system.  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  He also has a thyroid cancer which is encasing the trachea, which is likely invaded the trachea and was responsible for hemoptysis prior to admission.  We have attempted to determine the patient's desires as to the appropriate level of care, and unfortunately he is not sufficiently or consistently interactive enough to make his own wishes known in this setting.  ASSESSMENT / PLAN:  PULMONARY A: Vent dependent respiratory failure Possible airway compromise due to paratracheal/thyroid mass Pulmonary nodules, question metastatic  He has had marginal blood pressure overnight, and chest x-ray this morning shows increased markings in both lower lobes.  A sputum culture has been submitted, the most recent specimen grew [name] [name] shift.  He has been covered with cefepime.  The new trachea ostomy site is currently hemostatic.  I am anticipating resuming his heparin later this afternoon and Brilinta this evening.  CARDIOVASCULAR A:  Strip paroxysmal atrial fibrillation.  Full dose  heparin will resume at 1500.  Rate is controlled with a modest dose of Coreg. Coronary artery disease.   Peripheral vascular disease Continue Crestor, carvedilol Continue heparin drip     RENAL  A:   No acute renal issues   Thyroid Mass with possible associated airway compromise Diagnosis of papillary carcinoma established by biopsy during this admission.  Tracheostomy to secure a reliable airway was performed today so far without bleeding complication  GASTROINTESTINAL A:   History of GERD Nutrition P:   PPI as ordered We will need to place a feeding tube as his OG was lost at the time of tracheostomy.  HEMATOLOGIC A:   Chronic anticoagulation for atrial fibrillation Off anticoagulation for 3 weeks due to hemoptysis Follow CBC on aspirin, Brilinta, heparin drip  INFECTIOUS A:   Concerning leukocytosis and hypotension.  He has been extensively diuresed and there may be some hypovolemic component to the hypotension however I am concerned by the increased densities on his chest x-ray.  Serial procalcitonin's have been ordered along with repeat culture of his sputum.  Should he fail to respond to current therapy a broader search for a source  will be initiated and I will add better anaerobic coverage for his tongue.  ENDOCRINE Diabetes mellitus - ongoing hyperglycemia  P:   Early on IV sliding scale insulin I am going to leave him there is I am anticipating some lability with his blood sugars as we deal with the potential infection.  PAD Patient had a cool right foot which resolved with removing his right femoral sheath.    NEUROLOGIC A:   Multiple strokes, initially received TPA and then underwent basilar artery thrombectomy and stent placement Appreciate neurology management.  Poor prognosis for neurological recovery Brilinta, aspirin  Lars Masson, MD. Critical Care Medicine. Pager: [phone]. After hours pager: [phone].  12/09/2017, 10:45 AM

## 2017-12-09 NOTE — Transfer of Care (Signed)
Immediate Anesthesia Transfer of Care Note  Patient: [name] [name]  Procedure(s) Performed: TRACHEOSTOMY (N/A )  Patient Location: ICU  Anesthesia Type:General  Level of Consciousness: Patient remains intubated per anesthesia plan  Airway & Oxygen Therapy: Patient remains intubated per anesthesia plan and Patient placed on Ventilator (see vital sign flow sheet for setting)  Post-op Assessment: Report given to RN and Post -op Vital signs reviewed and stable  Post vital signs: Reviewed and stable  Last Vitals:  Vitals Value Taken Time  BP    Temp    Pulse    Resp    SpO2      Last Pain:  Vitals:   12/09/17 0400  TempSrc: Rectal  PainSc:          Complications: No apparent anesthesia complications

## 2017-12-09 NOTE — Progress Notes (Signed)
PCCM Interval Note   Seeing patient for ongoing hypotension and tachycardia despite levophed at 40 mcg/min. Patient hypotensive last night, received 2L IVF, and empirically started on cefepime after sputum cx 7/15 growing few [place].  Patient returned from OR from tracheostomy per Dr. Redmond Baseman this morning with no unusual intraoperative bleeding.  He has been afebrile today.  PCT noted at 0.12 today, additionally with cortisol 22, and am Hgb of 10.7.  His last dose of coreg given 7/17 afternoon.  Heparin was restarted at 1500.  No obvious or significant blood loss noted. Some residual bright red blood noted in MV tubing, trach site dry, and patient has not had tracheal suctioning. UOP adequate.      P:  Stat Hgb, recheck BMET/ Mag, as patient has ongoing hypokalemia Hold heparin and brillinta till Hgb assessed Add solu-cortef 50 mg q 6 Hold further vasopressor support D/c coreg   Dr. Gilford Raid called and spoke to patient's girlfriend and son on speaker who updated them on his worsening condition.  Daughter has apparently left town.  They are headed to hospital to hopefully have further [place] discussion given his poor prognosis and ongoing family discord of [place].  At this point, would advise continuation of current therapies with no escalation of care with no CPR,  but will discuss further when family arrives.    Kennieth Rad, AGACNP-BC [facility] Pgr: [phone] or if no answer [phone] 12/09/2017, 8:42 PM

## 2017-12-09 NOTE — Progress Notes (Signed)
Patient arrived to OR intubated and sedated with picc line and foley. Patient name and DOB confirmed with CRNA using armband and EMR.   Leatha Gilding, RN

## 2017-12-09 NOTE — Anesthesia Preprocedure Evaluation (Signed)
Anesthesia Evaluation  Patient identified by MRN, date of birth, ID band Patient awake    Reviewed: Allergy & Precautions, NPO status , Patient's Chart, lab work & pertinent test results  Airway Mallampati: Intubated       Dental   Pulmonary sleep apnea , former smoker,    Pulmonary exam normal        Cardiovascular hypertension, Pt. on medications + CAD  Normal cardiovascular exam     Neuro/Psych CVA    GI/Hepatic GERD  Medicated and Controlled,  Endo/Other  diabetes, Type 2  Renal/GU      Musculoskeletal   Abdominal   Peds  Hematology   Anesthesia Other Findings   Reproductive/Obstetrics                             Anesthesia Physical Anesthesia Plan  ASA: IV  Anesthesia Plan: General   Post-op Pain Management:    Induction: Intravenous  PONV Risk Score and Plan: 2 and Treatment may vary due to age or medical condition  Airway Management Planned: Tracheostomy and Oral ETT  Additional Equipment:   Intra-op Plan:   Post-operative Plan: Post-operative intubation/ventilation  Informed Consent: I have reviewed the patients History and Physical, chart, labs and discussed the procedure including the risks, benefits and alternatives for the proposed anesthesia with the patient or authorized representative who has indicated his/her understanding and acceptance.     Plan Discussed with: CRNA and Surgeon  Anesthesia Plan Comments:         Anesthesia Quick Evaluation

## 2017-12-09 NOTE — Progress Notes (Signed)
eLink Physician-Brief Progress Note Patient Name: [name] [name] DOB: [birth year] MRN: [id number]   Date of Service  12/09/2017  HPI/Events of Note  Hypotension since coming out of OR, on high dose levophed  eICU Interventions  Will ask bedside team to assess     Intervention Category Major Interventions: Hypotension - evaluation and management  Simonne Maffucci 12/09/2017, 7:49 PM

## 2017-12-09 NOTE — Progress Notes (Signed)
  Speech Language Pathology Treatment:    Patient Details Name: [name] [name] MRN: [id number] DOB: Jul 20, 1955 Today's Date: 12/09/2017 Time: 3729-0211 SLP Time Calculation (min) (ACUTE ONLY): 12 min  Assessment / Plan / Recommendation Clinical Impression  Pt attempting to follow commands with eye gaze with MD with about 75% accuracy. Pt able to open eyes wide on command, blink on command, keep eyes closed on commands. Provided instruction to pt with open eye to close and sustain closed for "yes" and attempt multiple blinks for "no" given that he could not accurately perform a single blink (he blinks several times without awareness). Initiated testing of accuracy with basic biographical questions. Pt achieved less than 50% accuracy and after a few attempts with max cueing pt indicated he was tired by keeping his eyes closed. There are plans to continue decreasing his sedation, so accuracy and control may improve with this method. May also attempt to utilize eye gaze though pt seems to have some difficulty moving eyes left.   HPI        SLP Plan  Continue with current plan of care       Recommendations                   Plan: Continue with current plan of care       GO               [facility], MA CCC-SLP [phone]  Lynann Beaver 12/09/2017, 12:43 PM

## 2017-12-09 NOTE — Op Note (Signed)
NAMEMARIN, [name] MEDICAL RECORD [id number] ACCOUNT [id number] DATE OF BIRTH:09/29/1955 FACILITY: [facility] LOCATION: [facility] PHYSICIAN:Maily Debarge Burdo D. Perian Tedder, MD  OPERATIVE REPORT  DATE OF PROCEDURE:  12/09/2017  PREOPERATIVE DIAGNOSES: 1.  Respiratory failure.  2.  Tracheal narrowing. 3.  Metastatic papillary carcinoma. 4.  Hemoptysis. 5.  Antiplatelet therapy.  POSTOPERATIVE DIAGNOSES:   1.  Respiratory failure.  2.  Tracheal narrowing. 3.  Metastatic papillary carcinoma. 4.  Hemoptysis. 5.  Antiplatelet therapy.  PROCEDURE:  Tracheostomy.  SURGEON:  Melida Quitter, MD.  ANESTHESIA:  General endotracheal anesthesia.  COMPLICATIONS:  None.  INDICATIONS:  The patient is a 62 year old male who presented to the hospital in late June with stroke symptoms and required anti-clot therapy as well as intravascular stent placement.  He has since been treated with antiplatelet therapy.  At admission  and for stroke therapy, he required intubation and had a history of 1 year of hemoptysis intermittently.  Bronchoscopy was performed at the time of intubation and there was erosion of the left tracheal wall seen.  Bleeding has remained controlled with  endotracheal tube in place, in spite of requiring antiplatelet therapy.  While neurologic prognosis does not look favorable, his family has elected to proceed with tracheostomy in order to help progresses care.  FINDINGS:  The patient has an obese neck.  Dissection down to the trachea was otherwise fairly unremarkable except for a little bit of nodular tissue in the thyroid isthmus that was excised and sent for pathology.  A #8 cuff Shiley trach tube was placed.  DESCRIPTION OF PROCEDURE:  The patient was identified in the intensive care unit and brought to the operating room suite and put on the operative table in supine position.  Anesthesia was induced and the patient was maintained via endotracheal  anesthesia.  The chin was taped up and a  shoulder roll was placed.  The incision was marked with a marking pen and injected with 1% lidocaine with 1:100,000 epinephrine.  The neck was prepped and draped in sterile fashion.  The incision was made with a  #15 blade scalpel and the subcutaneous fat was removed using Bovie electrocautery.  Dissection then continued in the midline down to the midline raphae of the strap muscles, which was divided in a vertical fashion and the strap muscles were retracted to  either side.  With palpating of the airway skeleton, the thyroid isthmus was approached.  A portion of the nodular tissue was removed with Bovie electrocautery and sent for pathology.  The isthmus was then divided with Bovie electrocautery off of the  trachea and retracted to either side, exposing the anterior tracheal wall.  A cricoid hook was placed to elevate the anatomy.  After cleaning off the anterior tracheal wall with a Kittner, a #15 blade scalpel was used to make an incision between rings 2  and 3 of the trachea, which was extended to either side using scissors.  A stay suture was placed using 2-0 silk suture around the ring above the trach site and also around the ring below the trach site.  The endotracheal tube cuff was deflated and the  tube backed out and the #8 cuff Shiley trach tube was placed and the cuff inflated.  The inner cannula was placed and the ventilator was attached.  There was a brief period of hypoxia that quickly recovered.  Stay sutures were tied with 2 knots above and  1 knot below the trach site.  At this point, because of the need for continued  antiplatelet therapy, the 6 foot long half inch strip of Vaseline gauze was packed in around the trach site incised with a generous tag hanging out.  The trach flange was  then sutured down to the skin using 0 silk suture in 4 quadrants.  Drapes were removed.  The patient was cleaned off.  Velcro trach dressing and a trach tie were added.  The patient was then returned to  anesthesia for transport and was moved back to the  intensive care unit in continued critical but stable condition.  TN/NUANCE  D:12/09/2017 T:12/09/2017 JOB:[id number]

## 2017-12-09 NOTE — Brief Op Note (Signed)
12/09/2017  9:17 AM  PATIENT:  [name] [name]  62 y.o. male  PRE-OPERATIVE DIAGNOSIS:  Respiratory failure, tracheal narrowing, hemoptysis, papillary thyroid cancer, and anti-platelet therapy  POST-OPERATIVE DIAGNOSIS:  same  PROCEDURE:  Procedure(s): TRACHEOSTOMY (N/A)  SURGEON:  Surgeon(s) and Role:    Melida Quitter, MD - Primary  PHYSICIAN ASSISTANT:   ASSISTANTS: none   ANESTHESIA:   general  EBL: Minima  BLOOD ADMINISTERED:none  DRAINS: none   LOCAL MEDICATIONS USED:  LIDOCAINE   SPECIMEN:  Source of Specimen:  Thyroid isthmus tissue  DISPOSITION OF SPECIMEN:  PATHOLOGY  COUNTS:  YES  TOURNIQUET:  * No tourniquets in log *  DICTATION: .Other Dictation: Dictation Number [id number]  PLAN OF CARE: Return to ICU  PATIENT DISPOSITION:  ICU - intubated and critically ill.   Delay start of Pharmacological VTE agent (>24hrs) due to surgical blood loss or risk of bleeding: no

## 2017-12-09 NOTE — Anesthesia Procedure Notes (Signed)
Arterial Line Insertion Start/End7/18/2019 8:10 AM, 12/09/2017 8:20 PM Performed by: Lillia Abed, MD, anesthesiologist  Preanesthetic checklist: patient identified, IV checked, risks and benefits discussed, surgical consent, monitors and equipment checked, pre-op evaluation, timeout performed and anesthesia consent Left, brachial was placed Catheter size: 20 G Hand hygiene performed  and Seldinger technique used  Attempts: 1 Procedure performed without using ultrasound guided technique. Following insertion, line sutured and dressing applied. Patient tolerated the procedure well with no immediate complications.

## 2017-12-09 NOTE — Progress Notes (Signed)
   Subjective:    Patient ID: [name] [name], male    DOB: 11-16-1955, 62 y.o.   MRN: [id number]  HPI Continues intubated on ventilator.  Family has elected to proceed with tracheostomy placement.  There remains concern about airway narrowing and potential bleeding.  FNA of right cervical lymph node demonstrated papillary thyroid carcinoma.  Review of Systems     Objective:   Physical Exam Intubated, sedated. Normal neck.    Assessment & Plan:  Respiratory failure, tracheal narrowing, papillary thyroid carcinoma, recent stroke, anti-platelet therapy, hemoptysis  His family has elected to proceed with tracheostomy.  His neurologic prognosis is poor and he appears to have metastatic papillary cancer possibly accounting for tracheal erosion and resultant hemoptysis.  Tracheostomy will facilitate ventilator weaning.  I discussed the procedure at length with his family by phone.  Management of his tracheal erosion and bleeding ultimately may require tracheal resection along with thyroidectomy.  Attempts to control bleeding short of resection are not likely to be durable.  He continues to require anti-platelet management due to his recent strokes and placement of an intravascular stent.  Risks, benefits, and alternatives of surgery were discussed and the family expressed understanding and agreement.

## 2017-12-09 NOTE — Progress Notes (Signed)
IP PROGRESS NOTE  Subjective:   [name] [name] appears unchanged.  Multiple family members are at the bedside.  Objective: Vital signs in last 24 hours: Blood pressure (!) 106/52, pulse 90, temperature 98 F (36.7 C), temperature source Rectal, resp. rate 18, height 5\' 10"  (1.778 m), weight (!) 304 lb 0.2 oz (137.9 kg), SpO2 99 %.  Intake/Output from previous day: 07/17 0701 - 07/18 0700 In: 6206 [I.V.:2830.9; NG/GT:1139.7; IV Piggyback:2235.4] Out: 2190 [Urine:2190]  Physical Exam:  Neurologic: Alert, blinks eyes to command   Lab Results: Recent Labs    12/08/17 0508 12/09/17 0419  WBC 12.6* 15.9*  HGB 10.6* 10.7*  HCT 35.3* 36.1*  PLT 376 441*    BMET Recent Labs    12/08/17 0508 12/09/17 0419  NA 140 142  K 3.1* 3.0*  CL 99 104  CO2 32 30  GLUCOSE 169* 190*  BUN 46* 39*  CREATININE 0.70 0.52*  CALCIUM 9.4 8.9    No results found for: CEA1  Studies/Results: No results found.  Medications: I have reviewed the patient's current medications.  Assessment/Plan: 1.  Papillary thyroid cancer  CT neck 11/26/2017- left thyroid mass with partial encasement of the trachea, small calcified lower cervical nodes, upraclavicular nodes, and superior mediastinal nodes concerning for metastatic disease  CT chest 11/18/2017- small mediastinal lymph nodes, stable compared to 08/23/2016, left thyroid nodule  Ultrasound-guided biopsy of a right cervical lymph node 11/29/2017-metastatic papillary carcinoma  2.  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   Diabetes  4.   Paroxysmal atrial fibrillation  5.  History of hemoptysis- potentially related to  tracheal invasion from the thyroid mass, admission April 2018 with hemoptysis with bronchoscopy confirming bloody secretions from the left lower lung, no ulceration or mass was seen  6.  Ventilator dependent respiratory failure  7.  Peripheral vascular disease  8.  History of coronary artery disease  9.  Fever/hypotension, pneumonia -per critical care medicine   The plan for a tracheostomy is noted.  I had a brief discussion with his family at the bedside.  I will be out until 12/14/2017.  Please call oncology as needed.        LOS: 21 days   Betsy Coder, MD   12/09/2017, 6:47 AM

## 2017-12-09 NOTE — Progress Notes (Signed)
STROKE TEAM PROGRESS NOTE   SUBJECTIVE (INTERVAL HISTORY) His RN and speech therapist are at the bedside. Pt just had trach this am, still on vent, able to open eyes on voice and follow limited verbal commands, not moving extremities.    OBJECTIVE Temp:  [97.7 F (36.5 C)-102.2 F (39 C)] 98 F (36.7 C) (07/18 0400) Pulse Rate:  [68-132] 86 (07/18 1045) Cardiac Rhythm: Atrial fibrillation (07/17 2000) Resp:  [0-27] 20 (07/18 1045) BP: (64-149)/(24-94) 86/70 (07/18 1045) SpO2:  [96 %-100 %] 100 % (07/18 1115) FiO2 (%):  [40 %-100 %] 40 % (07/18 1115) Weight:  [304 lb 0.2 oz (137.9 kg)] 304 lb 0.2 oz (137.9 kg) (07/18 0400)  CBC:  Recent Labs  Lab 12/08/17 0508 12/09/17 0419  WBC 12.6* 15.9*  NEUTROABS 10.1* 12.8*  HGB 10.6* 10.7*  HCT 35.3* 36.1*  MCV 90.3 89.6  PLT 376 441*    Basic Metabolic Panel:  Recent Labs  Lab 12/05/17 0500 12/06/17 0228  12/08/17 0508 12/09/17 0419  NA 135 138   < > 140 142  K 4.0 3.5   < > 3.1* 3.0*  CL 88* 89*   < > 99 104  CO2 36* 35*   < > 32 30  GLUCOSE 234* 230*   < > 169* 190*  BUN 34* 49*   < > 46* 39*  CREATININE 0.97 0.81   < > 0.70 0.52*  CALCIUM 10.5* 10.7*   < > 9.4 8.9  MG 2.0 2.2  --   --   --   PHOS 5.0* 4.2  --   --   --    < > = values in this interval not displayed.    Lipid Panel:     Component Value Date/Time   CHOL 110 11/19/2017 0437   CHOL 119 08/09/2017 1000   TRIG 340 (H) 11/19/2017 0437   HDL 19 (L) 11/19/2017 0437   HDL 29 (L) 08/09/2017 1000   CHOLHDL 5.8 11/19/2017 0437   VLDL 68 (H) 11/19/2017 0437   LDLCALC 23 11/19/2017 0437   LDLCALC 30 08/09/2017 1000   HgbA1c:  Lab Results  Component Value Date   HGBA1C 9.1 (H) 11/19/2017   Urine Drug Screen: No results found for: LABOPIA, COCAINSCRNUR, LABBENZ, AMPHETMU, THCU, LABBARB  Alcohol Level No results found for: [place] I have personally reviewed the radiological images below and agree with the radiology interpretations.  Ct Angio  Head W Or Wo Contrast  Ct Angio Neck W Or Wo Contrast 11/18/2017 Since the study of earlier today, there are only 2 changes. There is new consolidation and volume loss in the right upper lobe. 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 Flow is seen distal to that however, presumably indicating patent posterior communicating arteries.   Ct Angio Head W Or Wo Contrast Ct Angio Neck W Or Wo Contrast 11/18/2017 1. Severe intracranial and extracranial atherosclerotic disease. No emergent large vessel occlusion  2. Severe stenosis right carotid bifurcation due to calcified plaque, estimated 90% diameter stenosis. No significant left carotid stenosis. Severe stenosis cavernous carotid bilaterally.  3. Both vertebral arteries are patent in the neck with severe calcific stenosis V4 segment bilaterally.  4. 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 Small calcified lymph nodes left neck suspicious for metastatic disease  5. Small lung nodules on the right, possible metastatic disease. Recommend chest CT for further evaluation.   Ct Head Wo Contrast 11/19/2017 1. Expected evolution of brainstem  infarct without hemorrhage.  2. No new infarcts.  3. Left vertebral artery and basilar artery stent.  4. Acute on chronic sinus disease may be related to intubation.   Ct Chest W Contrast 11/18/2017 Right upper lobe airspace opacity is noted with air bronchograms concerning for pneumonia or possibly atelectasis. Stable mediastinal adenopathy is noted compared to prior exam. Endotracheal and nasogastric tubes are in grossly good position. Coronary artery calcifications are noted suggesting coronary artery disease. Possible 2.8 cm left thyroid nodule. Thyroid ultrasound is recommended for further evaluation. Aortic Atherosclerosis (ICD10-I70.0).   Mr Brain Wo Contrast 11/19/2017 1. Acute/early subacute infarction within the central pons involving both sides, greater on the right, measuring up  to 2.6 cm, 3.6 cc. No associated hemorrhage or significant mass effect.  2. Additional punctate acute/early subacute infarctions within left lateral cerebellum and right parietal cortex.  3. Minimal chronic microvascular ischemic changes of the brain and mild parenchymal volume loss.   Ct Head Code Stroke Wo Contrast 11/18/2017 1. No acute intracranial abnormality.  Atherosclerotic calcification  2. ASPECTS is 10   Cerebral Angiogram / Stent  11/18/2017 1. 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 Occluded non dominant RT VBJ. 2. S/P endovascular revascularization of the distal basilar artery  To 90 % patency , and of 30 to 40 % of the LT VBJ with stent placement. Dominant  Lt Pcom.  Transthoracic Echocardiogram - Left ventricle: The cavity size was moderately dilated. Wall thickness was increased in a pattern of severe LVH. Systolic function was normal. The estimated ejection fraction was in the range of 55% to 60%. Wall motion was normal; there were no regional wall motion abnormalities. The study is not technically sufficient to allow evaluation of LV diastolic function. - Aortic valve: Trileaflet. Sclerosis without stenosis. There was no regurgitation. Valve area (Vmax): 2.88 cm^2. - Aorta: Aortic root ML diameter: 41.95 mm (ED). - Aortic root: The aortic root is mildly dilated. - Mitral valve: Calcified annulus. There was trivial regurgitation. - Left atrium: The atrium was mildly dilated. - Right ventricle: The cavity size was mildly dilated. Mildly reduced systolic function. - Inferior vena cava: The vessel was normal in size. The respirophasic diameter changes were in the normal range (>= 50%), consistent with normal central venous pressure. Impressions:  Compared to a prior study in 02/2017, there have been no significant changes.  Ct Head Wo Contrast 11/24/2017 1. Continued normal expected interval evolution of  brainstem infarct. No evidence for hemorrhagic transformation or other complication.  2. No other new intracranial abnormality.  3. Moderate paranasal sinus disease with bilateral mastoid effusions, likely related intubation.   Dg Chest Port 1 View 11/25/2017 Mild vascular congestion. Tubes and lines as described.   Ct Soft Tissue Neck W Contrast 11/26/2017 Aside from the fact that the patient shows endotracheal intubation and the presence of an orogastric tube, and aside from development of bilateral layering pleural effusions with dependent atelectasis, the findings are unchanged since the previous studies. Irregular mass arising from the inferior left thyroid lobe, measuring approximately 5 x 3.4 x 5.5 cm, with invasion of the surrounding tissues worrisome for thyroid carcinoma. Partial encasement of the trachea. Broad surface along the esophagus. Small but likely pathologic nodes in the lower neck and superior mediastinum, some with calcification, probably involved by metastatic disease.   Dg Chest Port 1 View 11/29/2017 Tube and catheter positions as described without pneumothorax. There is pulmonary vascular  congestion with interstitial edema and small pleural effusions. Suspect a degree of underlying congestive heart failure. No airspace consolidation.    Lymph node, needle/core biopsy - METASTATIC PAPILLARY THYROID CARCINOMA.   PHYSICAL EXAM General - morbid obesity,middle aged Caucasian male, intubated on low dose sedation.  Ophthalmologic - fundi not visualized due to noncooperation.  Cardiovascular - irregularly irregular heart rate and rhythm, afib RVR.  Neuro - intubated on low dose sedation, eyes open, following commands with eye movement and eye close and opening, not blinking to visual threat bilaterally, PERRL. Weak corneal bilaterally, R>L. Positive gag. Facial symmetry not able to test due to ET tube, not able to move tongue or mouth on command. No movement on all extremities  even with pain stimulation, DTR diminished and no babinski. Sensation, coordination and gait not tested.   ASSESSMENT/PLAN [name] is a 62 y.o. male with PMH of DM, hemoptysis, OSA, HTN, HLD, CAD, afib off Eliquis due to hemoptysis presenting with slurred speech, right sided weakness. Was intubated for hemoptysis prevention while giving tPA. Symptoms worsened and imaging confirmed BA occlusion. S/p IR with revascularization of BA as well as left VA s/p stenting.  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   MRI head - large acute/early subacute infarction within the central pons involving both sides, greater on the right.  CTA H&N - severe vascular athero including BA stenosis, b/l VA stenosis, right ICA proximal 90% stenosis, b/l cavernous ICA stenosis R>L.  Repeat CTA H/N - new distal BA occlusion  DSA - revascularization of the distal basilar artery to 90 % patency , and of 30 to 40 % of the LT VBJ with stent placement.  CT head 11/24/17 showed normal evolution of brainstem infarct, no acute change  2D Echo - EF 55-60%  LDL - 23  HgbA1c - 9.1  VTE prophylaxis - heparin subq  Eliquis (recently stopped) and plavix prior to admission, now on IV heparin drip.   Ongoing aggressive stroke risk factor management  Therapy recommendations:  pending  Disposition:  Pending  Palliative care on board  METASTATIC PAPILLARY THYROID CARCINOMA  CTA neck - right large thyroid mass compressing on trachea.   Thyroid mass larger than CT chest in 08/2016  Evidence of tracheal erosion on bronch at this  admission  CT neck with contrast showed thyroid carcinoma, encasement of trachea.    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   oncology Dr Learta Codding on board  Respiratory failure  CCM on board  Still on vent  Wean off sedation as able  S/p Trach this am   PAF  Currently in afib RVR with respiratory distress  On eliquis prior but off due to hemoptysis  On heparin IV now  Continue brilinta due to [place] stent  Coreg bid for rate control  Cerebral vascular athero  CTA head and neck showed right ICA proximal 90% stenosis, left ICA proximal athero, b/l cavernous ICA severe athro R>L, BA severe stenosis, b/l V4 severe athero with right V4 occlusion  S/p left V4 stent and BA revascularization  On brilinta and heparin IV  Hemoptysis, improved  Since 08/2016 as per family  Not sure about etiology  TB ruled out in the past  Likely tracheal erosion from right thyroid mass   CT neck showed thyroid carcinoma, encasement of trachea.   CCM on board  Consider tertiary center transfer for mass debulking or tracheal resection  Sepsis  101.8->102.2->101.6  Leukocytosis WBC 10.7->12.6->15.9  Hypotension - on levophed  On cefepime  CXR - bibasilar atelectasis, CHF  CHF  Off lasix   CXR showed persistent CHF  On coreg  Gentle hydration  Hypertension  Stable  On coreg bid  Also on lasix Q8h for fluid overload . Long term BP goal normotensive.  Hyperlipidemia  Lipid lowering medication PTA: Crestor 10 mg daily  LDL 23, goal < 70  On Crestor  Continue statin at discharge  Diabetes  HgbA1c 9.1, goal < 7.0  Uncontrolled  continued on insulin drip  Continued hyperglycemia  CCM on board   CBG monitoring  Other Stroke Risk Factors  Advanced age  Former cigarette smoker - quit, 40 pack year history  Morbid obesity, Body mass index is 43.62 kg/m.    Hx stroke/TIA  Coronary artery disease   PAD with  claudication  OSA on CPAP  Other Active Problems  Palliative care on board  This patient is critically ill and at significant risk of neurological worsening, death and care requires constant monitoring of vital signs, hemodynamics,respiratory and cardiac monitoring, extensive review of multiple databases, frequent neurological assessment, discussion with family, other specialists and medical decision making of high complexity. I spent 35 minutes of neurocritical care time  in the care of  this patient. I also discussed with speech therapist and Dr. Dominica Severin at the bedside.    Rosalin Hawking, MD PhD Stroke Neurology 12/09/2017 11:49 AM   To contact Stroke Continuity provider, please refer to [email or web address] After hours, contact General Neurology

## 2017-12-09 NOTE — Anesthesia Postprocedure Evaluation (Signed)
Anesthesia Post Note  Patient: [name] [name]  Procedure(s) Performed: TRACHEOSTOMY (N/A )     Patient location during evaluation: PACU Anesthesia Type: General Level of consciousness: awake and alert Pain management: pain level controlled Vital Signs Assessment: post-procedure vital signs reviewed and stable Respiratory status: spontaneous breathing, nonlabored ventilation, respiratory function stable and patient connected to nasal cannula oxygen Cardiovascular status: blood pressure returned to baseline and stable Postop Assessment: no apparent nausea or vomiting Anesthetic complications: no    Last Vitals:  Vitals:   12/09/17 1645 12/09/17 1700  BP: (!) 99/58 (!) 117/94  Pulse: 97 98  Resp: (!) 28 (!) 31  Temp:    SpO2: 99% 100%    Last Pain:  Vitals:   12/09/17 1241  TempSrc: Oral  PainSc:                  Desaree Downen DAVID

## 2017-12-09 NOTE — OR Nursing (Signed)
Petrolatum Dressing packing in and around tracheostomy site by Dr. Redmond Baseman. Packing 1/2in x 72in.   Leatha Gilding, RN

## 2017-12-10 ENCOUNTER — Inpatient Hospital Stay (HOSPITAL_COMMUNITY): Payer: 59

## 2017-12-10 ENCOUNTER — Encounter (HOSPITAL_COMMUNITY): Payer: Self-pay | Admitting: Otolaryngology

## 2017-12-10 LAB — RENAL FUNCTION PANEL
ALBUMIN: 2.1 g/dL — AB (ref 3.5–5.0)
ANION GAP: 9 (ref 5–15)
BUN: 17 mg/dL (ref 8–23)
CALCIUM: 8.6 mg/dL — AB (ref 8.9–10.3)
CO2: 26 mmol/L (ref 22–32)
Chloride: 105 mmol/L (ref 98–111)
Creatinine, Ser: 0.46 mg/dL — ABNORMAL LOW (ref 0.61–1.24)
GLUCOSE: 160 mg/dL — AB (ref 70–99)
PHOSPHORUS: 2.1 mg/dL — AB (ref 2.5–4.6)
POTASSIUM: 3.6 mmol/L (ref 3.5–5.1)
SODIUM: 140 mmol/L (ref 135–145)

## 2017-12-10 LAB — GLUCOSE, CAPILLARY
GLUCOSE-CAPILLARY: 130 mg/dL — AB (ref 70–99)
GLUCOSE-CAPILLARY: 135 mg/dL — AB (ref 70–99)
GLUCOSE-CAPILLARY: 139 mg/dL — AB (ref 70–99)
GLUCOSE-CAPILLARY: 154 mg/dL — AB (ref 70–99)
GLUCOSE-CAPILLARY: 156 mg/dL — AB (ref 70–99)
GLUCOSE-CAPILLARY: 159 mg/dL — AB (ref 70–99)
GLUCOSE-CAPILLARY: 159 mg/dL — AB (ref 70–99)
GLUCOSE-CAPILLARY: 175 mg/dL — AB (ref 70–99)
GLUCOSE-CAPILLARY: 176 mg/dL — AB (ref 70–99)
GLUCOSE-CAPILLARY: 179 mg/dL — AB (ref 70–99)
Glucose-Capillary: 153 mg/dL — ABNORMAL HIGH (ref 70–99)
Glucose-Capillary: 166 mg/dL — ABNORMAL HIGH (ref 70–99)
Glucose-Capillary: 160 mg/dL — ABNORMAL HIGH (ref 70–99)
Glucose-Capillary: 154 mg/dL — ABNORMAL HIGH (ref 70–99)
Glucose-Capillary: 138 mg/dL — ABNORMAL HIGH (ref 70–99)
Glucose-Capillary: 161 mg/dL — ABNORMAL HIGH (ref 70–99)
Glucose-Capillary: 156 mg/dL — ABNORMAL HIGH (ref 70–99)
Glucose-Capillary: 157 mg/dL — ABNORMAL HIGH (ref 70–99)
Glucose-Capillary: 195 mg/dL — ABNORMAL HIGH (ref 70–99)
Glucose-Capillary: 169 mg/dL — ABNORMAL HIGH (ref 70–99)
Glucose-Capillary: 150 mg/dL — ABNORMAL HIGH (ref 70–99)

## 2017-12-10 LAB — POCT I-STAT 7, (LYTES, BLD GAS, ICA,H+H)
ACID-BASE EXCESS: 6 mmol/L — AB (ref 0.0–2.0)
BICARBONATE: 30.2 mmol/L — AB (ref 20.0–28.0)
Calcium, Ion: 1.24 mmol/L (ref 1.15–1.40)
HCT: 33 % — ABNORMAL LOW (ref 39.0–52.0)
Hemoglobin: 11.2 g/dL — ABNORMAL LOW (ref 13.0–17.0)
O2 SAT: 88 %
PH ART: 7.455 — AB (ref 7.350–7.450)
PO2 ART: 53 mmHg — AB (ref 83.0–108.0)
Potassium: 3.5 mmol/L (ref 3.5–5.1)
Sodium: 142 mmol/L (ref 135–145)
TCO2: 32 mmol/L (ref 22–32)
pCO2 arterial: 43.1 mmHg (ref 32.0–48.0)

## 2017-12-10 LAB — CBC WITH DIFFERENTIAL/PLATELET
ABS IMMATURE GRANULOCYTES: 0.1 10*3/uL (ref 0.0–0.1)
Basophils Absolute: 0 10*3/uL (ref 0.0–0.1)
Basophils Relative: 0 %
EOS ABS: 0 10*3/uL (ref 0.0–0.7)
Eosinophils Relative: 0 %
HEMATOCRIT: 35.2 % — AB (ref 39.0–52.0)
HEMOGLOBIN: 10.8 g/dL — AB (ref 13.0–17.0)
IMMATURE GRANULOCYTES: 1 %
LYMPHS ABS: 0.9 10*3/uL (ref 0.7–4.0)
LYMPHS PCT: 6 %
MCH: 26.9 pg (ref 26.0–34.0)
MCHC: 30.7 g/dL (ref 30.0–36.0)
MCV: 87.6 fL (ref 78.0–100.0)
Monocytes Absolute: 0.5 10*3/uL (ref 0.1–1.0)
Monocytes Relative: 3 %
NEUTROS PCT: 90 %
Neutro Abs: 13.6 10*3/uL — ABNORMAL HIGH (ref 1.7–7.7)
Platelets: 412 10*3/uL — ABNORMAL HIGH (ref 150–400)
RBC: 4.02 MIL/uL — AB (ref 4.22–5.81)
RDW: 14.2 % (ref 11.5–15.5)
WBC: 15.5 10*3/uL — AB (ref 4.0–10.5)

## 2017-12-10 LAB — CULTURE, RESPIRATORY W GRAM STAIN: Special Requests: NORMAL

## 2017-12-10 LAB — HEPARIN LEVEL (UNFRACTIONATED): Heparin Unfractionated: 0.32 IU/mL (ref 0.30–0.70)

## 2017-12-10 LAB — CULTURE, RESPIRATORY

## 2017-12-10 LAB — PROCALCITONIN: Procalcitonin: 0.15 ng/mL

## 2017-12-10 LAB — MAGNESIUM: Magnesium: 1.9 mg/dL (ref 1.7–2.4)

## 2017-12-10 MED ORDER — VITAL HIGH PROTEIN PO LIQD
1000.0000 mL | ORAL | Status: DC
  Administered 2017-12-10 – 2017-12-11 (×2): 1000 mL

## 2017-12-10 MED ORDER — VANCOMYCIN HCL 10 G IV SOLR
2500.0000 mg | Freq: Once | INTRAVENOUS | Status: AC
  Administered 2017-12-10: 2500 mg via INTRAVENOUS
  Filled 2017-12-10 (×2): qty 2500

## 2017-12-10 MED ORDER — VANCOMYCIN HCL 10 G IV SOLR
1250.0000 mg | Freq: Two times a day (BID) | INTRAVENOUS | Status: DC
  Administered 2017-12-10 – 2017-12-13 (×6): 1250 mg via INTRAVENOUS
  Filled 2017-12-10 (×7): qty 1250

## 2017-12-10 MED ORDER — PIPERACILLIN-TAZOBACTAM 3.375 G IVPB
3.3750 g | Freq: Three times a day (TID) | INTRAVENOUS | Status: DC
  Administered 2017-12-10 – 2017-12-14 (×12): 3.375 g via INTRAVENOUS
  Filled 2017-12-10 (×12): qty 50

## 2017-12-10 MED ORDER — DEXMEDETOMIDINE HCL IN NACL 400 MCG/100ML IV SOLN
0.2000 ug/kg/h | INTRAVENOUS | Status: DC
  Administered 2017-12-10: 0.3 ug/kg/h via INTRAVENOUS
  Administered 2017-12-11 (×4): 0.4 ug/kg/h via INTRAVENOUS
  Administered 2017-12-12: 0.3 ug/kg/h via INTRAVENOUS
  Administered 2017-12-12: 0.4 ug/kg/h via INTRAVENOUS
  Administered 2017-12-12 – 2017-12-14 (×4): 0.3 ug/kg/h via INTRAVENOUS
  Administered 2017-12-14: 0.4 ug/kg/h via INTRAVENOUS
  Administered 2017-12-14: 0.3 ug/kg/h via INTRAVENOUS
  Administered 2017-12-15 (×2): 0.6 ug/kg/h via INTRAVENOUS
  Filled 2017-12-10 (×14): qty 100

## 2017-12-10 NOTE — Progress Notes (Signed)
Cortrak Tube Team Note:  Consult received to place a Cortrak feeding tube.   A 10 F Cortrak tube was placed in the LEFT nare and secured with a nasal bridle at 85 cm. Per the Cortrak monitor reading the tube tip is post pyloric.   No x-ray is required. RN may begin using tube.   If the tube becomes dislodged please keep the tube and contact the Cortrak team at [email or web address] (password [email or web address]) for replacement.  If after hours and replacement cannot be delayed, place a NG tube and confirm placement with an abdominal x-ray.    Mariana Single RD, LDN Clinical Nutrition Pager # [phone]

## 2017-12-10 NOTE — Progress Notes (Signed)
RT note: Attempted to place patient on CPAP/PSV of 12/8 however patient had no respiratory rate.  Changed back to full support.  Will attempt again when more awake.  Will continue to monitor.

## 2017-12-10 NOTE — Progress Notes (Signed)
Spoke with pt's significant other, [name], to discuss discharge planning and explain case management role.  She is appreciative of my call; states this has been a trying time, with pt being so ill and family attempting to make such difficult decisions.  She states that pt's brother is interested in a facility in [place] that is specialized in ventilator weaning.  We then discussed the type of facility that pt may need eventually--I explained that pt will likely need [facility] when more stable, and informed her of the two available LTACs in [place].  I am aware of [facility], which is an LTAC, but Curt Bears stated that pt's brother mentioned [facility].  I explained to her that [place] [facility] is a hospital, like [place], and pt would only be eligible for transfer to another hospital if a certain service was needed that we do not provide.  Of course, LTAC transfer would only happen upon insurance approval and when attending MD feels pt stable for this transition.  She states she will share this information with pt's brother; I provided my contact information, should he have any questions about this.  I plan to meet with Curt Bears on Monday between 9:30 and 10:00 to formally introduce myself, and see if any questions came up over the weekend.  Will continue to follow.    Reinaldo Raddle, RN, BSN  Trauma/Neuro ICU Case Manager [phone]

## 2017-12-10 NOTE — Progress Notes (Signed)
  Speech Language Pathology Treatment: Cognitive-Linquistic  Patient Details Name: Brenen [name] MRN: [id number] DOB: [birth year] Today's Date: 12/10/2017 Time: 6294-7654 SLP Time Calculation (min) (ACUTE ONLY): 28 min  Assessment / Plan / Recommendation Clinical Impression  Used eye gaze device with pt with binary Y/N questions. Pt responded with 100% accuracy with basic biographical questions and complex language questions. Questioner should stand on [name] [name] right side, he should be well positioned with good head and neck support and be able to make eye contact directly through the hole in the board.   [name] [name] will look right at Yes or left at No. The questioner should confirm [name] [name] response, ex: "you said yes."  If mike disagrees or made a mistake he should be instructed to close his eyes. After each question is complete he should resume eye contact in the middle of the board. There are instructions in the room.    [name] [name] responded Y when asked if he understood that he has had a stroke, that he has cancer, that he may not be able to speak, eat or walk again. These questions should be asked repeatedly in a variety of forms to determine [name] [name] comprehension of his situation. Would have several sessions of questions with family and provider if [name] [name] starts to indicate his wishes through this method.  He may be inconsistent if he is tired and may need frequent rest breaks. Please page [phone] if SLP assistance is needed on Saturday or Sunday.     HPI HPI: [name] [name]  is a 62 year old man with atrial fibrillation, formally on anticoagulation was stopped due to recent hemoptysis.  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  He presented with an acute stroke 6/27, was intubated and potential tracheal erosion site covered with cuff balloon to facilitate TPA.  Unfortunately during the hospitalization is also experienced a second stroke and underwent mechanical  thrombectomy, stent placement 7/1. MRI shows Acute/early subacute infarction within the central pons involving both sides, greater on the right, measuring up to 2.6 cm, 3.6 cc. Additional punctate acute/early subacute infarctions within left lateral cerebellum and right parietal cortex. 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 Orally intubated at the time of initial assessment.       SLP Plan  Continue with current plan of care       Recommendations                   Oral Care Recommendations: Oral care BID Follow up Recommendations: LTACH Plan: Continue with current plan of care       GO               [name], MA CCC-SLP [phone]  [name] [name] 12/10/2017, 3:13 PM

## 2017-12-10 NOTE — Progress Notes (Signed)
PULMONARY / CRITICAL CARE MEDICINE   Name: [name] [name] MRN: [id number] DOB: [birth year]    ADMISSION DATE:  11/18/2017 CONSULTATION DATE: 11/18/2017  REFERRING MD: Emergency department  CHIEF COMPLAINT: Stroke  HISTORY OF PRESENT ILLNESS:    [name] [name]  is a 62 year old man with atrial fibrillation, formally on anticoagulation was stopped due to recent hemoptysis.  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  He presented with an acute stroke 6/27, was intubated and potential tracheal erosion site covered with cuff balloon to facilitate TPA.  Unfortunately during the hospitalization is also experienced a second stroke and underwent mechanical thrombectomy, stent placement 7/1.  SUBJECTIVE: Tracheostomy was performed 7/18.  There was no unusual intraoperative bleeding.  He was again hypotensive overnight last night and was placed on pressors. He has not had significant external blood loss. He is febrile and on a cooling blanket this morning. Now on 10 units/hr insulin, 15 mcg Levophed. He remains mechanically ventilated.       VITAL SIGNS: BP (!) 143/110   Pulse 84   Temp 100.3 F (37.9 C) (Core)   Resp 20   Ht 5\' 10"  (1.778 m)   Wt (!) 303 lb 12.7 oz (137.8 kg)   SpO2 100%   BMI 43.59 kg/m   HEMODYNAMICS:    VENTILATOR SETTINGS: Vent Mode: PCV FiO2 (%):  [40 %] 40 % Set Rate:  [20 bmp] 20 bmp Vt Set:  [580 mL] 580 mL PEEP:  [8 cmH20] 8 cmH20 Plateau Pressure:  [21 cmH20-25 cmH20] 21 cmH20  INTAKE / OUTPUT: I/O last 3 completed shifts: In: 11602.1 [I.V.:7824.9; Other:75; NG/GT:424.7; IV Piggyback:3277.5] Out: 2815 [Urine:2805; Blood:10]  PHYSICAL EXAMINATION: General: Obese middle-aged male who is now mechanically ventilated via tracheostomy.      HEENT: [place]/AT, PERRL, EOM-I and MMM Neuro: He has spontaneous eye opening it is not tracking for me today nor is he blinking in response to questions.  He has no limb movement.    CV: 1 and S2 irregularly irregular without murmur rub or gallop    PULM: The tracheostomy site is currently dry.  Respirations are unlabored, there is symmetric air movement, some scattered rhonchi and no wheezes.  Minute ventilation is elevated approximately 14 L/min.      GI: The abdomen is obese soft and nontender. Extremities: Skin: No rash  Recent Labs  Lab 12/09/17 0419 12/09/17 0840 12/09/17 2014 12/10/17 0500  NA 142 142 140 140  K 3.0* 3.5 3.8 3.6  CL 104  --  105 105  CO2 30  --  27 26  BUN 39*  --  21 17  CREATININE 0.52*  --  0.49* 0.46*  GLUCOSE 190*  --  176* 160*   Electrolytes Recent Labs  Lab 12/06/17 0228  12/09/17 0419 12/09/17 2014 12/10/17 0500  CALCIUM 10.7*   < > 8.9 8.8* 8.6*  MG 2.2  --   --  1.9 1.9  PHOS 4.2  --   --  <1.0* 2.1*   < > = values in this interval not displayed.   CBC Recent Labs  Lab 12/08/17 0508 12/09/17 0419 12/09/17 0840 12/09/17 2014 12/10/17 0500  WBC 12.6* 15.9*  --   --  15.5*  HGB 10.6* 10.7* 11.2* 11.2* 10.8*  HCT 35.3* 36.1* 33.0* 36.8* 35.2*  PLT 376 441*  --   --  412*   Coag's Recent Labs  Lab 12/09/17 0734  APTT 33    Sepsis Markers Recent Labs  Lab 12/09/17 1217 12/10/17 0500  PROCALCITON 0.12 0.15    ABG Recent Labs  Lab 12/06/17 0335 12/09/17 0435 12/09/17 0840  PHART 7.425 7.405 7.455*  PCO2ART 58.2* 48.7* 43.1  PO2ART 228* 88.8 53.0*   Liver Enzymes Recent Labs  Lab 12/09/17 2014 12/10/17 0500  ALBUMIN 2.2* 2.1*   Cardiac Enzymes No results for input(s): TROPONINI, PROBNP in the last 168 hours.  Glucose Recent Labs  Lab 12/10/17 0055 12/10/17 0156 12/10/17 0304 12/10/17 0356 12/10/17 0617 12/10/17 0654  GLUCAP 159* 153* 166* 156* 160* 154*   Imaging Dg Abd Portable 1v  Result Date: 12/09/2017 CLINICAL DATA:  NG tube placement EXAM: PORTABLE ABDOMEN - 1 VIEW COMPARISON:  11/20/2017 FINDINGS: NG tube has been placed with the tip in the mid to distal stomach.  IMPRESSION: NG tube tip in the stomach. Electronically Signed   By: Rolm Baptise M.D.   On: 12/09/2017 13:33   CULTURES: 6/27 MRSA PCR >> neg 11/21/2017 sputum>> normal flora  ANTIBIOTICS: 11/18/2017 Unasyn>> 7/2 7/2 Cefotan >> 7/6 Switched to Zosyn plus vancomycin 7/19  SIGNIFICANT EVENTS: 11/18/2017 intubated Right cervical lymph node biopsy 7/8 >> metastatic papillary thyroid carcinoma  LINES/TUBES: 11/18/2017 endotracheal tube>> 6/27 OGT >> 6/27 Foley >>  DISCUSSION: This is a 62 year old who has suffered from a posterior circulation infarct and has had a stent placed in the vertebrobasilar system.  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  He also has a thyroid cancer which is encasing the trachea, which is likely invaded the trachea and was responsible for hemoptysis prior to admission.  We have attempted to determine the patient's desires as to the appropriate level of care, and unfortunately he is not sufficiently or consistently interactive enough to make his own wishes known in this setting.  ASSESSMENT / PLAN:  PULMONARY A: Vent dependent respiratory failure Possible airway compromise due to paratracheal/thyroid mass Pulmonary nodules, question metastatic  Minute ventilation is quite high this morning and I suspect that until we are able to identify the provocation for his fever high minute ventilation and treated we will have very little success and separating him from mechanical ventilation.  Continue pressure control ventilation for now.  CARDIOVASCULAR A:  Intermittent atrial fibrillation.  Continue full dose heparin.  Rate is controlled with a modest dose of Coreg.   Coronary artery disease.  The catheterization on 08/14/2016 showed a 99% left circumflex lesion.  Continue statin and Brilinta. Peripheral vascular disease Continue Crestor, carvedilol Continue heparin drip  Hypotension.  It should be noted that the patient is still 8 L below his admission weight.  He  received 5 L overnight.  So some component of his hypotension may have simply been due to overdiuresis.  This is not to discount the fact that he also has a fever and a history of coronary disease suggesting other provocations for his hypotension, but these may not be as severe component as the overdiuresis.     RENAL  A:   No acute renal issues   Thyroid Mass with possible associated airway compromise Diagnosis of papillary carcinoma established by biopsy during this admission.  Tracheostomy to secure a reliable airway was performed 7/18 so far without bleeding complication  GASTROINTESTINAL A:   History of GERD Nutrition P:   PPI as ordered We will need to place a feeding tube as his OG was lost at the time of tracheostomy.  HEMATOLOGIC A:   Chronic anticoagulation for atrial fibrillation Off anticoagulation for 3 weeks due to  hemoptysis Follow CBC on aspirin, Brilinta, heparin drip  INFECTIOUS A:   Fever, leukocytosis, focal glucose controlled and high minute ventilation.  We have multiple potential complications including tongue necrosis, and infiltrate on chest x-ray, and the recent surgery as a potential noninfectious provocation.  Procalcitonin is not significantly elevated suggesting this is not a severe systemic infection however I think it would be prudent to broaden coverage especially since his sputum is now growing staph aureus.  I am going to switch him to Zosyn which should cover both the PreviDent she and his sputum and any potential anaerobes in his tongue and add vancomycin.    ENDOCRINE Diabetes mellitus - ongoing hyperglycemia  P:   Continues IV sliding scale insulin   PAD Patient had a cool right foot which resolved with removing his right femoral sheath.    NEUROLOGIC A:   Multiple strokes, initially received TPA and then underwent basilar artery thrombectomy and stent placement Appreciate neurology management.  Poor prognosis for neurological  recovery Brilinta, aspirin  Greater than 32 minutes was spent in the care of this unstable patient today.  Aware that he is now a DNR.  Lars Masson, MD. Critical Care Medicine. Pager: [phone]. After hours pager: [phone].  12/10/2017, 7:59 AM

## 2017-12-10 NOTE — Progress Notes (Signed)
Pharmacy Antibiotic Note  [name] [name] is a 62 y.o. male admitted on 11/18/2017 with pneumonia.  Pharmacy has been consulted for Vancomycin and Zosyn dosing.  Plan: D/c Cefepime Start Zosyn 3.375 gm IV q8hr Vancomycin 2500 mg IV x1, then Vancomycin 1250 mg IV every 12 hours.  Goal trough 15-20 mcg/mL.  Will monitor renal function and C&S. Vancomycin trough when at steady state.  Height: 5\' 10"  (177.8 cm) Weight: (!) 303 lb 12.7 oz (137.8 kg) IBW/kg (Calculated) : 73  Temp (24hrs), Avg:99.9 F (37.7 C), Min:98.5 F (36.9 C), Max:101.5 F (38.6 C)  Recent Labs  Lab 12/06/17 0228 12/07/17 0239 12/08/17 0508 12/09/17 0419 12/09/17 2014 12/10/17 0500  WBC 11.7* 10.7* 12.6* 15.9*  --  15.5*  CREATININE 0.81 0.70 0.70 0.52* 0.49* 0.46*    Estimated Creatinine Clearance: 135.6 mL/min (A) (by C-G formula based on SCr of 0.46 mg/dL (L)).    No Known Allergies  Antimicrobials: Zosyn 7/19 >>  Vanc 7/19 >>    Microbiology results: 7/15 TA: MRSA and Providencia rettgeri 7/18 TA: Staph aureas (susc pending)  Thank you for allowing pharmacy to be a part of this patient's care.  Corinda Gubler, PharmD, [facility] 12/10/2017 11:25 AM

## 2017-12-10 NOTE — Progress Notes (Signed)
CODE STATUS AND GOALS OF CARE CONVERSATION   Called to bedside earlier this evening for Hypotension As described in NP Simpson's note pt was managed and stabilized Contacted family and asked them to come in for a detailed conversation  Family members present were: POA ( Girlfriend), Brother and 2 sons, Daughter phoned in and was placed on speaker RN Abby present and NP Simpson Their Goal for the patient is to get him to a place where he can be weaned from the ventilator.   I explained in detail the patient's clinical condition system by system we discussed the active issues.  1. CVA- pontine, left cerebellar and Right MCA- s/p tpa and mechanical thrombectomy s/p stent on 7/1 on brilinta. Functional status: locked in ( pt communicates with blinking)  2. Papillary Thyroid Cancer with concern for metastatic disease 3. Ventilator Dept Respiratory Failure s/p tracheostomy 4. Septic Shock- on Vasopressors ( Levophed 29mcg)- low cortisol state continues on stress dose steroids 5. Paroxysmal Atrial Fibrillation 6. Diabetes   I explained that we will continue to medically manage the patient but that should his BP drop further there is no benefit to additional vasopressors. They understood and agreed with this plan of care.   I explained that the patient has a poor prognosis given the multiple issues and that we need to address what will be done in the event that the pt experiences cardiac arrest. The family had several questions which were answered. After much conversation they each verbalized that the pt should be DNR no compressions no ACLS medications.  The POA and Brother did ask about comfort care but the children are not ready for that step. We will continue to medically manage at this time.   Signed Dr Seward Carol Pulmonary Critical Care Locums

## 2017-12-10 NOTE — Progress Notes (Signed)
RT note: patient placed on CPAP/PSV of 18/8 at 1100.  Currently tolerating well.  Will continue to monitor.

## 2017-12-10 NOTE — Progress Notes (Signed)
STROKE TEAM PROGRESS NOTE   SUBJECTIVE (INTERVAL HISTORY) His RN is at the bedside. Pt no significant change neurologically. Had cortrak placed today. Overnight pt family made code changed to DNR.     OBJECTIVE Temp:  [98.5 F (36.9 C)-101.5 F (38.6 C)] 100.3 F (37.9 C) (07/19 0400) Pulse Rate:  [81-137] 84 (07/19 0700) Cardiac Rhythm: Atrial fibrillation (07/19 0400) Resp:  [17-35] 20 (07/19 0700) BP: (37-169)/(16-120) 143/110 (07/19 0700) SpO2:  [97 %-100 %] 100 % (07/19 0700) FiO2 (%):  [40 %] 40 % (07/19 0355) Weight:  [303 lb 12.7 oz (137.8 kg)] 303 lb 12.7 oz (137.8 kg) (07/19 0500)  CBC:  Recent Labs  Lab 12/09/17 0419  12/09/17 2014 12/10/17 0500  WBC 15.9*  --   --  15.5*  NEUTROABS 12.8*  --   --  13.6*  HGB 10.7*   < > 11.2* 10.8*  HCT 36.1*   < > 36.8* 35.2*  MCV 89.6  --   --  87.6  PLT 441*  --   --  412*   < > = values in this interval not displayed.    Basic Metabolic Panel:  Recent Labs  Lab 12/09/17 2014 12/10/17 0500  NA 140 140  K 3.8 3.6  CL 105 105  CO2 27 26  GLUCOSE 176* 160*  BUN 21 17  CREATININE 0.49* 0.46*  CALCIUM 8.8* 8.6*  MG 1.9 1.9  PHOS <1.0* 2.1*    Lipid Panel:     Component Value Date/Time   CHOL 110 11/19/2017 0437   CHOL 119 08/09/2017 1000   TRIG 340 (H) 11/19/2017 0437   HDL 19 (L) 11/19/2017 0437   HDL 29 (L) 08/09/2017 1000   CHOLHDL 5.8 11/19/2017 0437   VLDL 68 (H) 11/19/2017 0437   LDLCALC 23 11/19/2017 0437   LDLCALC 30 08/09/2017 1000   HgbA1c:  Lab Results  Component Value Date   HGBA1C 9.1 (H) 11/19/2017   Urine Drug Screen: No results found for: LABOPIA, COCAINSCRNUR, LABBENZ, AMPHETMU, THCU, LABBARB  Alcohol Level No results found for: [place] I have personally reviewed the radiological images below and agree with the radiology interpretations.  Ct Angio Head W Or Wo Contrast  Ct Angio Neck W Or Wo Contrast 11/18/2017 Since the study of earlier today, there are only 2 changes. There  is new consolidation and volume loss in the right upper lobe. 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 Flow is seen distal to that however, presumably indicating patent posterior communicating arteries.   Ct Angio Head W Or Wo Contrast Ct Angio Neck W Or Wo Contrast 11/18/2017 1. Severe intracranial and extracranial atherosclerotic disease. No emergent large vessel occlusion  2. Severe stenosis right carotid bifurcation due to calcified plaque, estimated 90% diameter stenosis. No significant left carotid stenosis. Severe stenosis cavernous carotid bilaterally.  3. Both vertebral arteries are patent in the neck with severe calcific stenosis V4 segment bilaterally.  4. 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 Small calcified lymph nodes left neck suspicious for metastatic disease  5. Small lung nodules on the right, possible metastatic disease. Recommend chest CT for further evaluation.   Ct Head Wo Contrast 11/19/2017 1. Expected evolution of brainstem infarct without hemorrhage.  2. No new infarcts.  3. Left vertebral artery and basilar artery stent.  4. Acute on chronic sinus disease may be related to intubation.   Ct Chest W Contrast 11/18/2017 Right upper lobe airspace opacity is noted  with air bronchograms concerning for pneumonia or possibly atelectasis. Stable mediastinal adenopathy is noted compared to prior exam. Endotracheal and nasogastric tubes are in grossly good position. Coronary artery calcifications are noted suggesting coronary artery disease. Possible 2.8 cm left thyroid nodule. Thyroid ultrasound is recommended for further evaluation. Aortic Atherosclerosis (ICD10-I70.0).   Mr Brain Wo Contrast 11/19/2017 1. 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 No associated hemorrhage or significant mass effect.  2. Additional punctate acute/early subacute infarctions  within left lateral cerebellum and right parietal cortex.  3. Minimal chronic microvascular ischemic changes of the brain and mild parenchymal volume loss.   Ct Head Code Stroke Wo Contrast 11/18/2017 1. No acute intracranial abnormality.  Atherosclerotic calcification  2. ASPECTS is 10   Cerebral Angiogram / Stent  11/18/2017 1. 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 Occluded non dominant RT VBJ. 2. S/P endovascular revascularization of the distal basilar artery  To 90 % patency , and of 30 to 40 % of the LT VBJ with stent placement. Dominant  Lt Pcom.  Transthoracic Echocardiogram - Left ventricle: The cavity size was moderately dilated. Wall thickness was increased in a pattern of severe LVH. Systolic function was normal. The estimated ejection fraction was in the range of 55% to 60%. Wall motion was normal; there were no regional wall motion abnormalities. The study is not technically sufficient to allow evaluation of LV diastolic function. - Aortic valve: Trileaflet. Sclerosis without stenosis. There was no regurgitation. Valve area (Vmax): 2.88 cm^2. - Aorta: Aortic root ML diameter: 41.95 mm (ED). - Aortic root: The aortic root is mildly dilated. - Mitral valve: Calcified annulus. There was trivial regurgitation. - Left atrium: The atrium was mildly dilated. - Right ventricle: The cavity size was mildly dilated. Mildly reduced systolic function. - Inferior vena cava: The vessel was normal in size. The respirophasic diameter changes were in the normal range (>= 50%), consistent with normal central venous pressure. Impressions:  Compared to a prior study in 02/2017, there have been no significant changes.  Ct Head Wo Contrast 11/24/2017 1. Continued normal expected interval evolution of brainstem infarct. No evidence for hemorrhagic transformation or other complication.  2. No other new intracranial abnormality.  3.  Moderate paranasal sinus disease with bilateral mastoid effusions, likely related intubation.   Dg Chest Port 1 View 11/25/2017 Mild vascular congestion. Tubes and lines as described.   Ct Soft Tissue Neck W Contrast 11/26/2017 Aside from the fact that the patient shows endotracheal intubation and the presence of an orogastric tube, and aside from development of bilateral layering pleural effusions with dependent atelectasis, the findings are unchanged since the previous studies. Irregular mass arising from the inferior left thyroid lobe, measuring approximately 5 x 3.4 x 5.5 cm, with invasion of the surrounding tissues worrisome for thyroid carcinoma. Partial encasement of the trachea. Broad surface along the esophagus. Small but likely pathologic nodes in the lower neck and superior mediastinum, some with calcification, probably involved by metastatic disease.   Dg Chest Port 1 View 11/29/2017 Tube and catheter positions as described without pneumothorax. There is pulmonary vascular congestion with interstitial edema and small pleural effusions. Suspect a degree of underlying congestive heart failure. No airspace consolidation.    Lymph node, needle/core biopsy - METASTATIC PAPILLARY THYROID CARCINOMA.   PHYSICAL EXAM General - morbid obesity,middle aged Caucasian male, intubated  on low dose sedation.  Ophthalmologic - fundi not visualized due to noncooperation.  Cardiovascular - irregularly irregular heart rate and rhythm, afib RVR.  Neuro - intubated on low dose sedation, eyes open, following commands with eye movement and eye close and opening, not blinking to visual threat bilaterally, PERRL. Weak corneal bilaterally, R>L. Positive gag. Facial symmetry not able to test due to ET tube, not able to move tongue or mouth on command. No movement on all extremities even with pain stimulation, DTR diminished and no babinski. Sensation, coordination and gait not tested.   ASSESSMENT/PLAN Mr.  [name] [name] is a 62 y.o. male with PMH of DM, hemoptysis, OSA, HTN, HLD, CAD, afib off Eliquis due to hemoptysis presenting with slurred speech, right sided weakness. Was intubated for hemoptysis prevention while giving tPA. Symptoms worsened and imaging confirmed BA occlusion. S/p IR with revascularization of BA as well as left VA s/p stenting.  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   MRI head - large acute/early subacute infarction within the central pons involving both sides, greater on the right.  CTA H&N - severe vascular athero including BA stenosis, b/l VA stenosis, right ICA proximal 90% stenosis, b/l cavernous ICA stenosis R>L.  Repeat CTA H/N - new distal BA occlusion  DSA - revascularization of the distal basilar artery to 90 % patency , and of 30 to 40 % of the LT VBJ with stent placement.  CT head 11/24/17 showed normal evolution of brainstem infarct, no acute change  2D Echo - EF 55-60%  LDL - 23  HgbA1c - 9.1  VTE prophylaxis - heparin subq  Eliquis (recently stopped) and plavix prior to admission, now on IV heparin drip.   Ongoing aggressive stroke risk factor management  Therapy recommendations:  pending  Disposition:  Pending  Palliative care on board  METASTATIC PAPILLARY THYROID CARCINOMA  CTA neck - right large thyroid mass compressing on trachea.   Thyroid mass larger than CT chest in 08/2016  Evidence of tracheal erosion on bronch at this admission  CT neck with contrast showed thyroid carcinoma, encasement of trachea.    Lymph node biopsy showed metastatic papillary thyroid  carcinoma   oncology Dr Learta Codding on board  Respiratory failure  CCM on board  Still on vent  Wean off sedation as able  S/p Trach this am   PAF  Currently in afib RVR with respiratory distress  On eliquis prior but off due to hemoptysis  On heparin IV now  Continue brilinta due to [place] stent  Coreg bid for rate control  Cerebral vascular athero  CTA head and neck showed right ICA proximal 90% stenosis, left ICA proximal athero, b/l cavernous ICA severe athro R>L, BA severe stenosis, b/l V4 severe athero with right V4 occlusion  S/p left V4 stent and BA revascularization  On brilinta and heparin IV  Hemoptysis, improved  Since 08/2016 as per family  Not sure about etiology  TB ruled out in the past  Likely tracheal erosion from right thyroid mass   CT neck showed thyroid carcinoma, encasement of trachea.   CCM on board  Consider tertiary center transfer for mass debulking or  tracheal resection  Sepsis  101.8->102.2->101.6->101.5  Leukocytosis WBC 10.7->12.6->15.9->15.5  Hypotension - on levophed  On cefepime -> zosyn and vanco now  CXR - bibasilar atelectasis, CHF  CHF  Off lasix   CXR showed persistent CHF  Off coreg due to hypotension  Gentle hydration  Hypotension  With maxed on levophed  Coreg and lasix discontinued . Long term BP goal normotensive.  Hyperlipidemia  Lipid lowering medication PTA: Crestor 10 mg daily  LDL 23, goal < 70  On Crestor  Continue statin at discharge  Diabetes  HgbA1c 9.1, goal < 7.0  Uncontrolled  continued on insulin drip  Continued hyperglycemia  CCM on board   CBG monitoring  Dysphagia   Put on cortrak  Resume TF after  Other Stroke Risk Factors  Advanced age  Former cigarette smoker - quit, 40 pack year history  Morbid obesity, Body mass index is 43.59 kg/m.    Hx stroke/TIA  Coronary artery disease   PAD with claudication  OSA on CPAP  Other Active  Problems  Palliative care on board  This patient is critically ill and at significant risk of neurological worsening, death and care requires constant monitoring of vital signs, hemodynamics,respiratory and cardiac monitoring, extensive review of multiple databases, frequent neurological assessment, discussion with family, other specialists and medical decision making of high complexity. I spent 35 minutes of neurocritical care time  in the care of  this patient.   Rosalin Hawking, MD PhD Stroke Neurology 12/10/2017 11:36 AM    To contact Stroke Continuity provider, please refer to [email or web address] After hours, contact General Neurology

## 2017-12-11 ENCOUNTER — Inpatient Hospital Stay (HOSPITAL_COMMUNITY): Payer: 59

## 2017-12-11 DIAGNOSIS — I651 Occlusion and stenosis of basilar artery: Secondary | ICD-10-CM

## 2017-12-11 LAB — BASIC METABOLIC PANEL
ANION GAP: 7 (ref 5–15)
BUN: 15 mg/dL (ref 8–23)
CHLORIDE: 108 mmol/L (ref 98–111)
CO2: 26 mmol/L (ref 22–32)
Calcium: 8.5 mg/dL — ABNORMAL LOW (ref 8.9–10.3)
Creatinine, Ser: 0.45 mg/dL — ABNORMAL LOW (ref 0.61–1.24)
GFR calc Af Amer: >60 mL/min (ref >60)
GFR calc non Af Amer: >60 mL/min (ref >60)
Glucose, Bld: 165 mg/dL — ABNORMAL HIGH (ref 70–99)
POTASSIUM: 3.2 mmol/L — AB (ref 3.5–5.1)
Sodium: 141 mmol/L (ref 135–145)

## 2017-12-11 LAB — CBC WITH DIFFERENTIAL/PLATELET
ABS IMMATURE GRANULOCYTES: 0.1 10*3/uL (ref 0.0–0.1)
Basophils Absolute: 0 10*3/uL (ref 0.0–0.1)
Basophils Relative: 0 %
Eosinophils Absolute: 0 10*3/uL (ref 0.0–0.7)
Eosinophils Relative: 0 %
HEMATOCRIT: 30.1 % — AB (ref 39.0–52.0)
Hemoglobin: 9.3 g/dL — ABNORMAL LOW (ref 13.0–17.0)
IMMATURE GRANULOCYTES: 1 %
LYMPHS ABS: 1.1 10*3/uL (ref 0.7–4.0)
Lymphocytes Relative: 14 %
MCH: 27.4 pg (ref 26.0–34.0)
MCHC: 30.9 g/dL (ref 30.0–36.0)
MCV: 88.8 fL (ref 78.0–100.0)
MONOS PCT: 6 %
Monocytes Absolute: 0.5 10*3/uL (ref 0.1–1.0)
Neutro Abs: 6 10*3/uL (ref 1.7–7.7)
Neutrophils Relative %: 79 %
Platelets: 283 10*3/uL (ref 150–400)
RBC: 3.39 MIL/uL — ABNORMAL LOW (ref 4.22–5.81)
RDW: 14 % (ref 11.5–15.5)
WBC: 7.6 10*3/uL (ref 4.0–10.5)

## 2017-12-11 LAB — GLUCOSE, CAPILLARY
GLUCOSE-CAPILLARY: 135 mg/dL — AB (ref 70–99)
GLUCOSE-CAPILLARY: 139 mg/dL — AB (ref 70–99)
GLUCOSE-CAPILLARY: 148 mg/dL — AB (ref 70–99)
GLUCOSE-CAPILLARY: 149 mg/dL — AB (ref 70–99)
GLUCOSE-CAPILLARY: 153 mg/dL — AB (ref 70–99)
GLUCOSE-CAPILLARY: 157 mg/dL — AB (ref 70–99)
GLUCOSE-CAPILLARY: 165 mg/dL — AB (ref 70–99)
GLUCOSE-CAPILLARY: 166 mg/dL — AB (ref 70–99)
GLUCOSE-CAPILLARY: 174 mg/dL — AB (ref 70–99)
GLUCOSE-CAPILLARY: 177 mg/dL — AB (ref 70–99)
GLUCOSE-CAPILLARY: 182 mg/dL — AB (ref 70–99)
GLUCOSE-CAPILLARY: 188 mg/dL — AB (ref 70–99)
GLUCOSE-CAPILLARY: 189 mg/dL — AB (ref 70–99)
Glucose-Capillary: 129 mg/dL — ABNORMAL HIGH (ref 70–99)
Glucose-Capillary: 161 mg/dL — ABNORMAL HIGH (ref 70–99)
Glucose-Capillary: 165 mg/dL — ABNORMAL HIGH (ref 70–99)
Glucose-Capillary: 185 mg/dL — ABNORMAL HIGH (ref 70–99)
Glucose-Capillary: 177 mg/dL — ABNORMAL HIGH (ref 70–99)
Glucose-Capillary: 170 mg/dL — ABNORMAL HIGH (ref 70–99)
Glucose-Capillary: 138 mg/dL — ABNORMAL HIGH (ref 70–99)
Glucose-Capillary: 160 mg/dL — ABNORMAL HIGH (ref 70–99)
Glucose-Capillary: 175 mg/dL — ABNORMAL HIGH (ref 70–99)

## 2017-12-11 LAB — BLOOD GAS, ARTERIAL
ACID-BASE EXCESS: 1.6 mmol/L (ref 0.0–2.0)
Bicarbonate: 25.4 mmol/L (ref 20.0–28.0)
DRAWN BY: 414221
FIO2: 40
O2 SAT: 98.6 %
PATIENT TEMPERATURE: 99.4
PEEP: 8 cmH2O
PH ART: 7.428 (ref 7.350–7.450)
PO2 ART: 119 mmHg — AB (ref 83.0–108.0)
Pressure control: 18 cmH2O
RATE: 20 resp/min
pCO2 arterial: 39.3 mmHg (ref 32.0–48.0)

## 2017-12-11 LAB — HEPARIN LEVEL (UNFRACTIONATED): Heparin Unfractionated: 0.3 IU/mL (ref 0.30–0.70)

## 2017-12-11 LAB — PROCALCITONIN: Procalcitonin: 0.11 ng/mL

## 2017-12-11 LAB — CULTURE, RESPIRATORY W GRAM STAIN

## 2017-12-11 LAB — CULTURE, RESPIRATORY: SPECIAL REQUESTS: NORMAL

## 2017-12-11 MED ORDER — HYDROCORTISONE NA SUCCINATE PF 100 MG IJ SOLR
25.0000 mg | Freq: Four times a day (QID) | INTRAMUSCULAR | Status: DC
  Administered 2017-12-11 – 2017-12-12 (×4): 25 mg via INTRAVENOUS
  Filled 2017-12-11 (×4): qty 2

## 2017-12-11 MED ORDER — POTASSIUM CHLORIDE 10 MEQ/100ML IV SOLN
10.0000 meq | INTRAVENOUS | Status: AC
  Administered 2017-12-11 (×4): 10 meq via INTRAVENOUS
  Filled 2017-12-11 (×3): qty 100

## 2017-12-11 NOTE — Progress Notes (Signed)
This RN observed as pt's significant other asked patient to move his head back and look up as far as possible. He did this twice and [name] [name] states this is the first time she has seen him do this.

## 2017-12-11 NOTE — Progress Notes (Signed)
STROKE TEAM PROGRESS NOTE   SUBJECTIVE (INTERVAL HISTORY) His RN and health power of attorney are at the bedside. Pt no significant change neurologically.  Family has not yet decided on PEG tube but patient is now DO NOT RESUSCITATE. OBJECTIVE Temp:  [98.7 F (37.1 C)-99.4 F (37.4 C)] 98.7 F (37.1 C) (07/20 0807) Pulse Rate:  [69-141] 96 (07/20 0700) Cardiac Rhythm: Atrial fibrillation (07/19 2000) Resp:  [0-39] 21 (07/20 0700) BP: (94-153)/(57-93) 115/74 (07/20 0700) SpO2:  [94 %-100 %] 100 % (07/20 1238) FiO2 (%):  [40 %] 40 % (07/20 1238) Weight:  [308 lb 13.8 oz (140.1 kg)] 308 lb 13.8 oz (140.1 kg) (07/20 0400)  CBC:  Recent Labs  Lab 12/10/17 0500 12/11/17 0429  WBC 15.5* 7.6  NEUTROABS 13.6* 6.0  HGB 10.8* 9.3*  HCT 35.2* 30.1*  MCV 87.6 88.8  PLT 412* 370    Basic Metabolic Panel:  Recent Labs  Lab 12/09/17 2014 12/10/17 0500 12/11/17 0429  NA 140 140 141  K 3.8 3.6 3.2*  CL 105 105 108  CO2 27 26 26   GLUCOSE 176* 160* 165*  BUN 21 17 15   CREATININE 0.49* 0.46* 0.45*  CALCIUM 8.8* 8.6* 8.5*  MG 1.9 1.9  --   PHOS <1.0* 2.1*  --     Lipid Panel:     Component Value Date/Time   CHOL 110 11/19/2017 0437   CHOL 119 08/09/2017 1000   TRIG 340 (H) 11/19/2017 0437   HDL 19 (L) 11/19/2017 0437   HDL 29 (L) 08/09/2017 1000   CHOLHDL 5.8 11/19/2017 0437   VLDL 68 (H) 11/19/2017 0437   LDLCALC 23 11/19/2017 0437   LDLCALC 30 08/09/2017 1000   HgbA1c:  Lab Results  Component Value Date   HGBA1C 9.1 (H) 11/19/2017   Urine Drug Screen: No results found for: LABOPIA, COCAINSCRNUR, LABBENZ, AMPHETMU, THCU, LABBARB  Alcohol Level No results found for: [place] I have personally reviewed the radiological images below and agree with the radiology interpretations.  Ct Angio Head W Or Wo Contrast  Ct Angio Neck W Or Wo Contrast 11/18/2017 Since the study of earlier today, there are only 2 changes. There is new consolidation and volume loss in the  right upper lobe. 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 Flow is seen distal to that however, presumably indicating patent posterior communicating arteries.   Ct Angio Head W Or Wo Contrast Ct Angio Neck W Or Wo Contrast 11/18/2017 1. Severe intracranial and extracranial atherosclerotic disease. No emergent large vessel occlusion  2. Severe stenosis right carotid bifurcation due to calcified plaque, estimated 90% diameter stenosis. No significant left carotid stenosis. Severe stenosis cavernous carotid bilaterally.  3. Both vertebral arteries are patent in the neck with severe calcific stenosis V4 segment bilaterally.  4. 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 Small calcified lymph nodes left neck suspicious for metastatic disease  5. Small lung nodules on the right, possible metastatic disease. Recommend chest CT for further evaluation.   Ct Head Wo Contrast 11/19/2017 1. Expected evolution of brainstem infarct without hemorrhage.  2. No new infarcts.  3. Left vertebral artery and basilar artery stent.  4. Acute on chronic sinus disease may be related to intubation.   Ct Chest W Contrast 11/18/2017 Right upper lobe airspace opacity is noted with air bronchograms concerning for pneumonia or possibly atelectasis. Stable mediastinal adenopathy is noted compared to prior exam. Endotracheal and nasogastric tubes are in grossly good position.  Coronary artery calcifications are noted suggesting coronary artery disease. Possible 2.8 cm left thyroid nodule. Thyroid ultrasound is recommended for further evaluation. Aortic Atherosclerosis (ICD10-I70.0).   Mr Brain Wo Contrast 11/19/2017 1. 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 No associated hemorrhage or significant mass effect.  2. Additional punctate acute/early subacute infarctions within left lateral cerebellum and right  parietal cortex.  3. Minimal chronic microvascular ischemic changes of the brain and mild parenchymal volume loss.   Ct Head Code Stroke Wo Contrast 11/18/2017 1. No acute intracranial abnormality.  Atherosclerotic calcification  2. ASPECTS is 10   Cerebral Angiogram / Stent  11/18/2017 1. 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 Occluded non dominant RT VBJ. 2. S/P endovascular revascularization of the distal basilar artery  To 90 % patency , and of 30 to 40 % of the LT VBJ with stent placement. Dominant  Lt Pcom.  Transthoracic Echocardiogram - Left ventricle: The cavity size was moderately dilated. Wall thickness was increased in a pattern of severe LVH. Systolic function was normal. The estimated ejection fraction was in the range of 55% to 60%. Wall motion was normal; there were no regional wall motion abnormalities. The study is not technically sufficient to allow evaluation of LV diastolic function. - Aortic valve: Trileaflet. Sclerosis without stenosis. There was no regurgitation. Valve area (Vmax): 2.88 cm^2. - Aorta: Aortic root ML diameter: 41.95 mm (ED). - Aortic root: The aortic root is mildly dilated. - Mitral valve: Calcified annulus. There was trivial regurgitation. - Left atrium: The atrium was mildly dilated. - Right ventricle: The cavity size was mildly dilated. Mildly reduced systolic function. - Inferior vena cava: The vessel was normal in size. The respirophasic diameter changes were in the normal range (>= 50%), consistent with normal central venous pressure. Impressions:  Compared to a prior study in 02/2017, there have been no significant changes.  Ct Head Wo Contrast 11/24/2017 1. Continued normal expected interval evolution of brainstem infarct. No evidence for hemorrhagic transformation or other complication.  2. No other new intracranial abnormality.  3. Moderate paranasal sinus disease with  bilateral mastoid effusions, likely related intubation.   Dg Chest Port 1 View 11/25/2017 Mild vascular congestion. Tubes and lines as described.   Ct Soft Tissue Neck W Contrast 11/26/2017 Aside from the fact that the patient shows endotracheal intubation and the presence of an orogastric tube, and aside from development of bilateral layering pleural effusions with dependent atelectasis, the findings are unchanged since the previous studies. Irregular mass arising from the inferior left thyroid lobe, measuring approximately 5 x 3.4 x 5.5 cm, with invasion of the surrounding tissues worrisome for thyroid carcinoma. Partial encasement of the trachea. Broad surface along the esophagus. Small but likely pathologic nodes in the lower neck and superior mediastinum, some with calcification, probably involved by metastatic disease.   Dg Chest Port 1 View 11/29/2017 Tube and catheter positions as described without pneumothorax. There is pulmonary vascular congestion with interstitial edema and small pleural effusions. Suspect a degree of underlying congestive heart failure. No airspace consolidation.    Lymph node, needle/core biopsy - METASTATIC PAPILLARY THYROID CARCINOMA.   PHYSICAL EXAM General - morbid obesity,middle aged 42 male, s/p traceostomy on low dose sedation.  Ophthalmologic - fundi not visualized due to noncooperation.  Cardiovascular - irregularly irregular heart rate and rhythm, afib RVR.  Neuro -  status post tracheostomy on low dose sedation, eyes open, following commands with vertical greater than horizontal eye movement and eye close and opening, not blinking to visual threat bilaterally, PERRL. Weak corneal bilaterally, R>L. Positive gag. Facial symmetry not able to test due to ET tube, not able to move tongue or mouth on command. No movement on all extremities even with pain stimulation, DTR diminished and no babinski. Sensation, coordination and gait not  tested.   ASSESSMENT/PLAN Mr. Alp Goldwater is a 62 y.o. male with PMH of DM, hemoptysis, OSA, HTN, HLD, CAD, afib off Eliquis due to hemoptysis presenting with slurred speech, right sided weakness. Was intubated for hemoptysis prevention while giving tPA. Symptoms worsened and imaging confirmed BA occlusion. S/p IR with revascularization of BA as well as left VA s/p stenting.  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   MRI head - large acute/early subacute infarction within the central pons involving both sides, greater on the right.  CTA H&N - severe vascular athero including BA stenosis, b/l VA stenosis, right ICA proximal 90% stenosis, b/l cavernous ICA stenosis R>L.  Repeat CTA H/N - new distal BA occlusion  DSA - revascularization of the distal basilar artery to 90 % patency , and of 30 to 40 % of the LT VBJ with stent placement.  CT head 11/24/17 showed normal evolution of brainstem infarct, no acute change  2D Echo - EF 55-60%  LDL - 23  HgbA1c - 9.1  VTE prophylaxis - heparin subq  Eliquis (recently stopped) and plavix prior to admission, now on IV heparin drip.   Ongoing aggressive stroke risk factor management  Therapy recommendations:  pending  Disposition:  Pending  Palliative care on board  METASTATIC PAPILLARY THYROID CARCINOMA  CTA neck - right large thyroid mass compressing on trachea.   Thyroid mass larger than CT chest in 08/2016  Evidence of tracheal erosion on bronch at this admission  CT neck with contrast showed thyroid carcinoma, encasement of trachea.    Lymph node biopsy  showed metastatic papillary thyroid carcinoma   oncology Dr Learta Codding on board  Respiratory failure  CCM on board  Still on vent  Wean off sedation as able  S/p Trach this am   PAF  Currently in afib RVR with respiratory distress  On eliquis prior but off due to hemoptysis  On heparin IV now  Continue brilinta due to [place] stent  Coreg bid for rate control  Cerebral vascular athero  CTA head and neck showed right ICA proximal 90% stenosis, left ICA proximal athero, b/l cavernous ICA severe athro R>L, BA severe stenosis, b/l V4 severe athero with right V4 occlusion  S/p left V4 stent and BA revascularization  On brilinta and heparin IV  Hemoptysis, improved  Since 08/2016 as per family  Not sure about etiology  TB ruled out in the past  Likely tracheal erosion from right thyroid mass   CT neck showed thyroid carcinoma, encasement of trachea.   CCM on board  Consider tertiary center transfer for mass debulking or tracheal resection  Sepsis  101.8->102.2->101.6->101.5  Leukocytosis WBC 10.7->12.6->15.9->15.5  Hypotension - on levophed  On cefepime -> zosyn and  vanco now  CXR - bibasilar atelectasis, CHF  CHF  Off lasix   CXR showed persistent CHF  Off coreg due to hypotension  Gentle hydration  Hypotension  With maxed on levophed  Coreg and lasix discontinued . Long term BP goal normotensive.  Hyperlipidemia  Lipid lowering medication PTA: Crestor 10 mg daily  LDL 23, goal < 70  On Crestor  Continue statin at discharge  Diabetes  HgbA1c 9.1, goal < 7.0  Uncontrolled  continued on insulin drip  Continued hyperglycemia  CCM on board   CBG monitoring  Dysphagia   Put on cortrak  Resume TF after  Other Stroke Risk Factors  Advanced age  Former cigarette smoker - quit, 40 pack year history  Morbid obesity, Body mass index is 44.32 kg/m.    Hx stroke/TIA  Coronary artery disease   PAD with claudication  OSA  on CPAP  Other Active Problems  Palliative care on board  I had a long discussion with the patient's health power of attorney at the bedside and answered questions about his care. Family needs to make a decision about moving forward with back to her changing him over to full comfort care and palliative care only. This patient is critically ill and at significant risk of neurological worsening, death and care requires constant monitoring of vital signs, hemodynamics,respiratory and cardiac monitoring, extensive review of multiple databases, frequent neurological assessment, discussion with family, other specialists and medical decision making of high complexity. I spent 38 minutes of neurocritical care time  in the care of  this patient.   Antony Contras, MD Stroke Neurology 12/11/2017 12:48 PM    To contact Stroke Continuity provider, please refer to [email or web address] After hours, contact General Neurology

## 2017-12-11 NOTE — Progress Notes (Signed)
Daily Progress Note   Patient Name: [name] [name]       Date: 12/11/2017 DOB: 10/26/1955  Age: 62 y.o. MRN#: [id number] Attending Physician: Rosalin Hawking, MD Primary Care Physician: Lorella Nimrod, MD Admit Date: 11/18/2017  Reason for Consultation/Follow-up: Establishing goals of care and Psychosocial/spiritual support  Subjective: Patient seen, chart reviewed.  Met with patient significant other, [name].  Since my last meeting with family they have  moved  towards a DNR.  Ms. Valentino Saxon tells me that she has spoken to neurology in preparation for further goals of care decision specifically insertion of a PEG with probable transition to [facility] versus comfort care and liberation from the vent.  Most of patient's family, including his 3 children, his brother, have returned to their homes which are out of town.  Ms. Valentino Saxon is communicating with them via a group text.  They are hoping to have this weekend to finalize a decision regarding comfort care versus PEG insertion and proceed with LTACH  Length of Stay: 23  Current Medications: Scheduled Meds:  . chlorhexidine gluconate (MEDLINE KIT)  15 mL Mouth Rinse BID  . Chlorhexidine Gluconate Cloth  6 each Topical Q0600  . docusate  100 mg Oral BID  . feeding supplement (PRO-STAT SUGAR FREE 64)  30 mL Per Tube TID  . hydrocortisone sodium succinate  25 mg Intravenous Q6H  . ipratropium-albuterol  3 mL Nebulization TID  . mouth rinse  15 mL Mouth Rinse 10 times per day  . pantoprazole sodium  40 mg Per Tube Daily  . rosuvastatin  20 mg Per Tube q1800  . sodium chloride flush  10-40 mL Intracatheter Q12H  . ticagrelor  90 mg Oral BID    Continuous Infusions: . sodium chloride 10 mL/hr at 12/10/17 0800  . 0.9 % NaCl with KCl 20 mEq / L  125 mL/hr at 12/11/17 1132  . dexmedetomidine (PRECEDEX) IV infusion Stopped (12/11/17 1305)  . feeding supplement (VITAL HIGH PROTEIN) 1,000 mL (12/11/17 1430)  . fentaNYL infusion INTRAVENOUS 25 mcg/hr (12/11/17 1000)  . heparin 2,200 Units/hr (12/11/17 0854)  . insulin (NOVOLIN-R) infusion 3.1 Units/hr (12/11/17 1419)  . norepinephrine (LEVOPHED) Adult infusion 2.5 mcg/min (12/11/17 0316)  . piperacillin-tazobactam (ZOSYN)  IV 3.375 g (12/11/17 1244)  . potassium chloride 10 mEq (12/11/17 1413)  . sodium chloride  10 mL/hr at 12/08/17 2036  . vancomycin 1,250 mg (12/11/17 1300)    PRN Meds: sodium chloride, acetaminophen **OR** acetaminophen (TYLENOL) oral liquid 160 mg/5 mL **OR** acetaminophen, bisacodyl, fentaNYL, fentaNYL (SUBLIMAZE) injection, lidocaine (PF), metoprolol tartrate, midazolam, sodium chloride flush  Physical Exam  Constitutional: He appears well-developed and well-nourished.  Acutely ill-appearing older man seen in ICU.  HENT:  Head: Normocephalic and atraumatic.  Cardiovascular: Normal rate.  Pulmonary/Chest:  Tracheostomy with ventilator support  Neurological:  Minimally responsive however he has been communicating with the aid of a eye gaze device  Skin: Skin is warm and dry. There is pallor.  Psychiatric:  No overt agitation otherwise unable to test  Nursing note and vitals reviewed.           Vital Signs: BP 100/62   Pulse 98   Temp 98.7 F (37.1 C) (Axillary)   Resp 16   Ht 5' 10"  (1.778 m)   Wt (!) 140.1 kg (308 lb 13.8 oz)   SpO2 96%   BMI 44.32 kg/m  SpO2: SpO2: 96 % O2 Device: O2 Device: Ventilator O2 Flow Rate:    Intake/output summary:   Intake/Output Summary (Last 24 hours) at 12/11/2017 1440 Last data filed at 12/11/2017 1321 Gross per 24 hour  Intake 3107.56 ml  Output 1585 ml  Net 1522.56 ml   LBM: Last BM Date: 12/10/17 Baseline Weight: Weight: (!) 141.6 kg (312 lb 2.7 oz) Most recent weight: Weight: (!) 140.1 kg (308 lb  13.8 oz)       Palliative Assessment/Data:    Flowsheet Rows     Most Recent Value  Intake Tab  Referral Department  Critical care  Unit at Time of Referral  ICU  Palliative Care Primary Diagnosis  Neurology  Date Notified  12/02/17  Palliative Care Type  New Palliative care  Reason for referral  Clarify Goals of Care  Date of Admission  11/18/17  Date first seen by Palliative Care  12/03/17  # of days Palliative referral response time  1 Day(s)  # of days IP prior to Palliative referral  14  Clinical Assessment  Palliative Performance Scale Score  30%  Pain Max last 24 hours  Not able to report  Pain Min Last 24 hours  Not able to report  Dyspnea Max Last 24 Hours  Not able to report  Dyspnea Min Last 24 hours  Not able to report  Nausea Max Last 24 Hours  Not able to report  Nausea Min Last 24 Hours  Not able to report  Anxiety Max Last 24 Hours  Not able to report  Anxiety Min Last 24 Hours  Not able to report  Other Max Last 24 Hours  Not able to report  Psychosocial & Spiritual Assessment  Palliative Care Outcomes  Patient/Family meeting held?  Yes  Who was at the meeting?  SO, 3 children, 2 step sons, DIL, brother  Palliative Care follow-up planned  Yes, Facility      Patient Active Problem List   Diagnosis Date Noted  . Leukocytosis   . Tracheostomy care ([place])   . Ventilator dependence ([place])   . Fever   . Hyperglycemia   . DNR (do not resuscitate) discussion   . Palliative care by specialist   . Thyroid mass   . Stroke (cerebrum) ([place]) 11/18/2017  . 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 Acute respiratory failure ([place])   . Claudication in peripheral vascular disease ([place]) 04/05/2017  . Hyperlipidemia LDL goal <70   .  Diabetes mellitus due to underlying condition with unspecified complications ([place]) 88/91/6945  . PAF (paroxysmal atrial fibrillation) ([place]) 02/22/2017  . History of smoking 02/22/2017  . Atrial fibrillation with RVR ([place]) 02/19/2017  .  Upper airway cough syndrome 01/15/2017  . CAD (coronary artery disease) 09/16/2016  . Essential hypertension 09/11/2016  . Weight loss   . Night sweats   . OSA (obstructive sleep apnea)   . Morbid obesity due to excess calories ([place])   . Lung nodules   . Hemoptysis 08/23/2016    Palliative Care Assessment & Plan   Patient Profile: 62 y.o.malewith past medical history of HTN, DM2, a-fib ( off AC 2/2 hemoptysis), hemoptysis, HLDadmitted on 6/27/2019with left sided weakness, slurred speech. He was given TPA and referred for a cerebral arteriogram. He underwent endovascular revascularization of the distal basilar artery on 11/18/2017. He was intubated for airway protection in the setting of hemoptysis. There was evidence of bleeding and erosion at 3 cm below the vocal cords.  His stroke symptoms progressed in the hospital with resultant quadriplegia, a 6th nerve palsy, and 9 nystagmus. 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 An MRI of the brain revealed an acute/subacute infarct in the central pons.  He was taken off of Eliquis prior to hospital admission secondary to hemoptysis.  A CT of the neck on 11/18/2017 revealed a 2.5 x 4 cm left thyroid mass with small calcified left neck lymph node suspicious for metastatic disease. A CT of the chest on 11/18/2017 revealed right upper lobe airspace disease, mediastinal adenopathy, and a 2.8 cm left thyroid nodule.  He was referred for an ultrasound-guided biopsy of a right cervical lymph node on 11/29/2017. The pathology revealed metastatic papillary thyroid carcinoma.  He remains intubated. He is not moving, but is able to blink his eyes to command.  Consult orderd for [place]. Family mtg 12/03/17 as well as conversation with pt. patient underwent a tracheostomy on 12/09/2017.  Further discussions regarding patient's new thyroid cancer diagnosis have been discussed with Dr. Benay Spice Family currently trying to decide on placement of a PEG and  proceeding with LTAC versus comfort care    Recommendations/Plan:  Continue current plan of care for now  Family is aware that decisions need to be made regarding placement of PEG and proceeding with LTACH or proceeding with liberation from the vent, comfort care.  They are asking for the weekend for all parties to come together and make this decision.  Patient's daughter who has been struggling with letting the ventilator and other aggressive measures go, is arriving in town on Tuesday, 23rd, 2019  Family is still hopeful that patient can participate in goals of care discussion particularly now that he has a trach and is using an eye gaze device to communicate.  It would be helpful for palliative medicine provider , physician and speech therapy, the first of the week, to attempt to communicate with patient what his wishes would be regarding further aggressive measures e.g.PEG versus comfort care  Palliative medicine to stay involved and will help with organizing a meeting the first of the week to determine PEG placement/LTAC versus one-way extubation/comfort care  Goals of Care and Additional Recommendations:  Limitations on Scope of Treatment: Full Scope Treatment  Code Status:    Code Status Orders  (From admission, onward)        Start     Ordered   12/09/17 2144  Limited resuscitation (code)  Continuous    Question Answer Comment  In the  event of cardiac or respiratory ARREST: Initiate Code Blue, Call Rapid Response No   In the event of cardiac or respiratory ARREST: Perform CPR No   In the event of cardiac or respiratory ARREST: Perform Intubation/Mechanical Ventilation Yes   In the event of cardiac or respiratory ARREST: Use NIPPV/BiPAp only if indicated No   In the event of cardiac or respiratory ARREST: Administer ACLS medications if indicated Yes   In the event of cardiac or respiratory ARREST: Perform Defibrillation or Cardioversion if indicated No   Comments continue  current vasopressors and mechanical ventilation.  No CPR      12/09/17 2143    Code Status History    Date Active Date Inactive Code Status Order ID Comments User Context   11/18/2017 1826 12/09/2017 2143 Full Code [id number]  Luanne Bras, MD Inpatient   11/18/2017 1101 11/18/2017 1826 Full Code [id number]  Greta Doom, MD ED   04/01/2017 1318 04/02/2017 1507 Full Code [id number]  Lorretta Harp, MD Inpatient   08/23/2016 2015 08/26/2016 1949 Full Code [id number]  Milagros Loll, MD ED       Prognosis:   Unable to determine but if he were to be liberated from the vent his prognosis would likely be only hours to days  Discharge Planning:  Anticipated Hospital Death if liberated from the vent; otherwise proceed with PEG and LTACH   Thank you for allowing the Palliative Medicine Team to assist in the care of this patient.   Time In: 1100 Time Out: 1135 Total Time 35 min Prolonged Time Billed  no       Greater than 50%  of this time was spent counseling and coordinating care related to the above assessment and plan.  Dory Horn, NP  Please contact Palliative Medicine Team phone at [phone] for questions and concerns.

## 2017-12-11 NOTE — Progress Notes (Signed)
ANTICOAGULATION CONSULT NOTE - Initial Consult  Pharmacy Consult for heparin Indication: atrial fibrillation and stroke  No Known Allergies  Patient Measurements: Height: 5\' 10"  (177.8 cm) Weight: (!) 308 lb 13.8 oz (140.1 kg) IBW/kg (Calculated) : 73  Vital Signs: Temp: 98.7 F (37.1 C) (07/20 0807) Temp Source: Axillary (07/20 0807) BP: 115/74 (07/20 0700) Pulse Rate: 96 (07/20 0700)  Labs: Recent Labs    12/09/17 0419 12/09/17 0734  12/09/17 2014 12/10/17 0500 12/11/17 0429  HGB 10.7*  --    < > 11.2* 10.8* 9.3*  HCT 36.1*  --    < > 36.8* 35.2* 30.1*  PLT 441*  --   --   --  412* 283  APTT  --  33  --   --   --   --   HEPARINUNFRC 0.45  --   --   --  0.32 0.30  CREATININE 0.52*  --   --  0.49* 0.46* 0.45*   < > = values in this interval not displayed.    Estimated Creatinine Clearance: 136.9 mL/min (A) (by C-G formula based on SCr of 0.45 mg/dL (L)).  Assessment: CC/HPI: stroke  PMH: mass on thyroid that is pushing on esophagus and causing hemoptysis  Anticoag: On Eliquis for afib PTA, but stopped 3 wks before admission 2/2 to bleed. Now on heparin. Hep within goal 0.3  Renal: SCr 0.45  Heme/Onc: H&H 9.3/30.1, Plt 283  Goal of Therapy:  Heparin level 0.3 - 0.5 units/ml Monitor platelets by anticoagulation protocol: Yes   Plan:  Heparin 2200 units/hr Daily HL CBC  Levester Fresh, PharmD, BCPS, BCCCP Clinical Pharmacist [phone]  Please check AMION for all [place] numbers  12/11/2017 1:11 PM

## 2017-12-11 NOTE — Progress Notes (Signed)
PULMONARY / CRITICAL CARE MEDICINE   Name: [name] [name] MRN: [id number] DOB: 1956-02-17    ADMISSION DATE:  11/18/2017 CONSULTATION DATE: 11/18/2017  REFERRING MD: Emergency department  CHIEF COMPLAINT: Stroke  HISTORY OF PRESENT ILLNESS:    [name] [name]  is a 62 year old man with atrial fibrillation, formally on anticoagulation was stopped due to recent hemoptysis.  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  He presented with an acute stroke 6/27, was intubated and potential tracheal erosion site covered with cuff balloon to facilitate TPA.  Unfortunately during the hospitalization is also experienced a second stroke and underwent mechanical thrombectomy, stent placement 7/1.  SUBJECTIVE: Tracheostomy was performed 7/18.  There was no unusual intraoperative bleeding.  He was  hypotensive and febrile the night of 7/18 and required the addition of pressors and stress dose steroids as well as external cooling. Today his pressor requirement is down substantially, he is no longer requiring TTM and his insulin requirement is down substantially. Yesterday afternoon he seemed to be much more alert and was correctly indicating yes or no with eye movements. He continues to be alert this morning and is interacting with eye movements.  He develops ventilator asynchrony when his Precedex is decreased below 0.3 mcg/min       VITAL SIGNS: BP 115/74   Pulse 96   Temp 99.4 F (37.4 C) (Axillary)   Resp (!) 21   Ht 5\' 10"  (1.778 m)   Wt (!) 308 lb 13.8 oz (140.1 kg)   SpO2 99%   BMI 44.32 kg/m   HEMODYNAMICS:    VENTILATOR SETTINGS: Vent Mode: PCV FiO2 (%):  [40 %] 40 % Set Rate:  [20 bmp] 20 bmp PEEP:  [8 cmH20] 8 cmH20 Pressure Support:  [18 cmH20] 18 cmH20 Plateau Pressure:  [19 cmH20-25 cmH20] 21 cmH20  INTAKE / OUTPUT: I/O last 3 completed shifts: In: 8134.1 [I.V.:6768.6; NG/GT:170; IV Piggyback:1195.5] Out: 2135 [Urine:2135]  PHYSICAL  EXAMINATION: General: Obese middle-aged male who is mechanically ventilated via tracheostomy.       HEENT: Tracheostomy site is dry.   Neuro: He has spontaneous eye opening and is correctly responding to questions by directing his gaze to the right 4 yes and to the left for now.  He has no limb movement.   CV: S1 and S2 are irregularly irregular without murmur rub or gallop     PULM: Respirations are unlabored on pressure control ventilation with a delta P of 20 .  Minute volume remains elevated at 14 L/min.  There is symmetric air movement, no wheezes      GI: The abdomen is obese soft and nontender. Extremities: 1+ anasarca Skin: No rash  Recent Labs  Lab 12/09/17 2014 12/10/17 0500 12/11/17 0429  NA 140 140 141  K 3.8 3.6 3.2*  CL 105 105 108  CO2 27 26 26   BUN 21 17 15   CREATININE 0.49* 0.46* 0.45*  GLUCOSE 176* 160* 165*   Electrolytes Recent Labs  Lab 12/06/17 0228  12/09/17 2014 12/10/17 0500 12/11/17 0429  CALCIUM 10.7*   < > 8.8* 8.6* 8.5*  MG 2.2  --  1.9 1.9  --   PHOS 4.2  --  <1.0* 2.1*  --    < > = values in this interval not displayed.   CBC Recent Labs  Lab 12/09/17 0419  12/09/17 2014 12/10/17 0500 12/11/17 0429  WBC 15.9*  --   --  15.5* 7.6  HGB 10.7*   < >  11.2* 10.8* 9.3*  HCT 36.1*   < > 36.8* 35.2* 30.1*  PLT 441*  --   --  412* 283   < > = values in this interval not displayed.   Coag's Recent Labs  Lab 12/09/17 0734  APTT 33    Sepsis Markers Recent Labs  Lab 12/09/17 1217 12/10/17 0500 12/11/17 0429  PROCALCITON 0.12 0.15 0.11    ABG Recent Labs  Lab 12/09/17 0435 12/09/17 0840 12/11/17 0334  PHART 7.405 7.455* 7.428  PCO2ART 48.7* 43.1 39.3  PO2ART 88.8 53.0* 119*   Liver Enzymes Recent Labs  Lab 12/09/17 2014 12/10/17 0500  ALBUMIN 2.2* 2.1*   Cardiac Enzymes No results for input(s): TROPONINI, PROBNP in the last 168 hours.  Glucose Recent Labs  Lab 12/10/17 1858 12/10/17 1958 12/10/17 2048  12/10/17 2140 12/10/17 2246 12/10/17 2353  GLUCAP 195* 179* 159* 169* 150* 139*   Imaging No results found. 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  He also has a thyroid cancer which is encasing the trachea, which has likely invaded the trachea and was responsible for hemoptysis prior to admission.  We have attempted to determine the patient's desires as to the appropriate level of care, but I was concerned about the consistency of his responses previously.  His mental status is clearing and he is more consistently responding appropriately to simple questions.  We may soon be able to elucidate his desires in this situation   ASSESSMENT / PLAN:  PULMONARY A: Vent dependent respiratory failure Possible airway compromise due to paratracheal/thyroid mass Pulmonary nodules, question metastatic  Minute ventilation remains quite high, he has been febrile with a white count, and he has evolved the right lower lobe infiltrate.  Unfortunately I suspect we are dealing with a Vapne despite his normal procalcitonin.  I am going to continue him on a combination of vancomycin and Zosyn for the Providencia and MRSA growing from his sputum.  Continue pressure control ventilation for now.  CARDIOVASCULAR A:  Intermittent atrial fibrillation.  Continue full dose heparin.  Rate is controlled with a modest dose of Coreg.   Coronary artery disease.  A catheterization on 08/14/2016 showed a 99% left  circumflex lesion.  Continue statin and Brilinta. Peripheral vascular disease Continue Crestor, carvedilol Continue heparin drip  Hypotension.  Patient is still below his admission weight and although there was likely a component of overdiuresis to his hypotension, he was also febrile and has evolved a right lower lobe infiltrate.  I am going to continue him on vancomycin and cefepime for now, begin tapering his stress dose steroids.       RENAL  A:   No acute renal issues   Thyroid Mass with possible associated airway compromise Diagnosis of papillary carcinoma established by biopsy during this admission.  Tracheostomy to secure a reliable airway was performed 7/18 so far without bleeding complication  GASTROINTESTINAL A:   History of GERD Nutrition P:   PPI as ordered HEMATOLOGIC A:   Chronic anticoagulation for atrial fibrillation Off anticoagulation for 3  weeks due to hemoptysis Follow CBC on aspirin, Brilinta, heparin drip  INFECTIOUS A:   Fever, leukocytosis.  Right lower lobe infiltrate and tongue necrosis are both considerations as to a source.  Tenuous vancomycin and Zosyn  ENDOCRINE Diabetes mellitus - ongoing hyperglycemia much improved. P:   Continues IV sliding scale insulin   PAD Patient had a cool right foot which resolved with removing his right femoral sheath.    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  Greater than 32 minutes was spent in the care of this unstable patient today.  Aware that he is now a DNR.  [name] Masson, MD. Critical Care Medicine. Pager: [phone]. After hours pager: [phone].  12/11/2017, 8:00 AM

## 2017-12-12 ENCOUNTER — Inpatient Hospital Stay (HOSPITAL_COMMUNITY): Payer: 59

## 2017-12-12 LAB — CBC
HCT: 29 % — ABNORMAL LOW (ref 39.0–52.0)
Hemoglobin: 8.8 g/dL — ABNORMAL LOW (ref 13.0–17.0)
MCH: 27.1 pg (ref 26.0–34.0)
MCHC: 30.3 g/dL (ref 30.0–36.0)
MCV: 89.2 fL (ref 78.0–100.0)
Platelets: 242 10*3/uL (ref 150–400)
RBC: 3.25 MIL/uL — AB (ref 4.22–5.81)
RDW: 13.9 % (ref 11.5–15.5)
WBC: 6.4 10*3/uL (ref 4.0–10.5)

## 2017-12-12 LAB — GLUCOSE, CAPILLARY
GLUCOSE-CAPILLARY: 137 mg/dL — AB (ref 70–99)
GLUCOSE-CAPILLARY: 146 mg/dL — AB (ref 70–99)
GLUCOSE-CAPILLARY: 151 mg/dL — AB (ref 70–99)
GLUCOSE-CAPILLARY: 164 mg/dL — AB (ref 70–99)
GLUCOSE-CAPILLARY: 175 mg/dL — AB (ref 70–99)
GLUCOSE-CAPILLARY: 202 mg/dL — AB (ref 70–99)
Glucose-Capillary: 141 mg/dL — ABNORMAL HIGH (ref 70–99)
Glucose-Capillary: 145 mg/dL — ABNORMAL HIGH (ref 70–99)
Glucose-Capillary: 129 mg/dL — ABNORMAL HIGH (ref 70–99)
Glucose-Capillary: 126 mg/dL — ABNORMAL HIGH (ref 70–99)
Glucose-Capillary: 121 mg/dL — ABNORMAL HIGH (ref 70–99)
Glucose-Capillary: 178 mg/dL — ABNORMAL HIGH (ref 70–99)
Glucose-Capillary: 157 mg/dL — ABNORMAL HIGH (ref 70–99)
Glucose-Capillary: 210 mg/dL — ABNORMAL HIGH (ref 70–99)

## 2017-12-12 LAB — BASIC METABOLIC PANEL
ANION GAP: 6 (ref 5–15)
BUN: 13 mg/dL (ref 8–23)
CO2: 25 mmol/L (ref 22–32)
Calcium: 8.7 mg/dL — ABNORMAL LOW (ref 8.9–10.3)
Chloride: 111 mmol/L (ref 98–111)
Creatinine, Ser: 0.48 mg/dL — ABNORMAL LOW (ref 0.61–1.24)
GFR calc Af Amer: >60 mL/min (ref >60)
GLUCOSE: 144 mg/dL — AB (ref 70–99)
Potassium: 3.2 mmol/L — ABNORMAL LOW (ref 3.5–5.1)
Sodium: 142 mmol/L (ref 135–145)

## 2017-12-12 LAB — HEPARIN LEVEL (UNFRACTIONATED)
Heparin Unfractionated: 0.24 IU/mL — ABNORMAL LOW (ref 0.30–0.70)
Heparin Unfractionated: 0.19 IU/mL — ABNORMAL LOW (ref 0.30–0.70)

## 2017-12-12 MED ORDER — INSULIN ASPART 100 UNIT/ML ~~LOC~~ SOLN
0.0000 [IU] | Freq: Three times a day (TID) | SUBCUTANEOUS | Status: DC
  Administered 2017-12-12 (×2): 3 [IU] via SUBCUTANEOUS

## 2017-12-12 MED ORDER — IPRATROPIUM-ALBUTEROL 0.5-2.5 (3) MG/3ML IN SOLN
3.0000 mL | Freq: Four times a day (QID) | RESPIRATORY_TRACT | Status: DC
  Administered 2017-12-12 – 2017-12-13 (×5): 3 mL via RESPIRATORY_TRACT
  Filled 2017-12-12 (×4): qty 3

## 2017-12-12 MED ORDER — METOPROLOL TARTRATE 5 MG/5ML IV SOLN
INTRAVENOUS | Status: AC
  Filled 2017-12-12: qty 5

## 2017-12-12 MED ORDER — ASPIRIN 81 MG PO CHEW
81.0000 mg | CHEWABLE_TABLET | Freq: Every day | ORAL | Status: DC
  Administered 2017-12-12: 81 mg via ORAL
  Filled 2017-12-12: qty 1

## 2017-12-12 MED ORDER — ASPIRIN 81 MG PO CHEW
81.0000 mg | CHEWABLE_TABLET | Freq: Every day | ORAL | Status: DC
  Filled 2017-12-12: qty 1

## 2017-12-12 MED ORDER — METOPROLOL TARTRATE 5 MG/5ML IV SOLN
2.5000 mg | Freq: Once | INTRAVENOUS | Status: AC
  Administered 2017-12-12: 2.5 mg via INTRAVENOUS

## 2017-12-12 MED ORDER — INSULIN ASPART 100 UNIT/ML ~~LOC~~ SOLN: 1.0000 [IU] | SUBCUTANEOUS | Status: DC

## 2017-12-12 MED ORDER — INSULIN GLARGINE 100 UNIT/ML ~~LOC~~ SOLN
8.0000 [IU] | Freq: Every day | SUBCUTANEOUS | Status: DC
  Administered 2017-12-12 – 2017-12-14 (×3): 8 [IU] via SUBCUTANEOUS
  Filled 2017-12-12 (×4): qty 0.08

## 2017-12-12 MED ORDER — INSULIN ASPART 100 UNIT/ML ~~LOC~~ SOLN
3.0000 [IU] | SUBCUTANEOUS | Status: DC
Start: 2017-12-12 — End: 2017-12-13
  Administered 2017-12-12 (×2): 9 [IU] via SUBCUTANEOUS
  Administered 2017-12-13 (×2): 3 [IU] via SUBCUTANEOUS

## 2017-12-12 MED ORDER — HYDROCORTISONE NA SUCCINATE PF 100 MG IJ SOLR
12.5000 mg | Freq: Four times a day (QID) | INTRAMUSCULAR | Status: DC
  Administered 2017-12-12 – 2017-12-13 (×5): 12.5 mg via INTRAVENOUS
  Filled 2017-12-12 (×5): qty 2

## 2017-12-12 NOTE — Progress Notes (Signed)
[facility] for heparin Indication: atrial fibrillation and stroke  No Known Allergies  Patient Measurements: Height: 5\' 10"  (177.8 cm) Weight: (!) 317 lb 3.9 oz (143.9 kg) IBW/kg (Calculated) : 73  Vital Signs: Temp: 100.5 F (38.1 C) (07/21 1600) Temp Source: Axillary (07/21 1600) BP: 113/64 (07/21 2030) Pulse Rate: 111 (07/21 2030)  Labs: Recent Labs    12/10/17 0500 12/11/17 0429 12/12/17 0544 12/12/17 1956  HGB 10.8* 9.3* 8.8*  --   HCT 35.2* 30.1* 29.0*  --   PLT 412* 283 242  --   HEPARINUNFRC 0.32 0.30 0.24* 0.19*  CREATININE 0.46* 0.45* 0.48*  --     Estimated Creatinine Clearance: 139.1 mL/min (A) (by C-G formula based on SCr of 0.48 mg/dL (L)).  Assessment: CC/HPI: stroke  PMH: mass on thyroid that is pushing on esophagus and causing hemoptysis  Anticoag: On Eliquis for afib PTA, but stopped 3 wks before admission 2/2 to bleed. Now on heparin. Hep low at 019 (heparin not increased until 1730 pm to 2400 units / hr)  Renal: SCr 0.45  Heme/Onc: H&H 8.8/29, Plt 242  Goal of Therapy:  Heparin level 0.3 - 0.5 units/ml Monitor platelets by anticoagulation protocol: Yes   Plan:  Continue heparin at 2400 units / hr Heparin stops at 4 am  Thank you Anette Guarneri, PharmD [phone]  Please check AMION for all [facility] numbers  12/12/2017 9:19 PM

## 2017-12-12 NOTE — Progress Notes (Signed)
ANTICOAGULATION CONSULT NOTE - Initial Consult  Pharmacy Consult for heparin Indication: atrial fibrillation and stroke  No Known Allergies  Patient Measurements: Height: 5\' 10"  (177.8 cm) Weight: (!) 317 lb 3.9 oz (143.9 kg) IBW/kg (Calculated) : 73  Vital Signs: Temp: 100 F (37.8 C) (07/21 0800) Temp Source: Axillary (07/21 0800) BP: 111/76 (07/21 0700) Pulse Rate: 105 (07/21 0700)  Labs: Recent Labs    12/10/17 0500 12/11/17 0429 12/12/17 0544  HGB 10.8* 9.3* 8.8*  HCT 35.2* 30.1* 29.0*  PLT 412* 283 242  HEPARINUNFRC 0.32 0.30 0.24*  CREATININE 0.46* 0.45* 0.48*    Estimated Creatinine Clearance: 139.1 mL/min (A) (by C-G formula based on SCr of 0.48 mg/dL (L)).  Assessment: CC/HPI: stroke  PMH: mass on thyroid that is pushing on esophagus and causing hemoptysis  Anticoag: On Eliquis for afib PTA, but stopped 3 wks before admission 2/2 to bleed. Now on heparin. Hep low at 0.24  Renal: SCr 0.45  Heme/Onc: H&H 8.8/29, Plt 242  Goal of Therapy:  Heparin level 0.3 - 0.5 units/ml Monitor platelets by anticoagulation protocol: Yes   Plan:  Increase Heparin to 2400 units/hr Next lvl 1800 Daily HL CBC  Levester Fresh, PharmD, BCPS, BCCCP Clinical Pharmacist [phone]  Please check AMION for all [facility] numbers  12/12/2017 9:52 AM

## 2017-12-12 NOTE — Progress Notes (Signed)
PULMONARY / CRITICAL CARE MEDICINE   Name: [name] [name] MRN: [id number] DOB: 30-Nov-1955    ADMISSION DATE:  11/18/2017 CONSULTATION DATE: 11/18/2017  REFERRING MD: Emergency department  CHIEF COMPLAINT: Stroke  HISTORY OF PRESENT ILLNESS:    [name] [name]  is a 62 year old man with atrial fibrillation, formally on anticoagulation was stopped due to recent hemoptysis.  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  He presented with an acute stroke 6/27, was intubated and potential tracheal erosion site covered with cuff balloon to facilitate TPA.  Unfortunately during the hospitalization is also experienced a second stroke and underwent mechanical thrombectomy, stent placement 7/1.  SUBJECTIVE: Tracheostomy was performed 7/18.  There was no unusual intraoperative bleeding.  He was  hypotensive and febrile the night of 7/18 and required the addition of pressors and stress dose steroids as well as external cooling. He is off pressors this morning. He continues to be more alert and is consistently responding to questions with yes/no eye movements. He is aware that he has had a stroke and that he has thyroid cancer.       VITAL SIGNS: BP 111/76   Pulse (!) 105   Temp 98.3 F (36.8 C) (Axillary)   Resp (!) 29   Ht 5\' 10"  (1.778 m)   Wt (!) 317 lb 3.9 oz (143.9 kg)   SpO2 92%   BMI 45.52 kg/m   HEMODYNAMICS:    VENTILATOR SETTINGS: Vent Mode: PCV FiO2 (%):  [40 %] 40 % Set Rate:  [20 bmp] 20 bmp PEEP:  [8 cmH20] 8 cmH20 Plateau Pressure:  [20 cmH20-23 cmH20] 20 cmH20  INTAKE / OUTPUT: I/O last 3 completed shifts: In: 5073.2 [I.V.:3956.1; NG/GT:685; IV Piggyback:432.2] Out: 9390 [ZESPQ:3300; Emesis/NG output:50]  PHYSICAL EXAMINATION: General: Obese middle-aged male who is mechanically ventilated via tracheostomy.       HEENT: Tracheostomy site is dry, fetid breath has resolved.     Neuro: He has spontaneous eye opening and is  correctly responding to questions as indicated by eye movements to the right for yes and to the left for no.  He has no limb movement but is able to move his head up and down. CV: S1 and S2 are irregularly irregular without murmur rub or gallop      PULM: He continues to have a high minute volume requirement of approximately 14 L/min.  Respirations are unlabored on pressure control ventilation.  There is symmetric air movement lots of scattered rhonchi and a few scattered wheezes.        GI: The abdomen is obese soft and nontender. Extremities: 1+ anasarca Skin: No rash  Recent Labs  Lab 12/10/17 0500 12/11/17 0429 12/12/17 0544  NA 140 141 142  K 3.6 3.2* 3.2*  CL 105 108 111  CO2 26 26 25   BUN 17 15 13   CREATININE 0.46* 0.45* 0.48*  GLUCOSE 160* 165* 144*   Electrolytes Recent Labs  Lab 12/06/17 0228  12/09/17 2014 12/10/17 0500 12/11/17 0429 12/12/17 0544  CALCIUM 10.7*   < > 8.8* 8.6* 8.5* 8.7*  MG 2.2  --  1.9 1.9  --   --   PHOS 4.2  --  <1.0* 2.1*  --   --    < > = values in this interval not displayed.   CBC Recent Labs  Lab 12/10/17 0500 12/11/17 0429 12/12/17 0544  WBC 15.5* 7.6 6.4  HGB 10.8* 9.3* 8.8*  HCT 35.2* 30.1* 29.0*  PLT 412* 283 242  Coag's Recent Labs  Lab 12/09/17 0734  APTT 33    Sepsis Markers Recent Labs  Lab 12/09/17 1217 12/10/17 0500 12/11/17 0429  PROCALCITON 0.12 0.15 0.11    ABG Recent Labs  Lab 12/09/17 0435 12/09/17 0840 12/11/17 0334  PHART 7.405 7.455* 7.428  PCO2ART 48.7* 43.1 39.3  PO2ART 88.8 53.0* 119*   Liver Enzymes Recent Labs  Lab 12/09/17 2014 12/10/17 0500  ALBUMIN 2.2* 2.1*   Cardiac Enzymes No results for input(s): TROPONINI, PROBNP in the last 168 hours.  Glucose Recent Labs  Lab 12/12/17 0125 12/12/17 0226 12/12/17 0334 12/12/17 0435 12/12/17 0531 12/12/17 0629  GLUCAP 141* 129* 126* 121* 137* 146*   Imaging No results found. CULTURES: 6/27 MRSA PCR >> neg 11/21/2017  sputum>> normal flora  ANTIBIOTICS: 11/18/2017 Unasyn>> 7/2 7/2 Cefotan >> 7/6 Switched to Zosyn plus vancomycin 7/19  SIGNIFICANT EVENTS: 11/18/2017 intubated Right cervical lymph node biopsy 7/8 >> metastatic papillary thyroid carcinoma  LINES/TUBES: 11/18/2017 endotracheal tube>> 6/27 OGT >> 6/27 Foley >>  DISCUSSION: This is a 62 year old who has suffered from a posterior circulation infarct and has had a stent placed in the vertebrobasilar system.  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  He also has a thyroid cancer which is encasing the trachea, which has likely invaded the trachea and was responsible for hemoptysis prior to admission.  We have attempted to determine the patient's desires as to the appropriate level of care, but I was concerned about the consistency of his responses previously.  His mental status is clearing and he is more consistently responding appropriately to simple questions.  We may soon be able to elucidate his desires in this situation   ASSESSMENT / PLAN:  PULMONARY A: Vent dependent respiratory failure Possible airway compromise due to paratracheal/thyroid mass Pulmonary nodules, question metastatic  Ventilation is still somewhat high.  I continue to treat for potential pneumonia with a combination of vancomycin and Zosyn for Providentia and MRSA growing from his sputum associated with new infiltrates.  He is a little bronchospastic this morning and I have increased his dose of DuoNeb's.  His weight is back up to baseline and as his blood pressure allows it may be beneficial to begin reintroducing a small dose of diuretic.  CARDIOVASCULAR A:  Persistent atrial fibrillation.  He continues on full dose heparin.  His rate is controlled with a modest dose of Coreg.     Coronary artery disease.  A catheterization on 08/14/2016 showed a 99% left circumflex lesion.  Continue statin and Brilinta. Peripheral vascular disease Continue Crestor,  carvedilol Continue heparin drip  Hypotension.  He is no longer requiring pressors and I am continuing to taper his steroids.  Weight is currently back to baseline and he may benefit from reintroducing a small dose of diuretic once it is clear that his blood pressure has stabilized.     RENAL  A:   No acute renal issues   Thyroid Mass with possible associated airway compromise Diagnosis of papillary carcinoma established by biopsy during this admission.  Tracheostomy to secure a reliable airway was performed 7/18 so far without bleeding complication  GASTROINTESTINAL A:   History of GERD Nutrition P:   PPI as ordered HEMATOLOGIC A:   Chronic anticoagulation for atrial fibrillation Off anticoagulation for 3 weeks due to hemoptysis Follow CBC on aspirin, Brilinta, heparin drip  INFECTIOUS A:   Fever, leukocytosis.  Right lower lobe infiltrate and tongue necrosis are both considerations as to a source.  Continues vancomycin and Zosyn   ENDOCRINE Diabetes mellitus Better control is much easier this morning, he is only on half a unit per hour of insulin.  I am converting him to subcu insulin as I taper his stress dose steroids.   PAD Patient had a cool right foot which resolved with removing his right femoral sheath.    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  He is a DNR.  Lars Masson, MD. Critical Care Medicine. Pager: [phone]. After hours pager: [phone].  12/12/2017, 7:45 AM

## 2017-12-12 NOTE — Consult Note (Signed)
Reason for Consult: PEG tube Referring Physician: [name] is an 62 y.o. male.  HPI: I was consulted by Dr. Leonie Man from the Stroke Service for placement of PEG tube.  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  He can move his eyes but is otherwise not responsive.  Medical history reviewed in the chart.  Past Medical History:  Diagnosis Date  . Collagen vascular disease ([place])   . Coronary artery disease   . GERD (gastroesophageal reflux disease)   . High cholesterol   . Hypertension   . OSA (obstructive sleep apnea)    "dx'd years ago; never have worn mask; mask never ordered" (04/01/2017)  . Pneumonia 2011  . Type II diabetes mellitus ([place]) 07/2016    Past Surgical History:  Procedure Laterality Date  . CORONARY ANGIOPLASTY WITH STENT PLACEMENT  2011; 07/2016   s/p PCI  . IR ANGIO EXTRACRAN SEL COM CAROTID INNOMINATE UNI BILAT MOD SED  11/18/2017  . IR ANGIO VERTEBRAL SEL SUBCLAVIAN INNOMINATE UNI R MOD SED  11/18/2017  . IR INTRA CRAN STENT  11/18/2017  . IR US GUIDE BX ASP/DRAIN  11/29/2017  . LOWER EXTREMITY ANGIOGRAPHY Bilateral 04/01/2017   Procedure: Lower Extremity Angiography;  Surgeon: Lorretta Harp, MD;  Location: [facility];  Service: Cardiovascular;  Laterality: Bilateral;  . PERIPHERAL VASCULAR INTERVENTION Left 04/01/2017   Procedure: PERIPHERAL VASCULAR INTERVENTION;  Surgeon: Lorretta Harp, MD;  Location: [facility];  Service: Cardiovascular;  Laterality: Left;  common iliac  . RADIOLOGY WITH ANESTHESIA N/A 11/18/2017   Procedure: RADIOLOGY WITH ANESTHESIA;  Surgeon: Luanne Bras, MD;  Location: [facility];  Service: Radiology;  Laterality: N/A;  . TRACHEOSTOMY TUBE PLACEMENT N/A 12/09/2017   Procedure: TRACHEOSTOMY;  Surgeon: Melida Quitter, MD;  Location: [facility];  Service: ENT;  Laterality: N/A;  . VIDEO BRONCHOSCOPY Bilateral 08/25/2016   Procedure: VIDEO BRONCHOSCOPY WITHOUT FLUORO;  Surgeon: Collene Gobble, MD;  Location: [facility];  Service: Cardiopulmonary;  Laterality: Bilateral;    Family History  Problem Relation Age of Onset  . Alzheimer's disease Mother   . Breast cancer Sister     Social History:  reports that he quit smoking about 5 years ago. His smoking use included cigarettes. He has a 40.00 pack-year smoking history. He has never used smokeless tobacco. He reports that he drinks alcohol. He reports that he does not use drugs.  Allergies: No Known Allergies  Medications: I have reviewed the patient's current medications.  Results for orders placed or performed during the hospital encounter of 11/18/17 (from the past 48 hour(s))  Glucose, capillary     Status: Abnormal   Collection Time: 12/10/17  2:27 PM  Result Value Ref Range   Glucose-Capillary 157 (H) 70 - 99 mg/dL  Glucose, capillary     Status: Abnormal   Collection Time: 12/10/17  3:32 PM  Result Value Ref Range   Glucose-Capillary 139 (H) 70 - 99 mg/dL  Glucose, capillary     Status: Abnormal   Collection Time: 12/10/17  5:04 PM  Result Value Ref Range   Glucose-Capillary 176 (H) 70 - 99 mg/dL  Glucose, capillary     Status: Abnormal   Collection Time: 12/10/17  5:57 PM  Result Value Ref Range   Glucose-Capillary 175 (H) 70 - 99 mg/dL  Glucose, capillary     Status: Abnormal   Collection Time: 12/10/17  6:58 PM  Result Value Ref Range   Glucose-Capillary 195 (H) 70 - 99 mg/dL  Glucose, capillary     Status: Abnormal   Collection Time: 12/10/17  7:58 PM  Result Value Ref Range   Glucose-Capillary 179 (H) 70 - 99 mg/dL  Glucose, capillary     Status: Abnormal   Collection Time: 12/10/17  8:48 PM  Result Value Ref Range   Glucose-Capillary 159 (H) 70 - 99 mg/dL  Glucose, capillary     Status: Abnormal   Collection Time: 12/10/17  9:40 PM  Result Value Ref Range   Glucose-Capillary 169 (H) 70 - 99 mg/dL  Glucose, capillary      Status: Abnormal   Collection Time: 12/10/17 10:46 PM  Result Value Ref Range   Glucose-Capillary 150 (H) 70 - 99 mg/dL  Glucose, capillary     Status: Abnormal   Collection Time: 12/10/17 11:53 PM  Result Value Ref Range   Glucose-Capillary 139 (H) 70 - 99 mg/dL  Glucose, capillary     Status: Abnormal   Collection Time: 12/11/17 12:49 AM  Result Value Ref Range   Glucose-Capillary 135 (H) 70 - 99 mg/dL  Glucose, capillary     Status: Abnormal   Collection Time: 12/11/17  1:52 AM  Result Value Ref Range   Glucose-Capillary 148 (H) 70 - 99 mg/dL  Glucose, capillary     Status: Abnormal   Collection Time: 12/11/17  2:56 AM  Result Value Ref Range   Glucose-Capillary 129 (H) 70 - 99 mg/dL  Blood gas, arterial     Status: Abnormal   Collection Time: 12/11/17  3:34 AM  Result Value Ref Range   FIO2 40.00    Delivery systems VENTILATOR    Mode PRESSURE CONTROL    LHR 20 resp/min   Peep/cpap 8.0 cm H20   Pressure control 18 cm H20   pH, Arterial 7.428 7.350 - 7.450   pCO2 arterial 39.3 32.0 - 48.0 mmHg   pO2, Arterial 119 (H) 83.0 - 108.0 mmHg   Bicarbonate 25.4 20.0 - 28.0 mmol/L   Acid-Base Excess 1.6 0.0 - 2.0 mmol/L   O2 Saturation 98.6 %   Patient temperature 99.4    Collection site RIGHT RADIAL    Drawn by [id number]    Sample type ARTERIAL DRAW    Allens test (pass/fail) PASS PASS  Glucose, capillary     Status: Abnormal   Collection Time: 12/11/17  3:58 AM  Result Value Ref Range   Glucose-Capillary 153 (H) 70 - 99 mg/dL  Heparin level (unfractionated)     Status: None   Collection Time: 12/11/17  4:29 AM  Result Value Ref Range   Heparin Unfractionated 0.30 0.30 - 0.70 IU/mL    Comment: (NOTE) If heparin results are below expected values, and patient dosage has  been confirmed, suggest follow up testing of antithrombin III levels. Performed at [facility], [place] [address] [place], [place] [address]   Procalcitonin     Status: None   Collection Time:  12/11/17  4:29 AM  Result Value Ref Range   Procalcitonin 0.11 ng/mL    Comment:        Interpretation: PCT (Procalcitonin) <= 0.5 ng/mL: Systemic infection (sepsis) is not likely. Local bacterial infection is possible. (NOTE)       Sepsis PCT Algorithm           Lower Respiratory Tract  Infection PCT Algorithm    ----------------------------     ----------------------------         PCT < 0.25 ng/mL                PCT < 0.10 ng/mL         Strongly encourage             Strongly discourage   discontinuation of antibiotics    initiation of antibiotics    ----------------------------     -----------------------------       PCT 0.25 - 0.50 ng/mL            PCT 0.10 - 0.25 ng/mL               OR       >80% decrease in PCT            Discourage initiation of                                            antibiotics      Encourage discontinuation           of antibiotics    ----------------------------     -----------------------------         PCT >= 0.50 ng/mL              PCT 0.26 - 0.50 ng/mL               AND        <80% decrease in PCT             Encourage initiation of                                             antibiotics       Encourage continuation           of antibiotics    ----------------------------     -----------------------------        PCT >= 0.50 ng/mL                  PCT > 0.50 ng/mL               AND         increase in PCT                  Strongly encourage                                      initiation of antibiotics    Strongly encourage escalation           of antibiotics                                     -----------------------------                                           PCT <= 0.25 ng/mL  OR                                        > 80% decrease in PCT                                     Discontinue / Do not initiate                                              antibiotics Performed at [facility], [place] [address] [place], [place] [address]   CBC with Differential/Platelet     Status: Abnormal   Collection Time: 12/11/17  4:29 AM  Result Value Ref Range   WBC 7.6 4.0 - 10.5 K/uL   RBC 3.39 (L) 4.22 - 5.81 MIL/uL   Hemoglobin 9.3 (L) 13.0 - 17.0 g/dL   HCT 30.1 (L) 39.0 - 52.0 %   MCV 88.8 78.0 - 100.0 fL   MCH 27.4 26.0 - 34.0 pg   MCHC 30.9 30.0 - 36.0 g/dL   RDW 14.0 11.5 - 15.5 %   Platelets 283 150 - 400 K/uL   Neutrophils Relative % 79 %   Neutro Abs 6.0 1.7 - 7.7 K/uL   Lymphocytes Relative 14 %   Lymphs Abs 1.1 0.7 - 4.0 K/uL   Monocytes Relative 6 %   Monocytes Absolute 0.5 0.1 - 1.0 K/uL   Eosinophils Relative 0 %   Eosinophils Absolute 0.0 0.0 - 0.7 K/uL   Basophils Relative 0 %   Basophils Absolute 0.0 0.0 - 0.1 K/uL   Immature Granulocytes 1 %   Abs Immature Granulocytes 0.1 0.0 - 0.1 K/uL    Comment: Performed at [facility] [address] [place], [place] [address]  Basic metabolic panel     Status: Abnormal   Collection Time: 12/11/17  4:29 AM  Result Value Ref Range   Sodium 141 135 - 145 mmol/L   Potassium 3.2 (L) 3.5 - 5.1 mmol/L   Chloride 108 98 - 111 mmol/L    Comment: Please note change in reference range.   CO2 26 22 - 32 mmol/L   Glucose, Bld 165 (H) 70 - 99 mg/dL    Comment: Please note change in reference range.   BUN 15 8 - 23 mg/dL    Comment: Please note change in reference range.   Creatinine, Ser 0.45 (L) 0.61 - 1.24 mg/dL   Calcium 8.5 (L) 8.9 - 10.3 mg/dL   GFR calc non Af Amer >60 >60 mL/min   GFR calc Af Amer >60 >60 mL/min    Comment: (NOTE) The eGFR has been calculated using the CKD EPI equation. This calculation has not been validated in all clinical situations. eGFR's persistently <60 mL/min signify possible Chronic Kidney Disease.    Anion gap 7 5 - 15    Comment: Performed at [facility] [address] [place], [place] [address]  Glucose, capillary      Status: Abnormal   Collection Time: 12/11/17  4:55 AM  Result Value Ref Range   Glucose-Capillary 161 (H) 70 - 99 mg/dL  Glucose, capillary     Status: Abnormal   Collection Time:  12/11/17  5:48 AM  Result Value Ref Range   Glucose-Capillary 165 (H) 70 - 99 mg/dL  Glucose, capillary     Status: Abnormal   Collection Time: 12/11/17  6:52 AM  Result Value Ref Range   Glucose-Capillary 185 (H) 70 - 99 mg/dL  Glucose, capillary     Status: Abnormal   Collection Time: 12/11/17  8:27 AM  Result Value Ref Range   Glucose-Capillary 188 (H) 70 - 99 mg/dL  Glucose, capillary     Status: Abnormal   Collection Time: 12/11/17  9:37 AM  Result Value Ref Range   Glucose-Capillary 177 (H) 70 - 99 mg/dL  Glucose, capillary     Status: Abnormal   Collection Time: 12/11/17 11:03 AM  Result Value Ref Range   Glucose-Capillary 170 (H) 70 - 99 mg/dL  Glucose, capillary     Status: Abnormal   Collection Time: 12/11/17 12:07 PM  Result Value Ref Range   Glucose-Capillary 177 (H) 70 - 99 mg/dL  Glucose, capillary     Status: Abnormal   Collection Time: 12/11/17  1:12 PM  Result Value Ref Range   Glucose-Capillary 174 (H) 70 - 99 mg/dL  Glucose, capillary     Status: Abnormal   Collection Time: 12/11/17  2:16 PM  Result Value Ref Range   Glucose-Capillary 157 (H) 70 - 99 mg/dL  Glucose, capillary     Status: Abnormal   Collection Time: 12/11/17  3:48 PM  Result Value Ref Range   Glucose-Capillary 149 (H) 70 - 99 mg/dL  Glucose, capillary     Status: Abnormal   Collection Time: 12/11/17  5:05 PM  Result Value Ref Range   Glucose-Capillary 138 (H) 70 - 99 mg/dL  Glucose, capillary     Status: Abnormal   Collection Time: 12/11/17  6:21 PM  Result Value Ref Range   Glucose-Capillary 182 (H) 70 - 99 mg/dL  Glucose, capillary     Status: Abnormal   Collection Time: 12/11/17  7:33 PM  Result Value Ref Range   Glucose-Capillary 189 (H) 70 - 99 mg/dL  Glucose, capillary     Status: Abnormal    Collection Time: 12/11/17  8:37 PM  Result Value Ref Range   Glucose-Capillary 166 (H) 70 - 99 mg/dL  Glucose, capillary     Status: Abnormal   Collection Time: 12/11/17  9:36 PM  Result Value Ref Range   Glucose-Capillary 160 (H) 70 - 99 mg/dL  Glucose, capillary     Status: Abnormal   Collection Time: 12/11/17 10:35 PM  Result Value Ref Range   Glucose-Capillary 165 (H) 70 - 99 mg/dL  Glucose, capillary     Status: Abnormal   Collection Time: 12/11/17 11:28 PM  Result Value Ref Range   Glucose-Capillary 175 (H) 70 - 99 mg/dL  Glucose, capillary     Status: Abnormal   Collection Time: 12/12/17 12:30 AM  Result Value Ref Range   Glucose-Capillary 145 (H) 70 - 99 mg/dL  Glucose, capillary     Status: Abnormal   Collection Time: 12/12/17  1:25 AM  Result Value Ref Range   Glucose-Capillary 141 (H) 70 - 99 mg/dL  Glucose, capillary     Status: Abnormal   Collection Time: 12/12/17  2:26 AM  Result Value Ref Range   Glucose-Capillary 129 (H) 70 - 99 mg/dL  Glucose, capillary     Status: Abnormal   Collection Time: 12/12/17  3:34 AM  Result Value Ref Range   Glucose-Capillary 126 (H) 70 -  99 mg/dL  Glucose, capillary     Status: Abnormal   Collection Time: 12/12/17  4:35 AM  Result Value Ref Range   Glucose-Capillary 121 (H) 70 - 99 mg/dL  Glucose, capillary     Status: Abnormal   Collection Time: 12/12/17  5:31 AM  Result Value Ref Range   Glucose-Capillary 137 (H) 70 - 99 mg/dL  CBC     Status: Abnormal   Collection Time: 12/12/17  5:44 AM  Result Value Ref Range   WBC 6.4 4.0 - 10.5 K/uL   RBC 3.25 (L) 4.22 - 5.81 MIL/uL   Hemoglobin 8.8 (L) 13.0 - 17.0 g/dL   HCT 29.0 (L) 39.0 - 52.0 %   MCV 89.2 78.0 - 100.0 fL   MCH 27.1 26.0 - 34.0 pg   MCHC 30.3 30.0 - 36.0 g/dL   RDW 13.9 11.5 - 15.5 %   Platelets 242 150 - 400 K/uL    Comment: Performed at [facility], [place] [address], [place], [place] [address]  Heparin level (unfractionated)     Status: Abnormal    Collection Time: 12/12/17  5:44 AM  Result Value Ref Range   Heparin Unfractionated 0.24 (L) 0.30 - 0.70 IU/mL    Comment: (NOTE) If heparin results are below expected values, and patient dosage has  been confirmed, suggest follow up testing of antithrombin III levels. Performed at [facility], [place] [address], [place], [place] [address]   Basic metabolic panel     Status: Abnormal   Collection Time: 12/12/17  5:44 AM  Result Value Ref Range   Sodium 142 135 - 145 mmol/L   Potassium 3.2 (L) 3.5 - 5.1 mmol/L   Chloride 111 98 - 111 mmol/L    Comment: Please note change in reference range.   CO2 25 22 - 32 mmol/L   Glucose, Bld 144 (H) 70 - 99 mg/dL    Comment: Please note change in reference range.   BUN 13 8 - 23 mg/dL    Comment: Please note change in reference range.   Creatinine, Ser 0.48 (L) 0.61 - 1.24 mg/dL   Calcium 8.7 (L) 8.9 - 10.3 mg/dL   GFR calc non Af Amer >60 >60 mL/min   GFR calc Af Amer >60 >60 mL/min    Comment: (NOTE) The eGFR has been calculated using the CKD EPI equation. This calculation has not been validated in all clinical situations. eGFR's persistently <60 mL/min signify possible Chronic Kidney Disease.    Anion gap 6 5 - 15    Comment: Performed at [facility] [address], [place], [place] [address]  Glucose, capillary     Status: Abnormal   Collection Time: 12/12/17  6:29 AM  Result Value Ref Range   Glucose-Capillary 146 (H) 70 - 99 mg/dL  Glucose, capillary     Status: Abnormal   Collection Time: 12/12/17  7:44 AM  Result Value Ref Range   Glucose-Capillary 151 (H) 70 - 99 mg/dL  Glucose, capillary     Status: Abnormal   Collection Time: 12/12/17  8:55 AM  Result Value Ref Range   Glucose-Capillary 164 (H) 70 - 99 mg/dL  Glucose, capillary     Status: Abnormal   Collection Time: 12/12/17 10:09 AM  Result Value Ref Range   Glucose-Capillary 175 (H) 70 - 99 mg/dL  Glucose, capillary     Status: Abnormal   Collection Time:  12/12/17 11:53 AM  Result Value Ref Range  Glucose-Capillary 178 (H) 70 - 99 mg/dL    Dg Chest Port 1 View  Result Date: 12/12/2017 CLINICAL DATA:  Dyspnea. EXAM: PORTABLE CHEST 1 VIEW COMPARISON:  12/11/2017 FINDINGS: Tracheostomy tube and right-sided PICC line unchanged enteric tube courses into the region of the stomach as tip is not visualized. Lungs are adequately inflated demonstrate continued evidence of bilateral hazy perihilar opacification likely interstitial edema with subtle improvement. No definite effusion. Stable cardiomegaly. Remainder of the exam is unchanged. IMPRESSION: Stable cardiomegaly with subtle improvement bilateral hazy perihilar opacification likely interstitial edema. Tubes and lines unchanged. Electronically Signed   By: [name] Olp M.D.   On: 12/12/2017 11:20   Dg Chest Port 1 View  Result Date: 12/11/2017 CLINICAL DATA:  Fever. EXAM: PORTABLE CHEST 1 VIEW COMPARISON:  Multiple previous recent chest x-rays. FINDINGS: The tracheostomy tube is stable. The feeding tube is coursing down the esophagus and into the stomach. The right PICC line is stable. Slight worsening lung aeration, particularly on the right may suggest worsening asymmetric pulmonary edema. No large pleural effusions or pneumothorax. IMPRESSION: Stable support apparatus. Slight worsening lung aeration likely due to slight worsening pulmonary edema. Electronically Signed   By: Marijo Sanes M.D.   On: 12/11/2017 08:38    Review of Systems  Unable to perform ROS: Intubated   Blood pressure (!) 169/85, pulse (!) 104, temperature 100 F (37.8 C), temperature source Axillary, resp. rate (!) 0, height '5\' 10"'$  (1.778 m), weight (!) 143.9 kg (317 lb 3.9 oz), SpO2 99 %. Physical Exam  Constitutional: He appears well-developed and well-nourished.  HENT:  Head: Normocephalic.  Eyes: Pupils are equal, round, and reactive to light. EOM are normal.  Neck:  Tracheostomy  Cardiovascular:  Irregularly  irregular rhythm with heart rate 140  Respiratory: Effort normal and breath sounds normal. No respiratory distress. He has no wheezes.  GI: Soft. He exhibits no distension. There is no tenderness. There is no rebound and no guarding.  Musculoskeletal: He exhibits no edema.  Neurological:  Awake on the ventilator and moves eyes but otherwise no other movement  Skin: Skin is warm.    Assessment/Plan: Need for enteral access -we will plan bedside PEG tube placement by Dr. Hulen Skains tomorrow.  Hold tube feeds at midnight.  The nursing staff is trying to obtain documentation on his longtime girlfriend's healthcare power of attorney.  This will allow her to sign consent.  Atrial fibrillation with rapid ventricular response- per CCM  [name] [name] 12/12/2017, 1:48 PM

## 2017-12-12 NOTE — Progress Notes (Signed)
Dr. Grandville Silos called and order given to this RN to stop patient's Heparin drip 12/13/17 at 4 am.

## 2017-12-12 NOTE — Progress Notes (Signed)
STROKE TEAM PROGRESS NOTE   SUBJECTIVE (INTERVAL HISTORY) His significant other and HPOA is at bedside. Patient is now able to move his head in vertical plane. CCM M.D. Feels patient is likely going to need to go to long-term ventilator facility for weaning  OBJECTIVE Temp:  [98.3 F (36.8 C)-100 F (37.8 C)] 100 F (37.8 C) (07/21 0800) Pulse Rate:  [32-118] 105 (07/21 0700) Cardiac Rhythm: Atrial fibrillation (07/20 2000) Resp:  [0-40] 29 (07/21 0700) BP: (92-144)/(56-90) 111/76 (07/21 0700) SpO2:  [91 %-100 %] 99 % (07/21 1222) FiO2 (%):  [40 %] 40 % (07/21 1222) Weight:  [317 lb 3.9 oz (143.9 kg)] 317 lb 3.9 oz (143.9 kg) (07/21 0600)  CBC:  Recent Labs  Lab 12/10/17 0500 12/11/17 0429 12/12/17 0544  WBC 15.5* 7.6 6.4  NEUTROABS 13.6* 6.0  --   HGB 10.8* 9.3* 8.8*  HCT 35.2* 30.1* 29.0*  MCV 87.6 88.8 89.2  PLT 412* 283 998    Basic Metabolic Panel:  Recent Labs  Lab 12/09/17 2014 12/10/17 0500 12/11/17 0429 12/12/17 0544  NA 140 140 141 142  K 3.8 3.6 3.2* 3.2*  CL 105 105 108 111  CO2 27 26 26 25   GLUCOSE 176* 160* 165* 144*  BUN 21 17 15 13   CREATININE 0.49* 0.46* 0.45* 0.48*  CALCIUM 8.8* 8.6* 8.5* 8.7*  MG 1.9 1.9  --   --   PHOS <1.0* 2.1*  --   --     Lipid Panel:     Component Value Date/Time   CHOL 110 11/19/2017 0437   CHOL 119 08/09/2017 1000   TRIG 340 (H) 11/19/2017 0437   HDL 19 (L) 11/19/2017 0437   HDL 29 (L) 08/09/2017 1000   CHOLHDL 5.8 11/19/2017 0437   VLDL 68 (H) 11/19/2017 0437   LDLCALC 23 11/19/2017 0437   LDLCALC 30 08/09/2017 1000   HgbA1c:  Lab Results  Component Value Date   HGBA1C 9.1 (H) 11/19/2017   Urine Drug Screen: No results found for: LABOPIA, COCAINSCRNUR, LABBENZ, AMPHETMU, THCU, LABBARB  Alcohol Level No results found for: [place] I have personally reviewed the radiological images below and agree with the radiology interpretations.  Ct Angio Head W Or Wo Contrast  Ct Angio Neck W Or Wo  Contrast 11/18/2017 Since the study of earlier today, there are only 2 changes. There is new consolidation and volume loss in the right upper lobe. 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 Flow is seen distal to that however, presumably indicating patent posterior communicating arteries.   Ct Angio Head W Or Wo Contrast Ct Angio Neck W Or Wo Contrast 11/18/2017 1. Severe intracranial and extracranial atherosclerotic disease. No emergent large vessel occlusion  2. Severe stenosis right carotid bifurcation due to calcified plaque, estimated 90% diameter stenosis. No significant left carotid stenosis. Severe stenosis cavernous carotid bilaterally.  3. Both vertebral arteries are patent in the neck with severe calcific stenosis V4 segment bilaterally.  4. 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 Small calcified lymph nodes left neck suspicious for metastatic disease  5. Small lung nodules on the right, possible metastatic disease. Recommend chest CT for further evaluation.   Ct Head Wo Contrast 11/19/2017 1. Expected evolution of brainstem infarct without hemorrhage.  2. No new infarcts.  3. Left vertebral artery and basilar artery stent.  4. Acute on chronic sinus disease may be related to intubation.   Ct Chest W Contrast 11/18/2017 Right upper lobe  airspace opacity is noted with air bronchograms concerning for pneumonia or possibly atelectasis. Stable mediastinal adenopathy is noted compared to prior exam. Endotracheal and nasogastric tubes are in grossly good position. Coronary artery calcifications are noted suggesting coronary artery disease. Possible 2.8 cm left thyroid nodule. Thyroid ultrasound is recommended for further evaluation. Aortic Atherosclerosis (ICD10-I70.0).   Mr Brain Wo Contrast 11/19/2017 1. 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 No associated hemorrhage  or significant mass effect.  2. Additional punctate acute/early subacute infarctions within left lateral cerebellum and right parietal cortex.  3. Minimal chronic microvascular ischemic changes of the brain and mild parenchymal volume loss.   Ct Head Code Stroke Wo Contrast 11/18/2017 1. No acute intracranial abnormality.  Atherosclerotic calcification  2. ASPECTS is 10   Cerebral Angiogram / Stent  11/18/2017 1. 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 Occluded non dominant RT VBJ. 2. S/P endovascular revascularization of the distal basilar artery  To 90 % patency , and of 30 to 40 % of the LT VBJ with stent placement. Dominant  Lt Pcom.  Transthoracic Echocardiogram - Left ventricle: The cavity size was moderately dilated. Wall thickness was increased in a pattern of severe LVH. Systolic function was normal. The estimated ejection fraction was in the range of 55% to 60%. Wall motion was normal; there were no regional wall motion abnormalities. The study is not technically sufficient to allow evaluation of LV diastolic function. - Aortic valve: Trileaflet. Sclerosis without stenosis. There was no regurgitation. Valve area (Vmax): 2.88 cm^2. - Aorta: Aortic root ML diameter: 41.95 mm (ED). - Aortic root: The aortic root is mildly dilated. - Mitral valve: Calcified annulus. There was trivial regurgitation. - Left atrium: The atrium was mildly dilated. - Right ventricle: The cavity size was mildly dilated. Mildly reduced systolic function. - Inferior vena cava: The vessel was normal in size. The respirophasic diameter changes were in the normal range (>= 50%), consistent with normal central venous pressure. Impressions:  Compared to a prior study in 02/2017, there have been no significant changes.  Ct Head Wo Contrast 11/24/2017 1. Continued normal expected interval evolution of brainstem infarct. No evidence for hemorrhagic  transformation or other complication.  2. No other new intracranial abnormality.  3. Moderate paranasal sinus disease with bilateral mastoid effusions, likely related intubation.   Dg Chest Port 1 View 11/25/2017 Mild vascular congestion. Tubes and lines as described.   Ct Soft Tissue Neck W Contrast 11/26/2017 Aside from the fact that the patient shows endotracheal intubation and the presence of an orogastric tube, and aside from development of bilateral layering pleural effusions with dependent atelectasis, the findings are unchanged since the previous studies. Irregular mass arising from the inferior left thyroid lobe, measuring approximately 5 x 3.4 x 5.5 cm, with invasion of the surrounding tissues worrisome for thyroid carcinoma. Partial encasement of the trachea. Broad surface along the esophagus. Small but likely pathologic nodes in the lower neck and superior mediastinum, some with calcification, probably involved by metastatic disease.   Dg Chest Port 1 View 11/29/2017 Tube and catheter positions as described without pneumothorax. There is pulmonary vascular congestion with interstitial edema and small pleural effusions. Suspect a degree of underlying congestive heart failure. No airspace consolidation.    Lymph node, needle/core biopsy - METASTATIC PAPILLARY THYROID CARCINOMA.   PHYSICAL EXAM General - morbid obesity,middle  aged Caucasian male, s/p traceostomy on low dose sedation.  Ophthalmologic - fundi not visualized due to noncooperation.  Cardiovascular - irregularly irregular heart rate and rhythm, afib RVR.  Neuro - status post tracheostomy on low dose sedation, eyes open, following commands with vertical greater than horizontal eye movement and eye close and opening, not blinking to visual threat bilaterally, PERRL. Weak corneal bilaterally, R>L. Positive gag. Facial symmetry not able to test due to ET tube, not able to move tongue or mouth on command. No movement on all  extremities even with pain stimulation, DTR diminished and no babinski. Sensation, coordination and gait not tested.   ASSESSMENT/PLAN [name] is a 62 y.o. male with PMH of DM, hemoptysis, OSA, HTN, HLD, CAD, afib off Eliquis due to hemoptysis presenting with slurred speech, right sided weakness. Was intubated for hemoptysis prevention while giving tPA. Symptoms worsened and imaging confirmed BA occlusion. S/p IR with revascularization of BA as well as left VA s/p stenting.  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   MRI head - large acute/early subacute infarction within the central pons involving both sides, greater on the right.  CTA H&N - severe vascular athero including BA stenosis, b/l VA stenosis, right ICA proximal 90% stenosis, b/l cavernous ICA stenosis R>L.  Repeat CTA H/N - new distal BA occlusion  DSA - revascularization of the distal basilar artery to 90 % patency , and of 30 to 40 % of the LT VBJ with stent placement.  CT head 11/24/17 showed normal evolution of brainstem infarct, no acute change  2D Echo - EF 55-60%  LDL - 23  HgbA1c - 9.1  VTE prophylaxis - heparin subq  Eliquis (recently stopped) and plavix prior to admission, now on IV heparin drip.   Ongoing aggressive stroke risk factor management  Therapy recommendations:  pending  Disposition:  Pending  Palliative care on board  METASTATIC PAPILLARY THYROID CARCINOMA  CTA neck - right large thyroid mass compressing on trachea.   Thyroid mass larger than CT chest in 08/2016  Evidence of tracheal erosion on bronch at this  admission  CT neck with contrast showed thyroid carcinoma, encasement of trachea.    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   oncology Dr Learta Codding on board  Respiratory failure  CCM on board  Still on vent  Wean off sedation as able  S/p Trach this am   PAF  Currently in afib RVR with respiratory distress  On eliquis prior but off due to hemoptysis  On heparin IV now  Continue brilinta due to [place] stent  Coreg bid for rate control  Cerebral vascular athero  CTA head and neck showed right ICA proximal 90% stenosis, left ICA proximal athero, b/l cavernous ICA severe athro R>L, BA severe stenosis, b/l V4 severe athero with right V4 occlusion  S/p left V4 stent and BA revascularization  On brilinta and heparin IV  Hemoptysis, improved  Since 08/2016 as per family  Not sure about etiology  TB ruled out in the past  Likely tracheal erosion from right thyroid mass   CT neck showed thyroid carcinoma, encasement of trachea.   CCM  on board  Consider tertiary center transfer for mass debulking or tracheal resection  Sepsis  101.8->102.2->101.6->101.5  Leukocytosis WBC 10.7->12.6->15.9->15.5  Hypotension - on levophed  On cefepime -> zosyn and vanco now  CXR - bibasilar atelectasis, CHF  CHF  Off lasix   CXR showed persistent CHF  Off coreg due to hypotension  Gentle hydration  Hypotension  With maxed on levophed  Coreg and lasix discontinued . Long term BP goal normotensive.  Hyperlipidemia  Lipid lowering medication PTA: Crestor 10 mg daily  LDL 23, goal < 70  On Crestor  Continue statin at discharge  Diabetes  HgbA1c 9.1, goal < 7.0  Uncontrolled  continued on insulin drip  Continued hyperglycemia  CCM on board   CBG monitoring  Dysphagia   Put on cortrak  Resume TF after  Other Stroke Risk Factors  Advanced age  Former cigarette smoker - quit, 40 pack year history  Morbid obesity, Body  mass index is 45.52 kg/m.    Hx stroke/TIA  Coronary artery disease   PAD with claudication  OSA on CPAP  Other Active Problems  Palliative care on board  I had a long discussion with the patient's health power of attorney at the bedside and answered questions about his care.  patient's girlfriend appears to be optimistic with improvement in his head movements though I counseled her that this is very little improvement and had a long way to go. I strongly encouraged him to consider having a PEG tube so that he could  move forward towards rehabilitation in a long-term ventilator facility. She voiced understanding. I have consulted trauma team to place a PEG tube in the next few days. Discussed with Dr. Marolyn Hammock and Dr. Grandville Silos.This patient is critically ill and at significant risk of neurological worsening, death and care requires constant monitoring of vital signs, hemodynamics,respiratory and cardiac monitoring, extensive review of multiple databases, frequent neurological assessment, discussion with family, other specialists and medical decision making of high complexity. I spent 35 minutes of neurocritical care time  in the care of  this patient.   Antony Contras, MD Stroke Neurology 12/12/2017 1:05 PM    To contact Stroke Continuity provider, please refer to [email or web address] After hours, contact General Neurology

## 2017-12-12 NOTE — Progress Notes (Signed)
Copy of progress note from 6/27 stating pt's significant other can sign consent for him has been placed in front of physical chart for easy access.

## 2017-12-13 ENCOUNTER — Encounter (HOSPITAL_COMMUNITY)
Admission: EM | Disposition: A | Discharge status: Nursing Home (Includes ICF) | Payer: Self-pay | Source: Home / Self Care | Attending: Neurology

## 2017-12-13 HISTORY — PX: ESOPHAGOGASTRODUODENOSCOPY: SHX5428

## 2017-12-13 HISTORY — PX: PEG PLACEMENT: SHX5437

## 2017-12-13 LAB — GLUCOSE, CAPILLARY
GLUCOSE-CAPILLARY: 126 mg/dL — AB (ref 70–99)
GLUCOSE-CAPILLARY: 139 mg/dL — AB (ref 70–99)
GLUCOSE-CAPILLARY: 223 mg/dL — AB (ref 70–99)
Glucose-Capillary: 138 mg/dL — ABNORMAL HIGH (ref 70–99)
Glucose-Capillary: 136 mg/dL — ABNORMAL HIGH (ref 70–99)
Glucose-Capillary: 175 mg/dL — ABNORMAL HIGH (ref 70–99)

## 2017-12-13 LAB — BASIC METABOLIC PANEL
ANION GAP: 6 (ref 5–15)
BUN: 16 mg/dL (ref 8–23)
CHLORIDE: 113 mmol/L — AB (ref 98–111)
CO2: 25 mmol/L (ref 22–32)
CREATININE: 0.49 mg/dL — AB (ref 0.61–1.24)
Calcium: 8.9 mg/dL (ref 8.9–10.3)
GFR calc non Af Amer: >60 mL/min (ref >60)
Glucose, Bld: 144 mg/dL — ABNORMAL HIGH (ref 70–99)
POTASSIUM: 3.4 mmol/L — AB (ref 3.5–5.1)
SODIUM: 144 mmol/L (ref 135–145)

## 2017-12-13 LAB — VANCOMYCIN, TROUGH: VANCOMYCIN TR: 7 ug/mL — AB (ref 15–20)

## 2017-12-13 LAB — BLOOD GAS, ARTERIAL
ACID-BASE EXCESS: 1.4 mmol/L (ref 0.0–2.0)
BICARBONATE: 24.3 mmol/L (ref 20.0–28.0)
DRAWN BY: 511911
FIO2: 0.4
O2 SAT: 89.7 %
PATIENT TEMPERATURE: 100.3
PCO2 ART: 32.8 mmHg (ref 32.0–48.0)
PEEP/CPAP: 8 cmH2O
PH ART: 7.488 — AB (ref 7.350–7.450)
PO2 ART: 57 mmHg — AB (ref 83.0–108.0)
PRESSURE CONTROL: 18 cmH2O
RATE: 20 resp/min

## 2017-12-13 LAB — CBC WITH DIFFERENTIAL/PLATELET
ABS IMMATURE GRANULOCYTES: 0.1 10*3/uL (ref 0.0–0.1)
Basophils Absolute: 0 10*3/uL (ref 0.0–0.1)
Basophils Relative: 0 %
EOS PCT: 2 %
Eosinophils Absolute: 0.2 10*3/uL (ref 0.0–0.7)
HEMATOCRIT: 31.1 % — AB (ref 39.0–52.0)
HEMOGLOBIN: 9.5 g/dL — AB (ref 13.0–17.0)
Immature Granulocytes: 1 %
LYMPHS ABS: 1.3 10*3/uL (ref 0.7–4.0)
LYMPHS PCT: 19 %
MCH: 27.4 pg (ref 26.0–34.0)
MCHC: 30.5 g/dL (ref 30.0–36.0)
MCV: 89.6 fL (ref 78.0–100.0)
Monocytes Absolute: 0.5 10*3/uL (ref 0.1–1.0)
Monocytes Relative: 8 %
NEUTROS ABS: 4.6 10*3/uL (ref 1.7–7.7)
Neutrophils Relative %: 70 %
Platelets: 261 10*3/uL (ref 150–400)
RBC: 3.47 MIL/uL — AB (ref 4.22–5.81)
RDW: 14.2 % (ref 11.5–15.5)
WBC: 6.6 10*3/uL (ref 4.0–10.5)

## 2017-12-13 SURGERY — EGD (ESOPHAGOGASTRODUODENOSCOPY)

## 2017-12-13 MED ORDER — IPRATROPIUM-ALBUTEROL 0.5-2.5 (3) MG/3ML IN SOLN
3.0000 mL | RESPIRATORY_TRACT | Status: DC | PRN
  Administered 2017-12-15: 3 mL via RESPIRATORY_TRACT
  Filled 2017-12-13: qty 3

## 2017-12-13 MED ORDER — HYDROCORTISONE NA SUCCINATE PF 100 MG IJ SOLR
12.5000 mg | Freq: Two times a day (BID) | INTRAMUSCULAR | Status: DC
  Administered 2017-12-13: 12.5 mg via INTRAVENOUS
  Filled 2017-12-13: qty 2

## 2017-12-13 MED ORDER — ASPIRIN 81 MG PO CHEW
81.0000 mg | CHEWABLE_TABLET | Freq: Every day | ORAL | Status: DC
  Administered 2017-12-13 – 2017-12-14 (×2): 81 mg
  Filled 2017-12-13 (×2): qty 1

## 2017-12-13 MED ORDER — VECURONIUM BROMIDE 10 MG IV SOLR
INTRAVENOUS | Status: AC
  Filled 2017-12-13: qty 10

## 2017-12-13 MED ORDER — VANCOMYCIN HCL 500 MG IV SOLR
500.0000 mg | Freq: Once | INTRAVENOUS | Status: AC
  Administered 2017-12-13: 500 mg via INTRAVENOUS
  Filled 2017-12-13: qty 500

## 2017-12-13 MED ORDER — MIDAZOLAM HCL 2 MG/2ML IJ SOLN
4.0000 mg | Freq: Once | INTRAMUSCULAR | Status: AC
  Administered 2017-12-13: 4 mg via INTRAVENOUS

## 2017-12-13 MED ORDER — VITAL HIGH PROTEIN PO LIQD
1000.0000 mL | ORAL | Status: DC
  Administered 2017-12-13 – 2017-12-15 (×2): 1000 mL

## 2017-12-13 MED ORDER — PRO-STAT SUGAR FREE PO LIQD
30.0000 mL | Freq: Three times a day (TID) | ORAL | Status: DC
  Administered 2017-12-13 – 2017-12-15 (×7): 30 mL
  Filled 2017-12-13 (×6): qty 30

## 2017-12-13 MED ORDER — INSULIN ASPART 100 UNIT/ML ~~LOC~~ SOLN
0.0000 [IU] | SUBCUTANEOUS | Status: DC
  Administered 2017-12-13: 3 [IU] via SUBCUTANEOUS
  Administered 2017-12-13 (×2): 2 [IU] via SUBCUTANEOUS
  Administered 2017-12-13 – 2017-12-14 (×2): 5 [IU] via SUBCUTANEOUS
  Administered 2017-12-14 (×3): 3 [IU] via SUBCUTANEOUS
  Administered 2017-12-14 – 2017-12-15 (×6): 5 [IU] via SUBCUTANEOUS

## 2017-12-13 MED ORDER — VANCOMYCIN HCL 10 G IV SOLR
1500.0000 mg | Freq: Two times a day (BID) | INTRAVENOUS | Status: DC
  Administered 2017-12-14 – 2017-12-15 (×3): 1500 mg via INTRAVENOUS
  Filled 2017-12-13 (×4): qty 1500

## 2017-12-13 MED ORDER — DOCUSATE SODIUM 50 MG/5ML PO LIQD: 100.0000 mg | Freq: Two times a day (BID) | ORAL | Status: DC

## 2017-12-13 MED ORDER — HEPARIN (PORCINE) IN NACL 100-0.45 UNIT/ML-% IJ SOLN
2850.0000 [IU]/h | INTRAMUSCULAR | Status: DC
  Administered 2017-12-13: 2400 [IU]/h via INTRAVENOUS
  Administered 2017-12-14 – 2017-12-15 (×3): 2700 [IU]/h via INTRAVENOUS
  Administered 2017-12-15: 2850 [IU]/h via INTRAVENOUS
  Filled 2017-12-13 (×9): qty 250

## 2017-12-13 NOTE — Care Management Note (Signed)
Case Management Note  Patient Details  Name: [name] [name] MRN: [id number] Date of Birth: 1955/11/20  Subjective/Objective:  Pt admitted on 11/18/17 with stroke, thyroid mass, and respiratory failure.  PTA, pt independent; has significant other, Curt Bears.                    Action/Plan: Pt currently remains sedated and intubated.  Will continue to follow for discharge planning as pt progresses.  Expected Discharge Date:                  Expected Discharge Plan:  Long Term Acute Care (LTAC)  In-House Referral:     Discharge planning Services  CM Consult  Post Acute Care Choice:  Long Term Acute Care (LTAC) Choice offered to:  Spouse  DME Arranged:    DME Agency:     HH Arranged:    [place] Agency:     Status of Service:  In process, will continue to follow  If discussed at Long Length of Stay Meetings, dates discussed:    Additional Comments:  12/13/17 J. Gar Glance, RN, BSN Pt s/p PEG placement today.  Received CM consult for LTAC today.  Met with pt's significant other, Curt Bears at bedside to further discuss LTAC choice, and she states she would prefer [facility].  She is in full agreement with transition to [facility] for vent weaning and rehab; referral made to [facility] admission coordinator, as requested.  Admission coordinator aware that pt has [employer] only, active 11/22/17.  Will provide updates as available.   Reinaldo Raddle, RN, BSN  Trauma/Neuro ICU Case Manager [phone]

## 2017-12-13 NOTE — Progress Notes (Signed)
Sedation for peg 4 mg versed and 100mg  fentanyl twice. Vitals monitored throughout.

## 2017-12-13 NOTE — Progress Notes (Signed)
Patient continues to tolerate CPAP/PS well. The patient was placed on 14/8 and 40% at 1601. Will continue to monitor

## 2017-12-13 NOTE — Op Note (Signed)
[facility] Patient Name: [name] [name] Procedure Date : 12/13/2017 MRN: [id number] Attending MD: Rikki Spearing, MD Date of Birth: June 01, 1955 CSN: [id number] Age: 62 Admit Type: Inpatient Procedure:                Upper GI endoscopy Indications:              Place PEG because patient is unable to eat, Place                            PEG due to neurological disorder causing impaired                            swallowing, Place PEG because patient is unable to                            eat due to stroke (CVA), Place PEG because patient                            requires ventilator support Providers:                Rikki Spearing, MD, William Dalton, Technician Referring MD:              Medicines:                Fentanyl 200 micrograms IV, Midazolam 4 mg IV Complications:            No immediate complications. Estimated Blood Loss:      Procedure:                Pre-Anesthesia Assessment:                           - Prior to the procedure, a History and Physical                            was performed, and patient medications and                            allergies were reviewed. The patient's tolerance of                            previous anesthesia was also reviewed. The risks                            and benefits of the procedure and the sedation                            options and risks were discussed with the patient.                            All questions were answered, and informed consent                            was obtained. Prior Anticoagulants: The patient has  taken heparin, last dose was day of procedure. ASA                            Grade Assessment: IV - A patient with severe                            systemic disease that is a constant threat to life.                            After reviewing the risks and benefits, the patient                            was deemed in satisfactory condition to undergo the                             procedure.                           After obtaining informed consent, the endoscope was                            passed under direct vision. Throughout the                            procedure, the patient's blood pressure, pulse, and                            oxygen saturations were monitored continuously. The                            [id number] ([id number]) scope was introduced through the                            mouth, and advanced to the second part of duodenum.                            The upper GI endoscopy was accomplished without                            difficulty. The patient tolerated the procedure                            well. Scope In: Scope Out: Findings:      The esophagus was normal.      The examined duodenum was normal.      The entire examined stomach was normal. The patient was placed in the       supine position for PEG placement. The stomach was insufflated to appose       gastric and abdominal walls. A site was located in the body of the       stomach with good manual external pressure for placement. The abdominal       wall was marked and prepped in a sterile manner. The area was       anesthetized with 4 mL of 1%  lidocaine. The trocar needle was introduced       through the abdominal wall and into the stomach under direct endoscopic       view. A snare was introduced through the endoscope and opened in the       gastric lumen. The guide wire was passed through the trocar and into the       open snare. The snare was closed around the guide wire. The endoscope       and snare were removed, pulling the wire out through the mouth. A skin       incision was made at the site of needle insertion. The externally       removable 24 Fr EndoVive Safety gastrostomy tube was lubricated. The       G-tube was tied to the guide wire and pulled through the mouth and into       the stomach. The trocar needle was removed, and the gastrostomy  tube was       pulled out from the stomach through the skin. The external bumper was       attached to the gastrostomy tube, and the tube was cut to remove the       guide wire. The final position of the gastrostomy tube was confirmed by       relook endoscopy, and skin marking noted to be 4.5 cm at the external       bumper. The final tension and compression of the abdominal wall by the       PEG tube and external bumper were checked and revealed that the bumper       was moderately tight and mildly deforming the skin. The feeding tube was       capped, and the tube site cleaned and dressed. Impression:               - Normal esophagus.                           - Normal examined duodenum.                           - Normal stomach.                           - An externally removable PEG placement was                            successfully completed.                           - No specimens collected. Recommendation:           - Please follow the post-PEG recommendations                            including: NPO x4 hrs then water today, may use PEG                            today for meds and water, use PEG today after                            checked by physician  and start using PEG today. Procedure Code(s):        --- Professional ---                           [phone], Esophagogastroduodenoscopy, flexible,                            transoral; with directed placement of percutaneous                            gastrostomy tube Diagnosis Code(s):        --- Professional ---                           R63.3, Feeding difficulties                           Z43.1, Encounter for attention to gastrostomy                           R29.818, Other symptoms and signs involving the                            nervous system                           Z99.11, Dependence on respirator [ventilator] status                           I69.398, Other sequelae of cerebral infarction                            R13.10, Dysphagia, unspecified CPT copyright 2017 American Medical Association. All rights reserved. The codes documented in this report are preliminary and upon coder review may  be revised to meet current compliance requirements. Rikki Spearing, MD Judeth Horn III, MD 12/13/2017 1:20:20 PM This report has been signed electronically. Number of Addenda: 0

## 2017-12-13 NOTE — Progress Notes (Signed)
Pharmacy Antibiotic Note  [name] [name] is a 62 y.o. male admitted on 11/18/2017 with pneumonia.  Pharmacy has been consulted for Vancomycin and Zosyn dosing.  Vanc trough today - 7 mcg/ml - drawn 45 minutes late  Plan: Continue Zosyn 3.375 gm IV q8hr Give an additional Vancomycin 500 mg IV x 1, then Vancomycin 1500 mg Iv q12hr (goal trough 15 - 20 mcg/ml)  Will monitor renal function and C&S. Vancomycin trough when at steady state.  Height: 5\' 10"  (177.8 cm) Weight: (!) 320 lb 1.7 oz (145.2 kg) IBW/kg (Calculated) : 73  Temp (24hrs), Avg:100.4 F (38 C), Min:99.5 F (37.5 C), Max:101 F (38.3 C)  Recent Labs  Lab 12/09/17 0419 12/09/17 2014 12/10/17 0500 12/11/17 0429 12/12/17 0544 12/13/17 0515 12/13/17 1219  WBC 15.9*  --  15.5* 7.6 6.4 6.6  --   CREATININE 0.52* 0.49* 0.46* 0.45* 0.48* 0.49*  --   VANCOTROUGH  --   --   --   --   --   --  7*    Estimated Creatinine Clearance: 139.8 mL/min (A) (by C-G formula based on SCr of 0.49 mg/dL (L)).    No Known Allergies  Antimicrobials: Zosyn 7/19 >>  Vanc 7/19 >>    Microbiology results: 7/15 TA: MRSA and Providencia rettgeri 7/18 TA: Staph aureas (susc pending)  Thank you for allowing pharmacy to be a part of this patient's care.  Corinda Gubler, PharmD, [facility] 12/13/2017 1:31 PM

## 2017-12-13 NOTE — Progress Notes (Signed)
Patient is to have a PEG today.  Very obese.  He seems to understand.  Kathryne Eriksson. Dahlia Bailiff, MD, [place] [phone] Trauma Surgeon

## 2017-12-13 NOTE — Progress Notes (Signed)
PULMONARY / CRITICAL CARE MEDICINE   Name: [name] [name] MRN: [id number] DOB: 03-23-1956    ADMISSION DATE:  11/18/2017 CONSULTATION DATE: 11/18/2017  REFERRING MD: Dr. Johnney Killian  CHIEF COMPLAINT: Stroke  HISTORY OF PRESENT ILLNESS:   62 yo male with hx of A fib off anticoagulation in setting of hemoptysis presented with acute CVA.  Intubated and received thrombolytic.  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  Found to have metastatic thyroid cancer.  SUBJECTIVE: Remains on vent.  VITAL SIGNS: BP 129/79   Pulse 98   Temp (!) 101 F (38.3 C) (Axillary)   Resp (!) 24   Ht 5\' 10"  (1.778 m)   Wt (!) 320 lb 1.7 oz (145.2 kg)   SpO2 93%   BMI 45.93 kg/m   VENTILATOR SETTINGS: Vent Mode: PCV FiO2 (%):  [40 %] 40 % Set Rate:  [20 bmp] 20 bmp PEEP:  [8 cmH20] 8 cmH20 Plateau Pressure:  [19 cmH20-21 cmH20] 19 cmH20  INTAKE / OUTPUT: I/O last 3 completed shifts: In: 7777.4 [I.V.:5605.3; NG/GT:932.7; IV Piggyback:1239.4] Out: 1635 [Urine:1635]  PHYSICAL EXAMINATION:  General - alert Eyes - pupils reactive, moves eyes to communicate ENT - trach site clean Cardiac - irregular Chest - equal breath sounds b/l, no wheezing or rales Abdomen - soft, non tender, + bowel sounds GU - no lesions noted Extremities - 1+ edema Skin - no rashes Lymphatics - no lymphadenopathy Neuro - blinks eyes to communicate, has minimal head movement  LABS: CBC Recent Labs    12/11/17 0429 12/12/17 0544 12/13/17 0515  WBC 7.6 6.4 6.6  HGB 9.3* 8.8* 9.5*  HCT 30.1* 29.0* 31.1*  PLT 283 242 261    Coag's No results for input(s): APTT, INR in the last 72 hours.  BMET Recent Labs    12/11/17 0429 12/12/17 0544 12/13/17 0515  NA 141 142 144  K 3.2* 3.2* 3.4*  CL 108 111 113*  CO2 26 25 25   BUN 15 13 16   CREATININE 0.45* 0.48* 0.49*  GLUCOSE 165* 144* 144*    Electrolytes Recent Labs    12/11/17 0429 12/12/17 0544  12/13/17 0515  CALCIUM 8.5* 8.7* 8.9    Sepsis Markers Recent Labs    12/11/17 0429  PROCALCITON 0.11    ABG Recent Labs    12/11/17 0334 12/13/17 0455  PHART 7.428 7.488*  PCO2ART 39.3 32.8  PO2ART 119* 57.0*    Liver Enzymes No results for input(s): AST, ALT, ALKPHOS, BILITOT, ALBUMIN in the last 72 hours.  Cardiac Enzymes No results for input(s): TROPONINI, PROBNP in the last 72 hours.  Glucose Recent Labs    12/12/17 1009 12/12/17 1153 12/12/17 1608 12/12/17 2030 12/12/17 2309 12/13/17 0309  GLUCAP 175* 178* 157* 210* 202* 138*    Imaging Dg Chest Port 1 View  Result Date: 12/12/2017 CLINICAL DATA:  Dyspnea. EXAM: PORTABLE CHEST 1 VIEW COMPARISON:  12/11/2017 FINDINGS: Tracheostomy tube and right-sided PICC line unchanged enteric tube courses into the region of the stomach as tip is not visualized. Lungs are adequately inflated demonstrate continued evidence of bilateral hazy perihilar opacification likely interstitial edema with subtle improvement. No definite effusion. Stable cardiomegaly. Remainder of the exam is unchanged. IMPRESSION: Stable cardiomegaly with subtle improvement bilateral hazy perihilar opacification likely interstitial edema. Tubes and lines unchanged. Electronically Signed   By: [name] Olp M.D.   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  ASSESSMENT/PLAN:  Acute respiratory  failure. Failure to wean s/p tracheostomy. - full vent support - trach care  HCAP with MRSA, Providencia. - day 4 of vancomycin, zosyn  CVA. - per neurology  Atrial fibrillation. - resume anticoagulation after PEG when okay with CCS  Dysphagia. - for PEG 7/22  Metastatic thyroid cancer. - seen by oncology last 7/18 - not sure what options are for this  DM. - SSI with lantus  Anemia of critical illness. - f/u CBC  DVT prophylaxis - SCDs SUP - protonix Nutrition - tube feeds Goals of care - no CPR, no defibrillation Disposition - likely will need LTAC  D/w Dr. Jeanelle Malling, MD [place] 12/13/2017, 10:34 AM

## 2017-12-13 NOTE — Progress Notes (Signed)
SLP Cancellation Note  Patient Details Name: [name] [name] MRN: [id number] DOB: [birth year]   Cancelled treatment:        Plan was to follow up re: arrangements for Palliative care meeting with MD, Palliative care and SLP to discuss PEG versus comfort care. Noted pt now out of room receiving PEG. Will see next date for intervention with communication.   [name] 12/13/2017, 1:00 PM  [name] Caroli.Ed [employer] [phone]

## 2017-12-13 NOTE — Progress Notes (Signed)
STROKE TEAM PROGRESS NOTE   SUBJECTIVE (INTERVAL HISTORY) His significant other and HPOA is  not at bedside but I spoke to her over the phone.. Patient  Had PEG tube l done today by Dr. Hulen Skains and procedure went well. CCM M.D. Feels patient is likely going to need to go to long-term ventilator facility for weaning  OBJECTIVE Temp:  [99.5 F (37.5 C)-101 F (38.3 C)] 100.7 F (38.2 C) (07/22 1200) Pulse Rate:  [87-122] 94 (07/22 1500) Cardiac Rhythm: Atrial fibrillation (07/22 0800) Resp:  [0-29] 17 (07/22 1500) BP: (93-164)/(49-127) 132/83 (07/22 1500) SpO2:  [93 %-100 %] 99 % (07/22 1505) FiO2 (%):  [40 %] 40 % (07/22 1505) Weight:  [320 lb 1.7 oz (145.2 kg)] 320 lb 1.7 oz (145.2 kg) (07/22 0330)  CBC:  Recent Labs  Lab 12/11/17 0429 12/12/17 0544 12/13/17 0515  WBC 7.6 6.4 6.6  NEUTROABS 6.0  --  4.6  HGB 9.3* 8.8* 9.5*  HCT 30.1* 29.0* 31.1*  MCV 88.8 89.2 89.6  PLT 283 242 678    Basic Metabolic Panel:  Recent Labs  Lab 12/09/17 2014 12/10/17 0500  12/12/17 0544 12/13/17 0515  NA 140 140   < > 142 144  K 3.8 3.6   < > 3.2* 3.4*  CL 105 105   < > 111 113*  CO2 27 26   < > 25 25  GLUCOSE 176* 160*   < > 144* 144*  BUN 21 17   < > 13 16  CREATININE 0.49* 0.46*   < > 0.48* 0.49*  CALCIUM 8.8* 8.6*   < > 8.7* 8.9  MG 1.9 1.9  --   --   --   PHOS <1.0* 2.1*  --   --   --    < > = values in this interval not displayed.    Lipid Panel:     Component Value Date/Time   CHOL 110 11/19/2017 0437   CHOL 119 08/09/2017 1000   TRIG 340 (H) 11/19/2017 0437   HDL 19 (L) 11/19/2017 0437   HDL 29 (L) 08/09/2017 1000   CHOLHDL 5.8 11/19/2017 0437   VLDL 68 (H) 11/19/2017 0437   LDLCALC 23 11/19/2017 0437   LDLCALC 30 08/09/2017 1000   HgbA1c:  Lab Results  Component Value Date   HGBA1C 9.1 (H) 11/19/2017   Urine Drug Screen: No results found for: LABOPIA, COCAINSCRNUR, LABBENZ, AMPHETMU, THCU, LABBARB  Alcohol Level No results found for: [place] I have  personally reviewed the radiological images below and agree with the radiology interpretations.  Ct Angio Head W Or Wo Contrast  Ct Angio Neck W Or Wo Contrast 11/18/2017 Since the study of earlier today, there are only 2 changes. There is new consolidation and volume loss in the right upper lobe. 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 Flow is seen distal to that however, presumably indicating patent posterior communicating arteries.   Ct Angio Head W Or Wo Contrast Ct Angio Neck W Or Wo Contrast 11/18/2017 1. Severe intracranial and extracranial atherosclerotic disease. No emergent large vessel occlusion  2. Severe stenosis right carotid bifurcation due to calcified plaque, estimated 90% diameter stenosis. No significant left carotid stenosis. Severe stenosis cavernous carotid bilaterally.  3. Both vertebral arteries are patent in the neck with severe calcific stenosis V4 segment bilaterally.  4. 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 Small calcified lymph nodes left neck suspicious for metastatic disease  5. Small lung  nodules on the right, possible metastatic disease. Recommend chest CT for further evaluation.   Ct Head Wo Contrast 11/19/2017 1. Expected evolution of brainstem infarct without hemorrhage.  2. No new infarcts.  3. Left vertebral artery and basilar artery stent.  4. Acute on chronic sinus disease may be related to intubation.   Ct Chest W Contrast 11/18/2017 Right upper lobe airspace opacity is noted with air bronchograms concerning for pneumonia or possibly atelectasis. Stable mediastinal adenopathy is noted compared to prior exam. Endotracheal and nasogastric tubes are in grossly good position. Coronary artery calcifications are noted suggesting coronary artery disease. Possible 2.8 cm left thyroid nodule. Thyroid ultrasound is recommended for further evaluation. Aortic Atherosclerosis (ICD10-I70.0).   Mr Brain Wo Contrast 11/19/2017 1.  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 No associated hemorrhage or significant mass effect.  2. Additional punctate acute/early subacute infarctions within left lateral cerebellum and right parietal cortex.  3. Minimal chronic microvascular ischemic changes of the brain and mild parenchymal volume loss.   Ct Head Code Stroke Wo Contrast 11/18/2017 1. No acute intracranial abnormality.  Atherosclerotic calcification  2. ASPECTS is 10   Cerebral Angiogram / Stent  11/18/2017 1. 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 Occluded non dominant RT VBJ. 2. S/P endovascular revascularization of the distal basilar artery  To 90 % patency , and of 30 to 40 % of the LT VBJ with stent placement. Dominant  Lt Pcom.  Transthoracic Echocardiogram - Left ventricle: The cavity size was moderately dilated. Wall thickness was increased in a pattern of severe LVH. Systolic function was normal. The estimated ejection fraction was in the range of 55% to 60%. Wall motion was normal; there were no regional wall motion abnormalities. The study is not technically sufficient to allow evaluation of LV diastolic function. - Aortic valve: Trileaflet. Sclerosis without stenosis. There was no regurgitation. Valve area (Vmax): 2.88 cm^2. - Aorta: Aortic root ML diameter: 41.95 mm (ED). - Aortic root: The aortic root is mildly dilated. - Mitral valve: Calcified annulus. There was trivial regurgitation. - Left atrium: The atrium was mildly dilated. - Right ventricle: The cavity size was mildly dilated. Mildly reduced systolic function. - Inferior vena cava: The vessel was normal in size. The respirophasic diameter changes were in the normal range (>= 50%), consistent with normal central venous pressure. Impressions:  Compared to a prior study in 02/2017, there have been no  significant changes.  Ct Head Wo Contrast 11/24/2017 1. Continued normal expected interval evolution of brainstem infarct. No evidence for hemorrhagic transformation or other complication.  2. No other new intracranial abnormality.  3. Moderate paranasal sinus disease with bilateral mastoid effusions, likely related intubation.   Dg Chest Port 1 View 11/25/2017 Mild vascular congestion. Tubes and lines as described.   Ct Soft Tissue Neck W Contrast 11/26/2017 Aside from the fact that the patient shows endotracheal intubation and the presence of an orogastric tube, and aside from development of bilateral layering pleural effusions with dependent atelectasis, the findings are unchanged since the previous studies. Irregular mass arising from the inferior left thyroid lobe, measuring approximately 5 x 3.4 x 5.5 cm, with invasion of the surrounding tissues worrisome for thyroid carcinoma. Partial encasement of the trachea. Broad surface along the esophagus. Small but likely pathologic nodes in the lower neck and superior mediastinum, some with calcification,  probably involved by metastatic disease.   Dg Chest Port 1 View 11/29/2017 Tube and catheter positions as described without pneumothorax. There is pulmonary vascular congestion with interstitial edema and small pleural effusions. Suspect a degree of underlying congestive heart failure. No airspace consolidation.    Lymph node, needle/core biopsy - METASTATIC PAPILLARY THYROID CARCINOMA.   PHYSICAL EXAM General - morbid obesity,middle aged 62 male, s/p traceostomy on low dose sedation.  Ophthalmologic - fundi not visualized due to noncooperation.  Cardiovascular - irregularly irregular heart rate and rhythm, afib RVR.  Neuro - status post tracheostomy on low dose sedation, eyes open, following commands with vertical greater than horizontal eye movement and eye close and opening, not blinking to visual threat bilaterally, PERRL. Weak  corneal bilaterally, R>L. Positive gag. Facial symmetry not able to test due to ET tube, not able to move tongue or mouth on command. No movement on all extremities even with pain stimulation, DTR diminished and no babinski. Sensation, coordination and gait not tested.   ASSESSMENT/PLAN Mr. Tron Flythe is a 62 y.o. male with PMH of DM, hemoptysis, OSA, HTN, HLD, CAD, afib off Eliquis due to hemoptysis presenting with slurred speech, right sided weakness. Was intubated for hemoptysis prevention while giving tPA. Symptoms worsened and imaging confirmed BA occlusion. S/p IR with revascularization of BA as well as left VA s/p stenting.  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   MRI head - large acute/early subacute infarction within the central pons involving both sides, greater on the right.  CTA H&N - severe vascular athero including BA stenosis, b/l VA stenosis, right ICA proximal 90% stenosis, b/l cavernous ICA stenosis R>L.  Repeat CTA H/N - new distal BA occlusion  DSA - revascularization of the distal basilar artery to 90 % patency , and of 30 to 40 % of the LT VBJ with stent placement.  CT head 11/24/17 showed normal evolution of brainstem infarct, no acute change  2D Echo - EF 55-60%  LDL - 23  HgbA1c - 9.1  VTE prophylaxis - heparin subq  Eliquis (recently stopped) and plavix prior to admission, now  off IV heparin drip.  For peg  Ongoing aggressive stroke risk factor management  Therapy recommendations:  pending  Disposition:  Pending  Palliative care on board  METASTATIC PAPILLARY THYROID  CARCINOMA  CTA neck - right large thyroid mass compressing on trachea.   Thyroid mass larger than CT chest in 08/2016  Evidence of tracheal erosion on bronch at this admission  CT neck with contrast showed thyroid carcinoma, encasement of trachea.    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   oncology Dr Learta Codding on board  Respiratory failure  CCM on board  Still on vent  Wean off sedation as able  S/p Trach this am   PAF  Currently in afib RVR with respiratory distress  On eliquis prior but off due to hemoptysis  On heparin IV now  Continue brilinta due to VA stent  Coreg bid for rate control  Cerebral vascular athero  CTA head and neck showed right ICA proximal 90% stenosis, left ICA proximal athero, b/l cavernous ICA severe athro R>L,  BA severe stenosis, b/l V4 severe athero with right V4 occlusion  S/p left V4 stent and BA revascularization  On brilinta and heparin IV  Hemoptysis, improved  Since 08/2016 as per family  Not sure about etiology  TB ruled out in the past  Likely tracheal erosion from right thyroid mass   CT neck showed thyroid carcinoma, encasement of trachea.   CCM on board  Consider tertiary center transfer for mass debulking or tracheal resection  Sepsis  101.8->102.2->101.6->101.5  Leukocytosis WBC 10.7->12.6->15.9->15.5  Hypotension - on levophed  On cefepime -> zosyn and vanco now  CXR - bibasilar atelectasis, CHF  CHF  Off lasix   CXR showed persistent CHF  Off coreg due to hypotension  Gentle hydration  Hypotension  With maxed on levophed  Coreg and lasix discontinued . Long term BP goal normotensive.  Hyperlipidemia  Lipid lowering medication PTA: Crestor 10 mg daily  LDL 23, goal < 70  On Crestor  Continue statin at discharge  Diabetes  HgbA1c 9.1, goal < 7.0  Uncontrolled  continued on insulin drip  Continued hyperglycemia  CCM on board   CBG  monitoring  Dysphagia   Put on cortrak  Resume TF after  Other Stroke Risk Factors  Advanced age  Former cigarette smoker - quit, 40 pack year history  Morbid obesity, Body mass index is 45.93 kg/m.    Hx stroke/TIA  Coronary artery disease   PAD with claudication  OSA on CPAP  Other Active Problems  Palliative care on board  I had a long discussion with the patient's health power of attorney over the phonee and answered questions about his care.  will start heparin drip after 8 hours from the procedure. start warfarin tomorrow Discussed with Dr. Halford Chessman and Dr. Hulen Skains.This patient is critically ill and at significant risk of neurological worsening, death and care requires constant monitoring of vital signs, hemodynamics,respiratory and cardiac monitoring, extensive review of multiple databases, frequent neurological assessment, discussion with family, other specialists and medical decision making of high complexity. I spent 35 minutes of neurocritical care time  in the care of  this patient.   Antony Contras, MD Stroke Neurology 12/13/2017 3:23 PM    To contact Stroke Continuity provider, please refer to [email or web address] After hours, contact General Neurology

## 2017-12-14 ENCOUNTER — Inpatient Hospital Stay (HOSPITAL_COMMUNITY): Payer: 59

## 2017-12-14 LAB — CBC
HEMATOCRIT: 33.8 % — AB (ref 39.0–52.0)
Hemoglobin: 10.1 g/dL — ABNORMAL LOW (ref 13.0–17.0)
MCH: 27.1 pg (ref 26.0–34.0)
MCHC: 29.9 g/dL — ABNORMAL LOW (ref 30.0–36.0)
MCV: 90.6 fL (ref 78.0–100.0)
Platelets: 272 10*3/uL (ref 150–400)
RBC: 3.73 MIL/uL — ABNORMAL LOW (ref 4.22–5.81)
RDW: 14.5 % (ref 11.5–15.5)
WBC: 7.6 10*3/uL (ref 4.0–10.5)

## 2017-12-14 LAB — HEPARIN LEVEL (UNFRACTIONATED)
HEPARIN UNFRACTIONATED: 0.23 [IU]/mL — AB (ref 0.30–0.70)
Heparin Unfractionated: 0.44 IU/mL (ref 0.30–0.70)

## 2017-12-14 LAB — GLUCOSE, CAPILLARY
GLUCOSE-CAPILLARY: 203 mg/dL — AB (ref 70–99)
GLUCOSE-CAPILLARY: 188 mg/dL — AB (ref 70–99)
GLUCOSE-CAPILLARY: 189 mg/dL — AB (ref 70–99)
GLUCOSE-CAPILLARY: 208 mg/dL — AB (ref 70–99)
Glucose-Capillary: 193 mg/dL — ABNORMAL HIGH (ref 70–99)
Glucose-Capillary: 202 mg/dL — ABNORMAL HIGH (ref 70–99)

## 2017-12-14 LAB — BASIC METABOLIC PANEL
ANION GAP: 10 (ref 5–15)
BUN: 18 mg/dL (ref 8–23)
CO2: 22 mmol/L (ref 22–32)
CREATININE: 0.49 mg/dL — AB (ref 0.61–1.24)
Calcium: 9 mg/dL (ref 8.9–10.3)
Chloride: 112 mmol/L — ABNORMAL HIGH (ref 98–111)
GFR calc Af Amer: >60 mL/min (ref >60)
GLUCOSE: 215 mg/dL — AB (ref 70–99)
Potassium: 3.5 mmol/L (ref 3.5–5.1)
Sodium: 144 mmol/L (ref 135–145)

## 2017-12-14 LAB — PROTIME-INR
INR: 1.28
Prothrombin Time: 15.9 seconds — ABNORMAL HIGH (ref 11.4–15.2)

## 2017-12-14 LAB — MAGNESIUM: Magnesium: 1.9 mg/dL (ref 1.7–2.4)

## 2017-12-14 MED ORDER — WARFARIN - PHARMACIST DOSING INPATIENT
Freq: Every day | Status: DC
Start: 2017-12-14 — End: 2017-12-15
  Administered 2017-12-14: 23:00:00

## 2017-12-14 MED ORDER — WARFARIN SODIUM 7.5 MG PO TABS
7.5000 mg | ORAL_TABLET | Freq: Once | ORAL | Status: AC
  Administered 2017-12-14: 7.5 mg via ORAL
  Filled 2017-12-14 (×2): qty 1

## 2017-12-14 MED ORDER — SODIUM CHLORIDE 0.9 % IV SOLN
INTRAVENOUS | Status: DC
  Administered 2017-12-14: 09:00:00 via INTRAVENOUS

## 2017-12-14 MED ORDER — METOPROLOL TARTRATE 25 MG/10 ML ORAL SUSPENSION
25.0000 mg | Freq: Two times a day (BID) | ORAL | Status: DC
  Administered 2017-12-14 – 2017-12-15 (×3): 25 mg
  Filled 2017-12-14 (×3): qty 10

## 2017-12-14 MED ORDER — SODIUM CHLORIDE 0.9 % IV SOLN
1.0000 g | Freq: Three times a day (TID) | INTRAVENOUS | Status: DC
  Administered 2017-12-14 – 2017-12-15 (×5): 1 g via INTRAVENOUS
  Filled 2017-12-14 (×7): qty 1

## 2017-12-14 MED ORDER — CHLORHEXIDINE GLUCONATE CLOTH 2 % EX PADS
6.0000 | MEDICATED_PAD | Freq: Every day | CUTANEOUS | Status: DC
  Administered 2017-12-15: 6 via TOPICAL

## 2017-12-14 MED ORDER — POTASSIUM CHLORIDE 20 MEQ/15ML (10%) PO SOLN
40.0000 meq | Freq: Once | ORAL | Status: AC
  Administered 2017-12-14: 40 meq
  Filled 2017-12-14: qty 30

## 2017-12-14 NOTE — Progress Notes (Signed)
PULMONARY / CRITICAL CARE MEDICINE   Name: [name] [name] MRN: [id number] DOB: [birth year]    ADMISSION DATE:  11/18/2017 CONSULTATION DATE: 11/18/2017  REFERRING MD: Dr. Johnney Killian  CHIEF COMPLAINT: Stroke  HISTORY OF PRESENT ILLNESS:   62 yo male with hx of A fib off anticoagulation in setting of hemoptysis presented with acute CVA.  Intubated and received thrombolytic.  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  Found to have metastatic thyroid cancer.  SUBJECTIVE: Did some pressure support yesterday.  Had PEG placed.  Still has some secretions.  VITAL SIGNS: BP (!) 145/87   Pulse 82   Temp (!) 101.4 F (38.6 C) (Axillary)   Resp 20   Ht 5\' 10"  (1.778 m)   Wt (!) 326 lb 4.5 oz (148 kg)   SpO2 99%   BMI 46.82 kg/m   VENTILATOR SETTINGS: Vent Mode: PCV FiO2 (%):  [40 %] 40 % Set Rate:  [20 bmp] 20 bmp PEEP:  [8 cmH20] 8 cmH20 Pressure Support:  [14 cmH20] 14 cmH20 Plateau Pressure:  [17 cmH20-25 cmH20] 25 cmH20  INTAKE / OUTPUT: I/O last 3 completed shifts: In: 8121 [I.V.:4907.5; Other:50; NG/GT:1278.3; IV Piggyback:1885.2] Out: 1940 [WUJWJ:1914]  PHYSICAL EXAMINATION:  General - alert Eyes - pupils reactive ENT - trach site clean Cardiac - regular rate/rhythm, tachycardic, no murmur Chest - equal breath sounds b/l, no wheezing or rales Abdomen - soft, non tender, + bowel sounds, no hepatosplenomegaly, PEG site clean GU - no lesions noted Extremities - 1+ edema, no cyanosis, or clubbing Skin - no rashes Lymphatics - no lymphadenopathy Neuro - follows commands, increase head movements and now able to shake head for yes/no responses  LABS: CBC Recent Labs    12/12/17 0544 12/13/17 0515 12/14/17 0433  WBC 6.4 6.6 7.6  HGB 8.8* 9.5* 10.1*  HCT 29.0* 31.1* 33.8*  PLT 242 261 272    Coag's No results for input(s): APTT, INR in the last 72 hours.  BMET Recent Labs    12/12/17 0544 12/13/17 0515  12/14/17 0433  NA 142 144 144  K 3.2* 3.4* 3.5  CL 111 113* 112*  CO2 25 25 22   BUN 13 16 18   CREATININE 0.48* 0.49* 0.49*  GLUCOSE 144* 144* 215*    Electrolytes Recent Labs    12/12/17 0544 12/13/17 0515 12/14/17 0433  CALCIUM 8.7* 8.9 9.0  MG  --   --  1.9    Sepsis Markers No results for input(s): PROCALCITON, O2SATVEN in the last 72 hours.  Invalid input(s): LACTICACIDVEN  ABG Recent Labs    12/13/17 0455  PHART 7.488*  PCO2ART 32.8  PO2ART 57.0*    Liver Enzymes No results for input(s): AST, ALT, ALKPHOS, BILITOT, ALBUMIN in the last 72 hours.  Cardiac Enzymes No results for input(s): TROPONINI, PROBNP in the last 72 hours.  Glucose Recent Labs    12/13/17 0809 12/13/17 1217 12/13/17 1521 12/13/17 1949 12/13/17 2338 12/14/17 0340  GLUCAP 136* 139* 126* 175* 223* 203*    Imaging Dg Chest Port 1 View  Result Date: 12/14/2017 CLINICAL DATA:  62 year old male with respiratory failure. Subsequent encounter. EXAM: PORTABLE CHEST 1 VIEW COMPARISON:  12/12/2017 chest x-ray. FINDINGS: Tracheostomy tube tip midline 7.8 cm above the carina. Right central line tip mid to distal superior vena cava level. No pneumothorax. Diffuse slightly asymmetric airspace disease has progressed slightly since the prior exam (right lower lobe). Small left-sided pleural effusion. Findings may reflect changes of pulmonary edema  superimposed upon chronic changes although basilar infiltrates not excluded in the proper clinical setting. Cardiomegaly. Calcified aorta. Feeding tube is been removed. IMPRESSION: Diffuse slightly asymmetric airspace disease has progressed slightly since the prior exam (right lower lobe). Small left-sided pleural effusion. Findings may reflect changes of pulmonary edema superimposed upon chronic changes although basilar infiltrates not excluded in the proper clinical setting. Aortic Atherosclerosis (ICD10-I70.0). Electronically Signed   By: Genia Del M.D.    On: 12/14/2017 07:14   Dg Chest Port 1 View  Result Date: 12/12/2017 CLINICAL DATA:  Dyspnea. EXAM: PORTABLE CHEST 1 VIEW COMPARISON:  12/11/2017 FINDINGS: Tracheostomy tube and right-sided PICC line unchanged enteric tube courses into the region of the stomach as tip is not visualized. Lungs are adequately inflated demonstrate continued evidence of bilateral hazy perihilar opacification likely interstitial edema with subtle improvement. No definite effusion. Stable cardiomegaly. Remainder of the exam is unchanged. IMPRESSION: Stable cardiomegaly with subtle improvement bilateral hazy perihilar opacification likely interstitial edema. Tubes and lines unchanged. Electronically Signed   By: [name] Olp M.D.   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  He has some improvement in head movements, mental status intact, and is starting to tolerate some pressure support weaning.  Good candidate for LTAC placement.  ASSESSMENT/PLAN:  Acute respiratory failure. Failure to wean s/p tracheostomy by ENT. - full vent support - trach care - wean off precedex to keep RASS goal 0  HCAP with MRSA, Providencia. - still having fever, and respiratory secretions - day 5 of vancomycin - will change from zosyn to fortaz 7/23  Large pontine infarct, punctate Lt cerebellar and Rt MCA/ACA infarcts. S/p  revascularization of distal basilar artery, and stent placement to Lt vertebrobasilar junction. CVA s/p stenting. - ASA, brilinta  Atrial fibrillation, HLD. - heparin gtt >> defer to neuro about transitioning to enteral anticoagulation - crestor - add lopressor 7/23  Hypokalemia. - replace as needed - f/u BMET intermittently  Dysphagia. - s/p PEG 7/22 by CCS  Metastatic thyroid cancer. - seen by oncology - if neuro and respiratory status improve, then could consider surgery and radioablation  DM. - SSI with lantus  Anemia of critical illness. - f/u CBC intermittently  DVT prophylaxis - heparin gtt SUP - protonix Nutrition - tube feeds Goals of care - no CPR, no defibrillation Disposition - case manager looking into LTAC  Chesley Mires, MD [place] 12/14/2017, 7:38 AM

## 2017-12-14 NOTE — Progress Notes (Signed)
Patient ID: [name] [name], male   DOB: 31-Aug-1955, 62 y.o.   MRN: [id number] Abd soft, PEG in place TF running Please keep binder at all times to protect PEG I spoke with his family  Recall Trauma PRN  Patient examined and I agree with the assessment and plan  Georganna Skeans, MD, MPH, FACS Trauma: [phone] General Surgery: [phone]  12/14/2017 8:47 AM

## 2017-12-14 NOTE — Progress Notes (Signed)
Inpatient Diabetes Program Recommendations  AACE/ADA: New Consensus Statement on Inpatient Glycemic Control (2015)  Target Ranges:  Prepandial:   less than 140 mg/dL      Peak postprandial:   less than 180 mg/dL (1-2 hours)      Critically ill patients:  140 - 180 mg/dL   Lab Results  Component Value Date   GLUCAP 189 (H) 12/14/2017   HGBA1C 9.1 (H) 11/19/2017    Review of Glycemic ControlResults for JAYKE, CAUL (MRN [id number]) as of 12/14/2017 12:01  Ref. Range 12/13/2017 19:49 12/13/2017 23:38 12/14/2017 03:40 12/14/2017 08:05 12/14/2017 11:44  Glucose-Capillary Latest Ref Range: 70 - 99 mg/dL 175 (H) 223 (H) 203 (H) 188 (H) 189 (H)    Inpatient Diabetes Program Recommendations:   May consider d/c of Lantus and add Levemir 8 units bid.    Thanks  Adah Perl, RN, BC-ADM Inpatient Diabetes Coordinator Pager [phone] (8a-5p)

## 2017-12-14 NOTE — Progress Notes (Signed)
Patient's fio2 was increased to  60% as he dropped in low 90's with HR in 160-200's. Will continue to monitor for ability to wean.

## 2017-12-14 NOTE — Progress Notes (Signed)
ANTICOAGULATION CONSULT NOTE  Pharmacy Consult for heparin Indication: atrial fibrillation and stroke  No Known Allergies  Patient Measurements: Height: 5\' 10"  (177.8 cm) Weight: (!) 326 lb 4.5 oz (148 kg) IBW/kg (Calculated) : 73  Vital Signs: Temp: 101.6 F (38.7 C) (07/23 1300) Temp Source: Axillary (07/23 1300) BP: 141/81 (07/23 1300) Pulse Rate: 97 (07/23 1300)  Labs: Recent Labs    12/12/17 0544 12/12/17 1956 12/13/17 0515 12/14/17 0433 12/14/17 1400  HGB 8.8*  --  9.5* 10.1*  --   HCT 29.0*  --  31.1* 33.8*  --   PLT 242  --  261 272  --   HEPARINUNFRC 0.24* 0.19*  --  0.23* 0.44  CREATININE 0.48*  --  0.49* 0.49*  --     Estimated Creatinine Clearance: 141.3 mL/min (A) (by C-G formula based on SCr of 0.49 mg/dL (L)).  Assessment: 62 y.o. male with h/o Afib and new CVA, now s/p PEG placement, for heparin    Heparin level 0.44  Goal of Therapy:  Heparin level 0.3 - 0.5 units/ml Monitor platelets by anticoagulation protocol: Yes   Plan:  Continue Heparin 2700 units/hr Check heparin level with am labs   Alanda Slim, PharmD, [facility] 12/14/2017 3:05 PM

## 2017-12-14 NOTE — Progress Notes (Signed)
ANTICOAGULATION CONSULT NOTE  Pharmacy Consult for heparin + warfarin Indication: atrial fibrillation and stroke  No Known Allergies  Patient Measurements: Height: 5\' 10"  (177.8 cm) Weight: (!) 326 lb 4.5 oz (148 kg) IBW/kg (Calculated) : 73  Vital Signs: Temp: 100.1 F (37.8 C) (07/23 1800) Temp Source: Axillary (07/23 1800) BP: 125/80 (07/23 1941) Pulse Rate: 107 (07/23 1941)  Labs: Recent Labs    12/12/17 0544 12/12/17 1956 12/13/17 0515 12/14/17 0433 12/14/17 1400 12/14/17 1858  HGB 8.8*  --  9.5* 10.1*  --   --   HCT 29.0*  --  31.1* 33.8*  --   --   PLT 242  --  261 272  --   --   LABPROT  --   --   --   --   --  15.9*  INR  --   --   --   --   --  1.28  HEPARINUNFRC 0.24* 0.19*  --  0.23* 0.44  --   CREATININE 0.48*  --  0.49* 0.49*  --   --     Estimated Creatinine Clearance: 141.3 mL/min (A) (by C-G formula based on SCr of 0.49 mg/dL (L)).  Assessment: 62 y.o. male with h/o Afib and new CVA, now s/p PEG placement on 7/22, for heparin. Pharmacy consulted to start warfarin for atrial fibrillation.       Remains on heparin infusion. Was previously on plavix and apixaban PTA (per med rec, has been off apixaban for two weeks). INR today is 1.28. Hgb 10.1, plt 272 this morning. Currently receiving tube feeds. On concurrent ticagrelor which can increase risk of bleeding, could consider using plavix while on anticoagulation with warfarin.   Goal of Therapy:  INR 2-3 Heparin level 0.3 - 0.5 units/ml Monitor platelets by anticoagulation protocol: Yes   Plan:  Order warfarin 7.5 mg once Continue heparin infusion at 2700 units/hr Check heparin level, INR, and CBC with AM labs   Doylene Canard, PharmD Clinical Pharmacist  Pager: [phone] Phone: [phone] 12/14/2017 8:12 PM

## 2017-12-14 NOTE — Progress Notes (Signed)
Daily Progress Note   Patient Name: [name] [name]       Date: 12/14/2017 DOB: 1955-08-10  Age: 62 y.o. MRN#: [id number] Attending Physician: Garvin Fila, MD Primary Care Physician: Lorella Nimrod, MD Admit Date: 11/18/2017  Reason for Consultation/Follow-up: Establishing goals of care  Subjective: More alert and blinking to yes/no.    Length of Stay: 26  Current Medications: Scheduled Meds:  . aspirin  81 mg Per Tube Daily  . chlorhexidine gluconate (MEDLINE KIT)  15 mL Mouth Rinse BID  . Chlorhexidine Gluconate Cloth  6 each Topical Q0600  . docusate  100 mg Per Tube BID  . feeding supplement (PRO-STAT SUGAR FREE 64)  30 mL Per Tube TID  . insulin aspart  0-15 Units Subcutaneous Q4H  . insulin glargine  8 Units Subcutaneous QHS  . mouth rinse  15 mL Mouth Rinse 10 times per day  . metoprolol tartrate  25 mg Per Tube Q12H  . pantoprazole sodium  40 mg Per Tube Daily  . rosuvastatin  20 mg Per Tube q1800  . sodium chloride flush  10-40 mL Intracatheter Q12H  . ticagrelor  90 mg Oral BID    Continuous Infusions: . sodium chloride Stopped (12/14/17 0431)  . sodium chloride 10 mL/hr at 12/14/17 0843  . cefTAZidime (FORTAZ)  IV 1 g (12/14/17 0849)  . dexmedetomidine (PRECEDEX) IV infusion 0.3 mcg/kg/hr (12/14/17 0700)  . feeding supplement (VITAL HIGH PROTEIN) 1,000 mL (12/13/17 1541)  . fentaNYL infusion INTRAVENOUS 75 mcg/hr (12/13/17 2200)  . heparin 2,700 Units/hr (12/14/17 1018)  . vancomycin Stopped (12/14/17 0355)    PRN Meds: sodium chloride, [DISCONTINUED] acetaminophen **OR** acetaminophen (TYLENOL) oral liquid 160 mg/5 mL **OR** acetaminophen, bisacodyl, fentaNYL, fentaNYL (SUBLIMAZE) injection, ipratropium-albuterol, lidocaine (PF), metoprolol tartrate, midazolam,  sodium chloride flush  Physical Exam  Constitutional: He appears well-developed. He appears lethargic. He is intubated.  Cardiovascular: An irregularly irregular rhythm present. Tachycardia present.  Pulmonary/Chest: No apnea and no tachypnea. He is intubated. No respiratory distress.  Tolerating pressure support/weaning mode well via trach  Abdominal: Soft. Normal appearance.  Neurological: He appears lethargic.  Orientation unable to fully assess. Difficult to assess retention of information provided.   Nursing note and vitals reviewed.           Vital Signs: BP (!) 145/87  Pulse 82   Temp (!) 101.4 F (38.6 C) (Axillary)   Resp 20   Ht _0  (1.778 m)   Wt (!) 148 kg (326 lb 4.5 oz)   SpO2 100%   BMI 46.82 kg/m  SpO2: SpO2: 100 % O2 Device: O2 Device: Ventilator O2 Flow Rate:    Intake/output summary:   Intake/Output Summary (Last 24 hours) at 12/14/2017 1049 Last data filed at 12/14/2017 0700 Gross per 24 hour  Intake 3407.4 ml  Output 1100 ml  Net 2307.4 ml   LBM: Last BM Date: 12/13/17 Baseline Weight: Weight: (!) 141.6 kg (312 lb 2.7 oz) Most recent weight: Weight: (!) 148 kg (326 lb 4.5 oz)       Palliative Assessment/Data:    Flowsheet Rows     Most Recent Value  Intake Tab  Referral Department  Critical care  Unit at Time of Referral  ICU  Palliative Care Primary Diagnosis  Neurology  Date Notified  12/02/17  Palliative Care Type  New Palliative care  Reason for referral  Clarify Goals of Care  Date of Admission  11/18/17  Date first seen by Palliative Care  12/03/17  # of days Palliative referral response time  1 Day(s)  # of days IP prior to Palliative referral  14  Clinical Assessment  Palliative Performance Scale Score  30%  Pain Max last 24 hours  Not able to report  Pain Min Last 24 hours  Not able to report  Dyspnea Max Last 24 Hours  Not able to report  Dyspnea Min Last 24 hours  Not able to report  Nausea Max Last 24 Hours  Not able  to report  Nausea Min Last 24 Hours  Not able to report  Anxiety Max Last 24 Hours  Not able to report  Anxiety Min Last 24 Hours  Not able to report  Other Max Last 24 Hours  Not able to report  Psychosocial & Spiritual Assessment  Palliative Care Outcomes  Patient/Family meeting held?  Yes  Who was at the meeting?  SO, 3 children, 2 step sons, DIL, brother  Palliative Care follow-up planned  Yes, Facility      Patient Active Problem List   Diagnosis Date Noted  . Leukocytosis   . Tracheostomy care ([place])   . Ventilator dependence ([place])   . Fever   . Hyperglycemia   . Goals of care, counseling/discussion   . Palliative care by specialist   . Thyroid mass   . Stroke (cerebrum) (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 Acute respiratory failure ([place])   . Claudication in peripheral vascular disease ([place]) 04/05/2017  . Hyperlipidemia LDL goal <70   . Diabetes mellitus due to underlying condition with unspecified complications ([place]) 78/58/8502  . PAF (paroxysmal atrial fibrillation) ([place]) 02/22/2017  . History of smoking 02/22/2017  . Atrial fibrillation with RVR ([place]) 02/19/2017  . Upper airway cough syndrome 01/15/2017  . CAD (coronary artery disease) 09/16/2016  . Essential hypertension 09/11/2016  . Weight loss   . Night sweats   . OSA (obstructive sleep apnea)   . Morbid obesity due to excess calories ([place])   . Lung nodules   . Hemoptysis 08/23/2016    Palliative Care Assessment & Plan   HPI: 62 y.o. male  with past medical history of HTN, DM2, a-fib ( off AC 2/2 hemoptysis), hemoptysis, HLD admitted on 11/18/2017 with left sided weakness, slurred speech.   He was given TPA and referred for  a cerebral arteriogram. He underwent endovascular revascularization of the distal basilar artery on 6/27. He was intubated for airway protection in the setting of hemoptysis 6/27. There was evidence of bleeding and erosion at 3 cm below the vocal cords.  His  stroke symptoms progressed in the hospital with resultant quadriplegia, a 6th nerve palsy, and 9 nystagmus. 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 An MRI of the brain revealed an acute/subacute infarct in the central pons.  He was taken off of Eliquis prior to hospital admission secondary to hemoptysis (thought r/t thyroid malignancy).  A CT of the neck on 11/18/2017 revealed a 2.5 x 4 cm left thyroid mass with small calcified left neck lymph node suspicious for metastatic disease. A CT of the chest on 11/18/2017 revealed right upper lobe airspace disease, mediastinal adenopathy, and a 2.8 cm left thyroid nodule.  He was referred for an ultrasound-guided biopsy of a right cervical lymph node on 11/29/2017. The pathology revealed metastatic papillary thyroid carcinoma.  Trach placed 7/18 and working on weaning. PEG place 7/22.   Assessment: I met at bedside with Mr. Mchugh. No family at bedside. He is a little lethargic but does interact with me and blinking eyes 1 for yes and 2 for now fairly consistent. He does tire easily. He indicates by blink that he does not understand what is going on with him medically. I simply explained trach/PEG and that we are dealing with this situation d/t stroke he has had. I also explained that he also has thyroid cancer and that his medical team has been working closely with him and his family on what to do next for him. Explained I am from palliative and have been trying to support his family during this process. I reassure him that we will continue to take good care of him. I encouraged him to get some rest.   Recommendations/Plan:  Per PCCM.   Goals of Care and Additional Recommendations:  Limitations on Scope of Treatment: TBD  Code Status:  Full code  Prognosis:   Overall prognosis very poor but will depend on aggressiveness of care. Overall QOL expected to be extremely poor.   Discharge Planning:  To Be Determined  Care plan was discussed with RN.    Thank you for allowing the Palliative Medicine Team to assist in the care of this patient.   Total Time 7mn Prolonged Time Billed  no       Greater than 50%  of this time was spent counseling and coordinating care related to the above assessment and plan.  AVinie Sill NP Palliative Medicine Team Pager # [phone](M-F 8a-5p) Team Phone # [phone](Nights/Weekends)

## 2017-12-14 NOTE — Progress Notes (Signed)
STROKE TEAM PROGRESS NOTE   SUBJECTIVE (INTERVAL HISTORY) His significant other and HPOA is  not at bedside . Patient  is tolerating PEG tube feeding well so far.  He has been on weaning mode on the ventilator since this morning.   OBJECTIVE Temp:  [99.3 F (37.4 C)-102 F (38.9 C)] 101.4 F (38.6 C) (07/23 1600) Pulse Rate:  [82-126] 113 (07/23 1600) Cardiac Rhythm: Atrial fibrillation (07/23 0800) Resp:  [0-31] 0 (07/23 1600) BP: (133-178)/(73-97) 133/73 (07/23 1600) SpO2:  [94 %-100 %] 99 % (07/23 1600) FiO2 (%):  [40 %-60 %] 50 % (07/23 1535) Weight:  [326 lb 4.5 oz (148 kg)] 326 lb 4.5 oz (148 kg) (07/23 0456)  CBC:  Recent Labs  Lab 12/11/17 0429  12/13/17 0515 12/14/17 0433  WBC 7.6   < > 6.6 7.6  NEUTROABS 6.0  --  4.6  --   HGB 9.3*   < > 9.5* 10.1*  HCT 30.1*   < > 31.1* 33.8*  MCV 88.8   < > 89.6 90.6  PLT 283   < > 261 272   < > = values in this interval not displayed.    Basic Metabolic Panel:  Recent Labs  Lab 12/09/17 2014 12/10/17 0500  12/13/17 0515 12/14/17 0433  NA 140 140   < > 144 144  K 3.8 3.6   < > 3.4* 3.5  CL 105 105   < > 113* 112*  CO2 27 26   < > 25 22  GLUCOSE 176* 160*   < > 144* 215*  BUN 21 17   < > 16 18  CREATININE 0.49* 0.46*   < > 0.49* 0.49*  CALCIUM 8.8* 8.6*   < > 8.9 9.0  MG 1.9 1.9  --   --  1.9  PHOS <1.0* 2.1*  --   --   --    < > = values in this interval not displayed.    Lipid Panel:     Component Value Date/Time   CHOL 110 11/19/2017 0437   CHOL 119 08/09/2017 1000   TRIG 340 (H) 11/19/2017 0437   HDL 19 (L) 11/19/2017 0437   HDL 29 (L) 08/09/2017 1000   CHOLHDL 5.8 11/19/2017 0437   VLDL 68 (H) 11/19/2017 0437   LDLCALC 23 11/19/2017 0437   LDLCALC 30 08/09/2017 1000   HgbA1c:  Lab Results  Component Value Date   HGBA1C 9.1 (H) 11/19/2017   Urine Drug Screen: No results found for: LABOPIA, COCAINSCRNUR, LABBENZ, AMPHETMU, THCU, LABBARB  Alcohol Level No results found for: [place] I have  personally reviewed the radiological images below and agree with the radiology interpretations.  Ct Angio Head W Or Wo Contrast  Ct Angio Neck W Or Wo Contrast 11/18/2017 Since the study of earlier today, there are only 2 changes. There is new consolidation and volume loss in the right upper lobe. 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 Flow is seen distal to that however, presumably indicating patent posterior communicating arteries.   Ct Angio Head W Or Wo Contrast Ct Angio Neck W Or Wo Contrast 11/18/2017 1. Severe intracranial and extracranial atherosclerotic disease. No emergent large vessel occlusion  2. Severe stenosis right carotid bifurcation due to calcified plaque, estimated 90% diameter stenosis. No significant left carotid stenosis. Severe stenosis cavernous carotid bilaterally.  3. Both vertebral arteries are patent in the neck with severe calcific stenosis V4 segment bilaterally.  4. 2.5 x 4 cm left thyroid  mass highly suspicious for carcinoma. Small calcified lymph nodes left neck suspicious for metastatic disease  5. Small lung nodules on the right, possible metastatic disease. Recommend chest CT for further evaluation.   Ct Head Wo Contrast 11/19/2017 1. Expected evolution of brainstem infarct without hemorrhage.  2. No new infarcts.  3. Left vertebral artery and basilar artery stent.  4. Acute on chronic sinus disease may be related to intubation.   Ct Chest W Contrast 11/18/2017 Right upper lobe airspace opacity is noted with air bronchograms concerning for pneumonia or possibly atelectasis. Stable mediastinal adenopathy is noted compared to prior exam. Endotracheal and nasogastric tubes are in grossly good position. Coronary artery calcifications are noted suggesting coronary artery disease. Possible 2.8 cm left thyroid nodule. Thyroid ultrasound is recommended for further evaluation. Aortic Atherosclerosis (ICD10-I70.0).   Mr Brain Wo Contrast 11/19/2017 1.  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 No associated hemorrhage or significant mass effect.  2. Additional punctate acute/early subacute infarctions within left lateral cerebellum and right parietal cortex.  3. Minimal chronic microvascular ischemic changes of the brain and mild parenchymal volume loss.   Ct Head Code Stroke Wo Contrast 11/18/2017 1. No acute intracranial abnormality.  Atherosclerotic calcification  2. ASPECTS is 10   Cerebral Angiogram / Stent  11/18/2017 1. 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 Occluded non dominant RT VBJ. 2. S/P endovascular revascularization of the distal basilar artery  To 90 % patency , and of 30 to 40 % of the LT VBJ with stent placement. Dominant  Lt Pcom.  Transthoracic Echocardiogram - Left ventricle: The cavity size was moderately dilated. Wall thickness was increased in a pattern of severe LVH. Systolic function was normal. The estimated ejection fraction was in the range of 55% to 60%. Wall motion was normal; there were no regional wall motion abnormalities. The study is not technically sufficient to allow evaluation of LV diastolic function. - Aortic valve: Trileaflet. Sclerosis without stenosis. There was no regurgitation. Valve area (Vmax): 2.88 cm^2. - Aorta: Aortic root ML diameter: 41.95 mm (ED). - Aortic root: The aortic root is mildly dilated. - Mitral valve: Calcified annulus. There was trivial regurgitation. - Left atrium: The atrium was mildly dilated. - Right ventricle: The cavity size was mildly dilated. Mildly reduced systolic function. - Inferior vena cava: The vessel was normal in size. The respirophasic diameter changes were in the normal range (>= 50%), consistent with normal central venous pressure. Impressions:  Compared to a prior study in 02/2017, there have been no  significant changes.  Ct Head Wo Contrast 11/24/2017 1. Continued normal expected interval evolution of brainstem infarct. No evidence for hemorrhagic transformation or other complication.  2. No other new intracranial abnormality.  3. Moderate paranasal sinus disease with bilateral mastoid effusions, likely related intubation.   Dg Chest Port 1 View 11/25/2017 Mild vascular congestion. Tubes and lines as described.   Ct Soft Tissue Neck W Contrast 11/26/2017 Aside from the fact that the patient shows endotracheal intubation and the presence of an orogastric tube, and aside from development of bilateral layering pleural effusions with dependent atelectasis, the findings are unchanged since the previous studies. Irregular mass arising from the inferior left thyroid lobe, measuring approximately 5 x 3.4 x 5.5 cm, with invasion of the surrounding tissues worrisome for thyroid carcinoma. Partial encasement of the trachea. Broad  surface along the esophagus. Small but likely pathologic nodes in the lower neck and superior mediastinum, some with calcification, probably involved by metastatic disease.   Dg Chest Port 1 View 11/29/2017 Tube and catheter positions as described without pneumothorax. There is pulmonary vascular congestion with interstitial edema and small pleural effusions. Suspect a degree of underlying congestive heart failure. No airspace consolidation.    Lymph node, needle/core biopsy - METASTATIC PAPILLARY THYROID CARCINOMA.   PHYSICAL EXAM General - morbid obesity,middle aged Caucasian male, s/p tracheostomy on low dose sedation.  Ophthalmologic - fundi not visualized due to noncooperation.  Cardiovascular - irregularly irregular heart rate and rhythm, afib RVR.  Neuro - status post tracheostomy on low dose sedation, eyes open, following commands with vertical greater than horizontal eye movement and eye close and opening, not blinking to visual threat bilaterally, PERRL. Weak  corneal bilaterally, R>L. Positive gag. Facial symmetry not able to test due to ET tube, not able to move tongue or mouth on command. No movement on all extremities even with pain stimulation, DTR diminished and no babinski. Sensation, coordination and gait not tested.   ASSESSMENT/PLAN Mr. Alexxander Kurt is a 62 y.o. male with PMH of DM, hemoptysis, OSA, HTN, HLD, CAD, afib off Eliquis due to hemoptysis presenting with slurred speech, right sided weakness. Was intubated for hemoptysis prevention while giving tPA. Symptoms worsened and imaging confirmed BA occlusion. S/p IR with revascularization of BA as well as left VA s/p stenting.  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   MRI head - large acute/early subacute infarction within the central pons involving both sides, greater on the right.  CTA H&N - severe vascular athero including BA stenosis, b/l VA stenosis, right ICA proximal 90% stenosis, b/l cavernous ICA stenosis R>L.  Repeat CTA H/N - new distal BA occlusion  DSA - revascularization of the distal basilar artery to 90 % patency , and of 30 to 40 % of the LT VBJ with stent placement.  CT head 11/24/17 showed normal evolution of brainstem infarct, no acute change  2D Echo - EF 55-60%  LDL - 23  HgbA1c - 9.1  VTE prophylaxis - heparin subq  Eliquis (recently stopped) and plavix prior to admission, now  on IV heparin drip.     Ongoing aggressive stroke risk factor management  Therapy recommendations:  pending  Disposition:  Pending  Palliative care on board  METASTATIC PAPILLARY THYROID CARCINOMA  CTA  neck - right large thyroid mass compressing on trachea.   Thyroid mass larger than CT chest in 08/2016  Evidence of tracheal erosion on bronch at this admission  CT neck with contrast showed thyroid carcinoma, encasement of trachea.    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   oncology Dr Learta Codding on board  Respiratory failure  CCM on board  Still on vent  Wean off sedation as able  S/p Trach this am   PAF  Currently in afib RVR with respiratory distress  On eliquis prior but off due to hemoptysis  On heparin IV now  Continue brilinta due to [place] stent  Coreg bid for rate control  Cerebral vascular athero  CTA  head and neck showed right ICA proximal 90% stenosis, left ICA proximal athero, b/l cavernous ICA severe athro R>L, BA severe stenosis, b/l V4 severe athero with right V4 occlusion  S/p left V4 stent and BA revascularization  On brilinta and heparin IV  Hemoptysis, improved  Since 08/2016 as per family  Not sure about etiology  TB ruled out in the past  Likely tracheal erosion from right thyroid mass   CT neck showed thyroid carcinoma, encasement of trachea.   CCM on board  Consider tertiary center transfer for mass debulking or tracheal resection  Sepsis  101.8->102.2->101.6->101.5  Leukocytosis WBC 10.7->12.6->15.9->15.5  Hypotension - on levophed  On cefepime -> zosyn and vanco now  CXR - bibasilar atelectasis, CHF  CHF  Off lasix   CXR showed persistent CHF  Off coreg due to hypotension  Gentle hydration  Hypotension  With maxed on levophed  Coreg and lasix discontinued . Long term BP goal normotensive.  Hyperlipidemia  Lipid lowering medication PTA: Crestor 10 mg daily  LDL 23, goal < 70  On Crestor  Continue statin at discharge  Diabetes  HgbA1c 9.1, goal < 7.0  Uncontrolled  continued on insulin drip  Continued hyperglycemia  CCM on board   CBG monitoring  Dysphagia   Put  on cortrak  Resume TF after  Other Stroke Risk Factors  Advanced age  Former cigarette smoker - quit, 40 pack year history  Morbid obesity, Body mass index is 46.82 kg/m.    Hx stroke/TIA  Coronary artery disease   PAD with claudication  OSA on CPAP  Other Active Problems  Palliative care on board  Continue heparin drip  And  start warfarin   Discussed with Dr. Halford Chessman and Dr. Hulen Skains.This patient is critically ill and at significant risk of neurological worsening, death and care requires constant monitoring of vital signs, hemodynamics,respiratory and cardiac monitoring, extensive review of multiple databases, frequent neurological assessment, discussion with family, other specialists and medical decision making of high complexity. I spent 32 minutes of neurocritical care time  in the care of  this patient.   Antony Contras, MD Stroke Neurology 12/14/2017 5:02 PM    To contact Stroke Continuity provider, please refer to [email or web address] After hours, contact General Neurology

## 2017-12-14 NOTE — Progress Notes (Signed)
Nutrition Follow-up  DOCUMENTATION CODES:   Morbid obesity  INTERVENTION:   Continue:  Vital High Protein @ 65 ml/h (1560 ml per day)  30 ml Prostat TID  Provides:  1860 kcals, 181 gm protein, 1310 ml free water daily.  NUTRITION DIAGNOSIS:   Inadequate oral intake related to inability to eat as evidenced by NPO status; ongoing  GOAL:   Provide needs based on ASPEN/SCCM guidelines; met  MONITOR:   TF tolerance, Labs, I & O's  ASSESSMENT:   Pt with PMH of vascular dx, CAD, COPD, GERD, HLD, HTN, OSA, afib (off anticoagulation x 3 weeks due to hemoptysis), and DM admitted 6/27 with R MCA stroke s/p tPA and revascularization x 2 on 6/27. Pt noted to have a large thyroid mass and and multiple lung nodules suspicious for metastatic thyroid cancer. Also noted coolness of R distal leg, vascular has deferred interventions for now.   Pt discussed during ICU rounds and with RN.   7/18 trach 7/22 PEG  Patient remains intubated on ventilator support  Temp (24hrs), Avg:100.6 F (38.1 C), Min:98.4 F (36.9 C), Max:102 F (38.9 C)  Medications reviewed and include: colace, lasix, lantus Labs reviewed: CBG (last 3)  Recent Labs    12/14/17 0340 12/14/17 0805 12/14/17 1144  GLUCAP 203* 188* 189*   Pt is 9 L positive; weight up 14 lb from admission weight of 312 lb/141.6 kg UOP: 1400 ml   Diet Order:   Diet Order           Diet NPO time specified  Diet effective now          EDUCATION NEEDS:   No education needs have been identified at this time  Skin:  Skin Assessment: Reviewed RN Assessment  Last BM:  7/22  Height:   Ht Readings from Last 1 Encounters:  11/18/17 _0  (1.778 m)    Weight:   Wt Readings from Last 1 Encounters:  12/14/17 (!) 326 lb 4.5 oz (148 kg)    Ideal Body Weight:  75.4 kg  BMI:  Body mass index is 46.82 kg/m.  Estimated Nutritional Needs:   Kcal:  3794-3276  Protein:  151-188 grams  Fluid:  2 L/day  Maylon Peppers RD,  LDN, CNSC [phone] Pager [phone] After Hours Pager

## 2017-12-14 NOTE — Progress Notes (Signed)
Patient tolerated approximately 4.5 hours on CPAP/PS. The patient was switched back over to full support due to increased work of breathing and HR. Will continue to monitor.

## 2017-12-14 NOTE — Progress Notes (Signed)
  Speech Language Pathology Treatment: Cognitive-Linquistic  Patient Details Name: [name] [name] MRN: [id number] DOB: 12/15/1955 Today's Date: 12/14/2017 Time: 2897-9150 SLP Time Calculation (min) (ACUTE ONLY): 20 min  Assessment / Plan / Recommendation Clinical Impression  Pt on pressure support mode for approximately 3 hours when SLP initiated session. He was alert and followed command to "look at yes/no" on command accurately and consistently moving to using board to respond to biographical and environmental questions with 100%. He demonstrated vertical then more purposeful lateral head movements. His work of breathing increased with significant abdominal respirations; RN notified and RT placed back on full support; attempted to resume session and pt intermittently closing eyes and questioned if this was purposeful versus sleepiness. Ended session when pt unable to maintain alert state and RN reported she had increased pain meds during full vent placement due to tachycardia.  SLP notified that pt's girlfriend wanted update therefore met with her and provided results of session, answered questions and continued education. Answered specific questions re: pt's inability to control secretions, current lack of facial/lingual movement (she states pt protruded tongue slightly) and not appropriate for po's- she voiced understanding.    HPI HPI: [name] [name]  is a 62 year old man with atrial fibrillation, formally on anticoagulation was stopped due to recent hemoptysis.  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  He presented with an acute stroke 6/27, was intubated and potential tracheal erosion site covered with cuff balloon to facilitate TPA.  Unfortunately during the hospitalization is also experienced a second stroke and underwent mechanical thrombectomy, stent placement 7/1. MRI shows Acute/early subacute infarction within the central pons involving both  sides, greater on the right, measuring up to 2.6 cm, 3.6 cc. Additional punctate acute/early subacute infarctions within left lateral cerebellum and right parietal cortex. 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 Orally intubated at the time of initial assessment.       SLP Plan  Continue with current plan of care       Recommendations                   Oral Care Recommendations: Oral care QID Follow up Recommendations: LTACH SLP Visit Diagnosis: Other (comment)(augmentative communication) Plan: Continue with current plan of care       GO                [name] [name] 12/14/2017, 3:55 PM  [name] [name] [name] [name].[employer] [phone]

## 2017-12-14 NOTE — Progress Notes (Signed)
Pharmacy Antibiotic Note  [name] [name] is a 62 y.o. male admitted on 11/18/2017 with pneumonia.  Pharmacy has been consulted for Ceftazidime dosing.  Plan: Initiate Ceftazidime 1 gram IV q8hr  Height: 5\' 10"  (177.8 cm) Weight: (!) 326 lb 4.5 oz (148 kg) IBW/kg (Calculated) : 73  Temp (24hrs), Avg:100.2 F (37.9 C), Min:98.4 F (36.9 C), Max:101.4 F (38.6 C)  Recent Labs  Lab 12/10/17 0500 12/11/17 0429 12/12/17 0544 12/13/17 0515 12/13/17 1219 12/14/17 0433  WBC 15.5* 7.6 6.4 6.6  --  7.6  CREATININE 0.46* 0.45* 0.48* 0.49*  --  0.49*  VANCOTROUGH  --   --   --   --  7*  --     Estimated Creatinine Clearance: 141.3 mL/min (A) (by C-G formula based on SCr of 0.49 mg/dL (L)).    No Known Allergies  Antimicrobials this admission: Vanc 7/19 >>  Zosyn 7/19 >> 7/23 Ceftaz 7/23 >>  Microbiology results: 7/15 TA: MRSA and Providencia rettgeri 7/18 TA: MRSA  Thank you for allowing pharmacy to be a part of this patient's care.  Corinda Gubler, PharmD, [facility] 12/14/2017 8:03 AM

## 2017-12-14 NOTE — Progress Notes (Signed)
ANTICOAGULATION CONSULT NOTE  Pharmacy Consult for heparin Indication: atrial fibrillation and stroke  No Known Allergies  Patient Measurements: Height: 5\' 10"  (177.8 cm) Weight: (!) 326 lb 4.5 oz (148 kg) IBW/kg (Calculated) : 73  Vital Signs: Temp: 100.3 F (37.9 C) (07/23 0400) Temp Source: Oral (07/23 0400) BP: 178/87 (07/23 0500) Pulse Rate: 102 (07/23 0500)  Labs: Recent Labs    12/12/17 0544 12/12/17 1956 12/13/17 0515 12/14/17 0433  HGB 8.8*  --  9.5* 10.1*  HCT 29.0*  --  31.1* 33.8*  PLT 242  --  261 272  HEPARINUNFRC 0.24* 0.19*  --  0.23*  CREATININE 0.48*  --  0.49* 0.49*    Estimated Creatinine Clearance: 141.3 mL/min (A) (by C-G formula based on SCr of 0.49 mg/dL (L)).  Assessment: 62 y.o. male with h/o Afib and new CVA, now s/p PEG placement, for heparin    Goal of Therapy:  Heparin level 0.3 - 0.5 units/ml Monitor platelets by anticoagulation protocol: Yes   Plan:  Increase Heparin 2700 units/hr Check heparin level in 8 hours.   Phillis Knack, PharmD, BCPS  12/14/2017 6:05 AM

## 2017-12-15 ENCOUNTER — Inpatient Hospital Stay
Admission: AD | Admit: 2017-12-15 | Discharge: 2018-01-23 | Disposition: E | Payer: Managed Care, Other (non HMO) | Source: Ambulatory Visit | Attending: Internal Medicine | Admitting: Internal Medicine

## 2017-12-15 ENCOUNTER — Other Ambulatory Visit (HOSPITAL_COMMUNITY): Payer: Self-pay

## 2017-12-15 DIAGNOSIS — Z930 Tracheostomy status: Secondary | ICD-10-CM

## 2017-12-15 DIAGNOSIS — I480 Paroxysmal atrial fibrillation: Secondary | ICD-10-CM | POA: Diagnosis present

## 2017-12-15 DIAGNOSIS — C73 Malignant neoplasm of thyroid gland: Secondary | ICD-10-CM

## 2017-12-15 DIAGNOSIS — R5081 Fever presenting with conditions classified elsewhere: Secondary | ICD-10-CM

## 2017-12-15 DIAGNOSIS — I672 Cerebral atherosclerosis: Secondary | ICD-10-CM

## 2017-12-15 DIAGNOSIS — R509 Fever, unspecified: Secondary | ICD-10-CM

## 2017-12-15 DIAGNOSIS — A419 Sepsis, unspecified organism: Secondary | ICD-10-CM

## 2017-12-15 DIAGNOSIS — I959 Hypotension, unspecified: Secondary | ICD-10-CM

## 2017-12-15 DIAGNOSIS — J189 Pneumonia, unspecified organism: Secondary | ICD-10-CM | POA: Diagnosis present

## 2017-12-15 DIAGNOSIS — I4891 Unspecified atrial fibrillation: Secondary | ICD-10-CM

## 2017-12-15 DIAGNOSIS — D638 Anemia in other chronic diseases classified elsewhere: Secondary | ICD-10-CM

## 2017-12-15 DIAGNOSIS — Z931 Gastrostomy status: Secondary | ICD-10-CM

## 2017-12-15 DIAGNOSIS — R6520 Severe sepsis without septic shock: Secondary | ICD-10-CM

## 2017-12-15 DIAGNOSIS — J9690 Respiratory failure, unspecified, unspecified whether with hypoxia or hypercapnia: Secondary | ICD-10-CM

## 2017-12-15 DIAGNOSIS — I509 Heart failure, unspecified: Secondary | ICD-10-CM

## 2017-12-15 DIAGNOSIS — J9600 Acute respiratory failure, unspecified whether with hypoxia or hypercapnia: Secondary | ICD-10-CM

## 2017-12-15 DIAGNOSIS — J15212 Pneumonia due to Methicillin resistant Staphylococcus aureus: Secondary | ICD-10-CM

## 2017-12-15 DIAGNOSIS — I651 Occlusion and stenosis of basilar artery: Secondary | ICD-10-CM | POA: Diagnosis present

## 2017-12-15 DIAGNOSIS — E876 Hypokalemia: Secondary | ICD-10-CM

## 2017-12-15 DIAGNOSIS — G4733 Obstructive sleep apnea (adult) (pediatric): Secondary | ICD-10-CM | POA: Diagnosis present

## 2017-12-15 DIAGNOSIS — J9621 Acute and chronic respiratory failure with hypoxia: Secondary | ICD-10-CM

## 2017-12-15 DIAGNOSIS — J90 Pleural effusion, not elsewhere classified: Secondary | ICD-10-CM

## 2017-12-15 LAB — CBC
HCT: 35.4 % — ABNORMAL LOW (ref 39.0–52.0)
HEMATOCRIT: 31.1 % — AB (ref 39.0–52.0)
Hemoglobin: 9.2 g/dL — ABNORMAL LOW (ref 13.0–17.0)
Hemoglobin: 10.5 g/dL — ABNORMAL LOW (ref 13.0–17.0)
MCH: 27.1 pg (ref 26.0–34.0)
MCH: 27.1 pg (ref 26.0–34.0)
MCHC: 29.6 g/dL — ABNORMAL LOW (ref 30.0–36.0)
MCHC: 29.7 g/dL — ABNORMAL LOW (ref 30.0–36.0)
MCV: 91.5 fL (ref 78.0–100.0)
MCV: 91.5 fL (ref 78.0–100.0)
PLATELETS: 239 10*3/uL (ref 150–400)
PLATELETS: 286 10*3/uL (ref 150–400)
RBC: 3.4 MIL/uL — ABNORMAL LOW (ref 4.22–5.81)
RBC: 3.87 MIL/uL — ABNORMAL LOW (ref 4.22–5.81)
RDW: 14.5 % (ref 11.5–15.5)
RDW: 15.2 % (ref 11.5–15.5)
WBC: 6.2 10*3/uL (ref 4.0–10.5)
WBC: 10.1 10*3/uL (ref 4.0–10.5)

## 2017-12-15 LAB — GLUCOSE, CAPILLARY
GLUCOSE-CAPILLARY: 210 mg/dL — AB (ref 70–99)
GLUCOSE-CAPILLARY: 211 mg/dL — AB (ref 70–99)
GLUCOSE-CAPILLARY: 243 mg/dL — AB (ref 70–99)
Glucose-Capillary: 203 mg/dL — ABNORMAL HIGH (ref 70–99)

## 2017-12-15 LAB — PROTIME-INR
INR: 1.3
INR: 1.2
Prothrombin Time: 16.1 seconds — ABNORMAL HIGH (ref 11.4–15.2)
Prothrombin Time: 15.1 seconds (ref 11.4–15.2)

## 2017-12-15 LAB — HEPARIN LEVEL (UNFRACTIONATED)
HEPARIN UNFRACTIONATED: 0.92 [IU]/mL — AB (ref 0.30–0.70)
Heparin Unfractionated: 0.25 IU/mL — ABNORMAL LOW (ref 0.30–0.70)

## 2017-12-15 LAB — VANCOMYCIN, TROUGH: VANCOMYCIN TR: 8 ug/mL — AB (ref 15–20)

## 2017-12-15 LAB — APTT: APTT: 22 s — AB (ref 24–36)

## 2017-12-15 MED ORDER — ACETAMINOPHEN 650 MG RE SUPP: 650.0000 mg | RECTAL | 0 refills | Status: AC | PRN

## 2017-12-15 MED ORDER — MIDAZOLAM HCL 2 MG/2ML IJ SOLN: 2.0000 mg | INTRAMUSCULAR | 0 refills | Status: AC | PRN

## 2017-12-15 MED ORDER — DOCUSATE SODIUM 50 MG/5ML PO LIQD: 100.0000 mg | Freq: Two times a day (BID) | ORAL | 0 refills | Status: AC

## 2017-12-15 MED ORDER — WARFARIN SODIUM 7.5 MG PO TABS
7.5000 mg | ORAL_TABLET | Freq: Once | ORAL | Status: DC
  Filled 2017-12-15: qty 1

## 2017-12-15 MED ORDER — PANTOPRAZOLE SODIUM 40 MG PO PACK: 40.0000 mg | PACK | Freq: Every day | ORAL | Status: AC

## 2017-12-15 MED ORDER — CHLORHEXIDINE GLUCONATE 0.12% ORAL RINSE (MEDLINE KIT): 15.0000 mL | Freq: Two times a day (BID) | OROMUCOSAL | 0 refills | Status: AC

## 2017-12-15 MED ORDER — CHLORHEXIDINE GLUCONATE CLOTH 2 % EX PADS: 6.0000 | MEDICATED_PAD | Freq: Every day | CUTANEOUS | Status: AC

## 2017-12-15 MED ORDER — METOPROLOL TARTRATE 25 MG/10 ML ORAL SUSPENSION: 50.0000 mg | Freq: Two times a day (BID) | ORAL | Status: AC

## 2017-12-15 MED ORDER — ROSUVASTATIN CALCIUM 20 MG PO TABS: 20.0000 mg | ORAL_TABLET | Freq: Every day | ORAL | Status: AC

## 2017-12-15 MED ORDER — FENTANYL 50 MCG/HR TD PT72
100.0000 ug | MEDICATED_PATCH | TRANSDERMAL | Status: DC
  Administered 2017-12-15: 100 ug via TRANSDERMAL
  Filled 2017-12-15: qty 2

## 2017-12-15 MED ORDER — ACETAMINOPHEN 160 MG/5ML PO SOLN: 650.0000 mg | ORAL | 0 refills | Status: AC | PRN

## 2017-12-15 MED ORDER — MAGNESIUM SULFATE 2 GM/50ML IV SOLN
2.0000 g | Freq: Once | INTRAVENOUS | Status: AC
  Administered 2017-12-15: 2 g via INTRAVENOUS
  Filled 2017-12-15: qty 50

## 2017-12-15 MED ORDER — WARFARIN SODIUM 7.5 MG PO TABS: 7.5000 mg | ORAL_TABLET | Freq: Once | ORAL | Status: AC

## 2017-12-15 MED ORDER — IOPAMIDOL (ISOVUE-300) INJECTION 61%
INTRAVENOUS | Status: AC
  Filled 2017-12-15: qty 50

## 2017-12-15 MED ORDER — ORAL CARE MOUTH RINSE: 15.0000 mL | Freq: Every day | OROMUCOSAL | 0 refills | Status: AC

## 2017-12-15 MED ORDER — TICAGRELOR 90 MG PO TABS: 90.0000 mg | ORAL_TABLET | Freq: Two times a day (BID) | ORAL | Status: AC

## 2017-12-15 MED ORDER — SODIUM CHLORIDE 0.9% FLUSH: 10.0000 mL | INTRAVENOUS | Status: AC | PRN

## 2017-12-15 MED ORDER — INSULIN GLARGINE 100 UNIT/ML ~~LOC~~ SOLN: 8.0000 [IU] | Freq: Every day | SUBCUTANEOUS | 11 refills | Status: AC

## 2017-12-15 MED ORDER — SODIUM CHLORIDE 0.9% FLUSH
10.0000 mL | Freq: Two times a day (BID) | INTRAVENOUS | Status: AC
Start: 2017-12-15 — End: ?

## 2017-12-15 MED ORDER — BISACODYL 10 MG RE SUPP: 10.0000 mg | Freq: Every day | RECTAL | 0 refills | Status: AC | PRN

## 2017-12-15 MED ORDER — IPRATROPIUM-ALBUTEROL 0.5-2.5 (3) MG/3ML IN SOLN: 3.0000 mL | RESPIRATORY_TRACT | Status: AC | PRN

## 2017-12-15 MED ORDER — HEPARIN (PORCINE) IN NACL 100-0.45 UNIT/ML-% IJ SOLN: 2850.0000 [IU]/h | INTRAMUSCULAR | Status: AC

## 2017-12-15 MED ORDER — FENTANYL CITRATE (PF) 100 MCG/2ML IJ SOLN: 25.0000 ug | INTRAMUSCULAR | 0 refills | Status: AC | PRN

## 2017-12-15 MED ORDER — POTASSIUM CHLORIDE 20 MEQ/15ML (10%) PO SOLN: 40.0000 meq | Freq: Three times a day (TID) | ORAL | 0 refills | Status: AC

## 2017-12-15 MED ORDER — FENTANYL 100 MCG/HR TD PT72: 100.0000 ug | MEDICATED_PATCH | TRANSDERMAL | 0 refills | Status: AC

## 2017-12-15 MED ORDER — VANCOMYCIN HCL IN DEXTROSE 1-5 GM/200ML-% IV SOLN
1000.0000 mg | Freq: Three times a day (TID) | INTRAVENOUS | Status: DC
  Filled 2017-12-15: qty 200

## 2017-12-15 MED ORDER — SODIUM CHLORIDE 0.9 % IV SOLN: 1.0000 g | Freq: Three times a day (TID) | INTRAVENOUS | Status: AC

## 2017-12-15 MED ORDER — VANCOMYCIN HCL IN DEXTROSE 1-5 GM/200ML-% IV SOLN: 1000.0000 mg | Freq: Three times a day (TID) | INTRAVENOUS | Status: AC

## 2017-12-15 MED ORDER — INSULIN ASPART 100 UNIT/ML ~~LOC~~ SOLN: 0.0000 [IU] | SUBCUTANEOUS | 11 refills | Status: AC

## 2017-12-15 MED ORDER — VITAL HIGH PROTEIN PO LIQD: 1000.0000 mL | ORAL | Status: AC

## 2017-12-15 MED ORDER — WARFARIN SODIUM 5 MG PO TABS: 5.0000 mg | ORAL_TABLET | Freq: Every day | ORAL | 11 refills | Status: AC

## 2017-12-15 MED ORDER — POTASSIUM CHLORIDE 20 MEQ/15ML (10%) PO SOLN
40.0000 meq | Freq: Three times a day (TID) | ORAL | Status: AC
  Administered 2017-12-15 (×2): 40 meq
  Filled 2017-12-15 (×2): qty 30

## 2017-12-15 MED ORDER — METOPROLOL TARTRATE 25 MG/10 ML ORAL SUSPENSION: 50.0000 mg | Freq: Two times a day (BID) | ORAL | Status: DC

## 2017-12-15 MED ORDER — PRO-STAT SUGAR FREE PO LIQD
30.0000 mL | Freq: Three times a day (TID) | ORAL | 0 refills | Status: AC
Start: 2017-12-15 — End: ?

## 2017-12-15 MED ORDER — SODIUM CHLORIDE 0.9 % IV SOLN: 10.0000 mL | INTRAVENOUS | 0 refills | Status: AC

## 2017-12-15 MED ORDER — METOPROLOL TARTRATE 5 MG/5ML IV SOLN: 5.0000 mg | INTRAVENOUS | Status: AC | PRN

## 2017-12-15 MED ORDER — SODIUM CHLORIDE 0.9 % IV SOLN: 10.0000 mL | INTRAVENOUS | 0 refills | Status: AC | PRN

## 2017-12-15 NOTE — Progress Notes (Signed)
[facility] for heparin/Coumadin Indication: atrial fibrillation and stroke  No Known Allergies  Patient Measurements: Height: 5\' 10"  (177.8 cm) Weight: (!) 326 lb 4.5 oz (148 kg) IBW/kg (Calculated) : 73  Vital Signs: Temp: 100.6 F (38.1 C) (07/24 0400) Temp Source: Axillary (07/24 0400) BP: 127/71 (07/24 0600) Pulse Rate: 89 (07/24 0600)  Labs: Recent Labs    12/13/17 0515 12/14/17 0433 12/14/17 1400 12/14/17 1858 11/29/2017 0627 12/14/2017 0628  HGB 9.5* 10.1*  --   --  9.2*  --   HCT 31.1* 33.8*  --   --  31.1*  --   PLT 261 272  --   --  239  --   LABPROT  --   --   --  15.9* 16.1*  --   INR  --   --   --  1.28 1.30  --   HEPARINUNFRC  --  0.23* 0.44  --   --  0.25*  CREATININE 0.49* 0.49*  --   --   --   --     Estimated Creatinine Clearance: 141.3 mL/min (A) (by C-G formula based on SCr of 0.49 mg/dL (L)).  Assessment: 62 y.o. male with h/o Afib and new CVA, s/p PEG placement 7/22, for anticoagulation   Goal of Therapy:  Heparin level 0.3 - 0.5 units/ml Monitor platelets by anticoagulation protocol: Yes   Plan:  Increase Heparin 2850 units/hr Coumadin 7.5 mg today  Phillis Knack, PharmD, BCPS  12/07/2017 7:00 AM

## 2017-12-15 NOTE — Discharge Summary (Addendum)
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 Active Problems:   Hemoptysis   OSA (obstructive sleep apnea)   Morbid obesity due to excess calories (HCC)   Lung nodules   Essential hypertension   CAD (coronary artery disease)   Atrial fibrillation with RVR ([place])   Diabetes mellitus due to underlying condition with unspecified complications ([place])   History of smoking   Hyperlipidemia LDL goal <70   Claudication in peripheral vascular disease ([place])   Acute respiratory failure ([place])   Thyroid mass   Goals of care, counseling/discussion   Palliative care by specialist   Fever   Hyperglycemia   Ventilator dependence ([place])   Leukocytosis   Tracheostomy care ([place])   Primary thyroid papillary adenocarcinoma ([place])   HCAP (healthcare-associated pneumonia)   MRSA pneumonia ([place])   Cerebral atherosclerosis   Sepsis ([place])   CHF (congestive heart failure) ([place])   Hypotension, resolved   Hypokalemia   Anemia of chronic disease   Past Medical History:  Diagnosis Date  . Collagen vascular disease ([place])   . Coronary artery disease   . GERD (gastroesophageal reflux disease)   . High cholesterol   . Hypertension   . OSA (obstructive sleep apnea)    "dx'd years ago; never have worn mask; mask never ordered" (04/01/2017)  . Pneumonia 2011  . Type II diabetes mellitus ([place])  07/2016   Past Surgical History:  Procedure Laterality Date  . CORONARY ANGIOPLASTY WITH STENT PLACEMENT  2011; 07/2016   s/p PCI  . IR ANGIO EXTRACRAN SEL COM CAROTID INNOMINATE UNI BILAT MOD SED  11/18/2017  . IR ANGIO VERTEBRAL SEL SUBCLAVIAN INNOMINATE UNI R MOD SED  11/18/2017  . IR INTRA CRAN STENT  11/18/2017  . IR US GUIDE BX ASP/DRAIN  11/29/2017  . LOWER EXTREMITY ANGIOGRAPHY Bilateral 04/01/2017   Procedure: Lower Extremity Angiography;  Surgeon: Lorretta Harp, MD;  Location: [facility];  Service: Cardiovascular;  Laterality: Bilateral;  . PERIPHERAL VASCULAR INTERVENTION Left 04/01/2017   Procedure: PERIPHERAL VASCULAR INTERVENTION;  Surgeon: Lorretta Harp, MD;  Location: [facility];  Service: Cardiovascular;  Laterality: Left;  common iliac  . RADIOLOGY WITH ANESTHESIA N/A 11/18/2017   Procedure: RADIOLOGY WITH ANESTHESIA;  Surgeon: Luanne Bras, MD;  Location: [facility];  Service: Radiology;  Laterality: N/A;  . TRACHEOSTOMY TUBE PLACEMENT N/A 12/09/2017   Procedure: TRACHEOSTOMY;  Surgeon: Melida Quitter, MD;  Location: [facility];  Service: ENT;  Laterality: N/A;  . VIDEO BRONCHOSCOPY Bilateral 08/25/2016   Procedure: VIDEO BRONCHOSCOPY WITHOUT FLUORO;  Surgeon: Collene Gobble, MD;  Location: [facility];  Service: Cardiopulmonary;  Laterality: Bilateral;    Allergies as of 12/09/2017   No Known Allergies     Medication List    STOP taking these medications   apixaban 5 MG Tabs tablet Commonly known as:  ELIQUIS   clopidogrel 75 MG tablet Commonly known as:  PLAVIX   dronedarone 400 MG tablet Commonly known as:  MULTAQ   famotidine 20 MG tablet Commonly known as:  PEPCID   fenofibrate 160 MG tablet   Fish Oil 1000 MG Caps   irbesartan 75 MG tablet Commonly known as:  AVAPRO   liraglutide 18 MG/3ML Sopn Commonly known as:  VICTOZA   metFORMIN 1000 MG tablet Commonly known as:  GLUCOPHAGE   metoprolol tartrate 50 MG tablet Commonly known  as:  LOPRESSOR Replaced by:  metoprolol tartrate 5 MG/5ML Soln injection   nitroGLYCERIN 0.4 MG SL tablet Commonly known as:  NITROSTAT   pantoprazole 40 MG tablet Commonly known as:  PROTONIX Replaced by:  pantoprazole sodium 40 mg/20 mL Pack   VITAMIN B COMPLEX PO     TAKE these medications   acetaminophen 160 MG/5ML solution Commonly known as:  TYLENOL Place 20.3 mLs (650 mg total) into feeding tube every 4 (four) hours as needed for mild pain (or temp > 37.5 C (99.5 F)).   acetaminophen 650 MG suppository Commonly known as:  TYLENOL Place 1 suppository (650 mg total) rectally every 4 (four) hours as needed for mild pain (or temp > 37.5 C (99.5 F)).   bisacodyl 10 MG suppository Commonly known as:  DULCOLAX Place 1 suppository (10 mg total) rectally daily as needed for mild constipation or moderate constipation.   cefTAZidime 1 g in sodium chloride 0.9 % 100 mL Inject 1 g into the vein every 8 (eight) hours.   chlorhexidine gluconate (MEDLINE KIT) 0.12 % solution Commonly known as:  PERIDEX 15 mLs by Mouth Rinse route 2 (two) times daily.   Chlorhexidine Gluconate Cloth 2 % Pads Apply 6 each topically daily at 6 (six) AM. Start taking on:  12/16/2017   docusate 50 MG/5ML liquid Commonly known as:  COLACE Place 10 mLs (100 mg total) into feeding tube 2 (two) times daily.   feeding supplement (PRO-STAT SUGAR FREE 64) Liqd Place 30 mLs into feeding tube 3 (three) times daily.   feeding supplement (VITAL HIGH PROTEIN) Liqd liquid Place 1,000 mLs into feeding tube continuous.   fentaNYL 100 MCG/2ML injection Commonly known as:  SUBLIMAZE Inject 0.5-1.5 mLs (25-75 mcg total) into the vein every 2 (two) hours as needed for severe pain (or RASS goal -1).   fentaNYL 100 MCG/HR Commonly known as:  DURAGESIC - dosed mcg/hr Place 1 patch (100 mcg total) onto the skin every 3 (three) days. Start taking on:  12/18/2017   heparin 100-0.45 UNIT/ML-% infusion Inject 2,850  Units/hr into the vein continuous.   insulin aspart 100 UNIT/ML injection Commonly known as:  novoLOG Inject 0-15 Units into the skin every 4 (four) hours.   insulin glargine 100 UNIT/ML injection Commonly known as:  LANTUS Inject 0.08 mLs (8 Units total) into the skin at bedtime.   ipratropium-albuterol 0.5-2.5 (3) MG/3ML Soln Commonly known as:  DUONEB Take 3 mLs by nebulization every 4 (four) hours as needed.   metoprolol tartrate 5 MG/5ML Soln injection Commonly known as:  LOPRESSOR Inject 5 mLs (5 mg total) into the vein every 3 (three) hours as needed (HR > 115). Replaces:  metoprolol tartrate 50 MG tablet   metoprolol tartrate 25 mg/10 mL Susp Commonly known as:  LOPRESSOR Place 20 mLs (50 mg total) into feeding tube every 12 (twelve) hours.   midazolam 2 MG/2ML Soln injection Commonly known as:  VERSED Inject 2 mLs (2 mg total) into the vein every 2 (two) hours as needed for agitation (to  maintain RASS goal.).   mouth rinse Liqd solution 15 mLs by Mouth Rinse route daily.   pantoprazole sodium 40 mg/20 mL Pack Commonly known as:  PROTONIX Place 20 mLs (40 mg total) into feeding tube daily. Start taking on:  12/16/2017 Replaces:  pantoprazole 40 MG tablet   potassium chloride 20 MEQ/15ML (10%) Soln Place 30 mLs (40 mEq total) into feeding tube 3 (three) times daily.   rosuvastatin 20 MG tablet Commonly known as:  CRESTOR Place 1 tablet (20 mg total) into feeding tube daily at 6 PM. What changed:    medication strength  how much to take  how to take this  when to take this   sodium chloride 0.9 % infusion Inject 10 mLs into the vein as needed (For administration of IV medications).   sodium chloride 0.9 % infusion Inject 10 mLs into the vein continuous.   sodium chloride flush 0.9 % Soln Commonly known as:  NS 10-40 mLs by Intracatheter route every 12 (twelve) hours.   sodium chloride flush 0.9 % Soln Commonly known as:  NS 10-40 mLs by  Intracatheter route as needed (flush).   ticagrelor 90 MG Tabs tablet Commonly known as:  BRILINTA Take 1 tablet (90 mg total) by mouth 2 (two) times daily.   vancomycin 1-5 GM/200ML-% Soln Commonly known as:  VANCOCIN Inject 200 mLs (1,000 mg total) into the vein every 8 (eight) hours.   warfarin 7.5 MG tablet Commonly known as:  COUMADIN Take 1 tablet (7.5 mg total) by mouth one time only at 6 PM.   warfarin 5 MG tablet Commonly known as:  COUMADIN Take 1 tablet (5 mg total) by mouth daily. Adjust dose for goal INR 2-3       LABORATORY STUDIES CBC    Component Value Date/Time   WBC 6.2 12/16/2017 0627   RBC 3.40 (L) 11/24/2017 0627   HGB 9.2 (L) 12/14/2017 0627   HGB 12.8 (L) 08/09/2017 1000   HCT 31.1 (L) 12/17/2017 0627   HCT 38.4 08/09/2017 1000   PLT 239 12/21/2017 0627   PLT 276 08/09/2017 1000   MCV 91.5 11/23/2017 0627   MCV 87 08/09/2017 1000   MCH 27.1 12/05/2017 0627   MCHC 29.6 (L) 11/26/2017 0627   RDW 14.5 12/01/2017 0627   RDW 14.7 08/09/2017 1000   LYMPHSABS 1.3 12/13/2017 0515   LYMPHSABS 1.3 08/09/2017 1000   MONOABS 0.5 12/13/2017 0515   EOSABS 0.2 12/13/2017 0515   EOSABS 0.7 (H) 08/09/2017 1000   BASOSABS 0.0 12/13/2017 0515   BASOSABS 0.0 08/09/2017 1000   CMP    Component Value Date/Time   NA 144 12/14/2017 0433   NA 138 08/09/2017 1000   K 3.5 12/14/2017 0433   CL 112 (H) 12/14/2017 0433   CO2 22 12/14/2017 0433   GLUCOSE 215 (H) 12/14/2017 0433   BUN 18 12/14/2017 0433   BUN 9 08/09/2017 1000   CREATININE 0.49 (L) 12/14/2017 0433   CALCIUM 9.0 12/14/2017 0433   PROT 5.5 (L) 11/25/2017 0403   PROT 7.1 08/09/2017 1000   ALBUMIN 2.1 (L) 12/10/2017 0500   ALBUMIN 3.9 08/09/2017 1000   AST 14 (L) 11/25/2017 0403   ALT 15 11/25/2017 0403   ALKPHOS 43 11/25/2017 0403   BILITOT 0.6 11/25/2017 0403   BILITOT 0.7 08/09/2017 1000   GFRNONAA >60 12/14/2017 0433   GFRAA >60 12/14/2017 0433   COAGS Lab Results  Component Value Date    INR 1.30 12/02/2017   INR  1.28 12/14/2017   INR 1.20 11/24/2017   Lipid Panel    Component Value Date/Time   CHOL 110 11/19/2017 0437   CHOL 119 08/09/2017 1000   TRIG 340 (H) 11/19/2017 0437   HDL 19 (L) 11/19/2017 0437   HDL 29 (L) 08/09/2017 1000   CHOLHDL 5.8 11/19/2017 0437   VLDL 68 (H) 11/19/2017 0437   LDLCALC 23 11/19/2017 0437   LDLCALC 30 08/09/2017 1000   HgbA1C  Lab Results  Component Value Date   HGBA1C 9.1 (H) 11/19/2017   Urinalysis No results found for: COLORURINE, APPEARANCEUR, LABSPEC, PHURINE, GLUCOSEU, HGBUR, BILIRUBINUR, KETONESUR, PROTEINUR, UROBILINOGEN, NITRITE, LEUKOCYTESUR Urine Drug Screen No results found for: LABOPIA, COCAINSCRNUR, LABBENZ, AMPHETMU, THCU, LABBARB  Alcohol Level No results found for: [place] Code Stroke Wo Contrast 11/18/2017 1. No acute intracranial abnormality.  Atherosclerotic calcification  2. ASPECTS is 10   Ct Angio Head W Or Wo Contrast Ct Angio Neck W Or Wo Contrast 11/18/2017 1. Severe intracranial and extracranial atherosclerotic disease. No emergent large vessel occlusion  2. Severe stenosis right carotid bifurcation due to calcified plaque, estimated 90% diameter stenosis. No significant left carotid stenosis. Severe stenosis cavernous carotid bilaterally.  3. Both vertebral arteries are patent in the neck with severe calcific stenosis V4 segment bilaterally.  4. 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 Small calcified lymph nodes left neck suspicious for metastatic disease  5. Small lung nodules on the right, possible metastatic disease. Recommend chest CT for further evaluation.   Ct Chest W Contrast 11/18/2017 Right upper lobe airspace opacity is noted with air bronchograms concerning for pneumonia or possibly atelectasis. Stable mediastinal adenopathy is noted compared to prior exam. Endotracheal and nasogastric tubes are in grossly good position.  Coronary artery calcifications are noted suggesting coronary artery disease. Possible 2.8 cm left thyroid nodule. Thyroid ultrasound is recommended for further evaluation. Aortic Atherosclerosis (ICD10-I70.0).   Ct Angio Head W Or Wo Contrast  Ct Angio Neck W Or Wo Contrast 11/18/2017 Since the study of earlier today, there are only 2 changes. There is new consolidation and volume loss in the right upper lobe. 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 Flow is seen distal to that however, presumably indicating patent posterior communicating arteries.   Cerebral Angiogram / Stent  11/18/2017 1. 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 Occluded non dominant RT VBJ. 2. S/P endovascular revascularization of the distal basilar artery To 90 % patency , and of 30 to 40 % of the LT VBJ with stent placement. Dominant Lt Pcom.  Mr Brain Wo Contrast 11/19/2017 1. 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 No associated hemorrhage or significant mass effect.  2. Additional punctate acute/early subacute infarctions within left lateral cerebellum and right parietal cortex.  3. Minimal chronic microvascular ischemic changes of the brain and mild parenchymal volume loss.   Ct Head Wo Contrast 11/19/2017 1. Expected evolution of brainstem infarct without hemorrhage.  2. No new infarcts.  3. Left vertebral artery and basilar artery stent.  4. Acute on chronic sinus disease may be related to intubation.   Ct Head Wo Contrast 11/24/2017 1. Continued normal expected interval evolution of brainstem infarct. No evidence for hemorrhagic transformation or other complication.  2. No other new intracranial abnormality.  3. Moderate paranasal sinus  disease with bilateral mastoid effusions, likely related intubation.   Ct Soft Tissue Neck W  Contrast 11/26/2017 Aside from the fact that the patient shows endotracheal intubation and the presence of an orogastric tube, and aside from development of bilateral layering pleural effusions with dependent atelectasis, the findings are unchanged since the previous studies. Irregular mass arising from the inferior left thyroid lobe, measuring approximately 5 x 3.4 x 5.5 cm, with invasion of the surrounding tissues worrisome for thyroid carcinoma. Partial encasement of the trachea. Broad surface along the esophagus. Small but likely pathologic nodes in the lower neck and superior mediastinum, some with calcification, probably involved by metastatic disease.   Ct Head Wo Contrast 12/06/2017 Continued normal evolution of brainstem infarct. Otherwise, no acute intracranial pathology.  Transthoracic Echocardiogram - Left ventricle: The cavity size was moderately dilated. Wallthickness was increased in a pattern of severe LVH. Systolicfunction was normal. The estimated ejection fraction was in therange of 55% to 60%. Wall motion was normal; there were noregional wall motion abnormalities. The study is not technicallysufficient to allow evaluation of LV diastolic function. - Aortic valve: Trileaflet. Sclerosis without stenosis. There wasno regurgitation. Valve area (Vmax): 2.88 cm^2. - Aorta: Aortic root ML diameter: 41.95 mm (ED). - Aortic root: The aortic root is mildly dilated. - Mitral valve: Calcified annulus. There was trivial regurgitation. - Left atrium: The atrium was mildly dilated. - Right ventricle: The cavity size was mildly dilated. Mildlyreduced systolic function. - Inferior vena cava: The vessel was normal in size. Therespirophasic diameter changes were in the normal range (>= 50%),consistent with normal central venous pressure. Impressions:  Compared to a prior study in 02/2017, there have been nosignificant changes.  Dg Chest Port 1 View (last one) 12/14/2017 Diffuse  slightly asymmetric airspace disease has progressed slightly since the prior exam (right lower lobe). Small left-sided pleural effusion. Findings may reflect changes of pulmonary edema superimposed upon chronic changes although basilar infiltrates not excluded in the proper clinical setting. Aortic Atherosclerosis (ICD10-I70.0).  Lymph node, needle/core biopsy - METASTATIC PAPILLARY THYROID CARCINOMA.  VAS [place] ABE WITH/WO TBI 11/18/2017 Right:  Unable to obtain ABI due to low amplitude or nonexistent waveforms.  Left: Resting left ankle-brachial index indicates moderate left lower extremity arterial disease.     HISTORY OF PRESENT ILLNESS Declin Rajan an 62 y.o.malewith HTN, DM2, HLD, afib ( was on eliquis) hemoptysis who presented to [facility] ED as a code stroke with left side weakness and slurred speech.  Per patient and girlfriend he woke up this morning and he felt normal. He walked to the door at 0600 to wave bye bye to his girlfriend. About 830 he called his girlfriend and was having slurred speech. She told him to call EMS. Patient states that it was about 0800 11/18/2017 ([facility]) when he started " feeling funny". Patient has a history of coughing up blood in which he states he was removed from his eliquis3 weeks ago. He reports that his last episode of coughing up blood was a few days ago. Denies any CP, SOB  In the ED: BG: 381,BP: was 161/110 Head CT was obtained and no acute intracranial abnormality. ASPECTS 10. Due to patient having hemoptysis pulmonology was consulted who agreed to intubate and bronch the patient at bedside before TPA decision was made. No LVO seen, not an IR candidate.  No previous history of stroke found during chart review. 04/14/2017 patient had a f/u visit for peripheral angiography and at that time he was on Eliquis and Plavix. Modified  Rankin:Rankin Score=0  IV TPA was administered and he was admitted to the neuro ICU for further evaluation and  treatment.   HOSPITAL COURSE Mr. Lenvil Swaim is a 62 y.o. male with PMH of DM, hemoptysis, OSA, HTN, HLD, CAD, afib off Eliquis due to hemoptysis presenting with slurred speech, right sided weakness. Was intubated for hemoptysis prevention while giving tPA. post tPA, neuro symptoms worsened and imaging confirmed BA occlusion. taken to IR for partial revascularization of BA as well as left VA stenting.  MRI confirms severe brainstem infarct.  He is nearly locked in with positive vertical eye movements.  He was found to have thyroid cancer, which totally encircles his trachea.  Lymph node biopsy was positive for metastatic adenocarcinoma.  His girlfriend, who lives with him, is his legal healthcare power of attorney (discussed with and approved by children who are located across the [place]).  Even though intubated, he was assessed to have 100% accuracy understanding.  CCM, palliative care, oncology and neuro all met and talked with patient about his diagnosis and future treatment.  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  Understanding this, he elected to have a trach placed.  ENT was able to place the trach here at [facility], though it likely will not last given thyroid cancer.  PEG tube was also placed for feeding.  Weaning from ventilator is underway.  [facility]  Currently thyroid cancer treatment is on hold awaiting respiratory stability.   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   MRI head - large acute/early  subacute infarction within the central pons involving both sides, greater on the right.  CTA H&N - severe vascular athero including BA stenosis, b/l VA stenosis, right ICA proximal 90% stenosis, b/l cavernous ICA stenosis R>L.  Repeat CTA H/N - new distal BA occlusion  DSA - revascularization of the distal basilar artery to 90 % patency , and of 30 to 40 % of the LT VBJ with stent placement.  CT head 11/24/17 showed normal evolution of brainstem infarct, no acute change  CT head 12/06/17 continued evolution of brainstem infarct  2D Echo - EF 55-60%  LDL - 23  HgbA1c - 9.1  Eliquis (recently stopped) and plavix prior to admission, now  on IV heparin drip as bridge to warfarin. INR 1.3 and Brilinta given vertebral artery stent  Therapy recommendations:  LTAC  Disposition:  LTAC  METASTATIC PAPILLARY THYROID CARCINOMA  CTA neck - right large thyroid mass compressing on trachea.   Evidence of tracheal erosion on bronch at this admission  CT neck with contrast showed thyroid carcinoma, encasement of trachea.   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  oncology consulted - Dr Learta Codding   Not a candidate for treatment of advanced thyroid cancer at present. May be difficult to treat d/t possibility of trachea involvement.   Acute Respiratory Failure  Intubated, failure to wean s/p trach placement by ENT  CCM consulted  HCAP w/ MRSA, [place]  Still having fever, and respiratory secretions  Day 6 of vancomycin, will continue for now  Changed from zosyn  to [place] 7/23, maintain for now  F/U on cultures til finals  Paroxysmal atrial fibrillation w/ RVR  Episodes of A. fib with RVR with respiratory distress in hospital   on eliquis in past but off due to hemoptysis  On heparin IV as bridge to coumadin, INR 1.3. Goal INR 2-3  Continue brilinta due to VA stent  Coreg bid changed to lopressor for rate control  Cerebral vascular  atherosclerosis  CTA head and neck showed right ICA proximal 90% stenosis, left ICA proximal athero, b/l cavernous ICA severe athro R>L, BA severe stenosis, b/l V4 severe athero with right V4 occlusion  S/p left V4 stent and BA revascularization  On brilinta and heparin IV bridge to warfarin  Hemoptysis, improved  Since 08/2016 as per family  TB ruled out in the past  Likely tracheal erosion from right thyroid mass   CT neck showed thyroid carcinoma, encasement of trachea.   Will likely need tertiary center transfer for mass debulking or tracheal resection if pt becomes stable enough for treatment  Sepsis  TMax past 24h 102.5  Leukocytosis resolved 15.9->6.2  Hypotension - resolved, treated w/ levophed  On cefepime -> fortaz and vanco at d/c  CXR - bibasilar atelectasis, CHF  CHF, improved  Off lasix   CXR showed persistent CHF  Off coreg due to hypotension  Gentle hydration  Hypotension, resolved  Treated with maxed out levophed  Coreg and lasix discontinued  Long term BP goal normotensive.  Hyperlipidemia  Lipid lowering medication PTA: Crestor 10 mg daily  LDL 23, goal < 70  On Crestor  Continue statin at discharge  Diabetes  HgbA1c 9.1, goal < 7.0  Uncontrolled  Treated with insulin drip, now off  Continued hyperglycemia  CBG monitoring  Dysphagia   Secondary to stroke   PEG placed by trauma 12/13/2017  NPO. On TF   Other Stroke Risk Factors  Advanced age  Former cigarette smoker - quit, 40 pack year history  Morbid obesity, Body mass index is 46.82 kg/m.    Hx stroke/TIA  Coronary artery disease   PAD with claudication  OSA on CPAP  Other Active Problems  Palliative care consulted to assist with end of life discussions with family.   Hypokalemia, replace as needed  Anemia of critical illness   DISCHARGE EXAM per Dr. Leonie Man Blood pressure (!) 168/93, pulse (!) 109, temperature (!) 102.3 F (39.1 C),  temperature source Axillary, resp. rate (!) 31, height 5' 10"  (1.778 m), weight (!) 148 kg (326 lb 4.5 oz), SpO2 98 %. General - morbid obesity,middle aged 46 male, s/p tracheostomy on low dose sedation. Ophthalmologic - fundi not visualized due to noncooperation. Cardiovascular - irregularly irregular heart rate and rhythm, afib RVR. Neuro - status post tracheostomy on low dose sedation, eyes open, following commands with vertical greater than horizontal eye movement and eye close and opening, not blinking to visual threat bilaterally, PERRL. Weak corneal bilaterally, R>L. Positive gag. Facial symmetry not able to test due to ET tube, not able to move tongue or mouth on command. No movement on all extremities even with pain stimulation, DTR diminished and no babinski. Sensation, coordination and gait not tested.  Discharge Diet   NPO  DISCHARGE PLAN   [facility]  IV heparin bridge to Coumadin for secondary stroke prevention.  Continue Brilinta given vertebral artery stent placement  Continue ventilator weaning  Thyroid cancer treatment as able  Ongoing risk factor control by Attending Physician at [place]  Follow-up in [facility] following discharge from [facility]   60 minutes were spent preparing discharge.  Burnetta Sabin, MSN, APRN, ANVP-BC, AGPCNP-BC Advanced Practice Stroke Nurse [place] for Schedule & Pager information 11/30/2017 2:59 PM   I have personally examined this patient, reviewed notes, independently viewed imaging studies, participated in medical decision making and plan of care.ROS completed by me personally and pertinent positives fully documented  I have made any additions or clarifications directly to the above note. Agree with note above.    Antony Contras, MD Medical Director [facility] Stroke Center Pager: [phone] 12/12/2017 3:43 PM

## 2017-12-15 NOTE — Progress Notes (Signed)
Pharmacy Antibiotic Note  [name] [name] is a 62 y.o. male admitted on 11/18/2017 with pneumonia.  Pharmacy has been consulted for Vancomycin dosing.  Vanc trough today - 8 mcg/ml  Plan: Increase Vancomycin 1000 mg iv q8hr (goal trough 15 - 20 mcg/ml)  Will monitor renal function and C&S. Vancomycin trough when at steady state.  Height: 5\' 10"  (177.8 cm) Weight: (!) 326 lb 4.5 oz (148 kg) IBW/kg (Calculated) : 73  Temp (24hrs), Avg:101.2 F (38.4 C), Min:100.1 F (37.8 C), Max:102.5 F (39.2 C)  Recent Labs  Lab 12/10/17 0500 12/11/17 0429 12/12/17 0544 12/13/17 0515 12/13/17 1219 12/14/17 0433 12/16/2017 0627 12/22/2017 1230  WBC 15.5* 7.6 6.4 6.6  --  7.6 6.2  --   CREATININE 0.46* 0.45* 0.48* 0.49*  --  0.49*  --   --   VANCOTROUGH  --   --   --   --  7*  --   --  8*    Estimated Creatinine Clearance: 141.3 mL/min (A) (by C-G formula based on SCr of 0.49 mg/dL (L)).    No Known Allergies  Antimicrobials: Zosyn 7/19 >>7/23 Ceftaz 7/23>>  Vanc 7/19 >>   Microbiology results: 7/15 TA: MRSA and Providencia rettgeri 7/18 TA: Staph aureas (susc pending)  Thank you for allowing pharmacy to be a part of this patient's care.  Corinda Gubler, PharmD, [facility] 12/19/2017 1:22 PM

## 2017-12-15 NOTE — Progress Notes (Signed)
STROKE TEAM PROGRESS NOTE   SUBJECTIVE (INTERVAL HISTORY) His Daughter is at bedside . Patient  is tolerating PEG tube feeding well so far.  He was on weaning mode on the ventilator yesterday during the day but had to be put back on the ventilator overnight. [facility].   OBJECTIVE Temp:  [100.1 F (37.8 C)-102.5 F (39.2 C)] 102.3 F (39.1 C) (07/24 1200) Pulse Rate:  [87-136] 109 (07/24 1300) Cardiac Rhythm: Atrial fibrillation (07/24 1200) Resp:  [0-33] 31 (07/24 1300) BP: (113-172)/(65-93) 168/93 (07/24 1300) SpO2:  [91 %-100 %] 98 % (07/24 1300) FiO2 (%):  [40 %-50 %] 40 % (07/24 1106)  CBC:  Recent Labs  Lab 12/11/17 0429  12/13/17 0515 12/14/17 0433 12/05/2017 0627  WBC 7.6   < > 6.6 7.6 6.2  NEUTROABS 6.0  --  4.6  --   --   HGB 9.3*   < > 9.5* 10.1* 9.2*  HCT 30.1*   < > 31.1* 33.8* 31.1*  MCV 88.8   < > 89.6 90.6 91.5  PLT 283   < > 261 272 239   < > = values in this interval not displayed.    Basic Metabolic Panel:  Recent Labs  Lab 12/09/17 2014 12/10/17 0500  12/13/17 0515 12/14/17 0433  NA 140 140   < > 144 144  K 3.8 3.6   < > 3.4* 3.5  CL 105 105   < > 113* 112*  CO2 27 26   < > 25 22  GLUCOSE 176* 160*   < > 144* 215*  BUN 21 17   < > 16 18  CREATININE 0.49* 0.46*   < > 0.49* 0.49*  CALCIUM 8.8* 8.6*   < > 8.9 9.0  MG 1.9 1.9  --   --  1.9  PHOS <1.0* 2.1*  --   --   --    < > = values in this interval not displayed.    Lipid Panel:     Component Value Date/Time   CHOL 110 11/19/2017 0437   CHOL 119 08/09/2017 1000   TRIG 340 (H) 11/19/2017 0437   HDL 19 (L) 11/19/2017 0437   HDL 29 (L) 08/09/2017 1000   CHOLHDL 5.8 11/19/2017 0437   VLDL 68 (H) 11/19/2017 0437   LDLCALC 23 11/19/2017 0437   LDLCALC 30 08/09/2017 1000   HgbA1c:  Lab Results  Component Value Date   HGBA1C 9.1 (H) 11/19/2017   Urine Drug Screen: No results found for: LABOPIA, COCAINSCRNUR, LABBENZ, AMPHETMU, THCU, LABBARB   Alcohol Level No results found for: [place] I have personally reviewed the radiological images below and agree with the radiology interpretations.  Ct Angio Head W Or Wo Contrast  Ct Angio Neck W Or Wo Contrast 11/18/2017 Since the study of earlier today, there are only 2 changes. There is new consolidation and volume loss in the right upper lobe. 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 Flow is seen distal to that however, presumably indicating patent posterior communicating arteries.   Ct Angio Head W Or Wo Contrast Ct Angio Neck W Or Wo Contrast 11/18/2017 1. Severe intracranial and extracranial atherosclerotic disease. No emergent large vessel occlusion  2. Severe stenosis right carotid bifurcation due to calcified plaque, estimated 90% diameter stenosis. No significant left carotid stenosis. Severe stenosis cavernous carotid bilaterally.  3. Both vertebral arteries are patent in the neck with severe calcific stenosis V4 segment  bilaterally.  4. 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 Small calcified lymph nodes left neck suspicious for metastatic disease  5. Small lung nodules on the right, possible metastatic disease. Recommend chest CT for further evaluation.   Ct Head Wo Contrast 11/19/2017 1. Expected evolution of brainstem infarct without hemorrhage.  2. No new infarcts.  3. Left vertebral artery and basilar artery stent.  4. Acute on chronic sinus disease may be related to intubation.   Ct Chest W Contrast 11/18/2017 Right upper lobe airspace opacity is noted with air bronchograms concerning for pneumonia or possibly atelectasis. Stable mediastinal adenopathy is noted compared to prior exam. Endotracheal and nasogastric tubes are in grossly good position. Coronary artery calcifications are noted suggesting coronary artery disease. Possible 2.8 cm left thyroid nodule. Thyroid ultrasound is recommended for further evaluation. Aortic  Atherosclerosis (ICD10-I70.0).   Mr Brain Wo Contrast 11/19/2017 1. 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 No associated hemorrhage or significant mass effect.  2. Additional punctate acute/early subacute infarctions within left lateral cerebellum and right parietal cortex.  3. Minimal chronic microvascular ischemic changes of the brain and mild parenchymal volume loss.   Ct Head Code Stroke Wo Contrast 11/18/2017 1. No acute intracranial abnormality.  Atherosclerotic calcification  2. ASPECTS is 10   Cerebral Angiogram / Stent  11/18/2017 1. 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 Occluded non dominant RT VBJ. 2. S/P endovascular revascularization of the distal basilar artery  To 90 % patency , and of 30 to 40 % of the LT VBJ with stent placement. Dominant  Lt Pcom.  Transthoracic Echocardiogram - Left ventricle: The cavity size was moderately dilated. Wall thickness was increased in a pattern of severe LVH. Systolic function was normal. The estimated ejection fraction was in the range of 55% to 60%. Wall motion was normal; there were no regional wall motion abnormalities. The study is not technically sufficient to allow evaluation of LV diastolic function. - Aortic valve: Trileaflet. Sclerosis without stenosis. There was no regurgitation. Valve area (Vmax): 2.88 cm^2. - Aorta: Aortic root ML diameter: 41.95 mm (ED). - Aortic root: The aortic root is mildly dilated. - Mitral valve: Calcified annulus. There was trivial regurgitation. - Left atrium: The atrium was mildly dilated. - Right ventricle: The cavity size was mildly dilated. Mildly reduced systolic function. - Inferior vena cava: The vessel was normal in size. The respirophasic diameter changes were in the normal range (>= 50%), consistent with normal central venous  pressure. Impressions:  Compared to a prior study in 02/2017, there have been no significant changes.  Ct Head Wo Contrast 11/24/2017 1. Continued normal expected interval evolution of brainstem infarct. No evidence for hemorrhagic transformation or other complication.  2. No other new intracranial abnormality.  3. Moderate paranasal sinus disease with bilateral mastoid effusions, likely related intubation.   Dg Chest Port 1 View 11/25/2017 Mild vascular congestion. Tubes and lines as described.   Ct Soft Tissue Neck W Contrast 11/26/2017 Aside from the fact that the patient shows endotracheal intubation and the presence of an orogastric tube, and aside from development of bilateral layering pleural effusions with dependent atelectasis, the findings are unchanged since the previous studies. Irregular mass arising from the inferior left thyroid lobe, measuring approximately 5 x 3.4 x 5.5 cm, with invasion of the surrounding tissues worrisome  for thyroid carcinoma. Partial encasement of the trachea. Broad surface along the esophagus. Small but likely pathologic nodes in the lower neck and superior mediastinum, some with calcification, probably involved by metastatic disease.   Dg Chest Port 1 View 11/29/2017 Tube and catheter positions as described without pneumothorax. There is pulmonary vascular congestion with interstitial edema and small pleural effusions. Suspect a degree of underlying congestive heart failure. No airspace consolidation.    Lymph node, needle/core biopsy - METASTATIC PAPILLARY THYROID CARCINOMA.   PHYSICAL EXAM General - morbid obesity,middle aged Caucasian male, s/p tracheostomy on low dose sedation.  Ophthalmologic - fundi not visualized due to noncooperation.  Cardiovascular - irregularly irregular heart rate and rhythm, afib RVR.  Neuro - status post tracheostomy on low dose sedation, eyes open, following commands with vertical greater than horizontal eye movement and  eye close and opening, not blinking to visual threat bilaterally, PERRL. Weak corneal bilaterally, R>L. Positive gag. Facial symmetry not able to test due to ET tube, not able to move tongue or mouth on command. No movement on all extremities even with pain stimulation, DTR diminished and no babinski. Sensation, coordination and gait not tested.   ASSESSMENT/PLAN Mr. Yanis Larin is a 62 y.o. male with PMH of DM, hemoptysis, OSA, HTN, HLD, CAD, afib off Eliquis due to hemoptysis presenting with slurred speech, right sided weakness. Was intubated for hemoptysis prevention while giving tPA. Symptoms worsened and imaging confirmed BA occlusion. S/p IR with revascularization of BA as well as left VA s/p stenting.  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   MRI head - large acute/early subacute infarction within the central pons involving both sides, greater on the right.  CTA H&N - severe vascular athero including BA stenosis, b/l VA stenosis, right ICA proximal 90% stenosis, b/l cavernous ICA stenosis R>L.  Repeat CTA H/N - new distal BA occlusion  DSA - revascularization of the distal basilar artery to 90 % patency , and of 30 to 40 % of the LT VBJ with stent placement.  CT head 11/24/17 showed normal evolution of brainstem infarct, no acute change  2D Echo - EF 55-60%  LDL - 23  HgbA1c - 9.1  VTE prophylaxis - heparin subq  Eliquis (recently stopped) and plavix prior to admission, now  on IV heparin drip.   And warfarin  Ongoing aggressive stroke risk factor management  Therapy recommendations:   LTAC  Disposition:  LTAC  Palliative care on board  METASTATIC PAPILLARY THYROID CARCINOMA  CTA neck - right large thyroid mass compressing on trachea.   Thyroid mass larger than CT chest in 08/2016  Evidence of tracheal erosion on bronch at this admission  CT neck with contrast showed thyroid carcinoma, encasement of trachea.    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   oncology Dr Learta Codding on board  Respiratory failure  CCM on board  Still on vent  Wean off sedation as able  S/p Trach this am   PAF  Currently in afib RVR with respiratory distress  On eliquis prior but off due to hemoptysis  On heparin IV now  Continue brilinta due to [place] stent  Coreg  bid for rate control  Cerebral vascular athero  CTA head and neck showed right ICA proximal 90% stenosis, left ICA proximal athero, b/l cavernous ICA severe athro R>L, BA severe stenosis, b/l V4 severe athero with right V4 occlusion  S/p left V4 stent and BA revascularization  On brilinta and heparin IV  Hemoptysis, improved  Since 08/2016 as per family  Not sure about etiology  TB ruled out in the past  Likely tracheal erosion from right thyroid mass   CT neck showed thyroid carcinoma, encasement of trachea.   CCM on board  Consider tertiary center transfer for mass debulking or tracheal resection  Sepsis  101.8->102.2->101.6->101.5  Leukocytosis WBC 10.7->12.6->15.9->15.5  Hypotension - on levophed  On cefepime -> zosyn and vanco now  CXR - bibasilar atelectasis, CHF  CHF  Off lasix   CXR showed persistent CHF  Off coreg due to hypotension  Gentle hydration  Hypotension  With maxed on levophed  Coreg and lasix discontinued . Long term BP goal normotensive.  Hyperlipidemia  Lipid lowering medication PTA: Crestor 10 mg daily  LDL 23, goal < 70  On Crestor  Continue statin at discharge  Diabetes  HgbA1c 9.1, goal <  7.0  Uncontrolled  continued on insulin drip  Continued hyperglycemia  CCM on board   CBG monitoring  Dysphagia   Put on cortrak  Resume TF after  Other Stroke Risk Factors  Advanced age  Former cigarette smoker - quit, 40 pack year history  Morbid obesity, Body mass index is 46.82 kg/m.    Hx stroke/TIA  Coronary artery disease   PAD with claudication  OSA on CPAP  Other Active Problems  Palliative care on board  Continue heparin drip  And  start warfarin   Discussed with [email or web address] [facility] for the next few days This patient is critically ill and at significant risk of neurological worsening, death and care requires constant monitoring of vital signs, hemodynamics,respiratory and cardiac monitoring, extensive review of multiple databases, frequent neurological assessment, discussion with family, other specialists and medical decision making of high complexity. I spent 35 minutes of neurocritical care time  in the care of  this patient.   Antony Contras, MD Stroke Neurology 12/10/2017 2:14 PM    To contact Stroke Continuity provider, please refer to [email or web address] After hours, contact General Neurology

## 2017-12-15 NOTE — Progress Notes (Signed)
PULMONARY / CRITICAL CARE MEDICINE   Name: [name] [name] MRN: [id number] DOB: 08-31-1955    ADMISSION DATE:  11/18/2017 CONSULTATION DATE: 11/18/2017  REFERRING MD: Dr. Johnney Killian  CHIEF COMPLAINT: Stroke  HISTORY OF PRESENT ILLNESS:   62 yo male with hx of A fib off anticoagulation in setting of hemoptysis presented with acute CVA.  Intubated and received thrombolytic.  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  Found to have metastatic thyroid cancer.  SUBJECTIVE: No events overnight, remains on full support  VITAL SIGNS: BP (!) 172/77   Pulse (!) 121   Temp (!) 100.6 F (38.1 C) (Axillary)   Resp 20   Ht 5\' 10"  (1.778 m)   Wt (!) 148 kg (326 lb 4.5 oz)   SpO2 100%   BMI 46.82 kg/m   VENTILATOR SETTINGS: Vent Mode: CPAP;PSV FiO2 (%):  [40 %-60 %] 40 % Set Rate:  [20 bmp] 20 bmp PEEP:  [5 cmH20] 5 cmH20 Pressure Support:  [14 cmH20] 14 cmH20 Plateau Pressure:  [17 cmH20-19 cmH20] 18 cmH20  INTAKE / OUTPUT: I/O last 3 completed shifts: In: 6289.6 [I.V.:2545.2; Other:50; NG/GT:1710.6; IV Piggyback:1983.9] Out: 2150 [Urine:2150]  PHYSICAL EXAMINATION:  General - Opens eyes and interacts with opening eyes Eyes - PERRL, EOM-I and MMM ENT - Trach site clean Cardiac - RRR, Nl S1/S2 and -M/R/G Chest - Coarse BS diffusely, increased secretions Abdomen - Soft, NT, ND and +BS.  PEG site clean Extremities - 1+ edema, no cyanosis, or clubbing Skin - Intact Lymphatics - no lymphadenopathy Neuro - follows commands, increase head movements and now able to shake head for yes/no responses  LABS: CBC Recent Labs    12/13/17 0515 12/14/17 0433 11/30/2017 0627  WBC 6.6 7.6 6.2  HGB 9.5* 10.1* 9.2*  HCT 31.1* 33.8* 31.1*  PLT 261 272 239   Coag's Recent Labs    12/14/17 1858 12/12/2017 0627  INR 1.28 1.30   BMET Recent Labs    12/13/17 0515 12/14/17 0433  NA 144 144  K 3.4* 3.5  CL 113* 112*  CO2 25 22  BUN 16 18   CREATININE 0.49* 0.49*  GLUCOSE 144* 215*   Electrolytes Recent Labs    12/13/17 0515 12/14/17 0433  CALCIUM 8.9 9.0  MG  --  1.9   Sepsis Markers No results for input(s): PROCALCITON, O2SATVEN in the last 72 hours.  Invalid input(s): LACTICACIDVEN  ABG Recent Labs    12/13/17 0455  PHART 7.488*  PCO2ART 32.8  PO2ART 57.0*    Liver Enzymes No results for input(s): AST, ALT, ALKPHOS, BILITOT, ALBUMIN in the last 72 hours.  Cardiac Enzymes No results for input(s): TROPONINI, PROBNP in the last 72 hours.  Glucose Recent Labs    12/14/17 1144 12/14/17 1605 12/14/17 1936 12/14/17 2329 12/18/2017 0324 12/03/2017 0809  GLUCAP 189* 193* 208* 202* 210* 211*    Imaging Dg Chest Port 1 View  Result Date: 12/14/2017 CLINICAL DATA:  62 year old male with respiratory failure. Subsequent encounter. EXAM: PORTABLE CHEST 1 VIEW COMPARISON:  12/12/2017 chest x-ray. FINDINGS: Tracheostomy tube tip midline 7.8 cm above the carina. Right central line tip mid to distal superior vena cava level. No pneumothorax. Diffuse slightly asymmetric airspace disease has progressed slightly since the prior exam (right lower lobe). Small left-sided pleural effusion. Findings may reflect changes of pulmonary edema superimposed upon chronic changes although basilar infiltrates not excluded in the proper clinical setting. Cardiomegaly. Calcified aorta. Feeding tube is been removed. IMPRESSION:  Diffuse slightly asymmetric airspace disease has progressed slightly since the prior exam (right lower lobe). Small left-sided pleural effusion. Findings may reflect changes of pulmonary edema superimposed upon chronic changes although basilar infiltrates not excluded in the proper clinical setting. Aortic Atherosclerosis (ICD10-I70.0). Electronically Signed   By: Genia Del M.D.   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  He has some improvement in head movements, mental status intact, and is starting to tolerate some pressure support weaning.  Good candidate for LTAC placement.  ASSESSMENT/PLAN:  Acute respiratory failure. Failure to wean s/p tracheostomy by ENT. - Attempt PS wean, maintain as tolerated - Trach care as ordered - D/C precedex  HCAP with MRSA, Providencia. - Still having fever, and respiratory secretions - Day 6 of vancomycin, will continue for now - Changed from zosyn to [place] 7/23, maintain for now - F/U on cultures til finals  Large pontine infarct, punctate Lt cerebellar and Rt MCA/ACA infarcts. S/p revascularization of distal basilar artery, and stent placement to Lt vertebrobasilar junction. CVA s/p stenting. - ASA, brilinta  Atrial fibrillation, HLD. - Heparin gtt >> defer to neuro about transitioning to enteral anticoagulation - Crestor - Increase lopressor to 50 mg PO BID with holding parameters  Hypokalemia. - Replace as needed - F/u BMET intermittently  Dysphagia. - S/p PEG 7/22 by CCS  Metastatic thyroid cancer. - Seen by oncology - If neuro and respiratory status improve, then could consider surgery and radioablation  DM. - SSI with lantus  Anemia of critical illness. - F/u CBC intermittently  Spoke with sister and daughter at length, all questions answered, will attempt weaning today and more after stopping sedation.  Continue  abx for now.  PCCM will continue to follow for vent management, medical management and to determine placement needs.  The patient is critically ill with multiple organ systems failure and requires high complexity decision making for assessment and support, frequent evaluation and titration of therapies, application of advanced monitoring technologies and extensive interpretation of multiple databases.   Critical Care Time devoted to patient care services described in this note is  32  Minutes. This time reflects time of care of this signee Dr [name]. This critical care time does not reflect procedure time, or teaching time or supervisory time of PA/NP/Med student/Med Resident etc but could involve care discussion time.  Rush Farmer, M.D. [facility] Pulmonary/Critical Care Medicine. Pager: [phone]. After hours pager: [phone].  12/05/2017, 8:50 AM

## 2017-12-15 NOTE — Progress Notes (Signed)
  Speech Language Pathology Treatment: (communication)  Patient Details Name: [name] [name] MRN: [id number] DOB: 1955/09/15 Today's Date: 12/20/2017 Time: 4142-3953 SLP Time Calculation (min) (ACUTE ONLY): 35 min  Assessment / Plan / Recommendation Clinical Impression  Pt continues to be consistent using yes/no eye gaze board (100%) with questionable response x 1. Able to make simple needs known re: repositioning head, light off/on, wipe eyes and respiratory status etc using board. Left sign above head of bed with instructions re: pt looks to his left for "no" and his left for "yes" for quick reference with staff/visitors. He has tolerated CPAP since this morning per RN and appeared to have increased work of breathing however able to communicate his breathing was okay at present time with this SLP. Moved head on command slightly left and right but not vertical.Tongue noted to protrude between upper and lower dentition with pressure on tongue and [name] [name] conveyed he is unable to sense position of his tongue and was able to retract on command. Slight volitional lower labial movement observed in response to "how many children do you have?" Plans are to transfer to [facility] this evening and continued ST on that venue continuing to facilitate communication of his wants and needs.    HPI HPI: [name] [name]  is a 62 year old man with atrial fibrillation, formally on anticoagulation was stopped due to recent hemoptysis.  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  He presented with an acute stroke 6/27, was intubated and potential tracheal erosion site covered with cuff balloon to facilitate TPA.  Unfortunately during the hospitalization is also experienced a second stroke and underwent mechanical thrombectomy, stent placement 7/1. MRI shows Acute/early subacute infarction within the central pons involving both sides, greater on the right, measuring up to 2.6 cm,  3.6 cc. Additional punctate acute/early subacute infarctions within left lateral cerebellum and right parietal cortex. 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 Orally intubated at the time of initial assessment.       SLP Plan  Continue with current plan of care       Recommendations                   Oral Care Recommendations: Oral care QID Follow up Recommendations: LTACH SLP Visit Diagnosis: Dysarthria and anarthria (R47.1) Plan: Continue with current plan of care       GO                [name] [name] 11/30/2017, 2:18 PM   [name] [name].[employer] [phone]

## 2017-12-15 NOTE — Care Management (Signed)
Case manager received call from [facility] with [facility], they have received Insurance approval for patient. Case manager asked bedside RN to contact attending for discharge orders to [facility].

## 2017-12-15 NOTE — Progress Notes (Signed)
Palliative:  [name] [name] is being bathed by nursing. I met briefly with daughter, [name], to offer support. I provided her with information for [facility] as she is trying to drive and be here (from [place], [place]) as much as possible. Offered listening and [name] continues to be hopeful for improvement. I continue to have concern for QOL.   Noted plans to transition to LTAC. Palliative does not follow at [facility]. Recommend for SLP to continue working on communication with hopes that [name] can participate in continued [place] discussions.   No charge  Vinie Sill, NP Palliative Medicine Team Pager # [phone] (M-F 8a-5p) Team Phone # [phone] (Nights/Weekends)

## 2017-12-15 NOTE — Progress Notes (Signed)
Report given to [place], RN. Pt transferred to [id number]

## 2017-12-15 NOTE — Progress Notes (Signed)
Pt transported to [facility] with nursing staff without complication.

## 2017-12-15 NOTE — Progress Notes (Signed)
OT Cancellation Note  Patient Details Name: [name] [name] MRN: [id number] DOB: 17-May-1956   Cancelled Treatment:    Reason Eval/Treat Not Completed: Other (comment)([facility] at [facility] - will defer evaluation to next venue)  Jaci Carrel 11/23/2017, 3:22 PM  Hulda Humphrey OTR/L [phone]

## 2017-12-15 NOTE — Progress Notes (Signed)
Inpatient Diabetes Program Recommendations  AACE/ADA: New Consensus Statement on Inpatient Glycemic Control (2015)  Target Ranges:  Prepandial:   less than 140 mg/dL      Peak postprandial:   less than 180 mg/dL (1-2 hours)      Critically ill patients:  140 - 180 mg/dL   Lab Results  Component Value Date   GLUCAP 203 (H) 12/19/2017   HGBA1C 9.1 (H) 11/19/2017    Review of Glycemic ControlResults for [name] [name], [name] (MRN [id number]) as of 12/02/2017 14:21  Ref. Range 12/14/2017 19:36 12/14/2017 23:29 11/23/2017 03:24 12/17/2017 08:09 12/07/2017 11:54  Glucose-Capillary Latest Ref Range: 70 - 99 mg/dL 208 (H) 202 (H) 210 (H) 211 (H) 203 (H)   Diabetes history: Type 2 DM  Outpatient Diabetes medications: Victoza 1.2 mg daily, Metformin 1000 mg bid Current orders for Inpatient glycemic control:  Novolog moderate q 4 hours, Lantus 8 units daily  Inpatient Diabetes Program Recommendations:   Please consider increasing Lantus to 8 units bid.  May also need to add Novolog tube feed coverage 3 units q 4 hours.    Thanks, Adah Perl, RN, BC-ADM Inpatient Diabetes Coordinator Pager [phone] (8a-5p)

## 2017-12-16 ENCOUNTER — Encounter (HOSPITAL_COMMUNITY): Payer: Self-pay | Admitting: General Surgery

## 2017-12-16 DIAGNOSIS — R042 Hemoptysis: Secondary | ICD-10-CM

## 2017-12-16 DIAGNOSIS — I480 Paroxysmal atrial fibrillation: Secondary | ICD-10-CM

## 2017-12-16 DIAGNOSIS — I509 Heart failure, unspecified: Secondary | ICD-10-CM

## 2017-12-16 DIAGNOSIS — J9621 Acute and chronic respiratory failure with hypoxia: Secondary | ICD-10-CM

## 2017-12-16 DIAGNOSIS — R6520 Severe sepsis without septic shock: Secondary | ICD-10-CM

## 2017-12-16 DIAGNOSIS — A419 Sepsis, unspecified organism: Secondary | ICD-10-CM

## 2017-12-16 DIAGNOSIS — C73 Malignant neoplasm of thyroid gland: Secondary | ICD-10-CM

## 2017-12-16 DIAGNOSIS — I651 Occlusion and stenosis of basilar artery: Secondary | ICD-10-CM

## 2017-12-16 LAB — CBC WITH DIFFERENTIAL/PLATELET
ABS IMMATURE GRANULOCYTES: 0.1 10*3/uL (ref 0.0–0.1)
BASOS PCT: 0 %
Basophils Absolute: 0.1 10*3/uL (ref 0.0–0.1)
Eosinophils Absolute: 0.7 10*3/uL (ref 0.0–0.7)
Eosinophils Relative: 5 %
HCT: 36.5 % — ABNORMAL LOW (ref 39.0–52.0)
Hemoglobin: 10.8 g/dL — ABNORMAL LOW (ref 13.0–17.0)
IMMATURE GRANULOCYTES: 1 %
LYMPHS ABS: 1.8 10*3/uL (ref 0.7–4.0)
LYMPHS PCT: 14 %
MCH: 27.1 pg (ref 26.0–34.0)
MCHC: 29.6 g/dL — ABNORMAL LOW (ref 30.0–36.0)
MCV: 91.5 fL (ref 78.0–100.0)
MONO ABS: 0.7 10*3/uL (ref 0.1–1.0)
MONOS PCT: 5 %
NEUTROS ABS: 9.7 10*3/uL — AB (ref 1.7–7.7)
NEUTROS PCT: 75 %
PLATELETS: 344 10*3/uL (ref 150–400)
RBC: 3.99 MIL/uL — ABNORMAL LOW (ref 4.22–5.81)
RDW: 15.3 % (ref 11.5–15.5)
WBC: 13 10*3/uL — ABNORMAL HIGH (ref 4.0–10.5)

## 2017-12-16 LAB — BASIC METABOLIC PANEL
ANION GAP: 6 (ref 5–15)
BUN: 12 mg/dL (ref 8–23)
CALCIUM: 9.5 mg/dL (ref 8.9–10.3)
CO2: 26 mmol/L (ref 22–32)
Chloride: 111 mmol/L (ref 98–111)
Creatinine, Ser: 0.49 mg/dL — ABNORMAL LOW (ref 0.61–1.24)
GFR calc Af Amer: >60 mL/min (ref >60)
GFR calc non Af Amer: >60 mL/min (ref >60)
GLUCOSE: 230 mg/dL — AB (ref 70–99)
POTASSIUM: 4.5 mmol/L (ref 3.5–5.1)
SODIUM: 143 mmol/L (ref 135–145)

## 2017-12-16 LAB — HEPARIN LEVEL (UNFRACTIONATED)
Heparin Unfractionated: 0.52 IU/mL (ref 0.30–0.70)
Heparin Unfractionated: <0.1 IU/mL — ABNORMAL LOW (ref 0.30–0.70)

## 2017-12-16 LAB — PROTIME-INR
INR: 1.37
Prothrombin Time: 16.7 seconds — ABNORMAL HIGH (ref 11.4–15.2)

## 2017-12-16 LAB — VANCOMYCIN, TROUGH: VANCOMYCIN TR: 13 ug/mL — AB (ref 15–20)

## 2017-12-16 NOTE — Progress Notes (Signed)
Entry after Discharge as PT asked to score Modified Rankin by [facility]. Score based on review of medical record as no PT or OT evaluation completed.    12/21/2017 1335  Modified Rankin (Stroke Patients Only)  Pre-Morbid Rankin Score 0  Modified Rankin [place], [place]  [phone] 12/16/2017

## 2017-12-16 NOTE — Consult Note (Signed)
Pulmonary [place]  PULMONARY SERVICE  Date of Service: 12/16/2017  PULMONARY [place]  ZOX:[id number]  DOB: 1956-04-25   DOA: 11/26/2017  Referring Physician: Merton Border, MD  HPI: [name] [name] is a 62 y.o. male seen for follow up of Acute on Chronic Respiratory Failure.  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  Patient presented to the hospital with as a code stroke.  The patient was apparently in bed woke up and felt normal however went to the door and outside and then called his girlfriend saying that he was having slurred speech.  The patient was brought to the ED by EMS.  In the ED he was noted to be hypertensive and his CT scan did not show anything significant.  Patient was also having hemoptysis at the time was seen by pulmonology was intubated and had a bronchoscopy at the bedside before TPA was given.  After pulmonary evaluated the patient and have the patient intubated he was given IV TPA.  Patient was not initially doing well however then began to have coughing and had to be more deeply sedated.  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  In addition patient was found to have a thyroid mass which was highly suspicious for carcinoma.  In addition he had lymph nodes that were enlarged suspicious for metastatic disease.  Patient also had pulmonary nodules which also were concerning for metastatic disease.  Patient apparently did undergo biopsy and it was positive for metastatic adenocarcinoma.  The situation was discussed with the patient and patient wanted ongoing aggressive measures so patient ultimately underwent a tracheostomy.  He is now transferred to our facility for further weaning and management  Review of Systems:  ROS performed and is unremarkable other than  noted above.  Past Medical History:  Diagnosis Date  . Collagen vascular disease ([place])   . Coronary artery disease   . GERD (gastroesophageal reflux disease)   . High cholesterol   . Hypertension   . OSA (obstructive sleep apnea)    "dx'd years ago; never have worn mask; mask never ordered" (04/01/2017)  . Pneumonia 2011  . Type II diabetes mellitus ([place]) 07/2016    Past Surgical History:  Procedure Laterality Date  . CORONARY ANGIOPLASTY WITH STENT PLACEMENT  2011; 07/2016   s/p PCI  . IR ANGIO EXTRACRAN SEL COM CAROTID INNOMINATE UNI BILAT MOD SED  11/18/2017  . IR ANGIO VERTEBRAL SEL SUBCLAVIAN INNOMINATE UNI R MOD SED  11/18/2017  . IR INTRA CRAN STENT  11/18/2017  . IR US GUIDE BX ASP/DRAIN  11/29/2017  . LOWER EXTREMITY ANGIOGRAPHY Bilateral 04/01/2017   Procedure: Lower Extremity Angiography;  Surgeon: Lorretta Harp, MD;  Location: [facility];  Service: Cardiovascular;  Laterality: Bilateral;  . PERIPHERAL VASCULAR INTERVENTION Left 04/01/2017   Procedure: PERIPHERAL VASCULAR INTERVENTION;  Surgeon: Lorretta Harp, MD;  Location: [facility];  Service: Cardiovascular;  Laterality: Left;  common iliac  . RADIOLOGY WITH ANESTHESIA N/A 11/18/2017   Procedure: RADIOLOGY WITH ANESTHESIA;  Surgeon: Luanne Bras, MD;  Location: [facility];  Service: Radiology;  Laterality: N/A;  . TRACHEOSTOMY TUBE PLACEMENT N/A 12/09/2017   Procedure: TRACHEOSTOMY;  Surgeon: Melida Quitter, MD;  Location: [facility];  Service: ENT;  Laterality: N/A;  . VIDEO BRONCHOSCOPY Bilateral 08/25/2016   Procedure: VIDEO  BRONCHOSCOPY WITHOUT FLUORO;  Surgeon: Collene Gobble, MD;  Location: [facility];  Service: Cardiopulmonary;  Laterality: Bilateral;    Social History:    reports that he quit smoking about 5 years ago. His smoking use included cigarettes. He has a 40.00 pack-year smoking history. He has never used smokeless tobacco. He reports that he drinks alcohol. He reports that he does not use  drugs.  Family History: Non-Contributory to the present illness  No Known Allergies  Medications: Reviewed on Rounds  Physical Exam:  Vitals: Temperature 99.3 pulse 130 respiratory rate 30 blood pressure 167/76 saturations 97%  Ventilator Settings mode of ventilation pressure assist control FiO2 35% tidal volume 503 PEEP 5  . General: Comfortable at this time . Eyes: Grossly normal lids, irises & conjunctiva . ENT: grossly tongue is normal . Neck: no obvious mass . Cardiovascular: S1-S2 normal no gallop or rub at this time but patient is tachycardic and has an irregular rhythm . Respiratory: Coarse breath sounds are noted bilaterally . Abdomen: Obese and soft . Skin: no rash seen on limited exam . Musculoskeletal: not rigid . Psychiatric:unable to assess . Neurologic: no seizure no involuntary movements         Labs on Admission:  Basic Metabolic Panel: Recent Labs  Lab 12/09/17 2014 12/10/17 0500 12/11/17 0429 12/12/17 0544 12/13/17 0515 12/14/17 0433 12/16/17 0727  NA 140 140 141 142 144 144 143  K 3.8 3.6 3.2* 3.2* 3.4* 3.5 4.5  CL 105 105 108 111 113* 112* 111  CO2 27 26 26 25 25 22 26   GLUCOSE 176* 160* 165* 144* 144* 215* 230*  BUN 21 17 15 13 16 18 12   CREATININE 0.49* 0.46* 0.45* 0.48* 0.49* 0.49* 0.49*  CALCIUM 8.8* 8.6* 8.5* 8.7* 8.9 9.0 9.5  MG 1.9 1.9  --   --   --  1.9  --   PHOS <1.0* 2.1*  --   --   --   --   --     Liver Function Tests: Recent Labs  Lab 12/09/17 2014 12/10/17 0500  ALBUMIN 2.2* 2.1*   No results for input(s): LIPASE, AMYLASE in the last 168 hours. No results for input(s): AMMONIA in the last 168 hours.  CBC: Recent Labs  Lab 12/10/17 0500 12/11/17 0429  12/13/17 0515 12/14/17 0433 11/28/2017 0627 12/13/2017 1839 12/16/17 0727  WBC 15.5* 7.6   < > 6.6 7.6 6.2 10.1 13.0*  NEUTROABS 13.6* 6.0  --  4.6  --   --   --  9.7*  HGB 10.8* 9.3*   < > 9.5* 10.1* 9.2* 10.5* 10.8*  HCT 35.2* 30.1*   < > 31.1* 33.8* 31.1* 35.4*  36.5*  MCV 87.6 88.8   < > 89.6 90.6 91.5 91.5 91.5  PLT 412* 283   < > 261 272 239 286 344   < > = values in this interval not displayed.    Cardiac Enzymes: No results for input(s): CKTOTAL, CKMB, CKMBINDEX, TROPONINI in the last 168 hours.  BNP (last 3 results) Recent Labs    11/22/17 0116  BNP 170.4*    ProBNP (last 3 results) No results for input(s): PROBNP in the last 8760 hours.   Radiological Exams on Admission: Dg Abdomen Peg Tube Location  Result Date: 11/28/2017 CLINICAL DATA:  Respiratory failure, peg tube location. EXAM: ABDOMEN - 1 VIEW COMPARISON:  None. FINDINGS: 50 cc Isovue 100 administered through the peg tube. Contrast is seen within the stomach confirming appropriate location. No  extraluminal contrast material identified. IMPRESSION: Contrast administered through the PEG tube confirms appropriate location within the stomach. No extraluminal contrast. Electronically Signed   By: Franki Cabot M.D.   On: 11/28/2017 18:50   Dg Chest Port 1 View  Result Date: 12/22/2017 CLINICAL DATA:  Respiratory failure, peg tube location. EXAM: PORTABLE CHEST 1 VIEW COMPARISON:  Chest x-ray dated 12/14/2017 and chest x-ray dated 12/12/2017. FINDINGS: Stable cardiomegaly. Tracheostomy tube appears adequately positioned in the midline. RIGHT-sided PICC line remains in place but the tip is not clearly seen, suspected to be retracted to the level of the RIGHT subclavian vein. The RIGHT lower lobe opacity is not significantly changed compared to the chest x-ray of 12/14/2017, increased compared to the earlier chest x-ray of 12/12/2017, pneumonia versus asymmetric edema. Again suspected small LEFT-sided pleural effusion. No pneumothorax seen. IMPRESSION: 1. RIGHT-sided PICC line is not clearly seen but I suspect that the tip has been retracted to the RIGHT subclavian vein. Recommend advancing with follow-up chest x-ray to ensure appropriate positioning. 2. Ill-defined opacity persists at the  RIGHT lung base, not significantly changed compared to yesterday's exam, increased compared to earlier chest x-ray of 12/12/2017, pneumonia versus asymmetric pulmonary edema. 3. Stable cardiomegaly. These results will be called to the ordering clinician or representative by the Radiologist Assistant, and communication documented in the PACS or zVision Dashboard. Electronically Signed   By: Franki Cabot M.D.   On: 11/26/2017 18:49   Dg Chest Port 1 View  Result Date: 12/14/2017 CLINICAL DATA:  62 year old male with respiratory failure. Subsequent encounter. EXAM: PORTABLE CHEST 1 VIEW COMPARISON:  12/12/2017 chest x-ray. FINDINGS: Tracheostomy tube tip midline 7.8 cm above the carina. Right central line tip mid to distal superior vena cava level. No pneumothorax. Diffuse slightly asymmetric airspace disease has progressed slightly since the prior exam (right lower lobe). Small left-sided pleural effusion. Findings may reflect changes of pulmonary edema superimposed upon chronic changes although basilar infiltrates not excluded in the proper clinical setting. Cardiomegaly. Calcified aorta. Feeding tube is been removed. IMPRESSION: Diffuse slightly asymmetric airspace disease has progressed slightly since the prior exam (right lower lobe). Small left-sided pleural effusion. Findings may reflect changes of pulmonary edema superimposed upon chronic changes although basilar infiltrates not excluded in the proper clinical setting. Aortic Atherosclerosis (ICD10-I70.0). Electronically Signed   By: Genia Del M.D.   On: 12/14/2017 07:14    Assessment/Plan Active Problems:   OSA (obstructive sleep apnea)   PAF (paroxysmal atrial fibrillation) (HCC)   Basilar artery occlusion   Primary papillary adenocarcinoma of thyroid gland (HCC)   HCAP (healthcare-associated pneumonia)   Severe sepsis (HCC)   CHF (congestive heart failure) (HCC)   Acute on chronic respiratory failure with hypoxia ([place])   1. Acute on  chronic respiratory failure with hypoxia at this time because of the tachycardia I do not think he is a good candidate to try to attempt weaning.  Heart rate needs to be under better control.  We will discuss with the primary care team. 2. Paroxysmal atrial fibrillation currently he is tachycardic again we will discuss with the primary care team to assess with the management of the heart rate. 3. Primary papillary adenocarcinoma the patient has metastatic disease prognosis is quite guarded.  Will need oncology follow-up afterwards. 4. Acute stroke now is stabilized we will continue with supportive care 5. Hemoptysis clinically resolved we will continue to monitor he is at high risk airway. 6. Sepsis hemodynamically stable we will continue with present management. 7.  Congestive heart failure monitor fluid status diuresis as tolerated.  I have personally seen and evaluated the patient, evaluated laboratory and imaging results, formulated the assessment and plan and placed orders. The Patient requires high complexity decision making for assessment and support.  Case was discussed on Rounds with the Respiratory Therapy Staff Time Spent 11minutes  Allyne Gee, MD [facility] Pulmonary Critical Care Medicine Sleep Medicine

## 2017-12-17 DIAGNOSIS — J189 Pneumonia, unspecified organism: Secondary | ICD-10-CM

## 2017-12-17 LAB — HEMOGLOBIN AND HEMATOCRIT, BLOOD
HEMATOCRIT: 31.2 % — AB (ref 39.0–52.0)
HEMOGLOBIN: 9.2 g/dL — AB (ref 13.0–17.0)

## 2017-12-17 LAB — PROTIME-INR
INR: 1.67
PROTHROMBIN TIME: 19.6 s — AB (ref 11.4–15.2)

## 2017-12-17 NOTE — Progress Notes (Addendum)
Pulmonary [place]   PULMONARY SERVICE  PROGRESS NOTE  Date of Service: 12/17/2017  [name] [name]  PYP:[id number]  DOB: [birth year]   DOA: 11/22/2017  Referring Physician: Merton Border, MD  HPI: [name] [name] is a 62 y.o. male seen for follow up of Acute on Chronic Respiratory Failure.  Patient is currently on full support on pressure assist control mode has not been tolerating the weaning attempts.  The spontaneous breathing index has been poor right now is comfortable  Medications: Reviewed on Rounds  Physical Exam:  Vitals: Temperature 99.0 pulse 116 respiratory rate 27 blood pressure 154/84 saturations 95%  Ventilator Settings currently on pressure assist control mode tidal volume 490 PEEP 5  . General: Comfortable at this time . Eyes: Grossly normal lids, irises & conjunctiva . ENT: grossly tongue is normal . Neck: no obvious mass . Cardiovascular: S1 S2 normal no gallop . Respiratory: No rhonchi or rales are noted . Abdomen: soft . Skin: no rash seen on limited exam . Musculoskeletal: not rigid . Psychiatric:unable to assess . Neurologic: no seizure no involuntary movements         Lab Data:   Basic Metabolic Panel: Recent Labs  Lab 12/11/17 0429 12/12/17 0544 12/13/17 0515 12/14/17 0433 12/16/17 0727  NA 141 142 144 144 143  K 3.2* 3.2* 3.4* 3.5 4.5  CL 108 111 113* 112* 111  CO2 26 25 25 22 26   GLUCOSE 165* 144* 144* 215* 230*  BUN 15 13 16 18 12   CREATININE 0.45* 0.48* 0.49* 0.49* 0.49*  CALCIUM 8.5* 8.7* 8.9 9.0 9.5  MG  --   --   --  1.9  --     Liver Function Tests: No results for input(s): AST, ALT, ALKPHOS, BILITOT, PROT, ALBUMIN in the last 168 hours. No results for input(s): LIPASE, AMYLASE in the last 168 hours. No results for input(s): AMMONIA in the last 168 hours.  CBC: Recent Labs  Lab 12/11/17 0429  12/13/17 0515 12/14/17 0433 12/02/2017 0627 12/18/2017 1839 12/16/17 0727 12/17/17 0704   WBC 7.6   < > 6.6 7.6 6.2 10.1 13.0*  --   NEUTROABS 6.0  --  4.6  --   --   --  9.7*  --   HGB 9.3*   < > 9.5* 10.1* 9.2* 10.5* 10.8* 9.2*  HCT 30.1*   < > 31.1* 33.8* 31.1* 35.4* 36.5* 31.2*  MCV 88.8   < > 89.6 90.6 91.5 91.5 91.5  --   PLT 283   < > 261 272 239 286 344  --    < > = values in this interval not displayed.    Cardiac Enzymes: No results for input(s): CKTOTAL, CKMB, CKMBINDEX, TROPONINI in the last 168 hours.  BNP (last 3 results) Recent Labs    11/22/17 0116  BNP 170.4*    ProBNP (last 3 results) No results for input(s): PROBNP in the last 8760 hours.  Radiological Exams: Dg Abdomen Peg Tube Location  Result Date: 11/24/2017 CLINICAL DATA:  Respiratory failure, peg tube location. EXAM: ABDOMEN - 1 VIEW COMPARISON:  None. FINDINGS: 50 cc Isovue 100 administered through the peg tube. Contrast is seen within the stomach confirming appropriate location. No extraluminal contrast material identified. IMPRESSION: Contrast administered through the PEG tube confirms appropriate location within the stomach. No extraluminal contrast. Electronically Signed   By: Franki Cabot M.D.   On: 11/24/2017 18:50   Dg Chest Port 1 View  Result Date: 12/20/2017  CLINICAL DATA:  Respiratory failure, peg tube location. EXAM: PORTABLE CHEST 1 VIEW COMPARISON:  Chest x-ray dated 12/14/2017 and chest x-ray dated 12/12/2017. FINDINGS: Stable cardiomegaly. Tracheostomy tube appears adequately positioned in the midline. RIGHT-sided PICC line remains in place but the tip is not clearly seen, suspected to be retracted to the level of the RIGHT subclavian vein. The RIGHT lower lobe opacity is not significantly changed compared to the chest x-ray of 12/14/2017, increased compared to the earlier chest x-ray of 12/12/2017, pneumonia versus asymmetric edema. Again suspected small LEFT-sided pleural effusion. No pneumothorax seen. IMPRESSION: 1. RIGHT-sided PICC line is not clearly seen but I suspect that  the tip has been retracted to the RIGHT subclavian vein. Recommend advancing with follow-up chest x-ray to ensure appropriate positioning. 2. Ill-defined opacity persists at the RIGHT lung base, not significantly changed compared to yesterday's exam, increased compared to earlier chest x-ray of 12/12/2017, pneumonia versus asymmetric pulmonary edema. 3. Stable cardiomegaly. These results will be called to the ordering clinician or representative by the Radiologist Assistant, and communication documented in the PACS or zVision Dashboard. Electronically Signed   By: Franki Cabot M.D.   On: 12/13/2017 18:49    Assessment/Plan Active Problems:   OSA (obstructive sleep apnea)   PAF (paroxysmal atrial fibrillation) (HCC)   Basilar artery occlusion   Primary papillary adenocarcinoma of thyroid gland (HCC)   HCAP (healthcare-associated pneumonia)   Severe sepsis (HCC)   CHF (congestive heart failure) (HCC)   Acute on chronic respiratory failure with hypoxia ([place])   1. Acute on chronic respiratory failure with hypoxia we will continue with pressure assist control mode titrate oxygen continue pulmonary toilet assess the spontaneous breathing index to try to start weaning in the morning. 2. Paroxysmal atrial fibrillation rate is controlled at this time we will continue to monitor.  However patient has had episodes of tachycardia. 3. Status post stroke basal regarding supportive care restorative therapy 4. Healthcare associated pneumonia treated 5. Severe sepsis hemodynamically stable 6. Congestive heart failure continue to monitor the fluid status patient prognosis is guarded   I have personally seen and evaluated the patient, evaluated laboratory and imaging results, formulated the assessment and plan and placed orders. The Patient requires high complexity decision making for assessment and support.  Case was discussed on Rounds with the Respiratory Therapy Staff time spent 35 minutes discussion with  the staff on rounds as well as review of the medical record  Allyne Gee, MD [facility] Pulmonary Critical Care Medicine Sleep Medicine

## 2017-12-18 DIAGNOSIS — G4733 Obstructive sleep apnea (adult) (pediatric): Secondary | ICD-10-CM

## 2017-12-18 LAB — CBC
HCT: 35.1 % — ABNORMAL LOW (ref 39.0–52.0)
Hemoglobin: 10.5 g/dL — ABNORMAL LOW (ref 13.0–17.0)
MCH: 26.9 pg (ref 26.0–34.0)
MCHC: 29.9 g/dL — AB (ref 30.0–36.0)
MCV: 89.8 fL (ref 78.0–100.0)
PLATELETS: 301 10*3/uL (ref 150–400)
RBC: 3.91 MIL/uL — ABNORMAL LOW (ref 4.22–5.81)
RDW: 16.3 % — AB (ref 11.5–15.5)
WBC: 12.1 10*3/uL — AB (ref 4.0–10.5)

## 2017-12-18 LAB — COMPREHENSIVE METABOLIC PANEL
ALK PHOS: 59 U/L (ref 38–126)
ALT: 19 U/L (ref 0–44)
AST: 17 U/L (ref 15–41)
Albumin: 2.2 g/dL — ABNORMAL LOW (ref 3.5–5.0)
Anion gap: 9 (ref 5–15)
BUN: 12 mg/dL (ref 8–23)
CALCIUM: 9.4 mg/dL (ref 8.9–10.3)
CO2: 29 mmol/L (ref 22–32)
CREATININE: 0.44 mg/dL — AB (ref 0.61–1.24)
Chloride: 102 mmol/L (ref 98–111)
Glucose, Bld: 236 mg/dL — ABNORMAL HIGH (ref 70–99)
Potassium: 3.9 mmol/L (ref 3.5–5.1)
Sodium: 140 mmol/L (ref 135–145)
Total Bilirubin: 0.9 mg/dL (ref 0.3–1.2)
Total Protein: 6.1 g/dL — ABNORMAL LOW (ref 6.5–8.1)

## 2017-12-18 LAB — PROTIME-INR
INR: 1.99
Prothrombin Time: 22.4 seconds — ABNORMAL HIGH (ref 11.4–15.2)

## 2017-12-18 LAB — MAGNESIUM: MAGNESIUM: 1.9 mg/dL (ref 1.7–2.4)

## 2017-12-18 NOTE — Progress Notes (Signed)
Pulmonary [name]   PULMONARY SERVICE  PROGRESS NOTE  Date of Service: 12/18/2017  [name] [name]  HUD:[id number]  DOB: [birth year]   DOA: 11/27/2017  Referring Physician: Merton Border, MD  HPI: [name] [name] is a 62 y.o. male seen for follow up of Acute on Chronic Respiratory Failure.  Patient has been failing weaning attempts the spontaneous breathing index is poor.  Right now is on full support on pressure assist control mode  Medications: Reviewed on Rounds  Physical Exam:  Vitals: Temperature 98.9 pulse 135 respiratory rate 22 blood pressure 175/95 saturations 100%  Ventilator Settings mode of ventilation pressure assist control FiO2 35% PEEP 5 tidal volume 450  . General: Comfortable at this time . Eyes: Grossly normal lids, irises & conjunctiva . ENT: grossly tongue is normal . Neck: no obvious mass . Cardiovascular: S1 S2 normal no gallop . Respiratory: Good air entry no rhonchi expansion is equal bilaterally . Abdomen: soft . Skin: no rash seen on limited exam . Musculoskeletal: not rigid . Psychiatric:unable to assess . Neurologic: no seizure no involuntary movements         Lab Data:   Basic Metabolic Panel: Recent Labs  Lab 12/12/17 0544 12/13/17 0515 12/14/17 0433 12/16/17 0727 12/18/17 0556  NA 142 144 144 143 140  K 3.2* 3.4* 3.5 4.5 3.9  CL 111 113* 112* 111 102  CO2 25 25 22 26 29   GLUCOSE 144* 144* 215* 230* 236*  BUN 13 16 18 12 12   CREATININE 0.48* 0.49* 0.49* 0.49* 0.44*  CALCIUM 8.7* 8.9 9.0 9.5 9.4  MG  --   --  1.9  --  1.9    Liver Function Tests: Recent Labs  Lab 12/18/17 0556  AST 17  ALT 19  ALKPHOS 59  BILITOT 0.9  PROT 6.1*  ALBUMIN 2.2*   No results for input(s): LIPASE, AMYLASE in the last 168 hours. No results for input(s): AMMONIA in the last 168 hours.  CBC: Recent Labs  Lab 12/13/17 0515 12/14/17 0433 12/06/2017 0627 12/18/2017 1839 12/16/17 0727 12/17/17 0704  12/18/17 0556  WBC 6.6 7.6 6.2 10.1 13.0*  --  12.1*  NEUTROABS 4.6  --   --   --  9.7*  --   --   HGB 9.5* 10.1* 9.2* 10.5* 10.8* 9.2* 10.5*  HCT 31.1* 33.8* 31.1* 35.4* 36.5* 31.2* 35.1*  MCV 89.6 90.6 91.5 91.5 91.5  --  89.8  PLT 261 272 239 286 344  --  301    Cardiac Enzymes: No results for input(s): CKTOTAL, CKMB, CKMBINDEX, TROPONINI in the last 168 hours.  BNP (last 3 results) Recent Labs    11/22/17 0116  BNP 170.4*    ProBNP (last 3 results) No results for input(s): PROBNP in the last 8760 hours.  Radiological Exams: No results found.  Assessment/Plan Active Problems:   OSA (obstructive sleep apnea)   PAF (paroxysmal atrial fibrillation) (HCC)   Basilar artery occlusion   Primary papillary adenocarcinoma of thyroid gland (HCC)   HCAP (healthcare-associated pneumonia)   Severe sepsis (HCC)   CHF (congestive heart failure) (HCC)   Acute on chronic respiratory failure with hypoxia ([place])   1. Acute on chronic respiratory failure with hypoxia patient has failed the rapid shallow index today will remain on the ventilator for now.  Will reassess again tomorrow 2. Paroxysmal atrial fibrillation rate controlled we will continue supportive care 3. Basilar artery occlusion with stroke restorative therapy will be continued 4. Primary  papillary carcinoma of the thyroid metastatic disease prognosis poor 5. Severe sepsis hemodynamically stable 6. CHF compensated   I have personally seen and evaluated the patient, evaluated laboratory and imaging results, formulated the assessment and plan and placed orders. The Patient requires high complexity decision making for assessment and support.  Case was discussed on Rounds with the Respiratory Therapy Staff  Allyne Gee, MD [facility] Pulmonary Critical Care Medicine Sleep Medicine

## 2017-12-19 ENCOUNTER — Other Ambulatory Visit (HOSPITAL_COMMUNITY): Payer: Self-pay

## 2017-12-19 LAB — CBC
HCT: 35.8 % — ABNORMAL LOW (ref 39.0–52.0)
Hemoglobin: 10.9 g/dL — ABNORMAL LOW (ref 13.0–17.0)
MCH: 27.4 pg (ref 26.0–34.0)
MCHC: 30.4 g/dL (ref 30.0–36.0)
MCV: 89.9 fL (ref 78.0–100.0)
PLATELETS: 302 10*3/uL (ref 150–400)
RBC: 3.98 MIL/uL — AB (ref 4.22–5.81)
RDW: 16.2 % — ABNORMAL HIGH (ref 11.5–15.5)
WBC: 12.3 10*3/uL — ABNORMAL HIGH (ref 4.0–10.5)

## 2017-12-19 LAB — BLOOD GAS, ARTERIAL
Acid-Base Excess: 5 mmol/L — ABNORMAL HIGH (ref 0.0–2.0)
BICARBONATE: 29.7 mmol/L — AB (ref 20.0–28.0)
FIO2: 40
LHR: 20 {breaths}/min
O2 Saturation: 91.6 %
PATIENT TEMPERATURE: 98.6
PCO2 ART: 50.3 mmHg — AB (ref 32.0–48.0)
PEEP: 5 cmH2O
Pressure control: 18 cmH2O
pH, Arterial: 7.39 (ref 7.350–7.450)
pO2, Arterial: 67.4 mmHg — ABNORMAL LOW (ref 83.0–108.0)

## 2017-12-19 LAB — PROTIME-INR
INR: 2.36
Prothrombin Time: 25.6 seconds — ABNORMAL HIGH (ref 11.4–15.2)

## 2017-12-19 NOTE — Progress Notes (Signed)
Pulmonary [place]   PULMONARY SERVICE  PROGRESS NOTE  Date of Service: 12/19/2017  [name] [name]  BMW:[id number]  DOB: 17-Aug-1955   DOA: 11/27/2017  Referring Physician: Merton Border, MD  HPI: [name] [name] is a 62 y.o. male seen for follow up of Acute on Chronic Respiratory Failure.  Patient has not done well today has had increased work of breathing noted.  Patient also failed a spontaneous breathing trial.  Right now is on 40% remains on pressure assist control  Medications: Reviewed on Rounds  Physical Exam:  Vitals: Temperature 98.4 pulse 101 respiratory rate 38 blood pressure 110/60 saturations were 94%  Ventilator Settings mode of ventilation pressure assist control FiO2 40% tidal volume 400 PEEP 5  . General: Comfortable at this time . Eyes: Grossly normal lids, irises & conjunctiva . ENT: grossly tongue is normal . Neck: no obvious mass . Cardiovascular: S1 S2 normal no gallop . Respiratory: Coarse breath sounds were noted bilaterally no rhonchi . Abdomen: soft . Skin: no rash seen on limited exam . Musculoskeletal: not rigid . Psychiatric:unable to assess . Neurologic: no seizure no involuntary movements         Lab Data:   Basic Metabolic Panel: Recent Labs  Lab 12/13/17 0515 12/14/17 0433 12/16/17 0727 12/18/17 0556  NA 144 144 143 140  K 3.4* 3.5 4.5 3.9  CL 113* 112* 111 102  CO2 25 22 26 29   GLUCOSE 144* 215* 230* 236*  BUN 16 18 12 12   CREATININE 0.49* 0.49* 0.49* 0.44*  CALCIUM 8.9 9.0 9.5 9.4  MG  --  1.9  --  1.9    Liver Function Tests: Recent Labs  Lab 12/18/17 0556  AST 17  ALT 19  ALKPHOS 59  BILITOT 0.9  PROT 6.1*  ALBUMIN 2.2*   No results for input(s): LIPASE, AMYLASE in the last 168 hours. No results for input(s): AMMONIA in the last 168 hours.  CBC: Recent Labs  Lab 12/13/17 0515  12/11/2017 0627 12/01/2017 1839 12/16/17 0727 12/17/17 0704 12/18/17 0556 12/19/17 0826   WBC 6.6   < > 6.2 10.1 13.0*  --  12.1* 12.3*  NEUTROABS 4.6  --   --   --  9.7*  --   --   --   HGB 9.5*   < > 9.2* 10.5* 10.8* 9.2* 10.5* 10.9*  HCT 31.1*   < > 31.1* 35.4* 36.5* 31.2* 35.1* 35.8*  MCV 89.6   < > 91.5 91.5 91.5  --  89.8 89.9  PLT 261   < > 239 286 344  --  301 302   < > = values in this interval not displayed.    Cardiac Enzymes: No results for input(s): CKTOTAL, CKMB, CKMBINDEX, TROPONINI in the last 168 hours.  BNP (last 3 results) Recent Labs    11/22/17 0116  BNP 170.4*    ProBNP (last 3 results) No results for input(s): PROBNP in the last 8760 hours.  Radiological Exams: No results found.  Assessment/Plan Active Problems:   OSA (obstructive sleep apnea)   PAF (paroxysmal atrial fibrillation) (HCC)   Basilar artery occlusion   Primary papillary adenocarcinoma of thyroid gland (HCC)   HCAP (healthcare-associated pneumonia)   Severe sepsis (HCC)   CHF (congestive heart failure) (HCC)   Acute on chronic respiratory failure with hypoxia ([place])   1. Acute on chronic respiratory failure with hypoxia patient is doing not very well at this time with increased work of breathing  as noted above.  Patient also failed spontaneous breathing trial today.  We will let the patient rest and reassess again tomorrow 2. Healthcare associated pneumonia treated with antibiotics follow-up x-ray 3. CHF follow-up x-ray as ordered 4. Severe sepsis hemodynamically stable 5. Primary papillary adenocarcinoma with mets prognosis poor 6. Atrial fibrillation rate is controlled   I have personally seen and evaluated the patient, evaluated laboratory and imaging results, formulated the assessment and plan and placed orders. The Patient requires high complexity decision making for assessment and support.  Case was discussed on Rounds with the Respiratory Therapy Staff  Allyne Gee, MD [facility] Pulmonary Critical Care Medicine Sleep Medicine

## 2017-12-20 LAB — BLOOD GAS, ARTERIAL
ACID-BASE EXCESS: 8.1 mmol/L — AB (ref 0.0–2.0)
Acid-Base Excess: 3.7 mmol/L — ABNORMAL HIGH (ref 0.0–2.0)
BICARBONATE: 32 mmol/L — AB (ref 20.0–28.0)
Bicarbonate: 27.3 mmol/L (ref 20.0–28.0)
FIO2: 35
FIO2: 0.4
LHR: 20 {breaths}/min
O2 Saturation: 96.4 %
O2 Saturation: 95.2 %
PCO2 ART: 38.7 mmHg (ref 32.0–48.0)
PEEP: 5 cmH2O
PEEP: 5 cmH2O
PH ART: 7.473 — AB (ref 7.350–7.450)
PO2 ART: 73.9 mmHg — AB (ref 83.0–108.0)
Patient temperature: 98.6
Patient temperature: 98.6
Pressure control: 18 cmH2O
Pressure control: 18 cmH2O
RATE: 24 resp/min
pCO2 arterial: 44.2 mmHg (ref 32.0–48.0)
pH, Arterial: 7.464 — ABNORMAL HIGH (ref 7.350–7.450)
pO2, Arterial: 82.5 mmHg — ABNORMAL LOW (ref 83.0–108.0)

## 2017-12-20 LAB — PROTIME-INR
INR: 2.77
Prothrombin Time: 29 seconds — ABNORMAL HIGH (ref 11.4–15.2)

## 2017-12-20 NOTE — Progress Notes (Signed)
Pulmonary [place]   PULMONARY SERVICE  PROGRESS NOTE  Date of Service: 12/20/2017  [name] [name]  IFO:[id number]  DOB: [birth year]   DOA: 11/23/2017  Referring Physician: Merton Border, MD  HPI: [name] [name] is a 62 y.o. male seen for follow up of Acute on Chronic Respiratory Failure.  Patient was doing poorly this morning and had to increase the pressure control support was not tolerating pressure support weaning attempts.  Right now is on full support on 45% FiO2  Medications: Reviewed on Rounds  Physical Exam:  Vitals: Temperature 98.5 pulse 95 respiratory 26 blood pressure 145/75 saturations 95%  Ventilator Settings mode of ventilation pressure assist control FiO2 45% driving pressure 18 tidal volume 400 PEEP 5  . General: Comfortable at this time . Eyes: Grossly normal lids, irises & conjunctiva . ENT: grossly tongue is normal . Neck: no obvious mass . Cardiovascular: S1 S2 normal no gallop . Respiratory: Very coarse breath sounds noted bilaterally no crackles . Abdomen: soft . Skin: no rash seen on limited exam . Musculoskeletal: not rigid . Psychiatric:unable to assess . Neurologic: no seizure no involuntary movements         Lab Data:   Basic Metabolic Panel: Recent Labs  Lab 12/14/17 0433 12/16/17 0727 12/18/17 0556  NA 144 143 140  K 3.5 4.5 3.9  CL 112* 111 102  CO2 22 26 29   GLUCOSE 215* 230* 236*  BUN 18 12 12   CREATININE 0.49* 0.49* 0.44*  CALCIUM 9.0 9.5 9.4  MG 1.9  --  1.9    Liver Function Tests: Recent Labs  Lab 12/18/17 0556  AST 17  ALT 19  ALKPHOS 59  BILITOT 0.9  PROT 6.1*  ALBUMIN 2.2*   No results for input(s): LIPASE, AMYLASE in the last 168 hours. No results for input(s): AMMONIA in the last 168 hours.  CBC: Recent Labs  Lab 11/25/2017 0627 12/10/2017 1839 12/16/17 0727 12/17/17 0704 12/18/17 0556 12/19/17 0826  WBC 6.2 10.1 13.0*  --  12.1* 12.3*  NEUTROABS  --   --  9.7*   --   --   --   HGB 9.2* 10.5* 10.8* 9.2* 10.5* 10.9*  HCT 31.1* 35.4* 36.5* 31.2* 35.1* 35.8*  MCV 91.5 91.5 91.5  --  89.8 89.9  PLT 239 286 344  --  301 302    Cardiac Enzymes: No results for input(s): CKTOTAL, CKMB, CKMBINDEX, TROPONINI in the last 168 hours.  BNP (last 3 results) Recent Labs    11/22/17 0116  BNP 170.4*    ProBNP (last 3 results) No results for input(s): PROBNP in the last 8760 hours.  Radiological Exams: Dg Chest Port 1 View  Result Date: 12/19/2017 CLINICAL DATA:  Respiratory distress EXAM: PORTABLE CHEST 1 VIEW COMPARISON:  The twenty-fourth 2019 FINDINGS: Stable tracheostomy tube. The right PICC line is been removed. No pneumothorax. Increasing diffuse interstitial opacities and haziness over the bases. Cardiomegaly. No other change. IMPRESSION: Findings are consistent with worsening pulmonary edema and probable small layering effusions, left greater than right. Electronically Signed   By: Dorise Bullion III M.D   On: 12/19/2017 17:02    Assessment/Plan Active Problems:   OSA (obstructive sleep apnea)   PAF (paroxysmal atrial fibrillation) ([place])   Basilar artery occlusion   Primary papillary adenocarcinoma of thyroid gland ([place])   HCAP (healthcare-associated pneumonia)   Severe sepsis ([place])   CHF (congestive heart failure) ([place])   Acute on chronic respiratory failure with hypoxia (  [place])   1. Acute on chronic respiratory failure with hypoxia failing weaning attempts will maintain right now on pressure assist control mode.  Patient's overall weaning potential is poor secondary to the underlying metastatic malignancy. 2. Paroxysmal atrial fibrillation rate is controlled has periodic tachycardia noted. 3. Primary papillary adenocarcinoma metastatic poor prognosis the primary care is going to discuss with the family regarding overall prognosis and goals of care. 4. Severe sepsis clinically improved hemodynamically stable 5. Congestive heart failure we  will continue to monitor the fluid levels.  We will continue present management. 6. Status post stroke basilar artery occlusion overall again the prognosis is poor multi system involvement   I have personally seen and evaluated the patient, evaluated laboratory and imaging results, formulated the assessment and plan and placed orders. The Patient requires high complexity decision making for assessment and support.  Case was discussed on Rounds with the Respiratory Therapy Staff 35 minutes  Allyne Gee, MD [facility] Pulmonary Critical Care Medicine Sleep Medicine

## 2017-12-21 LAB — BASIC METABOLIC PANEL
ANION GAP: 6 (ref 5–15)
BUN: 16 mg/dL (ref 8–23)
CHLORIDE: 98 mmol/L (ref 98–111)
CO2: 33 mmol/L — ABNORMAL HIGH (ref 22–32)
Calcium: 9.5 mg/dL (ref 8.9–10.3)
Creatinine, Ser: 0.41 mg/dL — ABNORMAL LOW (ref 0.61–1.24)
GFR calc Af Amer: >60 mL/min (ref >60)
GFR calc non Af Amer: >60 mL/min (ref >60)
Glucose, Bld: 289 mg/dL — ABNORMAL HIGH (ref 70–99)
POTASSIUM: 3.9 mmol/L (ref 3.5–5.1)
SODIUM: 137 mmol/L (ref 135–145)

## 2017-12-21 LAB — PROTIME-INR
INR: 2.57
Prothrombin Time: 27.4 seconds — ABNORMAL HIGH (ref 11.4–15.2)

## 2017-12-21 LAB — CBC
HCT: 37.6 % — ABNORMAL LOW (ref 39.0–52.0)
Hemoglobin: 11.3 g/dL — ABNORMAL LOW (ref 13.0–17.0)
MCH: 27 pg (ref 26.0–34.0)
MCHC: 30.1 g/dL (ref 30.0–36.0)
MCV: 90 fL (ref 78.0–100.0)
Platelets: 385 10*3/uL (ref 150–400)
RBC: 4.18 MIL/uL — ABNORMAL LOW (ref 4.22–5.81)
RDW: 16.8 % — AB (ref 11.5–15.5)
WBC: 14.1 10*3/uL — ABNORMAL HIGH (ref 4.0–10.5)

## 2017-12-21 NOTE — Progress Notes (Signed)
Pulmonary [place]   PULMONARY SERVICE  PROGRESS NOTE  Date of Service: 12/21/2017  [name] [name]  KPT:[id number]  DOB: Dec 31, 1955   DOA: 12/02/2017  Referring Physician: Merton Border, MD  HPI: [name] [name] is a 62 y.o. male seen for follow up of Acute on Chronic Respiratory Failure.  Patient has not been tolerating weaning attempts.  This morning was restarted on pressure support and is still however requiring 50% oxygen.  Overall possibility of weaning in this situation is poor  Medications: Reviewed on Rounds  Physical Exam:  Vitals: Temperature 98.4 pulse 102 respiratory rate 34 blood pressure 176/95 saturations 94%  Ventilator Settings mode of ventilation pressure support FiO2 50% tidal volume 433 pressure support 12 PEEP 5  . General: Comfortable at this time . Eyes: Grossly normal lids, irises & conjunctiva . ENT: grossly tongue is normal . Neck: no obvious mass . Cardiovascular: S1 S2 normal no gallop . Respiratory: No rhonchi or rales are noted at this time . Abdomen: soft . Skin: no rash seen on limited exam . Musculoskeletal: not rigid . Psychiatric:unable to assess . Neurologic: no seizure no involuntary movements         Lab Data:   Basic Metabolic Panel: Recent Labs  Lab 12/16/17 0727 12/18/17 0556 12/21/17 0630  NA 143 140 137  K 4.5 3.9 3.9  CL 111 102 98  CO2 26 29 33*  GLUCOSE 230* 236* 289*  BUN 12 12 16   CREATININE 0.49* 0.44* 0.41*  CALCIUM 9.5 9.4 9.5  MG  --  1.9  --     Liver Function Tests: Recent Labs  Lab 12/18/17 0556  AST 17  ALT 19  ALKPHOS 59  BILITOT 0.9  PROT 6.1*  ALBUMIN 2.2*   No results for input(s): LIPASE, AMYLASE in the last 168 hours. No results for input(s): AMMONIA in the last 168 hours.  CBC: Recent Labs  Lab 12/21/2017 1839 12/16/17 0727 12/17/17 0704 12/18/17 0556 12/19/17 0826 12/21/17 0630  WBC 10.1 13.0*  --  12.1* 12.3* 14.1*  NEUTROABS  --  9.7*   --   --   --   --   HGB 10.5* 10.8* 9.2* 10.5* 10.9* 11.3*  HCT 35.4* 36.5* 31.2* 35.1* 35.8* 37.6*  MCV 91.5 91.5  --  89.8 89.9 90.0  PLT 286 344  --  301 302 385    Cardiac Enzymes: No results for input(s): CKTOTAL, CKMB, CKMBINDEX, TROPONINI in the last 168 hours.  BNP (last 3 results) Recent Labs    11/22/17 0116  BNP 170.4*    ProBNP (last 3 results) No results for input(s): PROBNP in the last 8760 hours.  Radiological Exams: Dg Chest Port 1 View  Result Date: 12/19/2017 CLINICAL DATA:  Respiratory distress EXAM: PORTABLE CHEST 1 VIEW COMPARISON:  The twenty-fourth 2019 FINDINGS: Stable tracheostomy tube. The right PICC line is been removed. No pneumothorax. Increasing diffuse interstitial opacities and haziness over the bases. Cardiomegaly. No other change. IMPRESSION: Findings are consistent with worsening pulmonary edema and probable small layering effusions, left greater than right. Electronically Signed   By: Dorise Bullion III M.D   On: 12/19/2017 17:02    Assessment/Plan Active Problems:   OSA (obstructive sleep apnea)   PAF (paroxysmal atrial fibrillation) ([place])   Basilar artery occlusion   Primary papillary adenocarcinoma of thyroid gland ([place])   HCAP (healthcare-associated pneumonia)   Severe sepsis ([place])   CHF (congestive heart failure) (HCC)   Acute on chronic  respiratory failure with hypoxia ([place])   1. Acute on chronic respiratory failure with hypoxia patient remains on the ventilator and full support this morning was switched over to pressure support to see if able to tolerate.  Currently is on 50% FiO2 with a pressure support of 12 PEEP 5.  Will monitor very closely and cautiously. 2. Paroxysmal atrial fibrillation rate is controlled we will continue to follow along 3. Congestive heart failure last chest x-ray showed some worsening of pulmonary edema diuresis tolerated. 4. Healthcare associated pneumonia treated with antibiotics we will continue to  follow 5. 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 Stroke following basilar artery occlusion continue with supportive care restorative therapy patient has not been able to do anything as far as therapy with physical therapy. 7. Severe sepsis patient is hemodynamically stable at this time we will continue to follow along.   I have personally seen and evaluated the patient, evaluated laboratory and imaging results, formulated the assessment and plan and placed orders. The Patient requires high complexity decision making for assessment and support.  Case was discussed on Rounds with the Respiratory Therapy Staff patient is critically ill in danger of cardiac arrest and has a high risk airway.  Patient requires high complexity monitoring with advanced respiratory monitoring devices.  Time spent 35 minutes  Allyne Gee, MD [facility] Pulmonary Critical Care Medicine Sleep Medicine

## 2017-12-22 ENCOUNTER — Other Ambulatory Visit (HOSPITAL_COMMUNITY): Payer: Self-pay

## 2017-12-22 LAB — URINALYSIS, ROUTINE W REFLEX MICROSCOPIC
BACTERIA UA: NONE SEEN
Bilirubin Urine: NEGATIVE
Glucose, UA: 50 mg/dL — AB
Hgb urine dipstick: NEGATIVE
Ketones, ur: NEGATIVE mg/dL
Leukocytes, UA: NEGATIVE
Nitrite: NEGATIVE
PROTEIN: 30 mg/dL — AB
Specific Gravity, Urine: 1.017 (ref 1.005–1.030)
pH: 5 (ref 5.0–8.0)

## 2017-12-22 LAB — PROTIME-INR
INR: 2.29
Prothrombin Time: 25.1 seconds — ABNORMAL HIGH (ref 11.4–15.2)

## 2017-12-22 NOTE — Progress Notes (Signed)
Pulmonary [place]   PULMONARY SERVICE  PROGRESS NOTE  Date of Service: 12/22/2017  [name] [name]  ALP:[id number]  DOB: 15-Aug-1955   DOA: 11/25/2017  Referring Physician: Merton Border, MD  HPI: [name] [name] is a 62 y.o. male seen for follow up of Acute on Chronic Respiratory Failure.  Patient is not doing well overnight patient had to have the oxygen increased currently is on 80% FiO2.  Secretions are fair to moderate.  Overall diagnosis as previously noted is poor.  The patient's also been requiring increasing PEEP  Medications: Reviewed on Rounds  Physical Exam:  Vitals: Temperature 102.5 pulse 112 respiratory rate 2034 blood pressure 148/77 saturations 94%  Ventilator Settings mode of ventilation is pressure assist control FiO2 80% tidal volume 364 PEEP 7  . General: Comfortable at this time . Eyes: Grossly normal lids, irises & conjunctiva . ENT: grossly tongue is normal . Neck: no obvious mass . Cardiovascular: S1 S2 normal no gallop . Respiratory: No rhonchi or rales are noted at this time. . Abdomen: soft . Skin: no rash seen on limited exam . Musculoskeletal: not rigid . Psychiatric:unable to assess . Neurologic: no seizure no involuntary movements         Lab Data:   Basic Metabolic Panel: Recent Labs  Lab 12/16/17 0727 12/18/17 0556 12/21/17 0630  NA 143 140 137  K 4.5 3.9 3.9  CL 111 102 98  CO2 26 29 33*  GLUCOSE 230* 236* 289*  BUN 12 12 16   CREATININE 0.49* 0.44* 0.41*  CALCIUM 9.5 9.4 9.5  MG  --  1.9  --     Liver Function Tests: Recent Labs  Lab 12/18/17 0556  AST 17  ALT 19  ALKPHOS 59  BILITOT 0.9  PROT 6.1*  ALBUMIN 2.2*   No results for input(s): LIPASE, AMYLASE in the last 168 hours. No results for input(s): AMMONIA in the last 168 hours.  CBC: Recent Labs  Lab 12/19/2017 1839 12/16/17 0727 12/17/17 0704 12/18/17 0556 12/19/17 0826 12/21/17 0630  WBC 10.1 13.0*  --  12.1*  12.3* 14.1*  NEUTROABS  --  9.7*  --   --   --   --   HGB 10.5* 10.8* 9.2* 10.5* 10.9* 11.3*  HCT 35.4* 36.5* 31.2* 35.1* 35.8* 37.6*  MCV 91.5 91.5  --  89.8 89.9 90.0  PLT 286 344  --  301 302 385    Cardiac Enzymes: No results for input(s): CKTOTAL, CKMB, CKMBINDEX, TROPONINI in the last 168 hours.  BNP (last 3 results) Recent Labs    11/22/17 0116  BNP 170.4*    ProBNP (last 3 results) No results for input(s): PROBNP in the last 8760 hours.  Radiological Exams: No results found.  Assessment/Plan Active Problems:   OSA (obstructive sleep apnea)   PAF (paroxysmal atrial fibrillation) (HCC)   Basilar artery occlusion   Primary papillary adenocarcinoma of thyroid gland (HCC)   HCAP (healthcare-associated pneumonia)   Severe sepsis (HCC)   CHF (congestive heart failure) (HCC)   Acute on chronic respiratory failure with hypoxia ([place])   1. Acute on chronic respiratory failure with hypoxia we will continue with full vent support.  I am concerned with the increase in his oxygen and obviously this portends a poor prognosis.  We have been trying to achieve a goals of care with the family I will discuss with case manager further 2. Paroxysmal atrial fibrillation rate is controlled at this time. 3. Primary papillary adenocarcinoma  metastatic we will continue with supportive care prognosis poor 4. Healthcare associated pneumonia treated 5. Severe sepsis we will continue present management 6. Congestive heart failure at baseline monitor fluid status   I have personally seen and evaluated the patient, evaluated laboratory and imaging results, formulated the assessment and plan and placed orders. The Patient requires high complexity decision making for assessment and support.  Case was discussed on Rounds with the Respiratory Therapy Staff time 35 minutes patient is critically ill in danger of cardiac arrest has had worsening overnight with increased oxygen requirements and increased  ventilatory support requirements  Allyne Gee, MD [facility] Pulmonary Critical Care Medicine Sleep Medicine

## 2017-12-23 LAB — CBC
HCT: 39.8 % (ref 39.0–52.0)
Hemoglobin: 11.8 g/dL — ABNORMAL LOW (ref 13.0–17.0)
MCH: 26.9 pg (ref 26.0–34.0)
MCHC: 29.6 g/dL — ABNORMAL LOW (ref 30.0–36.0)
MCV: 90.7 fL (ref 78.0–100.0)
Platelets: 302 10*3/uL (ref 150–400)
RBC: 4.39 MIL/uL (ref 4.22–5.81)
RDW: 16.8 % — ABNORMAL HIGH (ref 11.5–15.5)
WBC: 11.5 10*3/uL — ABNORMAL HIGH (ref 4.0–10.5)

## 2017-12-23 LAB — URINE CULTURE: Culture: NO GROWTH

## 2017-12-23 LAB — PROTIME-INR
INR: 2.06
PROTHROMBIN TIME: 23.1 s — AB (ref 11.4–15.2)

## 2017-12-23 NOTE — Progress Notes (Signed)
[name] [name]   [name] SERVICE  PROGRESS NOTE  Date of Service: 12/23/2017  [name] [name]  OFB:[id number]  DOB: 1955-09-24   DOA: 12/09/2017  Referring Physician: Merton Border, MD  HPI: [name] [name] is a 62 y.o. male seen for follow up of Acute on Chronic Respiratory Failure.  Patient is on assist control mode currently is on pressure assist control mode requiring 80% oxygen.  Remains critically ill has not been able to tolerate much in the way of weaning the oxygen.  Right now is on a PEEP of 7.  I spoke with respiratory therapy and it explained that we can probably go up on the PEEP and try to decrease the FiO2 if at all possible  Medications: Reviewed on Rounds  Physical Exam:  Vitals: Temperature 101.4 pulse 117 respiratory rate 26 blood pressure 138/76 saturations 98%  Ventilator Settings mode of ventilation pressure assist control FiO2 80% tidal volume 442 PEEP 7  . General: Comfortable at this time . Eyes: Grossly normal lids, irises & conjunctiva . ENT: grossly tongue is normal . Neck: no obvious mass . Cardiovascular: S1 S2 normal no gallop . Respiratory: Scattered rhonchi no rales are noted . Abdomen: soft . Skin: no rash seen on limited exam . Musculoskeletal: not rigid . Psychiatric:unable to assess . Neurologic: no seizure no involuntary movements         Lab Data:   Basic Metabolic Panel: Recent Labs  Lab 12/18/17 0556 12/21/17 0630  NA 140 137  K 3.9 3.9  CL 102 98  CO2 29 33*  GLUCOSE 236* 289*  BUN 12 16  CREATININE 0.44* 0.41*  CALCIUM 9.4 9.5  MG 1.9  --     Liver Function Tests: Recent Labs  Lab 12/18/17 0556  AST 17  ALT 19  ALKPHOS 59  BILITOT 0.9  PROT 6.1*  ALBUMIN 2.2*   No results for input(s): LIPASE, AMYLASE in the last 168 hours. No results for input(s): AMMONIA in the last 168 hours.  CBC: Recent Labs  Lab 12/17/17 0704 12/18/17 0556 12/19/17 0826 12/21/17 0630  12/23/17 0441  WBC  --  12.1* 12.3* 14.1* 11.5*  HGB 9.2* 10.5* 10.9* 11.3* 11.8*  HCT 31.2* 35.1* 35.8* 37.6* 39.8  MCV  --  89.8 89.9 90.0 90.7  PLT  --  301 302 385 302    Cardiac Enzymes: No results for input(s): CKTOTAL, CKMB, CKMBINDEX, TROPONINI in the last 168 hours.  BNP (last 3 results) Recent Labs    11/22/17 0116  BNP 170.4*    ProBNP (last 3 results) No results for input(s): PROBNP in the last 8760 hours.  Radiological Exams: Dg Chest Port 1 View  Result Date: 12/22/2017 CLINICAL DATA:  Fever to 102 degrees. Increased sputum production. Systemic inflammatory response syndrome. EXAM: PORTABLE CHEST 1 VIEW COMPARISON:  Chest x-ray of December 19, 2017 FINDINGS: The lungs are well-expanded. The interstitial markings remain increased. There are bilateral pleural effusions layering posteriorly. The cardiac silhouette is enlarged. The [name] vascularity is engorged. The tracheostomy tube tip projects at the superior margin of the clavicular heads. IMPRESSION: CHF with interstitial edema and bilateral pleural effusions slightly more conspicuous today. One cannot exclude bibasilar pneumonia. Electronically Signed   By: David  [place] M.D.   On: 12/22/2017 13:05    Assessment/Plan Active Problems:   OSA (obstructive sleep apnea)   PAF (paroxysmal atrial fibrillation) ([place])   Basilar artery occlusion   Primary papillary adenocarcinoma of thyroid gland ([place])  HCAP (healthcare-associated pneumonia)   Severe sepsis (HCC)   CHF (congestive heart failure) (HCC)   Acute on chronic respiratory failure with hypoxia ([place])   1. Acute on chronic respiratory failure with hypoxia we will continue with trying to wean the FiO2 down.  Patient's not tolerating very well as noted above with increased PEEP if the need arises. 2. Paroxysmal atrial fibrillation rate is controlled we will continue with present management. 3. Primary papillary adenocarcinoma metastatic disease prognosis  guarded 4. Healthcare associated pneumonia treated chest x-ray still showing some abnormalities consistent with pneumonitis or failure. 5. Congestive heart failure as above chest x-ray showed some worsening. 6. Severe sepsis treated with antibiotics now spiked a fever will discuss with primary care regarding with temperature. 7. Stroke due to basilar artery occlusion continue with restorative therapy patient is locked and not really interactive  Patient is critically ill in danger of cardiac arrest time spent 35 minutes reviewing the patient's chart and discussion on rounds with the primary care team  I have personally seen and evaluated the patient, evaluated laboratory and imaging results, formulated the assessment and plan and placed orders. The Patient requires high complexity decision making for assessment and support.  Case was discussed on Rounds with the Respiratory Therapy Staff  Allyne Gee, MD [name] Critical Care Medicine Sleep Medicine

## 2017-12-23 DEATH — deceased

## 2017-12-24 ENCOUNTER — Other Ambulatory Visit (HOSPITAL_COMMUNITY): Payer: Self-pay

## 2017-12-24 DIAGNOSIS — J90 Pleural effusion, not elsewhere classified: Secondary | ICD-10-CM

## 2017-12-24 LAB — CULTURE, RESPIRATORY

## 2017-12-24 LAB — PROTIME-INR
INR: 1.85
PROTHROMBIN TIME: 21.2 s — AB (ref 11.4–15.2)

## 2017-12-24 LAB — CULTURE, RESPIRATORY W GRAM STAIN

## 2017-12-24 NOTE — Progress Notes (Signed)
Request for therapeutic thoracentesis s/p CXR today with questionable left atelectasis vs effusion. Limited chest US performed at bedside prior to procedure showing minimal amounts of pleural fluid present in left pleural space which is not amenable to drainage today given risk to patient. No procedure performed today, plan discussed with Dr. Harlow Mares.   Please re-consult IR if needed.  Candiss Norse, PA-C 12/23/17 1343

## 2017-12-24 NOTE — Progress Notes (Signed)
Pulmonary [place]   PULMONARY SERVICE  PROGRESS NOTE  Date of Service: 12/24/2017  [name] [name]  [id number]  DOB: [birth year]   DOA: 12/07/2017  Referring Physician: Merton Border, MD  HPI: [name] [name] is a 62 y.o. male seen for follow up of Acute on Chronic Respiratory Failure.  Patient is currently on 70% oxygen.  Chest x-ray showed now accumulation of fluid on the left side worsening pleural effusion.  Patient's PEEP currently is at 7 with been trying to increase the PEEP to try to improve his oxygenation.  Medications: Reviewed on Rounds  Physical Exam:  Vitals: Temperature 98.5 pulse 116 respiratory 26 blood pressure 141/70 saturations 93%  Ventilator Settings mode of ventilation pressure assist control FiO2 70% tidal volume 458 PEEP 7  . General: Comfortable at this time . Eyes: Grossly normal lids, irises & conjunctiva . ENT: grossly tongue is normal . Neck: no obvious mass . Cardiovascular: S1 S2 normal no gallop . Respiratory: Coarse rhonchi diminished breath sounds on the left side . Abdomen: soft . Skin: no rash seen on limited exam . Musculoskeletal: not rigid . Psychiatric:unable to assess . Neurologic: no seizure no involuntary movements         Lab Data:   Basic Metabolic Panel: Recent Labs  Lab 12/18/17 0556 12/21/17 0630  NA 140 137  K 3.9 3.9  CL 102 98  CO2 29 33*  GLUCOSE 236* 289*  BUN 12 16  CREATININE 0.44* 0.41*  CALCIUM 9.4 9.5  MG 1.9  --     Liver Function Tests: Recent Labs  Lab 12/18/17 0556  AST 17  ALT 19  ALKPHOS 59  BILITOT 0.9  PROT 6.1*  ALBUMIN 2.2*   No results for input(s): LIPASE, AMYLASE in the last 168 hours. No results for input(s): AMMONIA in the last 168 hours.  CBC: Recent Labs  Lab 12/18/17 0556 12/19/17 0826 12/21/17 0630 12/23/17 0441  WBC 12.1* 12.3* 14.1* 11.5*  HGB 10.5* 10.9* 11.3* 11.8*  HCT 35.1* 35.8* 37.6* 39.8  MCV 89.8 89.9 90.0 90.7   PLT 301 302 385 302    Cardiac Enzymes: No results for input(s): CKTOTAL, CKMB, CKMBINDEX, TROPONINI in the last 168 hours.  BNP (last 3 results) Recent Labs    11/22/17 0116  BNP 170.4*    ProBNP (last 3 results) No results for input(s): PROBNP in the last 8760 hours.  Radiological Exams: Dg Chest Port 1 View  Result Date: 12/24/2017 CLINICAL DATA:  Acute respiratory failure, history type II diabetes mellitus, hypertension, collagen vascular disease EXAM: PORTABLE CHEST 1 VIEW COMPARISON:  Portable exam 0711 hours compared to 12/22/2017 FINDINGS: Tracheostomy tube stable. Near complete opacification of the LEFT hemithorax new since previous exam likely due to a combination of atelectasis and effusion. Underlying abnormalities in the LEFT lower lobe not excluded in this setting. Mild diffuse infiltrates RIGHT lung, slightly improved. No pneumothorax. IMPRESSION: Increased LEFT pleural effusion and atelectasis with near complete opacification of the LEFT hemithorax since previous exam. Improved RIGHT pulmonary infiltrates. Electronically Signed   By: Lavonia Dana M.D.   On: 12/24/2017 08:31   Dg Chest Port 1 View  Result Date: 12/22/2017 CLINICAL DATA:  Fever to 102 degrees. Increased sputum production. Systemic inflammatory response syndrome. EXAM: PORTABLE CHEST 1 VIEW COMPARISON:  Chest x-ray of December 19, 2017 FINDINGS: The lungs are well-expanded. The interstitial markings remain increased. There are bilateral pleural effusions layering posteriorly. The cardiac silhouette is enlarged. The pulmonary vascularity  is engorged. The tracheostomy tube tip projects at the superior margin of the clavicular heads. IMPRESSION: CHF with interstitial edema and bilateral pleural effusions slightly more conspicuous today. One cannot exclude bibasilar pneumonia. Electronically Signed   By: David  Martinique M.D.   On: 12/22/2017 13:05    Assessment/Plan Active Problems:   OSA (obstructive sleep apnea)    PAF (paroxysmal atrial fibrillation) (HCC)   Basilar artery occlusion   Primary papillary adenocarcinoma of thyroid gland (HCC)   HCAP (healthcare-associated pneumonia)   Severe sepsis (HCC)   CHF (congestive heart failure) (HCC)   Acute on chronic respiratory failure with hypoxia ([place])   1. Acute on chronic respiratory failure with hypoxia patient has worsening chest x-ray I think is more consistent with fluid on the left side.  Spoke with the primary care team will try to schedule him for thoracentesis to drain the fluid.  This likely explains her worsening of his oxygenation.  Concern here obviously is that this fluid could represent malignant effusion.  I had spoken with his significant other who was present in the room and had updated her. 2. Healthcare associated pneumonia treated with antibiotics worsening effusion on the left side worrisome for malignant effusion versus empyema we will get a thoracentesis done. 3. Severe sepsis hemodynamically stable at this time.  However patient remains critically ill and is in danger of hemodynamic compromise 4. Congestive heart failure we will continue with supportive care diuresis tolerated. 5. 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 6. Paroxysmal atrial fibrillation rate is controlled continue with present management. 7. OSA currently is not an issue   I have personally seen and evaluated the patient, evaluated laboratory and imaging results, formulated the assessment and plan and placed orders. The Patient requires high complexity decision making for assessment and support.  Case was discussed on Rounds with the Respiratory Therapy Staff patient is critically ill in danger of cardiac arrest requiring advanced monitoring techniques and closer nursing supervision.  Time spent 35 minutes discussion with respiratory staff as well as with the primary care team and review of the medical record.  Allyne Gee, MD  [facility] Pulmonary Critical Care Medicine Sleep Medicine

## 2017-12-25 LAB — CBC
HEMATOCRIT: 39.1 % (ref 39.0–52.0)
Hemoglobin: 11.6 g/dL — ABNORMAL LOW (ref 13.0–17.0)
MCH: 26.6 pg (ref 26.0–34.0)
MCHC: 29.7 g/dL — AB (ref 30.0–36.0)
MCV: 89.7 fL (ref 78.0–100.0)
Platelets: 332 10*3/uL (ref 150–400)
RBC: 4.36 MIL/uL (ref 4.22–5.81)
RDW: 16.4 % — AB (ref 11.5–15.5)
WBC: 10.6 10*3/uL — ABNORMAL HIGH (ref 4.0–10.5)

## 2017-12-25 LAB — PROTIME-INR
INR: 1.93
Prothrombin Time: 21.9 seconds — ABNORMAL HIGH (ref 11.4–15.2)

## 2017-12-25 NOTE — Progress Notes (Signed)
Pulmonary [place]   PULMONARY SERVICE  PROGRESS NOTE  Date of Service: 12/25/2017  [name] [name]  IEP:[id number]  DOB: 1956-05-01   DOA: 12/14/2017  Referring Physician: Merton Border, MD  HPI: [name] [name] is a 62 y.o. male seen for follow up of Acute on Chronic Respiratory Failure.  Remains unchanged remains on the ventilator full support.  Patient's unresponsive has been on 60% oxygen which is being gradually weaned  Medications: Reviewed on Rounds  Physical Exam:  Vitals: Temperature 98.1 pulse 111 respiratory 25 blood pressure 142/78 saturations 98%  Ventilator Settings mode of ventilation pressure assist control FiO2 60% tidal volume 588 PEEP 7  . General: Comfortable at this time . Eyes: Grossly normal lids, irises & conjunctiva . ENT: grossly tongue is normal . Neck: no obvious mass . Cardiovascular: S1 S2 normal no gallop . Respiratory: No rhonchi or rales are noted . Abdomen: soft . Skin: no rash seen on limited exam . Musculoskeletal: not rigid . Psychiatric:unable to assess . Neurologic: no seizure no involuntary movements         Lab Data:   Basic Metabolic Panel: Recent Labs  Lab 12/21/17 0630  NA 137  K 3.9  CL 98  CO2 33*  GLUCOSE 289*  BUN 16  CREATININE 0.41*  CALCIUM 9.5    Liver Function Tests: No results for input(s): AST, ALT, ALKPHOS, BILITOT, PROT, ALBUMIN in the last 168 hours. No results for input(s): LIPASE, AMYLASE in the last 168 hours. No results for input(s): AMMONIA in the last 168 hours.  CBC: Recent Labs  Lab 12/19/17 0826 12/21/17 0630 12/23/17 0441 12/25/17 0623  WBC 12.3* 14.1* 11.5* 10.6*  HGB 10.9* 11.3* 11.8* 11.6*  HCT 35.8* 37.6* 39.8 39.1  MCV 89.9 90.0 90.7 89.7  PLT 302 385 302 332    Cardiac Enzymes: No results for input(s): CKTOTAL, CKMB, CKMBINDEX, TROPONINI in the last 168 hours.  BNP (last 3 results) Recent Labs    11/22/17 0116  BNP 170.4*     ProBNP (last 3 results) No results for input(s): PROBNP in the last 8760 hours.  Radiological Exams: [place] Chest (pleural Effusion)  Result Date: 12/24/2017 CLINICAL DATA:  62 year old male with a left pleural effusion who presents for attempted thoracentesis EXAM: CHEST ULTRASOUND COMPARISON:  Chest x-ray 12/24/2017 FINDINGS: Sonographic interrogation of the left chest demonstrates only trace pleural fluid. Thoracentesis was deferred. IMPRESSION: Trace left pleural effusion, insufficient for thoracentesis. The white out of the left chest on the radiograph from earlier today likely represents a combination of atelectasis and/or consolidation. Electronically Signed   By: Jacqulynn Cadet M.D.   On: 12/24/2017 16:57   Dg Chest Port 1 View  Result Date: 12/24/2017 CLINICAL DATA:  Acute respiratory failure, history type II diabetes mellitus, hypertension, collagen vascular disease EXAM: PORTABLE CHEST 1 VIEW COMPARISON:  Portable exam 0711 hours compared to 12/22/2017 FINDINGS: Tracheostomy tube stable. Near complete opacification of the LEFT hemithorax new since previous exam likely due to a combination of atelectasis and effusion. Underlying abnormalities in the LEFT lower lobe not excluded in this setting. Mild diffuse infiltrates RIGHT lung, slightly improved. No pneumothorax. IMPRESSION: Increased LEFT pleural effusion and atelectasis with near complete opacification of the LEFT hemithorax since previous exam. Improved RIGHT pulmonary infiltrates. Electronically Signed   By: Lavonia Dana M.D.   On: 12/24/2017 08:31    Assessment/Plan Active Problems:   OSA (obstructive sleep apnea)   PAF (paroxysmal atrial fibrillation) ([place])   Basilar  artery occlusion   Primary papillary adenocarcinoma of thyroid gland (HCC)   HCAP (healthcare-associated pneumonia)   Severe sepsis (HCC)   CHF (congestive heart failure) (HCC)   Acute on chronic respiratory failure with hypoxia ([place])   1. Acute on chronic  respiratory failure with hypoxia doing fine with pressure assist control will continue with trying to titrate the oxygen down continue pulmonary toilet supportive care. 2. Paroxysmal atrial fibrillation rate is controlled we will follow 3. 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 Papillary adenocarcinoma metastatic we will continue with supportive care 5. Healthcare associated pneumonia treated we will monitor 6. Severe sepsis hemodynamically stable we will continue to monitor 7. CHF continue present therapy   I have personally seen and evaluated the patient, evaluated laboratory and imaging results, formulated the assessment and plan and placed orders. The Patient requires high complexity decision making for assessment and support.  Case was discussed on Rounds with the Respiratory Therapy Staff  Allyne Gee, MD [facility] Pulmonary Critical Care Medicine Sleep Medicine

## 2017-12-26 ENCOUNTER — Other Ambulatory Visit (HOSPITAL_COMMUNITY): Payer: Self-pay

## 2017-12-26 LAB — BASIC METABOLIC PANEL
Anion gap: 12 (ref 5–15)
BUN: 26 mg/dL — AB (ref 8–23)
CHLORIDE: 91 mmol/L — AB (ref 98–111)
CO2: 34 mmol/L — ABNORMAL HIGH (ref 22–32)
Calcium: 9.7 mg/dL (ref 8.9–10.3)
Creatinine, Ser: 0.49 mg/dL — ABNORMAL LOW (ref 0.61–1.24)
GFR calc Af Amer: >60 mL/min (ref >60)
GFR calc non Af Amer: >60 mL/min (ref >60)
GLUCOSE: 204 mg/dL — AB (ref 70–99)
POTASSIUM: 4.1 mmol/L (ref 3.5–5.1)
Sodium: 137 mmol/L (ref 135–145)

## 2017-12-26 LAB — PROTIME-INR
INR: 2.11
Prothrombin Time: 23.5 seconds — ABNORMAL HIGH (ref 11.4–15.2)

## 2017-12-26 NOTE — Progress Notes (Signed)
Pulmonary [place]   PULMONARY SERVICE  PROGRESS NOTE  Date of Service: 12/26/2017  [name] [name]  [id number]:[id number]  DOB: 12/11/1955   DOA: 12/13/2017  Referring Physician: Merton Border, MD  HPI: [name] [name] is a 62 y.o. male seen for follow up of Acute on Chronic Respiratory Failure.  Comfortable without distress at this time.  Patient is on pressure assist control mode 60% oxygen at this time  Medications: Reviewed on Rounds  Physical Exam:  Vitals: Temperature 99.0 pulse 90 respiratory rate 24 blood pressure 127/69 saturations 96%  Ventilator Settings mode of ventilation pressure assist control FiO2 60% tidal volume 448 PEEP 7  . General: Comfortable at this time . Eyes: Grossly normal lids, irises & conjunctiva . ENT: grossly tongue is normal . Neck: no obvious mass . Cardiovascular: S1 S2 normal no gallop . Respiratory: No rhonchi or rales are noted at this time . Abdomen: soft . Skin: no rash seen on limited exam . Musculoskeletal: not rigid . Psychiatric:unable to assess . Neurologic: no seizure no involuntary movements         Lab Data:   Basic Metabolic Panel: Recent Labs  Lab 12/21/17 0630 12/26/17 0610  NA 137 137  K 3.9 4.1  CL 98 91*  CO2 33* 34*  GLUCOSE 289* 204*  BUN 16 26*  CREATININE 0.41* 0.49*  CALCIUM 9.5 9.7    Liver Function Tests: No results for input(s): AST, ALT, ALKPHOS, BILITOT, PROT, ALBUMIN in the last 168 hours. No results for input(s): LIPASE, AMYLASE in the last 168 hours. No results for input(s): AMMONIA in the last 168 hours.  CBC: Recent Labs  Lab 12/21/17 0630 12/23/17 0441 12/25/17 0623  WBC 14.1* 11.5* 10.6*  HGB 11.3* 11.8* 11.6*  HCT 37.6* 39.8 39.1  MCV 90.0 90.7 89.7  PLT 385 302 332    Cardiac Enzymes: No results for input(s): CKTOTAL, CKMB, CKMBINDEX, TROPONINI in the last 168 hours.  BNP (last 3 results) Recent Labs    11/22/17 0116  BNP 170.4*     ProBNP (last 3 results) No results for input(s): PROBNP in the last 8760 hours.  Radiological Exams: [place] Chest (pleural Effusion)  Result Date: 12/24/2017 CLINICAL DATA:  62 year old male with a left pleural effusion who presents for attempted thoracentesis EXAM: CHEST ULTRASOUND COMPARISON:  Chest x-ray 12/24/2017 FINDINGS: Sonographic interrogation of the left chest demonstrates only trace pleural fluid. Thoracentesis was deferred. IMPRESSION: Trace left pleural effusion, insufficient for thoracentesis. The white out of the left chest on the radiograph from earlier today likely represents a combination of atelectasis and/or consolidation. Electronically Signed   By: Jacqulynn Cadet M.D.   On: 12/24/2017 16:57   Dg Chest Port 1 View  Result Date: 12/26/2017 CLINICAL DATA:  Acute respiratory failure. EXAM: PORTABLE CHEST 1 VIEW COMPARISON:  12/24/2017 FINDINGS: Improved aeration of left lung is seen since previous study. Mild cardiomegaly again noted. Diffuse interstitial infiltrates are suspicious for interstitial edema. Small layering right pleural effusion is seen. Tracheostomy tube remains in place. IMPRESSION: Improved aeration of left lung. Cardiomegaly, pulmonary interstitial edema, and small right pleural effusion. Electronically Signed   By: Earle Gell M.D.   On: 12/26/2017 08:00    Assessment/Plan Active Problems:   OSA (obstructive sleep apnea)   PAF (paroxysmal atrial fibrillation) (HCC)   Basilar artery occlusion   Primary papillary adenocarcinoma of thyroid gland (HCC)   HCAP (healthcare-associated pneumonia)   Severe sepsis (HCC)   CHF (congestive heart failure) (  [place])   Acute on chronic respiratory failure with hypoxia ([place])   1. Acute on chronic respiratory failure with hypoxia patient continues on full vent support the oxygen is being weaned down right now decreased down to 60%.  PEEP is at 7 2. Paroxysmal atrial fibrillation rate is controlled we will continue to  follow 3. Basilar artery occlusion stroke at baseline we will continue to monitor 4. Primary papillary adenocarcinoma of the thyroid we will continue with supportive care patient prognosis poor 5. Healthcare associated pneumonia treated 6. Severe sepsis hemodynamically stable at this time. 7. CHF last chest x-ray was showing some improved aeration of the lungs with interstitial edema. 8. Morbid obesity at baseline   I have personally seen and evaluated the patient, evaluated laboratory and imaging results, formulated the assessment and plan and placed orders. The Patient requires high complexity decision making for assessment and support.  Case was discussed on Rounds with the Respiratory Therapy Staff  Allyne Gee, MD [facility] Pulmonary Critical Care Medicine Sleep Medicine

## 2017-12-27 LAB — BASIC METABOLIC PANEL
Anion gap: 9 (ref 5–15)
BUN: 24 mg/dL — AB (ref 8–23)
CALCIUM: 9.6 mg/dL (ref 8.9–10.3)
CO2: 34 mmol/L — AB (ref 22–32)
CREATININE: 0.48 mg/dL — AB (ref 0.61–1.24)
Chloride: 95 mmol/L — ABNORMAL LOW (ref 98–111)
GFR calc Af Amer: >60 mL/min (ref >60)
GFR calc non Af Amer: >60 mL/min (ref >60)
GLUCOSE: 250 mg/dL — AB (ref 70–99)
Potassium: 3.9 mmol/L (ref 3.5–5.1)
Sodium: 138 mmol/L (ref 135–145)

## 2017-12-27 LAB — CBC
HEMATOCRIT: 36.2 % — AB (ref 39.0–52.0)
Hemoglobin: 11 g/dL — ABNORMAL LOW (ref 13.0–17.0)
MCH: 27.1 pg (ref 26.0–34.0)
MCHC: 30.4 g/dL (ref 30.0–36.0)
MCV: 89.2 fL (ref 78.0–100.0)
PLATELETS: 263 10*3/uL (ref 150–400)
RBC: 4.06 MIL/uL — ABNORMAL LOW (ref 4.22–5.81)
RDW: 16.2 % — ABNORMAL HIGH (ref 11.5–15.5)
WBC: 8.2 10*3/uL (ref 4.0–10.5)

## 2017-12-27 LAB — CULTURE, BLOOD (ROUTINE X 2)
CULTURE: NO GROWTH
Culture: NO GROWTH
Special Requests: ADEQUATE
Special Requests: ADEQUATE

## 2017-12-27 LAB — PROTIME-INR
INR: 1.9
Prothrombin Time: 21.6 seconds — ABNORMAL HIGH (ref 11.4–15.2)

## 2017-12-27 NOTE — Progress Notes (Signed)
Pulmonary [place]   PULMONARY SERVICE  PROGRESS NOTE  Date of Service: 12/27/2017  [name] [name]  OXB:[id number]  DOB: Nov 01, 1955   DOA: 12/08/2017  Referring Physician: Merton Border, MD  HPI: [name] [name] is a 62 y.o. male seen for follow up of Acute on Chronic Respiratory Failure.  Patient remains on full support right now has been on pressure assist control mode try to have several conversations about goals of care with the family however the family is continuing to basically wanting to press ahead.  He is not been doing well as far as weaning his concern right now is on pressure assist control mode and requiring 60% oxygen.  Medications: Reviewed on Rounds  Physical Exam:  Vitals: Temperature 100.0 pulse 81 respiratory rate 25 blood pressure 140/81 saturations 98%  Ventilator Settings mode of ventilation pressure assist control FiO2 60% tidal volume 520 driving pressure 18 PEEP 7  . General: Comfortable at this time . Eyes: Grossly normal lids, irises & conjunctiva . ENT: grossly tongue is normal . Neck: no obvious mass . Cardiovascular: S1 S2 normal no gallop . Respiratory: No rhonchi are noted at this time . Abdomen: soft . Skin: no rash seen on limited exam . Musculoskeletal: not rigid . Psychiatric:unable to assess . Neurologic: no seizure no involuntary movements         Lab Data:   Basic Metabolic Panel: Recent Labs  Lab 12/21/17 0630 12/26/17 0610 12/27/17 0619  NA 137 137 138  K 3.9 4.1 3.9  CL 98 91* 95*  CO2 33* 34* 34*  GLUCOSE 289* 204* 250*  BUN 16 26* 24*  CREATININE 0.41* 0.49* 0.48*  CALCIUM 9.5 9.7 9.6    Liver Function Tests: No results for input(s): AST, ALT, ALKPHOS, BILITOT, PROT, ALBUMIN in the last 168 hours. No results for input(s): LIPASE, AMYLASE in the last 168 hours. No results for input(s): AMMONIA in the last 168 hours.  CBC: Recent Labs  Lab 12/21/17 0630 12/23/17 0441  12/25/17 0623 12/27/17 0619  WBC 14.1* 11.5* 10.6* 8.2  HGB 11.3* 11.8* 11.6* 11.0*  HCT 37.6* 39.8 39.1 36.2*  MCV 90.0 90.7 89.7 89.2  PLT 385 302 332 263    Cardiac Enzymes: No results for input(s): CKTOTAL, CKMB, CKMBINDEX, TROPONINI in the last 168 hours.  BNP (last 3 results) Recent Labs    11/22/17 0116  BNP 170.4*    ProBNP (last 3 results) No results for input(s): PROBNP in the last 8760 hours.  Radiological Exams: Dg Chest Port 1 View  Result Date: 12/26/2017 CLINICAL DATA:  Acute respiratory failure. EXAM: PORTABLE CHEST 1 VIEW COMPARISON:  12/24/2017 FINDINGS: Improved aeration of left lung is seen since previous study. Mild cardiomegaly again noted. Diffuse interstitial infiltrates are suspicious for interstitial edema. Small layering right pleural effusion is seen. Tracheostomy tube remains in place. IMPRESSION: Improved aeration of left lung. Cardiomegaly, pulmonary interstitial edema, and small right pleural effusion. Electronically Signed   By: Earle Gell M.D.   On: 12/26/2017 08:00    Assessment/Plan Active Problems:   OSA (obstructive sleep apnea)   PAF (paroxysmal atrial fibrillation) (HCC)   Basilar artery occlusion   Primary papillary adenocarcinoma of thyroid gland ([place])   HCAP (healthcare-associated pneumonia)   Severe sepsis (HCC)   CHF (congestive heart failure) (HCC)   Acute on chronic respiratory failure with hypoxia ([place])   1. Acute on chronic respiratory failure with hypoxia we will continue with attempting to wean on pressure  control.  Patient needs to wean on FiO2 first right now is on 60% oxygen we need to come down below 60%.  Patient is currently on a driving pressure of 18.  Ideally we need to have a goals of care meeting with the family and have them understand however this is been essentially futile at this time. 2. Paroxysmal atrial fibrillation rate is controlled we will continue to follow 3. 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 Papillary adenocarcinoma metastatic disease poor prognosis. 5. Healthcare associated pneumonia treated with antibiotics 6. Congestive heart failure seems to be compensated at this time overall prognosis is poor   I have personally seen and evaluated the patient, evaluated laboratory and imaging results, formulated the assessment and plan and placed orders. The Patient requires high complexity decision making for assessment and support.  Case was discussed on Rounds with the Respiratory Therapy Staff  Allyne Gee, MD [facility] Pulmonary Critical Care Medicine Sleep Medicine

## 2017-12-28 LAB — PROTIME-INR
INR: 1.93
Prothrombin Time: 21.9 seconds — ABNORMAL HIGH (ref 11.4–15.2)

## 2017-12-28 NOTE — Progress Notes (Signed)
Pulmonary [name]   PULMONARY SERVICE  PROGRESS NOTE  Date of Service: 12/28/2017  [name] [name]  [id number]:[id number]  DOB: [birth year]   DOA: 11/23/2017  Referring Physician: Merton Border, MD  HPI: [name] [name] is a 62 y.o. male seen for follow up of Acute on Chronic Respiratory Failure.  Patient is on pressure support today.  Patient's girlfriend was present in the room.  Apparently the sons and the daughters have met and they are going to discuss the decision whether to continue aggressive measures or to consider withdrawal of life support.  Once again the patient does not have a good prognosis and is not likely to survive long-term.  Patient today was on a pressure support weaning we are still been to try to wean him with a goal of about 12 hours.  Medications: Reviewed on Rounds  Physical Exam:  Vitals: Temperature 99.0 pulse 106 respiratory rate 37 blood pressure 156/87 saturations 94%  Ventilator Settings mode of ventilation is pressure support FiO2 40% tidal volume 449 pressure support 12 PEEP 5  . General: Comfortable at this time . Eyes: Grossly normal lids, irises & conjunctiva . ENT: grossly tongue is normal . Neck: no obvious mass . Cardiovascular: S1 S2 normal no gallop . Respiratory: No rhonchi or rales are noted at this time . Abdomen: soft . Skin: no rash seen on limited exam . Musculoskeletal: not rigid . Psychiatric:unable to assess . Neurologic: no seizure no involuntary movements         Lab Data:   Basic Metabolic Panel: Recent Labs  Lab 12/26/17 0610 12/27/17 0619  NA 137 138  K 4.1 3.9  CL 91* 95*  CO2 34* 34*  GLUCOSE 204* 250*  BUN 26* 24*  CREATININE 0.49* 0.48*  CALCIUM 9.7 9.6    Liver Function Tests: No results for input(s): AST, ALT, ALKPHOS, BILITOT, PROT, ALBUMIN in the last 168 hours. No results for input(s): LIPASE, AMYLASE in the last 168 hours. No results for input(s): AMMONIA in the  last 168 hours.  CBC: Recent Labs  Lab 12/23/17 0441 12/25/17 0623 12/27/17 0619  WBC 11.5* 10.6* 8.2  HGB 11.8* 11.6* 11.0*  HCT 39.8 39.1 36.2*  MCV 90.7 89.7 89.2  PLT 302 332 263    Cardiac Enzymes: No results for input(s): CKTOTAL, CKMB, CKMBINDEX, TROPONINI in the last 168 hours.  BNP (last 3 results) Recent Labs    11/22/17 0116  BNP 170.4*    ProBNP (last 3 results) No results for input(s): PROBNP in the last 8760 hours.  Radiological Exams: No results found.  Assessment/Plan Active Problems:   OSA (obstructive sleep apnea)   PAF (paroxysmal atrial fibrillation) (HCC)   Basilar artery occlusion   Primary papillary adenocarcinoma of thyroid gland (HCC)   HCAP (healthcare-associated pneumonia)   Severe sepsis (HCC)   CHF (congestive heart failure) (HCC)   Acute on chronic respiratory failure with hypoxia ([place])   1. Acute on chronic respiratory failure with hypoxia we will continue with pressure support wean as tolerated the goal is 12 hours as noted.  Continue secretion management pulmonary toilet. 2. Paroxysmal atrial fibrillation rate is controlled we will continue to follow 3. Primary papillary adenocarcinoma of the thyroid metastatic disease prognosis poor 4. Healthcare associated pneumonia treated 5. Severe sepsis hemodynamically is doing better. 6. Congestive heart failure diuresis tolerated as discussed on rounds.   I have personally seen and evaluated the patient, evaluated laboratory and imaging results, formulated the assessment and  plan and placed orders. The Patient requires high complexity decision making for assessment and support.  Case was discussed on Rounds with the Respiratory Therapy Staff  Allyne Gee, MD [facility] Pulmonary Critical Care Medicine Sleep Medicine

## 2017-12-29 ENCOUNTER — Ambulatory Visit (HOSPITAL_COMMUNITY): Payer: Managed Care, Other (non HMO)

## 2017-12-29 ENCOUNTER — Encounter (HOSPITAL_COMMUNITY): Payer: 59

## 2017-12-29 ENCOUNTER — Encounter: Payer: 59 | Admitting: Vascular Surgery

## 2017-12-29 LAB — BASIC METABOLIC PANEL
Anion gap: 12 (ref 5–15)
BUN: 20 mg/dL (ref 8–23)
CALCIUM: 9.5 mg/dL (ref 8.9–10.3)
CO2: 29 mmol/L (ref 22–32)
Chloride: 96 mmol/L — ABNORMAL LOW (ref 98–111)
Creatinine, Ser: 0.46 mg/dL — ABNORMAL LOW (ref 0.61–1.24)
Glucose, Bld: 211 mg/dL — ABNORMAL HIGH (ref 70–99)
Potassium: 3.7 mmol/L (ref 3.5–5.1)
SODIUM: 137 mmol/L (ref 135–145)

## 2017-12-29 LAB — CBC
HCT: 38.4 % — ABNORMAL LOW (ref 39.0–52.0)
Hemoglobin: 11.7 g/dL — ABNORMAL LOW (ref 13.0–17.0)
MCH: 26.8 pg (ref 26.0–34.0)
MCHC: 30.5 g/dL (ref 30.0–36.0)
MCV: 88.1 fL (ref 78.0–100.0)
PLATELETS: 311 10*3/uL (ref 150–400)
RBC: 4.36 MIL/uL (ref 4.22–5.81)
RDW: 16.4 % — AB (ref 11.5–15.5)
WBC: 11.7 10*3/uL — AB (ref 4.0–10.5)

## 2017-12-29 LAB — PROTIME-INR
INR: 1.94
Prothrombin Time: 22 seconds — ABNORMAL HIGH (ref 11.4–15.2)

## 2017-12-29 NOTE — Progress Notes (Signed)
Pulmonary [place]   PULMONARY SERVICE  PROGRESS NOTE  Date of Service: 12/29/2017  Tracy Kinner  GMW:[id number]  DOB: [birth year]   DOA: 11/22/2017  Referring Physician: Merton Border, MD  HPI: Jonovan Boedecker is a 62 y.o. male seen for follow up of Acute on Chronic Respiratory Failure.  Patient continues on full support not wean able.  Right now is on pressure support this morning to see how well he can tolerate.  Medications: Reviewed on Rounds  Physical Exam:  Vitals: Temperature 99.8 pulse 98 respiratory rate 24 blood pressure 146/72 saturations 96%  Ventilator Settings mode of ventilation pressure support FiO2 35% pressure support 12 PEEP 5  . General: Comfortable at this time . Eyes: Grossly normal lids, irises & conjunctiva . ENT: grossly tongue is normal . Neck: no obvious mass . Cardiovascular: S1 S2 normal no gallop . Respiratory: No rhonchi or rales are noted . Abdomen: soft . Skin: no rash seen on limited exam . Musculoskeletal: not rigid . Psychiatric:unable to assess . Neurologic: no seizure no involuntary movements         Lab Data:   Basic Metabolic Panel: Recent Labs  Lab 12/26/17 0610 12/27/17 0619 12/29/17 0559  NA 137 138 137  K 4.1 3.9 3.7  CL 91* 95* 96*  CO2 34* 34* 29  GLUCOSE 204* 250* 211*  BUN 26* 24* 20  CREATININE 0.49* 0.48* 0.46*  CALCIUM 9.7 9.6 9.5    Liver Function Tests: No results for input(s): AST, ALT, ALKPHOS, BILITOT, PROT, ALBUMIN in the last 168 hours. No results for input(s): LIPASE, AMYLASE in the last 168 hours. No results for input(s): AMMONIA in the last 168 hours.  CBC: Recent Labs  Lab 12/23/17 0441 12/25/17 0623 12/27/17 0619 12/29/17 0559  WBC 11.5* 10.6* 8.2 11.7*  HGB 11.8* 11.6* 11.0* 11.7*  HCT 39.8 39.1 36.2* 38.4*  MCV 90.7 89.7 89.2 88.1  PLT 302 332 263 311    Cardiac Enzymes: No results for input(s): CKTOTAL, CKMB, CKMBINDEX, TROPONINI in the  last 168 hours.  BNP (last 3 results) Recent Labs    11/22/17 0116  BNP 170.4*    ProBNP (last 3 results) No results for input(s): PROBNP in the last 8760 hours.  Radiological Exams: No results found.  Assessment/Plan Active Problems:   OSA (obstructive sleep apnea)   PAF (paroxysmal atrial fibrillation) (HCC)   Basilar artery occlusion   Primary papillary adenocarcinoma of thyroid gland (HCC)   HCAP (healthcare-associated pneumonia)   Severe sepsis (HCC)   CHF (congestive heart failure) (HCC)   Acute on chronic respiratory failure with hypoxia ([place])   1. Acute on chronic respiratory failure with hypoxia continue with weaning on pressure support continue pulmonary toilet supportive care.  Patient saturations are 96%.  The spontaneous breathing index was better today however not sure if he is going to be able to continue to advance on the wean.  We will continue with supportive care. 2. Paroxysmal atrial fibrillation rate is controlled we will continue to follow along. 3. Primary adenocarcinoma thyroid metastatic disease continue supportive care 4. Healthcare associated pneumonia treated with antibiotics we will follow 5. Severe sepsis has improved hemodynamically stable at this time 6. CHF continue present management   I have personally seen and evaluated the patient, evaluated laboratory and imaging results, formulated the assessment and plan and placed orders. The Patient requires high complexity decision making for assessment and support.  Case was discussed on Rounds with the Respiratory  Therapy Staff  Allyne Gee, MD [facility] Pulmonary Critical Care Medicine Sleep Medicine

## 2017-12-30 LAB — PROTIME-INR
INR: 2.3
PROTHROMBIN TIME: 25.1 s — AB (ref 11.4–15.2)

## 2017-12-30 LAB — VANCOMYCIN, TROUGH: Vancomycin Tr: 11 ug/mL — ABNORMAL LOW (ref 15–20)

## 2017-12-30 NOTE — Progress Notes (Signed)
Pulmonary [place]   PULMONARY SERVICE  PROGRESS NOTE  Date of Service: 12/30/2017  [name] [name]  [id number]  DOB: Jan 17, 1956   DOA: 12/20/2017  Referring Physician: Merton Border, MD  HPI: [name] [name] is a 62 y.o. male seen for follow up of Acute on Chronic Respiratory Failure.  Still with low-grade fevers however patient is weaning today patient was on T collar on 35% oxygen.  Good saturations are noted at this time.  The goal is for 4 hours  Medications: Reviewed on Rounds  Physical Exam:  Vitals: Temperature 100.1 pulse 108 respiratory rate 24 blood pressure 148/73 saturations 94%  Ventilator Settings off the ventilator on T collar  . General: Comfortable at this time . Eyes: Grossly normal lids, irises & conjunctiva . ENT: grossly tongue is normal . Neck: no obvious mass . Cardiovascular: S1 S2 normal no gallop . Respiratory: No rhonchi rales are noted . Abdomen: soft . Skin: no rash seen on limited exam . Musculoskeletal: not rigid . Psychiatric:unable to assess . Neurologic: no seizure no involuntary movements         Lab Data:   Basic Metabolic Panel: Recent Labs  Lab 12/26/17 0610 12/27/17 0619 12/29/17 0559  NA 137 138 137  K 4.1 3.9 3.7  CL 91* 95* 96*  CO2 34* 34* 29  GLUCOSE 204* 250* 211*  BUN 26* 24* 20  CREATININE 0.49* 0.48* 0.46*  CALCIUM 9.7 9.6 9.5    Liver Function Tests: No results for input(s): AST, ALT, ALKPHOS, BILITOT, PROT, ALBUMIN in the last 168 hours. No results for input(s): LIPASE, AMYLASE in the last 168 hours. No results for input(s): AMMONIA in the last 168 hours.  CBC: Recent Labs  Lab 12/25/17 0623 12/27/17 0619 12/29/17 0559  WBC 10.6* 8.2 11.7*  HGB 11.6* 11.0* 11.7*  HCT 39.1 36.2* 38.4*  MCV 89.7 89.2 88.1  PLT 332 263 311    Cardiac Enzymes: No results for input(s): CKTOTAL, CKMB, CKMBINDEX, TROPONINI in the last 168 hours.  BNP (last 3 results) Recent  Labs    11/22/17 0116  BNP 170.4*    ProBNP (last 3 results) No results for input(s): PROBNP in the last 8760 hours.  Radiological Exams: No results found.  Assessment/Plan Active Problems:   OSA (obstructive sleep apnea)   PAF (paroxysmal atrial fibrillation) (HCC)   Basilar artery occlusion   Primary papillary adenocarcinoma of thyroid gland (HCC)   HCAP (healthcare-associated pneumonia)   Severe sepsis (HCC)   CHF (congestive heart failure) (HCC)   Acute on chronic respiratory failure with hypoxia ([place])   1. Acute on chronic respiratory failure with hypoxia we will continue to wean on T collar as ordered.  Patient has been having periodic tachycardia we will discuss with primary care.  Continue supportive care 2. Primary adenocarcinoma thyroid will continue with supportive care patient sounds metastatic disease 3. Healthcare associated pneumonia treated with antibiotics we will continue to follow 4. Sepsis hemodynamically stable resolved 5. Congestive heart failure compensated at this time. 6. Paroxysmal atrial fibrillation has been having tachycardia we will as noted above discussed with the primary care team 7. 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 The Patient requires high complexity decision making for assessment and support.  Case was discussed on Rounds with the Respiratory Therapy Staff  Allyne Gee, MD [facility] Pulmonary Critical Care Medicine Sleep Medicine

## 2017-12-31 LAB — CBC
HEMATOCRIT: 39.1 % (ref 39.0–52.0)
Hemoglobin: 11.7 g/dL — ABNORMAL LOW (ref 13.0–17.0)
MCH: 27 pg (ref 26.0–34.0)
MCHC: 29.9 g/dL — ABNORMAL LOW (ref 30.0–36.0)
MCV: 90.3 fL (ref 78.0–100.0)
PLATELETS: 435 10*3/uL — AB (ref 150–400)
RBC: 4.33 MIL/uL (ref 4.22–5.81)
RDW: 17.1 % — AB (ref 11.5–15.5)
WBC: 10.4 10*3/uL (ref 4.0–10.5)

## 2017-12-31 LAB — PROTIME-INR
INR: 2.71
PROTHROMBIN TIME: 28.5 s — AB (ref 11.4–15.2)

## 2017-12-31 LAB — VANCOMYCIN, TROUGH: VANCOMYCIN TR: 18 ug/mL (ref 15–20)

## 2017-12-31 LAB — CULTURE, RESPIRATORY

## 2017-12-31 LAB — CULTURE, RESPIRATORY W GRAM STAIN

## 2017-12-31 NOTE — Progress Notes (Signed)
Pulmonary [place]   PULMONARY SERVICE  PROGRESS NOTE  Date of Service: 12/31/2017  [name] [name]  HQR:[id number]  DOB: 1956/03/14   DOA: 11/25/2017  Referring Physician: Merton Border, MD  HPI: [name] [name] is a 62 y.o. male seen for follow up of Acute on Chronic Respiratory Failure.  Patient is currently on collar has been doing failure with 40% FiO2.  The goal is for about 6 hours  Medications: Reviewed on Rounds  Physical Exam:  Vitals: Temperature 98.5 pulse 110 respiratory rate 30 blood pressure 123/68 saturations 94%  Ventilator Settings off the ventilator on T collar  . General: Comfortable at this time . Eyes: Grossly normal lids, irises & conjunctiva . ENT: grossly tongue is normal . Neck: no obvious mass . Cardiovascular: S1 S2 normal no gallop . Respiratory: No rhonchi or rales . Abdomen: soft . Skin: no rash seen on limited exam . Musculoskeletal: not rigid . Psychiatric:unable to assess . Neurologic: no seizure no involuntary movements         Lab Data:   Basic Metabolic Panel: Recent Labs  Lab 12/26/17 0610 12/27/17 0619 12/29/17 0559  NA 137 138 137  K 4.1 3.9 3.7  CL 91* 95* 96*  CO2 34* 34* 29  GLUCOSE 204* 250* 211*  BUN 26* 24* 20  CREATININE 0.49* 0.48* 0.46*  CALCIUM 9.7 9.6 9.5    Liver Function Tests: No results for input(s): AST, ALT, ALKPHOS, BILITOT, PROT, ALBUMIN in the last 168 hours. No results for input(s): LIPASE, AMYLASE in the last 168 hours. No results for input(s): AMMONIA in the last 168 hours.  CBC: Recent Labs  Lab 12/25/17 0623 12/27/17 0619 12/29/17 0559 12/31/17 0522  WBC 10.6* 8.2 11.7* 10.4  HGB 11.6* 11.0* 11.7* 11.7*  HCT 39.1 36.2* 38.4* 39.1  MCV 89.7 89.2 88.1 90.3  PLT 332 263 311 435*    Cardiac Enzymes: No results for input(s): CKTOTAL, CKMB, CKMBINDEX, TROPONINI in the last 168 hours.  BNP (last 3 results) Recent Labs    11/22/17 0116  BNP  170.4*    ProBNP (last 3 results) No results for input(s): PROBNP in the last 8760 hours.  Radiological Exams: No results found.  Assessment/Plan Active Problems:   OSA (obstructive sleep apnea)   PAF (paroxysmal atrial fibrillation) (HCC)   Basilar artery occlusion   Primary papillary adenocarcinoma of thyroid gland (HCC)   HCAP (healthcare-associated pneumonia)   Severe sepsis (HCC)   CHF (congestive heart failure) (HCC)   Acute on chronic respiratory failure with hypoxia ([place])   1. Acute on chronic respiratory failure with hypoxia we will continue with weaning on T collar as mentioned the goal is for 6 hours. 2. Paroxysmal atrial fibrillation rate is controlled we will continue to follow 3. Basilar artery occlusion with stroke stable we will continue present management 4. Healthcare associated pneumonia treated follow x-ray as necessary 5. Papillary adenocarcinoma metastatic disease poor prognosis 6. Congestive heart failure fluid compensated we will continue to monitor   I have personally seen and evaluated the patient, evaluated laboratory and imaging results, formulated the assessment and plan and placed orders. The Patient requires high complexity decision making for assessment and support.  Case was discussed on Rounds with the Respiratory Therapy Staff  Allyne Gee, MD [facility] Pulmonary Critical Care Medicine Sleep Medicine

## 2018-01-01 LAB — URINALYSIS, ROUTINE W REFLEX MICROSCOPIC
Bilirubin Urine: NEGATIVE
GLUCOSE, UA: NEGATIVE mg/dL
Ketones, ur: 5 mg/dL — AB
NITRITE: NEGATIVE
PH: 5 (ref 5.0–8.0)
PROTEIN: 30 mg/dL — AB
Specific Gravity, Urine: 1.033 — ABNORMAL HIGH (ref 1.005–1.030)

## 2018-01-01 LAB — PROTIME-INR
INR: 2.65
PROTHROMBIN TIME: 28.1 s — AB (ref 11.4–15.2)

## 2018-01-01 LAB — URINE CULTURE: CULTURE: NO GROWTH

## 2018-01-01 NOTE — Progress Notes (Signed)
Pulmonary [place]   PULMONARY SERVICE  PROGRESS NOTE  Date of Service: 01/01/2018  Haruo Stepanek  VFI:[id number]  DOB: April 04, 1956   DOA: 12/05/2017  Referring Physician: Merton Border, MD  HPI: Carliss Quast is a 62 y.o. male seen for follow up of Acute on Chronic Respiratory Failure.  Patient is on the ventilator this morning.  On full support was on pressure assist control mode requiring about 45% FiO2.  Yesterday patient was running on T collar  Medications: Reviewed on Rounds  Physical Exam:  Vitals: Temperature 100.9 pulse 118 respiratory 24 blood pressure 152/72 saturations 98%  Ventilator Settings mode of ventilation pressure assist control FiO2 45% tidal line 563 PEEP 7  . General: Comfortable at this time . Eyes: Grossly normal lids, irises & conjunctiva . ENT: grossly tongue is normal . Neck: no obvious mass . Cardiovascular: S1 S2 normal no gallop . Respiratory: No rhonchi or rales are noted . Abdomen: soft . Skin: no rash seen on limited exam . Musculoskeletal: not rigid . Psychiatric:unable to assess . Neurologic: no seizure no involuntary movements         Lab Data:   Basic Metabolic Panel: Recent Labs  Lab 12/26/17 0610 12/27/17 0619 12/29/17 0559  NA 137 138 137  K 4.1 3.9 3.7  CL 91* 95* 96*  CO2 34* 34* 29  GLUCOSE 204* 250* 211*  BUN 26* 24* 20  CREATININE 0.49* 0.48* 0.46*  CALCIUM 9.7 9.6 9.5    Liver Function Tests: No results for input(s): AST, ALT, ALKPHOS, BILITOT, PROT, ALBUMIN in the last 168 hours. No results for input(s): LIPASE, AMYLASE in the last 168 hours. No results for input(s): AMMONIA in the last 168 hours.  CBC: Recent Labs  Lab 12/27/17 0619 12/29/17 0559 12/31/17 0522  WBC 8.2 11.7* 10.4  HGB 11.0* 11.7* 11.7*  HCT 36.2* 38.4* 39.1  MCV 89.2 88.1 90.3  PLT 263 311 435*    Cardiac Enzymes: No results for input(s): CKTOTAL, CKMB, CKMBINDEX, TROPONINI in the last  168 hours.  BNP (last 3 results) Recent Labs    11/22/17 0116  BNP 170.4*    ProBNP (last 3 results) No results for input(s): PROBNP in the last 8760 hours.  Radiological Exams: No results found.  Assessment/Plan Active Problems:   OSA (obstructive sleep apnea)   PAF (paroxysmal atrial fibrillation) (HCC)   Basilar artery occlusion   Primary papillary adenocarcinoma of thyroid gland (HCC)   HCAP (healthcare-associated pneumonia)   Severe sepsis (HCC)   CHF (congestive heart failure) (HCC)   Acute on chronic respiratory failure with hypoxia ([place])   1. Acute on chronic respiratory failure with hypoxia we will continue full vent support at this time.  Repeat the spontaneous breathing index if it is good then try to resume on T collar. 2. Paroxysmal atrial fibrillation rate is controlled we will continue to follow 3. Basilar artery occlusion at baseline 4. Primary papillary adenocarcinoma thyroid stable we will continue to monitor 5. Healthcare associated pneumonia treated we will follow 6. Severe sepsis at baseline continue present management. 7. Congestive heart failure continue with supportive care monitor fluid status   I have personally seen and evaluated the patient, evaluated laboratory and imaging results, formulated the assessment and plan and placed orders. The Patient requires high complexity decision making for assessment and support.  Case was discussed on Rounds with the Respiratory Therapy Staff  Allyne Gee, MD [facility] Pulmonary Critical Care Medicine Sleep Medicine

## 2018-01-02 LAB — CBC
HEMATOCRIT: 36.9 % — AB (ref 39.0–52.0)
Hemoglobin: 11.1 g/dL — ABNORMAL LOW (ref 13.0–17.0)
MCH: 27.1 pg (ref 26.0–34.0)
MCHC: 30.1 g/dL (ref 30.0–36.0)
MCV: 90.2 fL (ref 78.0–100.0)
PLATELETS: 361 10*3/uL (ref 150–400)
RBC: 4.09 MIL/uL — ABNORMAL LOW (ref 4.22–5.81)
RDW: 16.6 % — ABNORMAL HIGH (ref 11.5–15.5)
WBC: 10.9 10*3/uL — AB (ref 4.0–10.5)

## 2018-01-02 LAB — PROTIME-INR
INR: 2.36
PROTHROMBIN TIME: 25.6 s — AB (ref 11.4–15.2)

## 2018-01-02 NOTE — Progress Notes (Signed)
Pulmonary [place]   PULMONARY SERVICE  PROGRESS NOTE  Date of Service: 01/02/2018  [name] [name]  [id number]  DOB: March 04, 1956   DOA: 11/29/2017  Referring Physician: Merton Border, MD  HPI: [name] [name] is a 62 y.o. male seen for follow up of Acute on Chronic Respiratory Failure.  Patient is back on the ventilator and full support.  Patient not been tolerating the weaning today and is comfortable without distress on pressure assist control  Medications: Reviewed on Rounds  Physical Exam:  Vitals: Temperature 100.3 pulse 109 respiratory rate 34 blood pressure 130/68 saturations 98%  Ventilator Settings mode of ventilation pressure assist control FiO2 45% tidal volume 537 PEEP 7  . General: Comfortable at this time . Eyes: Grossly normal lids, irises & conjunctiva . ENT: grossly tongue is normal . Neck: no obvious mass . Cardiovascular: S1 S2 normal no gallop . Respiratory: No rhonchi or rales are noted . Abdomen: soft . Skin: no rash seen on limited exam . Musculoskeletal: not rigid . Psychiatric:unable to assess . Neurologic: no seizure no involuntary movements         Lab Data:   Basic Metabolic Panel: Recent Labs  Lab 12/27/17 0619 12/29/17 0559  NA 138 137  K 3.9 3.7  CL 95* 96*  CO2 34* 29  GLUCOSE 250* 211*  BUN 24* 20  CREATININE 0.48* 0.46*  CALCIUM 9.6 9.5    Liver Function Tests: No results for input(s): AST, ALT, ALKPHOS, BILITOT, PROT, ALBUMIN in the last 168 hours. No results for input(s): LIPASE, AMYLASE in the last 168 hours. No results for input(s): AMMONIA in the last 168 hours.  CBC: Recent Labs  Lab 12/27/17 0619 12/29/17 0559 12/31/17 0522 01/02/18 0638  WBC 8.2 11.7* 10.4 10.9*  HGB 11.0* 11.7* 11.7* 11.1*  HCT 36.2* 38.4* 39.1 36.9*  MCV 89.2 88.1 90.3 90.2  PLT 263 311 435* 361    Cardiac Enzymes: No results for input(s): CKTOTAL, CKMB, CKMBINDEX, TROPONINI in the last 168  hours.  BNP (last 3 results) Recent Labs    11/22/17 0116  BNP 170.4*    ProBNP (last 3 results) No results for input(s): PROBNP in the last 8760 hours.  Radiological Exams: No results found.  Assessment/Plan Active Problems:   OSA (obstructive sleep apnea)   PAF (paroxysmal atrial fibrillation) (HCC)   Basilar artery occlusion   Primary papillary adenocarcinoma of thyroid gland (HCC)   HCAP (healthcare-associated pneumonia)   Severe sepsis (HCC)   CHF (congestive heart failure) (HCC)   Acute on chronic respiratory failure with hypoxia ([place])   1. Acute on chronic respiratory failure with hypoxia continue with full support on pressure assist control continue secretion management pulmonary toilet.  Patient right now has been having low-grade fevers and therefore the wounds are being held. 2. Papillary adenocarcinoma metastatic disease we will continue with supportive care. 3. Healthcare associated pneumonia treated now with fever once again we will continue to follow along work-up being done for the new fever. 4. Severe sepsis hemodynamically stable we will continue to monitor 5. Congestive heart failure at baseline we will continue with supportive care 6. Paroxysmal atrial fibrillation heart rate was elevated once again being addressed by primary care team   I have personally seen and evaluated the patient, evaluated laboratory and imaging results, formulated the assessment and plan and placed orders. The Patient requires high complexity decision making for assessment and support.  Case was discussed on Rounds with the Respiratory Therapy  Staff  Allyne Gee, MD [facility] Pulmonary Critical Care Medicine Sleep Medicine

## 2018-01-03 LAB — PROTIME-INR
INR: 2.07
Prothrombin Time: 23.1 seconds — ABNORMAL HIGH (ref 11.4–15.2)

## 2018-01-03 NOTE — Progress Notes (Signed)
Pulmonary [name]   PULMONARY SERVICE  PROGRESS NOTE  Date of Service: 01/03/2018  [name] [name]  [id number]  DOB: 09-23-1955   DOA: 12/04/2017  Referring Physician: Merton Border, MD  HPI: [name] [name] is a 62 y.o. male seen for follow up of Acute on Chronic Respiratory Failure.  Patient was able to do about 2 hours and 2 minutes now is back on the ventilator and pressure assist control mode  Medications: Reviewed on Rounds  Physical Exam:  Vitals: Temperature 98.5 pulse 105 respiratory 36 blood pressure 151/83 saturations 100%  Ventilator Settings mode of ventilation pressure assist control FiO2 45% tidal volume 480 PEEP 7  . General: Comfortable at this time . Eyes: Grossly normal lids, irises & conjunctiva . ENT: grossly tongue is normal . Neck: no obvious mass . Cardiovascular: S1 S2 normal no gallop . Respiratory: No rhonchi or rales . Abdomen: soft . Skin: no rash seen on limited exam . Musculoskeletal: not rigid . Psychiatric:unable to assess . Neurologic: no seizure no involuntary movements         Lab Data:   Basic Metabolic Panel: Recent Labs  Lab 12/29/17 0559  NA 137  K 3.7  CL 96*  CO2 29  GLUCOSE 211*  BUN 20  CREATININE 0.46*  CALCIUM 9.5    Liver Function Tests: No results for input(s): AST, ALT, ALKPHOS, BILITOT, PROT, ALBUMIN in the last 168 hours. No results for input(s): LIPASE, AMYLASE in the last 168 hours. No results for input(s): AMMONIA in the last 168 hours.  CBC: Recent Labs  Lab 12/29/17 0559 12/31/17 0522 01/02/18 0638  WBC 11.7* 10.4 10.9*  HGB 11.7* 11.7* 11.1*  HCT 38.4* 39.1 36.9*  MCV 88.1 90.3 90.2  PLT 311 435* 361    Cardiac Enzymes: No results for input(s): CKTOTAL, CKMB, CKMBINDEX, TROPONINI in the last 168 hours.  BNP (last 3 results) Recent Labs    11/22/17 0116  BNP 170.4*    ProBNP (last 3 results) No results for input(s): PROBNP in the last 8760  hours.  Radiological Exams: No results found.  Assessment/Plan Active Problems:   OSA (obstructive sleep apnea)   PAF (paroxysmal atrial fibrillation) (HCC)   Basilar artery occlusion   Primary papillary adenocarcinoma of thyroid gland (HCC)   HCAP (healthcare-associated pneumonia)   Severe sepsis (HCC)   CHF (congestive heart failure) (HCC)   Acute on chronic respiratory failure with hypoxia ([place])   1. Acute on chronic respiratory failure with hypoxia continue with attempting weans as tolerated on the T collar.  Today did okay with 2 hours and 20 minutes but had to be placed back 2. Paroxysmal atrial fibrillation rate is controlled we will continue to follow 3. Status post stroke basilar artery occlusion locked-in we will continue with supportive care 4. Primary papillary adenocarcinoma with mets prognosis poor continue with supportive care 5. Healthcare associated pneumonia treated we will follow 6. Severe sepsis hemodynamically stable 7. CHF follow-up fluid status closely   I have personally seen and evaluated the patient, evaluated laboratory and imaging results, formulated the assessment and plan and placed orders. The Patient requires high complexity decision making for assessment and support.  Case was discussed on Rounds with the Respiratory Therapy Staff  Allyne Gee, MD [facility] Pulmonary Critical Care Medicine Sleep Medicine

## 2018-01-04 ENCOUNTER — Other Ambulatory Visit (HOSPITAL_COMMUNITY): Payer: Self-pay

## 2018-01-04 LAB — CBC
HEMATOCRIT: 36.7 % — AB (ref 39.0–52.0)
HEMATOCRIT: 36 % — AB (ref 39.0–52.0)
HEMATOCRIT: 36 % — AB (ref 39.0–52.0)
HEMOGLOBIN: 11 g/dL — AB (ref 13.0–17.0)
Hemoglobin: 10.7 g/dL — ABNORMAL LOW (ref 13.0–17.0)
Hemoglobin: 10.8 g/dL — ABNORMAL LOW (ref 13.0–17.0)
MCH: 26.9 pg (ref 26.0–34.0)
MCH: 27 pg (ref 26.0–34.0)
MCH: 26.7 pg (ref 26.0–34.0)
MCHC: 30 g/dL (ref 30.0–36.0)
MCHC: 29.7 g/dL — ABNORMAL LOW (ref 30.0–36.0)
MCHC: 30 g/dL (ref 30.0–36.0)
MCV: 89.7 fL (ref 78.0–100.0)
MCV: 90.7 fL (ref 78.0–100.0)
MCV: 89.1 fL (ref 78.0–100.0)
PLATELETS: 344 10*3/uL (ref 150–400)
Platelets: 320 10*3/uL (ref 150–400)
Platelets: 323 10*3/uL (ref 150–400)
RBC: 4.09 MIL/uL — ABNORMAL LOW (ref 4.22–5.81)
RBC: 3.97 MIL/uL — ABNORMAL LOW (ref 4.22–5.81)
RBC: 4.04 MIL/uL — ABNORMAL LOW (ref 4.22–5.81)
RDW: 17.3 % — ABNORMAL HIGH (ref 11.5–15.5)
RDW: 17.2 % — AB (ref 11.5–15.5)
RDW: 17.2 % — AB (ref 11.5–15.5)
WBC: 10.7 10*3/uL — ABNORMAL HIGH (ref 4.0–10.5)
WBC: 12.6 10*3/uL — ABNORMAL HIGH (ref 4.0–10.5)
WBC: 11.6 10*3/uL — ABNORMAL HIGH (ref 4.0–10.5)

## 2018-01-04 LAB — BASIC METABOLIC PANEL
Anion gap: 10 (ref 5–15)
BUN: 16 mg/dL (ref 8–23)
CHLORIDE: 106 mmol/L (ref 98–111)
CO2: 25 mmol/L (ref 22–32)
CREATININE: 0.38 mg/dL — AB (ref 0.61–1.24)
Calcium: 9.4 mg/dL (ref 8.9–10.3)
GFR calc Af Amer: >60 mL/min (ref >60)
GFR calc non Af Amer: >60 mL/min (ref >60)
GLUCOSE: 139 mg/dL — AB (ref 70–99)
POTASSIUM: 3.8 mmol/L (ref 3.5–5.1)
Sodium: 141 mmol/L (ref 135–145)

## 2018-01-04 NOTE — Progress Notes (Signed)
Patient ID: [name] [name], male   DOB: [birth year], 62 y.o.   MRN: [id number]  Up to see patient following attempted CTA Chest.   Pt had extravasation of small amount of contrast following uneventful iv test.  Right AC site soft and non tender without bruising or blistering.  Orders placed for observation and localized care.  Spoke with nurse regarding orders and further follow up.Marland Kitchen

## 2018-01-04 NOTE — Progress Notes (Signed)
Pulmonary [place]   PULMONARY SERVICE  PROGRESS NOTE  Date of Service: 01/04/2018  Abelardo Seidner  [id number]:[id number]  DOB: 1955-10-19   DOA: 12/22/2017  Referring Physician: Merton Border, MD  HPI: [name] [name] is a 62 y.o. male seen for follow up of Acute on Chronic Respiratory Failure.  Patient is on T collar right now has been on 45% FiO2.  The patient has had increased heart rate overnight.  Was on also having some increased secretions noted.  There is been some blood-tinged also noted in secretions.  Medications: Reviewed on Rounds  Physical Exam:  Vitals: Temperature 99.1 pulse 120 yesterday 25 blood pressure 126/79 saturations 97%  Ventilator Settings off the ventilator on T collar FiO2 45%  . General: Comfortable at this time . Eyes: Grossly normal lids, irises & conjunctiva . ENT: grossly tongue is normal . Neck: no obvious mass . Cardiovascular: S1 S2 normal no gallop . Respiratory: Coarse rhonchi no rales . Abdomen: soft . Skin: no rash seen on limited exam . Musculoskeletal: not rigid . Psychiatric:unable to assess . Neurologic: no seizure no involuntary movements         Lab Data:   Basic Metabolic Panel: Recent Labs  Lab 12/29/17 0559 01/04/18 0606  NA 137 141  K 3.7 3.8  CL 96* 106  CO2 29 25  GLUCOSE 211* 139*  BUN 20 16  CREATININE 0.46* 0.38*  CALCIUM 9.5 9.4    Liver Function Tests: No results for input(s): AST, ALT, ALKPHOS, BILITOT, PROT, ALBUMIN in the last 168 hours. No results for input(s): LIPASE, AMYLASE in the last 168 hours. No results for input(s): AMMONIA in the last 168 hours.  CBC: Recent Labs  Lab 12/29/17 0559 12/31/17 0522 01/02/18 0160 01/04/18 0606  WBC 11.7* 10.4 10.9* 10.7*  HGB 11.7* 11.7* 11.1* 11.0*  HCT 38.4* 39.1 36.9* 36.7*  MCV 88.1 90.3 90.2 89.7  PLT 311 435* 361 320    Cardiac Enzymes: No results for input(s): CKTOTAL, CKMB, CKMBINDEX, TROPONINI in the last  168 hours.  BNP (last 3 results) Recent Labs    11/22/17 0116  BNP 170.4*    ProBNP (last 3 results) No results for input(s): PROBNP in the last 8760 hours.  Radiological Exams: No results found.  Assessment/Plan Active Problems:   OSA (obstructive sleep apnea)   PAF (paroxysmal atrial fibrillation) (HCC)   Basilar artery occlusion   Primary papillary adenocarcinoma of thyroid gland (HCC)   HCAP (healthcare-associated pneumonia)   Severe sepsis (HCC)   CHF (congestive heart failure) (HCC)   Acute on chronic respiratory failure with hypoxia ([place])   1. Acute on chronic respiratory failure with hypoxia we will continue with T collar continue with titrating oxygen as tolerated. 2. Primary papillary adenocarcinoma I have ordered a CT scan of the chest as well as the soft tissue neck to get an idea as to source of the hemoptysis. 3. Healthcare associated pneumonia treated with antibiotics follow-up CT 8 look at progress 4. Severe sepsis hemodynamically stable 5. CHF at baseline    I have personally seen and evaluated the patient, evaluated laboratory and imaging results, formulated the assessment and plan and placed orders. The Patient requires high complexity decision making for assessment and support.  Case was discussed on Rounds with the Respiratory Therapy Staff  Allyne Gee, MD [facility] Pulmonary Critical Care Medicine Sleep Medicine

## 2018-01-05 LAB — CBC
HCT: 35.2 % — ABNORMAL LOW (ref 39.0–52.0)
HCT: 38.9 % — ABNORMAL LOW (ref 39.0–52.0)
HCT: 35 % — ABNORMAL LOW (ref 39.0–52.0)
HCT: 35.6 % — ABNORMAL LOW (ref 39.0–52.0)
Hemoglobin: 10.4 g/dL — ABNORMAL LOW (ref 13.0–17.0)
Hemoglobin: 11.9 g/dL — ABNORMAL LOW (ref 13.0–17.0)
Hemoglobin: 10.3 g/dL — ABNORMAL LOW (ref 13.0–17.0)
Hemoglobin: 10.5 g/dL — ABNORMAL LOW (ref 13.0–17.0)
MCH: 26.8 pg (ref 26.0–34.0)
MCH: 27.5 pg (ref 26.0–34.0)
MCH: 26.8 pg (ref 26.0–34.0)
MCH: 26.9 pg (ref 26.0–34.0)
MCHC: 29.5 g/dL — ABNORMAL LOW (ref 30.0–36.0)
MCHC: 30.6 g/dL (ref 30.0–36.0)
MCHC: 29.4 g/dL — AB (ref 30.0–36.0)
MCHC: 29.5 g/dL — ABNORMAL LOW (ref 30.0–36.0)
MCV: 90.7 fL (ref 78.0–100.0)
MCV: 89.8 fL (ref 78.0–100.0)
MCV: 90.9 fL (ref 78.0–100.0)
MCV: 91 fL (ref 78.0–100.0)
PLATELETS: 319 10*3/uL (ref 150–400)
PLATELETS: 311 10*3/uL (ref 150–400)
PLATELETS: 322 10*3/uL (ref 150–400)
PLATELETS: 335 10*3/uL (ref 150–400)
RBC: 3.88 MIL/uL — ABNORMAL LOW (ref 4.22–5.81)
RBC: 4.33 MIL/uL (ref 4.22–5.81)
RBC: 3.85 MIL/uL — ABNORMAL LOW (ref 4.22–5.81)
RBC: 3.91 MIL/uL — AB (ref 4.22–5.81)
RDW: 17.2 % — AB (ref 11.5–15.5)
RDW: 17.3 % — AB (ref 11.5–15.5)
RDW: 17.4 % — AB (ref 11.5–15.5)
RDW: 17.3 % — ABNORMAL HIGH (ref 11.5–15.5)
WBC: 12.5 10*3/uL — AB (ref 4.0–10.5)
WBC: 11.3 10*3/uL — ABNORMAL HIGH (ref 4.0–10.5)
WBC: 10.4 10*3/uL (ref 4.0–10.5)
WBC: 10.4 10*3/uL (ref 4.0–10.5)

## 2018-01-05 LAB — PROTIME-INR
INR: 2.8
PROTHROMBIN TIME: 29.3 s — AB (ref 11.4–15.2)

## 2018-01-05 LAB — PREPARE RBC (CROSSMATCH)

## 2018-01-05 NOTE — Progress Notes (Signed)
Pulmonary [place]   PULMONARY SERVICE  PROGRESS NOTE  Date of Service: 01/05/2018  Eulis Salazar  [id number]  DOB: 1955/11/23   DOA: 12/11/2017  Referring Physician: Merton Border, MD  HPI: [name] [name] is a 62 y.o. male seen for follow up of Acute on Chronic Respiratory Failure.  Overnight patient had hemoptysis.  He actually coughed up what appears to be some fleshy material which is sitting in a specimen jar.  Patient's bleeding has eased up a little bit however he continues to have some extravasation.  Patient was supposed to have a CT scan and he was not able to have it because of this.  Currently is on full support on assist control mode is on 50% FiO2 with a PEEP of 7  Medications: Reviewed on Rounds  Physical Exam:  Vitals: Temperature 98.5 pulse 92 respiratory rate was 17 blood pressure 115/71 saturations 97%  Ventilator Settings currently on assist control FiO2 50% tidal volume 450 PEEP 7  . General: Comfortable at this time . Eyes: Grossly normal lids, irises & conjunctiva . ENT: grossly tongue is normal . Neck: no obvious mass . Cardiovascular: S1 S2 normal no gallop . Respiratory: Coarse breath sounds are noted with scattered rhonchi . Abdomen: soft . Skin: no rash seen on limited exam . Musculoskeletal: not rigid . Psychiatric:unable to assess . Neurologic: no seizure no involuntary movements         Lab Data:   Basic Metabolic Panel: Recent Labs  Lab 01/04/18 0606  NA 141  K 3.8  CL 106  CO2 25  GLUCOSE 139*  BUN 16  CREATININE 0.38*  CALCIUM 9.4    Liver Function Tests: No results for input(s): AST, ALT, ALKPHOS, BILITOT, PROT, ALBUMIN in the last 168 hours. No results for input(s): LIPASE, AMYLASE in the last 168 hours. No results for input(s): AMMONIA in the last 168 hours.  CBC: Recent Labs  Lab 01/04/18 1358 01/04/18 1943 01/05/18 0228 01/05/18 1008 01/05/18 1437  WBC 12.6* 11.6* 12.5*  11.3* 10.4  HGB 10.7* 10.8* 10.4* 11.9* 10.3*  HCT 36.0* 36.0* 35.2* 38.9* 35.0*  MCV 90.7 89.1 90.7 89.8 90.9  PLT 344 323 319 311 322    Cardiac Enzymes: No results for input(s): CKTOTAL, CKMB, CKMBINDEX, TROPONINI in the last 168 hours.  BNP (last 3 results) Recent Labs    11/22/17 0116  BNP 170.4*    ProBNP (last 3 results) No results for input(s): PROBNP in the last 8760 hours.  Radiological Exams: No results found.  Assessment/Plan Active Problems:   OSA (obstructive sleep apnea)   PAF (paroxysmal atrial fibrillation) (HCC)   Basilar artery occlusion   Primary papillary adenocarcinoma of thyroid gland (HCC)   HCAP (healthcare-associated pneumonia)   Severe sepsis (HCC)   CHF (congestive heart failure) (HCC)   Acute on chronic respiratory failure with hypoxia ([place])   1. Acute on chronic respiratory failure with hypoxia patient right now is going to be continued on full vent support.  He is not wean able.  What is concerning is the material that he apparently coughed up I would recommend sending this down to the lab for analysis. 2. Paroxysmal atrial fibrillation currently the rate is controlled 3. Healthcare associated pneumonia has been treated with antibiotics 4. 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 Severe sepsis with shock now hemodynamically is doing better blood pressure has been stable 6. Congestive heart failure we will continue with present management seems  to be compensated  Overall patient is critically ill at this time he is not stable enough to even go down to have a CT scan.  I am going to put the CT scan on hold because of the bleeding that he has been having.  As noted above I would send the specimen for analysis in the lab.  This patient is basically not going to come off of the ventilator his chances of dying are extremely high and his care is bordering on futility at this point   I have personally seen and  evaluated the patient, evaluated laboratory and imaging results, formulated the assessment and plan and placed orders. The Patient requires high complexity decision making for assessment and support.  Case was discussed on Rounds with the Respiratory Therapy Staff time spent 35 minutes discussion of the case on rounds as well as with the nursing staff and primary care team  Allyne Gee, MD [facility] Pulmonary Critical Care Medicine Sleep Medicine

## 2018-01-06 LAB — CBC
HCT: 34 % — ABNORMAL LOW (ref 39.0–52.0)
HEMATOCRIT: 32.6 % — AB (ref 39.0–52.0)
HEMATOCRIT: 32.7 % — AB (ref 39.0–52.0)
HEMOGLOBIN: 9.6 g/dL — AB (ref 13.0–17.0)
Hemoglobin: 10 g/dL — ABNORMAL LOW (ref 13.0–17.0)
Hemoglobin: 9.6 g/dL — ABNORMAL LOW (ref 13.0–17.0)
MCH: 27 pg (ref 26.0–34.0)
MCH: 27 pg (ref 26.0–34.0)
MCH: 27 pg (ref 26.0–34.0)
MCHC: 29.4 g/dL — ABNORMAL LOW (ref 30.0–36.0)
MCHC: 29.4 g/dL — ABNORMAL LOW (ref 30.0–36.0)
MCHC: 29.4 g/dL — AB (ref 30.0–36.0)
MCV: 91.6 fL (ref 78.0–100.0)
MCV: 91.8 fL (ref 78.0–100.0)
MCV: 92.1 fL (ref 78.0–100.0)
PLATELETS: 284 10*3/uL (ref 150–400)
PLATELETS: 273 10*3/uL (ref 150–400)
Platelets: 275 10*3/uL (ref 150–400)
RBC: 3.71 MIL/uL — AB (ref 4.22–5.81)
RBC: 3.55 MIL/uL — ABNORMAL LOW (ref 4.22–5.81)
RBC: 3.55 MIL/uL — ABNORMAL LOW (ref 4.22–5.81)
RDW: 17.3 % — ABNORMAL HIGH (ref 11.5–15.5)
RDW: 17.3 % — AB (ref 11.5–15.5)
RDW: 17.2 % — ABNORMAL HIGH (ref 11.5–15.5)
WBC: 11.1 10*3/uL — ABNORMAL HIGH (ref 4.0–10.5)
WBC: 9.7 10*3/uL (ref 4.0–10.5)
WBC: 9.7 10*3/uL (ref 4.0–10.5)

## 2018-01-06 NOTE — Progress Notes (Signed)
Pulmonary [place]   PULMONARY SERVICE  PROGRESS NOTE  Date of Service: 01/06/2018  [name] [name]  YBO:[id number]  DOB: [birth year]   DOA: 11/26/2017  Referring Physician: Merton Border, MD  HPI: [name] [name] is a 62 y.o. male seen for follow up of Acute on Chronic Respiratory Failure.  Patient is doing very poorly.  He continues to have hemoptysis.  He is actually had episodes of bradycardia anytime that he is moved.  Continues to have debris in his airway which appears to be necrotic tissue.  He is actively bleeding.  We have not been able to take him down for CT scan because he is not safe to move.  In addition at this time bronchoscopy would not be safe also because of his oxygen requirements and he is also on pressure assist control mode.  Overall his hemoglobin has been relatively stable on serial draws.  Family is deciding about palliative care and comfort care at this point but we have not been able to contact all of the family members.  Medications: Reviewed on Rounds  Physical Exam:  Vitals: Temperature 97.9 pulse 102 respiratory rate 16 blood pressure 126/79 saturations 90%  Ventilator Settings mode of ventilation pressure assist control FiO2 50% tidal volume 493 PEEP 7  . General: Comfortable at this time . Eyes: Grossly normal lids, irises & conjunctiva . ENT: grossly tongue is normal . Neck: no obvious mass . Cardiovascular: S1 S2 normal no gallop . Respiratory: Coarse breath sounds no rhonchi . Abdomen: soft . Skin: no rash seen on limited exam . Musculoskeletal: not rigid . Psychiatric:unable to assess . Neurologic: no seizure no involuntary movements         Lab Data:   Basic Metabolic Panel: Recent Labs  Lab 01/04/18 0606  NA 141  K 3.8  CL 106  CO2 25  GLUCOSE 139*  BUN 16  CREATININE 0.38*  CALCIUM 9.4    Liver Function Tests: No results for input(s): AST, ALT, ALKPHOS, BILITOT, PROT, ALBUMIN in the  last 168 hours. No results for input(s): LIPASE, AMYLASE in the last 168 hours. No results for input(s): AMMONIA in the last 168 hours.  CBC: Recent Labs  Lab 01/05/18 1437 01/05/18 2013 01/06/18 0213 01/06/18 0927 01/06/18 1155  WBC 10.4 10.4 11.1* 9.7 9.7  HGB 10.3* 10.5* 10.0* 9.6* 9.6*  HCT 35.0* 35.6* 34.0* 32.6* 32.7*  MCV 90.9 91.0 91.6 91.8 92.1  PLT 322 335 284 273 275    Cardiac Enzymes: No results for input(s): CKTOTAL, CKMB, CKMBINDEX, TROPONINI in the last 168 hours.  BNP (last 3 results) Recent Labs    11/22/17 0116  BNP 170.4*    ProBNP (last 3 results) No results for input(s): PROBNP in the last 8760 hours.  Radiological Exams: No results found.  Assessment/Plan Active Problems:   OSA (obstructive sleep apnea)   PAF (paroxysmal atrial fibrillation) (HCC)   Basilar artery occlusion   Primary papillary adenocarcinoma of thyroid gland (HCC)   HCAP (healthcare-associated pneumonia)   Severe sepsis (HCC)   CHF (congestive heart failure) (HCC)   Acute on chronic respiratory failure with hypoxia ([place])   1. Acute on chronic respiratory failure with hypoxia patient is doing poorly remains on full vent support on pressure assist control mode.  He is not a candidate for any type of weaning.  Currently has been requiring 50% oxygen.  His PEEP is at 7 at this time.  He continues to have some bleeding however  the hemoglobin as noted above has been relatively stable. 2. Paroxysmal atrial fibrillation currently the rate is controlled he is not having any issues with tachycardia. 3. Healthcare associated pneumonia has been treated at this time 4. Primary papillary adenocarcinoma metastatic I reviewed his previous CT and there was compression of the airway noted at that time I suspect that the situation may have gotten worse however as noted were not able to rescan him. 5. Severe sepsis hemodynamically right now he is stable so far 6. Congestive heart failure  currently is compensated overall prognosis is guarded   I have personally seen and evaluated the patient, evaluated laboratory and imaging results, formulated the assessment and plan and placed orders. The Patient requires high complexity decision making for assessment and support.  Case was discussed on Rounds with the Respiratory Therapy Staff time spent 35 minutes reviewing the chart record and discussion on rounds with the medical team as well as primary care team  Allyne Gee, MD [facility] Pulmonary Critical Care Medicine Sleep Medicine

## 2018-01-07 LAB — PREPARE FRESH FROZEN PLASMA

## 2018-01-07 LAB — BPAM FFP
BLOOD PRODUCT EXPIRATION DATE: 201908192359
Blood Product Expiration Date: 201908192359
ISSUE DATE / TIME: 201908150219
ISSUE DATE / TIME: 201908141318
UNIT TYPE AND RH: 1700
Unit Type and Rh: 7300

## 2018-01-07 LAB — BASIC METABOLIC PANEL
ANION GAP: 7 (ref 5–15)
BUN: 18 mg/dL (ref 8–23)
CHLORIDE: 106 mmol/L (ref 98–111)
CO2: 29 mmol/L (ref 22–32)
Calcium: 9.4 mg/dL (ref 8.9–10.3)
Creatinine, Ser: 0.42 mg/dL — ABNORMAL LOW (ref 0.61–1.24)
GFR calc Af Amer: >60 mL/min (ref >60)
GFR calc non Af Amer: >60 mL/min (ref >60)
Glucose, Bld: 138 mg/dL — ABNORMAL HIGH (ref 70–99)
POTASSIUM: 3.9 mmol/L (ref 3.5–5.1)
Sodium: 142 mmol/L (ref 135–145)

## 2018-01-07 LAB — CBC
HEMATOCRIT: 31.4 % — AB (ref 39.0–52.0)
HEMOGLOBIN: 9.2 g/dL — AB (ref 13.0–17.0)
MCH: 26.9 pg (ref 26.0–34.0)
MCHC: 29.3 g/dL — AB (ref 30.0–36.0)
MCV: 91.8 fL (ref 78.0–100.0)
Platelets: 254 10*3/uL (ref 150–400)
RBC: 3.42 MIL/uL — ABNORMAL LOW (ref 4.22–5.81)
RDW: 17.2 % — ABNORMAL HIGH (ref 11.5–15.5)
WBC: 8.6 10*3/uL (ref 4.0–10.5)

## 2018-01-07 LAB — PROTIME-INR
INR: 1.4
Prothrombin Time: 17 seconds — ABNORMAL HIGH (ref 11.4–15.2)

## 2018-01-07 NOTE — Progress Notes (Signed)
Pulmonary [place]   PULMONARY SERVICE  PROGRESS NOTE  Date of Service: 01/07/2018  Jaquil Todt  VFI:[id number]  DOB: Jan 28, 1956   DOA: 12/07/2017  Referring Physician: Merton Border, MD  HPI: Ronte Parker is a 62 y.o. male seen for follow up of Acute on Chronic Respiratory Failure.  Patient continues to do poorly remains on pressure assist control mode.  Continues to have bleeding the patient right now is requiring 50% oxygen.  We still do not have ordered from the family regarding aggressiveness of care which as noted previously is futile at this point based on the patient's underlying diagnoses  Medications: Reviewed on Rounds  Physical Exam:  Vitals: Temperature 100.3 pulse 127 respiratory rate 30 blood pressure 121/71 saturations 97%  Ventilator Settings mode of ventilation pressure assist control FiO2 50% tidal volume 519 PEEP 7  . General: Comfortable at this time . Eyes: Grossly normal lids, irises & conjunctiva . ENT: grossly tongue is normal . Neck: no obvious mass . Cardiovascular: S1 S2 normal no gallop . Respiratory: Coarse breath sounds noted rhonchi noted . Abdomen: soft . Skin: no rash seen on limited exam . Musculoskeletal: not rigid . Psychiatric:unable to assess . Neurologic: no seizure no involuntary movements         Lab Data:   Basic Metabolic Panel: Recent Labs  Lab 01/04/18 0606 01/07/18 0556  NA 141 142  K 3.8 3.9  CL 106 106  CO2 25 29  GLUCOSE 139* 138*  BUN 16 18  CREATININE 0.38* 0.42*  CALCIUM 9.4 9.4    Liver Function Tests: No results for input(s): AST, ALT, ALKPHOS, BILITOT, PROT, ALBUMIN in the last 168 hours. No results for input(s): LIPASE, AMYLASE in the last 168 hours. No results for input(s): AMMONIA in the last 168 hours.  CBC: Recent Labs  Lab 01/05/18 2013 01/06/18 0213 01/06/18 0927 01/06/18 1155 01/06/18 2334  WBC 10.4 11.1* 9.7 9.7 8.6  HGB 10.5* 10.0* 9.6* 9.6*  9.2*  HCT 35.6* 34.0* 32.6* 32.7* 31.4*  MCV 91.0 91.6 91.8 92.1 91.8  PLT 335 284 273 275 254    Cardiac Enzymes: No results for input(s): CKTOTAL, CKMB, CKMBINDEX, TROPONINI in the last 168 hours.  BNP (last 3 results) Recent Labs    11/22/17 0116  BNP 170.4*    ProBNP (last 3 results) No results for input(s): PROBNP in the last 8760 hours.  Radiological Exams: No results found.  Assessment/Plan Active Problems:   OSA (obstructive sleep apnea)   PAF (paroxysmal atrial fibrillation) (HCC)   Basilar artery occlusion   Primary papillary adenocarcinoma of thyroid gland (HCC)   HCAP (healthcare-associated pneumonia)   Severe sepsis (HCC)   CHF (congestive heart failure) (HCC)   Acute on chronic respiratory failure with hypoxia ([place])   1. Acute on chronic respiratory failure with hypoxia patient continues on full vent support not amenable.  We will continue with supportive care. 2. Paroxysmal atrial fibrillation currently having tachycardia medications are being adjusted once again 3. Basilar artery occlusion with stroke stable at this time unchanged 4. Primary papillary adenocarcinoma of the thyroid patient remains with a poor prognosis with metastatic disease 5. Healthcare associated pneumonia treated we will continue to follow prognosis guarded 6. Severe sepsis right now is hemodynamically stable 7. CHF follow fluid status   I have personally seen and evaluated the patient, evaluated laboratory and imaging results, formulated the assessment and plan and placed orders. The Patient requires high complexity decision making for  assessment and support.  Case was discussed on Rounds with the Respiratory Therapy Staff  Allyne Gee, MD [facility] Pulmonary Critical Care Medicine Sleep Medicine

## 2018-01-08 LAB — PROTIME-INR
INR: 1.17
Prothrombin Time: 14.8 seconds (ref 11.4–15.2)

## 2018-01-08 LAB — BLOOD GAS, ARTERIAL
Acid-Base Excess: 4 mmol/L — ABNORMAL HIGH (ref 0.0–2.0)
BICARBONATE: 27.6 mmol/L (ref 20.0–28.0)
FIO2: 85
O2 Saturation: 97.5 %
PATIENT TEMPERATURE: 98.6
PCO2 ART: 38.2 mmHg (ref 32.0–48.0)
PEEP/CPAP: 7 cmH2O
PO2 ART: 95.5 mmHg (ref 83.0–108.0)
Pressure control: 20 cmH2O
RATE: 24 resp/min
pH, Arterial: 7.472 — ABNORMAL HIGH (ref 7.350–7.450)

## 2018-01-09 LAB — TYPE AND SCREEN
ABO/RH(D): B POS
ANTIBODY SCREEN: NEGATIVE
UNIT DIVISION: 0
Unit division: 0

## 2018-01-09 LAB — BPAM RBC
BLOOD PRODUCT EXPIRATION DATE: 201909072359
Blood Product Expiration Date: 201909082359
Unit Type and Rh: 7300
Unit Type and Rh: 7300

## 2018-01-09 LAB — PROTIME-INR
INR: 1.23
PROTHROMBIN TIME: 15.4 s — AB (ref 11.4–15.2)

## 2018-01-09 NOTE — Progress Notes (Signed)
Pulmonary [place]   PULMONARY SERVICE  PROGRESS NOTE  Date of Service: 01/09/2018  [name] [name]  [id number]  DOB: Sep 18, 1955   DOA: 12/11/2017  Referring Physician: Merton Border, MD  HPI: [name] [name] is a 62 y.o. male seen for follow up of Acute on Chronic Respiratory Failure.  Continues to not do well remains on the ventilator and full support.  Overnight apparently patient went into rapid atrial fibrillation once again.  Oxygen was increased to 75% and also patient was started on Cardizem drip.  Right now saturations are a little bit better remains on a PEEP of 7.  He is awake nonverbal though.  Is had a little bit decrease in the hemoptysis but still see that there was some blood in the canister  Medications: Reviewed on Rounds  Physical Exam:  Vitals: Temperature 102.5 pulse 118 respiratory rate 30 blood pressure 142/81 saturations 99%  Ventilator Settings mode of ventilation pressure assist control FiO2 75% tidal volume 468 PEEP 7  . General: Comfortable at this time . Eyes: Grossly normal lids, irises & conjunctiva . ENT: grossly tongue is normal . Neck: no obvious mass . Cardiovascular: S1 S2 normal no gallop . Respiratory: No rhonchi no rales are noted at this time . Abdomen: soft . Skin: no rash seen on limited exam . Musculoskeletal: not rigid . Psychiatric:unable to assess . Neurologic: no seizure no involuntary movements         Lab Data:   Basic Metabolic Panel: Recent Labs  Lab 01/04/18 0606 01/07/18 0556  NA 141 142  K 3.8 3.9  CL 106 106  CO2 25 29  GLUCOSE 139* 138*  BUN 16 18  CREATININE 0.38* 0.42*  CALCIUM 9.4 9.4    Liver Function Tests: No results for input(s): AST, ALT, ALKPHOS, BILITOT, PROT, ALBUMIN in the last 168 hours. No results for input(s): LIPASE, AMYLASE in the last 168 hours. No results for input(s): AMMONIA in the last 168 hours.  CBC: Recent Labs  Lab 01/05/18 2013  01/06/18 0213 01/06/18 0927 01/06/18 1155 01/06/18 2334  WBC 10.4 11.1* 9.7 9.7 8.6  HGB 10.5* 10.0* 9.6* 9.6* 9.2*  HCT 35.6* 34.0* 32.6* 32.7* 31.4*  MCV 91.0 91.6 91.8 92.1 91.8  PLT 335 284 273 275 254    Cardiac Enzymes: No results for input(s): CKTOTAL, CKMB, CKMBINDEX, TROPONINI in the last 168 hours.  BNP (last 3 results) Recent Labs    11/22/17 0116  BNP 170.4*    ProBNP (last 3 results) No results for input(s): PROBNP in the last 8760 hours.  Radiological Exams: No results found.  Assessment/Plan Active Problems:   OSA (obstructive sleep apnea)   PAF (paroxysmal atrial fibrillation) (HCC)   Basilar artery occlusion   Primary papillary adenocarcinoma of thyroid gland (HCC)   HCAP (healthcare-associated pneumonia)   Severe sepsis (HCC)   CHF (congestive heart failure) (HCC)   Acute on chronic respiratory failure with hypoxia ([place])   1. Acute on chronic respiratory failure with hypoxia we will continue with full vent support.  Try to decrease the FiO2 down since saturations are little bit better. 2. Paroxysmal atrial fibrillation is on Cardizem drip will titrate try to improve the heart rate as tolerated. 3. Primary papillary adenocarcinoma poor prognosis of metastatic disease 4. Healthcare associated pneumonia treated we will continue with supportive care patient's prognosis guarded 5. Severe sepsis right now is hemodynamically stable continue to monitor pressures 6. Congestive heart failure at baseline we will continue  with present management   I have personally seen and evaluated the patient, evaluated laboratory and imaging results, formulated the assessment and plan and placed orders. The Patient requires high complexity decision making for assessment and support.  Case was discussed on Rounds with the Respiratory Therapy Staff  Allyne Gee, MD [facility] Pulmonary Critical Care Medicine Sleep Medicine

## 2018-01-10 ENCOUNTER — Other Ambulatory Visit (HOSPITAL_COMMUNITY): Payer: Self-pay

## 2018-01-10 LAB — COMPREHENSIVE METABOLIC PANEL
ALT: 40 U/L (ref 0–44)
ANION GAP: 13 (ref 5–15)
AST: 30 U/L (ref 15–41)
Albumin: 2 g/dL — ABNORMAL LOW (ref 3.5–5.0)
Alkaline Phosphatase: 64 U/L (ref 38–126)
BUN: 61 mg/dL — AB (ref 8–23)
CHLORIDE: 103 mmol/L (ref 98–111)
CO2: 25 mmol/L (ref 22–32)
Calcium: 9.2 mg/dL (ref 8.9–10.3)
Creatinine, Ser: 1.23 mg/dL (ref 0.61–1.24)
GFR calc non Af Amer: >60 mL/min (ref >60)
Glucose, Bld: 286 mg/dL — ABNORMAL HIGH (ref 70–99)
POTASSIUM: 4.5 mmol/L (ref 3.5–5.1)
Sodium: 141 mmol/L (ref 135–145)
Total Bilirubin: 0.4 mg/dL (ref 0.3–1.2)
Total Protein: 6.3 g/dL — ABNORMAL LOW (ref 6.5–8.1)

## 2018-01-10 LAB — CBC
HEMATOCRIT: 35.1 % — AB (ref 39.0–52.0)
HEMOGLOBIN: 10.7 g/dL — AB (ref 13.0–17.0)
MCH: 27.2 pg (ref 26.0–34.0)
MCHC: 30.5 g/dL (ref 30.0–36.0)
MCV: 89.3 fL (ref 78.0–100.0)
Platelets: 200 10*3/uL (ref 150–400)
RBC: 3.93 MIL/uL — AB (ref 4.22–5.81)
RDW: 17.8 % — ABNORMAL HIGH (ref 11.5–15.5)
WBC: 15.1 10*3/uL — AB (ref 4.0–10.5)

## 2018-01-10 LAB — PROTIME-INR
INR: 1.22
PROTHROMBIN TIME: 15.3 s — AB (ref 11.4–15.2)

## 2018-01-10 NOTE — Progress Notes (Signed)
Pulmonary [place]   PULMONARY SERVICE  PROGRESS NOTE  Date of Service: 01/10/2018  [name] [name]  [id number]  DOB: 1955/10/05   DOA: 12/01/2017  Referring Physician: Merton Border, MD  HPI: [name] [name] is a 62 y.o. male seen for follow up of Acute on Chronic Respiratory Failure.  Patient is still not doing very well has been on pressure assist control mode right now is on 65% FiO2.  Respiratory therapy reported on rounds the bleeding has improved a little bit.  The heart rate is better controlled patient's been requiring Cardizem to control the heart rate.  The FiO2 as mentioned is down from 75%.  Medications: Reviewed on Rounds  Physical Exam:  Vitals: Temperature 98.2 pulse 93 respiratory rate 33 blood pressure 127/71 saturations 100%  Ventilator Settings mode of ventilation pressure assist control FiO2 65% tidal volume 428 PEEP 7  . General: Comfortable at this time . Eyes: Grossly normal lids, irises & conjunctiva . ENT: grossly tongue is normal . Neck: no obvious mass . Cardiovascular: S1 S2 normal no gallop . Respiratory: No rhonchi no rales are noted . Abdomen: soft . Skin: no rash seen on limited exam . Musculoskeletal: not rigid . Psychiatric:unable to assess . Neurologic: no seizure no involuntary movements         Lab Data:   Basic Metabolic Panel: Recent Labs  Lab 01/04/18 0606 01/07/18 0556  NA 141 142  K 3.8 3.9  CL 106 106  CO2 25 29  GLUCOSE 139* 138*  BUN 16 18  CREATININE 0.38* 0.42*  CALCIUM 9.4 9.4    Liver Function Tests: No results for input(s): AST, ALT, ALKPHOS, BILITOT, PROT, ALBUMIN in the last 168 hours. No results for input(s): LIPASE, AMYLASE in the last 168 hours. No results for input(s): AMMONIA in the last 168 hours.  CBC: Recent Labs  Lab 01/05/18 2013 01/06/18 0213 01/06/18 0927 01/06/18 1155 01/06/18 2334  WBC 10.4 11.1* 9.7 9.7 8.6  HGB 10.5* 10.0* 9.6* 9.6* 9.2*   HCT 35.6* 34.0* 32.6* 32.7* 31.4*  MCV 91.0 91.6 91.8 92.1 91.8  PLT 335 284 273 275 254    Cardiac Enzymes: No results for input(s): CKTOTAL, CKMB, CKMBINDEX, TROPONINI in the last 168 hours.  BNP (last 3 results) Recent Labs    11/22/17 0116  BNP 170.4*    ProBNP (last 3 results) No results for input(s): PROBNP in the last 8760 hours.  Radiological Exams: No results found.  Assessment/Plan Active Problems:   OSA (obstructive sleep apnea)   PAF (paroxysmal atrial fibrillation) (HCC)   Basilar artery occlusion   Primary papillary adenocarcinoma of thyroid gland (HCC)   HCAP (healthcare-associated pneumonia)   Severe sepsis (HCC)   CHF (congestive heart failure) (HCC)   Acute on chronic respiratory failure with hypoxia ([place])   1. Acute on chronic respiratory failure with hypoxia patient right now is not weaning.  Would try to decrease the FiO2 which has been somewhat successful.  Continue to try to advance.  Main issue right now is the heart rate and him requiring Cardizem which is being adjusted. 2. Paroxysmal atrial fibrillation heart rate is better controlled we will continue with the Cardizem as ordered. 3. Primary papillary adenocarcinoma poor prognosis metastatic disease 4. Severe sepsis hemodynamically stable 5. Healthcare associated pneumonia treated we will continue to follow 6. Congestive heart failure at baseline patient does decompensate when the heart rate goes out of control    I have personally seen and  evaluated the patient, evaluated laboratory and imaging results, formulated the assessment and plan and placed orders. The Patient requires high complexity decision making for assessment and support.  Case was discussed on Rounds with the Respiratory Therapy Staff  Allyne Gee, MD [facility] Pulmonary Critical Care Medicine Sleep Medicine

## 2018-01-11 ENCOUNTER — Encounter: Payer: Self-pay | Admitting: *Deleted

## 2018-01-11 ENCOUNTER — Telehealth: Payer: Self-pay | Admitting: *Deleted

## 2018-01-11 LAB — PROTIME-INR
INR: 1.19
Prothrombin Time: 15 seconds (ref 11.4–15.2)

## 2018-01-11 LAB — VANCOMYCIN, TROUGH: Vancomycin Tr: 17 ug/mL (ref 15–20)

## 2018-01-11 NOTE — Progress Notes (Signed)
Pulmonary [place]   PULMONARY SERVICE  PROGRESS NOTE  Date of Service: 01/11/2018  [name] [name]  [id number]  DOB: 01-09-1956   DOA: 12/14/2017  Referring Physician: Merton Border, MD  HPI: [name] [name] is a 62 y.o. male seen for follow up of Acute on Chronic Respiratory Failure.  Patient's sons were present in the room and had a conversation with them.  They understand that the patient is critically ill.  They also understand that it is not likely to survive with his underlying diagnosis.  They are going to make him comfort care.  At the time they stated they were waiting for their sister to come in  Medications: Reviewed on Rounds  Physical Exam:  Vitals: Temperature 100.4 pulse 115 respiratory rate 17 blood pressure 144/73 saturations 96%  Ventilator Settings mode of ventilation assist control FiO2 45% tidal volume 400 PEEP 7  . General: Comfortable at this time . Eyes: Grossly normal lids, irises & conjunctiva . ENT: grossly tongue is normal . Neck: no obvious mass . Cardiovascular: S1 S2 normal no gallop . Respiratory: Coarse breath sounds no rhonchi are noted . Abdomen: soft . Skin: no rash seen on limited exam . Musculoskeletal: not rigid . Psychiatric:unable to assess . Neurologic: no seizure no involuntary movements         Lab Data:   Basic Metabolic Panel: Recent Labs  Lab 01/07/18 0556 01/10/18 0733  NA 142 141  K 3.9 4.5  CL 106 103  CO2 29 25  GLUCOSE 138* 286*  BUN 18 61*  CREATININE 0.42* 1.23  CALCIUM 9.4 9.2    Liver Function Tests: Recent Labs  Lab 01/10/18 0733  AST 30  ALT 40  ALKPHOS 64  BILITOT 0.4  PROT 6.3*  ALBUMIN 2.0*   No results for input(s): LIPASE, AMYLASE in the last 168 hours. No results for input(s): AMMONIA in the last 168 hours.  CBC: Recent Labs  Lab 01/06/18 0213 01/06/18 0927 01/06/18 1155 01/06/18 2334 01/10/18 1035  WBC 11.1* 9.7 9.7 8.6 15.1*  HGB  10.0* 9.6* 9.6* 9.2* 10.7*  HCT 34.0* 32.6* 32.7* 31.4* 35.1*  MCV 91.6 91.8 92.1 91.8 89.3  PLT 284 273 275 254 200    Cardiac Enzymes: No results for input(s): CKTOTAL, CKMB, CKMBINDEX, TROPONINI in the last 168 hours.  BNP (last 3 results) Recent Labs    11/22/17 0116  BNP 170.4*    ProBNP (last 3 results) No results for input(s): PROBNP in the last 8760 hours.  Radiological Exams: Dg Chest Port 1 View  Result Date: 01/10/2018 CLINICAL DATA:  Fever, long-term tracheostomy tube, obesity EXAM: PORTABLE CHEST 1 VIEW COMPARISON:  12/26/2017 FINDINGS: Tracheostomy tube in satisfactory position. Bilateral diffuse mild interstitial thickening. More focal airspace disease in the right upper lobe and right lower lobe. No pleural effusion or pneumothorax. Stable cardiomegaly. No acute osseous abnormality. IMPRESSION: 1. Findings concerning for CHF. More focal airspace opacity in the right upper lobe and infrahilar right lower lobe which may reflect atelectasis versus pneumonia. Electronically Signed   By: Kathreen Devoid   On: 01/10/2018 10:48    Assessment/Plan Active Problems:   OSA (obstructive sleep apnea)   PAF (paroxysmal atrial fibrillation) (HCC)   Basilar artery occlusion   Primary papillary adenocarcinoma of thyroid gland ([place])   HCAP (healthcare-associated pneumonia)   Severe sepsis (HCC)   CHF (congestive heart failure) (HCC)   Acute on chronic respiratory failure with hypoxia ([place])   1. Acute  on chronic respiratory failure with hypoxia we will continue with full support on assist control right now.  As noted patient is going to be made comfort care by the family we are waiting for the complete family to come in. 2. Paroxysmal atrial fibrillation rate is controlled right now we will continue present therapy 3. Healthcare associated pneumonia treated with antibiotics we will continue to monitor the hemodynamics 4. Congestive heart failure chest x-ray still showing significant  findings for failure continue with supportive care 5. Basilar artery occlusion with stroke unchanged   I have personally seen and evaluated the patient, evaluated laboratory and imaging results, formulated the assessment and plan and placed orders. The Patient requires high complexity decision making for assessment and support.  Case was discussed on Rounds with the Respiratory Therapy Staff  Allyne Gee, MD [facility] Pulmonary Critical Care Medicine Sleep Medicine

## 2018-01-11 NOTE — Telephone Encounter (Signed)
[id number]/RCB'U call from [place] [employer] [place] [employer] that needed a preist to give his last rite per family request.  Call Father Bill at [facility] [employer] since patient is from out of town and he agreed to visit patient today.  [facility] notified of pening visit for patient.

## 2018-01-12 LAB — PROTIME-INR
INR: 1.25
PROTHROMBIN TIME: 15.6 s — AB (ref 11.4–15.2)

## 2018-01-12 NOTE — Progress Notes (Signed)
Pulmonary [place]   PULMONARY SERVICE  PROGRESS NOTE  Date of Service: 01/12/2018  [name] [name]  [id number]  DOB: [birth year]   DOA: 12/09/2017  Referring Physician: Merton Border, MD  HPI: [name] [name] is a 62 y.o. male seen for follow up of Acute on Chronic Respiratory Failure.  Patient remains comfortable without distress this morning was on pressure support.  Both sons were present in the room.  They are looking at withdrawal of life support towards the end of this week  Medications: Reviewed on Rounds  Physical Exam:  Vitals: Temperature 98.6 pulse 104 respiratory rate 24 blood pressure 138/77 saturations 95%  Ventilator Settings mode of ventilation pressure support FiO2 35% tidal volume 443 pressure support 12 PEEP 7  . General: Comfortable at this time . Eyes: Grossly normal lids, irises & conjunctiva . ENT: grossly tongue is normal . Neck: no obvious mass . Cardiovascular: S1 S2 normal no gallop . Respiratory: No rhonchi no rales coarse breath sounds . Abdomen: soft . Skin: no rash seen on limited exam . Musculoskeletal: not rigid . Psychiatric:unable to assess . Neurologic: no seizure no involuntary movements         Lab Data:   Basic Metabolic Panel: Recent Labs  Lab 01/07/18 0556 01/10/18 0733  NA 142 141  K 3.9 4.5  CL 106 103  CO2 29 25  GLUCOSE 138* 286*  BUN 18 61*  CREATININE 0.42* 1.23  CALCIUM 9.4 9.2    Liver Function Tests: Recent Labs  Lab 01/10/18 0733  AST 30  ALT 40  ALKPHOS 64  BILITOT 0.4  PROT 6.3*  ALBUMIN 2.0*   No results for input(s): LIPASE, AMYLASE in the last 168 hours. No results for input(s): AMMONIA in the last 168 hours.  CBC: Recent Labs  Lab 01/06/18 0213 01/06/18 0927 01/06/18 1155 01/06/18 2334 01/10/18 1035  WBC 11.1* 9.7 9.7 8.6 15.1*  HGB 10.0* 9.6* 9.6* 9.2* 10.7*  HCT 34.0* 32.6* 32.7* 31.4* 35.1*  MCV 91.6 91.8 92.1 91.8 89.3  PLT 284 273 275  254 200    Cardiac Enzymes: No results for input(s): CKTOTAL, CKMB, CKMBINDEX, TROPONINI in the last 168 hours.  BNP (last 3 results) Recent Labs    11/22/17 0116  BNP 170.4*    ProBNP (last 3 results) No results for input(s): PROBNP in the last 8760 hours.  Radiological Exams: No results found.  Assessment/Plan Active Problems:   OSA (obstructive sleep apnea)   PAF (paroxysmal atrial fibrillation) (HCC)   Basilar artery occlusion   Primary papillary adenocarcinoma of thyroid gland (HCC)   HCAP (healthcare-associated pneumonia)   Severe sepsis (HCC)   CHF (congestive heart failure) (HCC)   Acute on chronic respiratory failure with hypoxia ([place])   1. Acute on chronic respiratory failure with hypoxia we will continue with supportive care planning on withdrawal of life support at the end of the week 2. Paroxysmal atrial fibrillation rate is little bit better control. 3. CHF at baseline 4. Healthcare associated pneumonia treated 5. Severe sepsis monitor pressures hemodynamically stable 6. Basilar artery occlusion status post stroke stable prognosis poor   I have personally seen and evaluated the patient, evaluated laboratory and imaging results, formulated the assessment and plan and placed orders. The Patient requires high complexity decision making for assessment and support.  Case was discussed on Rounds with the Respiratory Therapy Staff  Allyne Gee, MD [facility] Pulmonary Critical Care Medicine Sleep Medicine

## 2018-01-13 LAB — PROTIME-INR
INR: 1.26
Prothrombin Time: 15.7 seconds — ABNORMAL HIGH (ref 11.4–15.2)

## 2018-01-13 NOTE — Progress Notes (Signed)
Pulmonary [place]   PULMONARY SERVICE  PROGRESS NOTE  Date of Service: 01/13/2018  [name] [name]  [id number]  DOB: [birth year]   DOA: 12/03/2017  Referring Physician: Merton Border, MD  HPI: [name] [name] is a 62 y.o. male seen for follow up of Acute on Chronic Respiratory Failure.  Patient is resting comfortably family was in the room they are going to withdrawal his ventilator on Friday  Medications: Reviewed on Rounds  Physical Exam:  Vitals: Temperature 98.4 pulse 96 respiratory rate 12 blood pressure 134/70 saturation 90    . General: Comfortable at this time . Eyes: Grossly normal lids, irises & conjunctiva . ENT: grossly tongue is normal . Neck: no obvious mass . Cardiovascular: S1 S2 normal no gallop . Respiratory: Coarse breath sounds noted . Abdomen: soft . Skin: no rash seen on limited exam . Musculoskeletal: not rigid . Psychiatric:unable to assess . Neurologic: no seizure no involuntary movements         Lab Data:   Basic Metabolic Panel: Recent Labs  Lab 01/07/18 0556 01/10/18 0733  NA 142 141  K 3.9 4.5  CL 106 103  CO2 29 25  GLUCOSE 138* 286*  BUN 18 61*  CREATININE 0.42* 1.23  CALCIUM 9.4 9.2    Liver Function Tests: Recent Labs  Lab 01/10/18 0733  AST 30  ALT 40  ALKPHOS 64  BILITOT 0.4  PROT 6.3*  ALBUMIN 2.0*   No results for input(s): LIPASE, AMYLASE in the last 168 hours. No results for input(s): AMMONIA in the last 168 hours.  CBC: Recent Labs  Lab 01/06/18 2334 01/10/18 1035  WBC 8.6 15.1*  HGB 9.2* 10.7*  HCT 31.4* 35.1*  MCV 91.8 89.3  PLT 254 200    Cardiac Enzymes: No results for input(s): CKTOTAL, CKMB, CKMBINDEX, TROPONINI in the last 168 hours.  BNP (last 3 results) Recent Labs    11/22/17 0116  BNP 170.4*    ProBNP (last 3 results) No results for input(s): PROBNP in the last 8760 hours.  Radiological Exams: No results  found.  Assessment/Plan Active Problems:   OSA (obstructive sleep apnea)   PAF (paroxysmal atrial fibrillation) (HCC)   Basilar artery occlusion   Primary papillary adenocarcinoma of thyroid gland (HCC)   HCAP (healthcare-associated pneumonia)   Severe sepsis (HCC)   CHF (congestive heart failure) (HCC)   Acute on chronic respiratory failure with hypoxia ([place])   1. Acute on chronic respiratory failure with hypoxia patient has been converted to comfort care.  Will be withdrawing ventilator tomorrow 2. Primary papillary adenocarcinoma supportive care prognosis poor 3. CHF baseline 4. Status post stroke supportive care comfort care   I have personally seen and evaluated the patient, evaluated laboratory and imaging results, formulated the assessment and plan and placed orders. The Patient requires high complexity decision making for assessment and support.  Case was discussed on Rounds with the Respiratory Therapy Staff  Allyne Gee, MD [facility] Pulmonary Critical Care Medicine Sleep Medicine

## 2018-01-14 LAB — CBC
HEMATOCRIT: 34.3 % — AB (ref 39.0–52.0)
Hemoglobin: 9.8 g/dL — ABNORMAL LOW (ref 13.0–17.0)
MCH: 26.6 pg (ref 26.0–34.0)
MCHC: 28.6 g/dL — ABNORMAL LOW (ref 30.0–36.0)
MCV: 93.2 fL (ref 78.0–100.0)
Platelets: 180 10*3/uL (ref 150–400)
RBC: 3.68 MIL/uL — AB (ref 4.22–5.81)
RDW: 18 % — AB (ref 11.5–15.5)
WBC: 7.3 10*3/uL (ref 4.0–10.5)

## 2018-01-14 LAB — PROTIME-INR
INR: 1.21
PROTHROMBIN TIME: 15.2 s (ref 11.4–15.2)

## 2018-01-14 LAB — RENAL FUNCTION PANEL
ALBUMIN: 1.8 g/dL — AB (ref 3.5–5.0)
ANION GAP: 6 (ref 5–15)
BUN: 38 mg/dL — AB (ref 8–23)
CHLORIDE: 116 mmol/L — AB (ref 98–111)
CO2: 25 mmol/L (ref 22–32)
Calcium: 8.9 mg/dL (ref 8.9–10.3)
Creatinine, Ser: 0.6 mg/dL — ABNORMAL LOW (ref 0.61–1.24)
GFR calc Af Amer: >60 mL/min (ref >60)
Glucose, Bld: 357 mg/dL — ABNORMAL HIGH (ref 70–99)
PHOSPHORUS: 2.8 mg/dL (ref 2.5–4.6)
POTASSIUM: 4.8 mmol/L (ref 3.5–5.1)
Sodium: 147 mmol/L — ABNORMAL HIGH (ref 135–145)

## 2018-01-14 LAB — MAGNESIUM: Magnesium: 1.9 mg/dL (ref 1.7–2.4)

## 2018-01-23 DEATH — deceased

## 2018-09-26 NOTE — Congregational Nurse Program (Signed)
Note entered to close case/rld

## 2019-09-09 IMAGING — CT CT HEAD W/O CM
3 series · 15 of 47 positions shown, 18 images · non-contrast
Comparison: 11/24/2017

CLINICAL DATA: Altered level of consciousness

EXAM:
CT HEAD WITHOUT CONTRAST
TECHNIQUE: Contiguous axial images were obtained from the base of the skull
through the vertex without intravenous contrast.

[Series 3: head 5.0 h30s · axial · 0.48mm/px · z∈[-71,+64]mm · 9 of 33 slices shown, 12 images]
[im 3/33  brain]
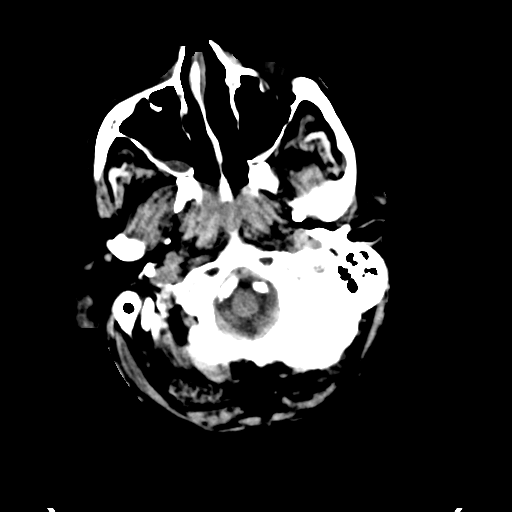
[im 3/33  bone]
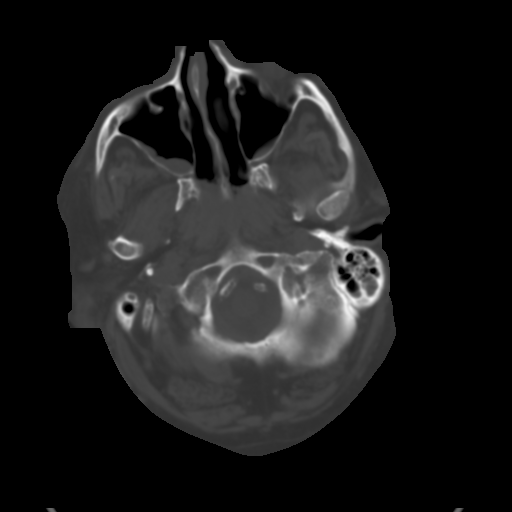
[im 6/33  brain]
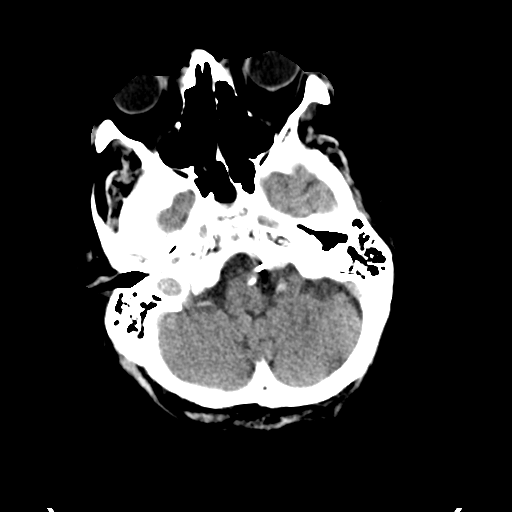
[im 9/33  brain]
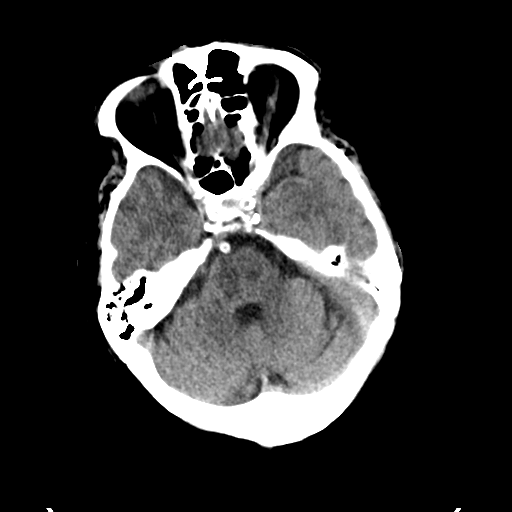
[im 13/33  brain]
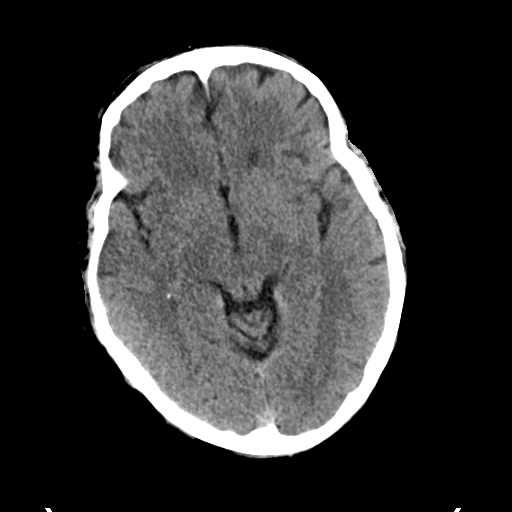
[im 17/33  brain]
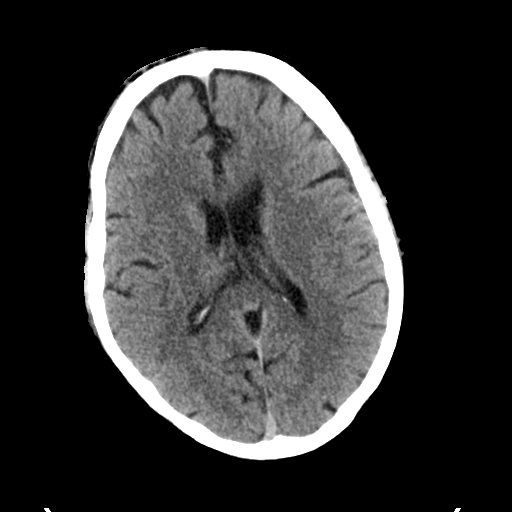
[im 17/33  bone]
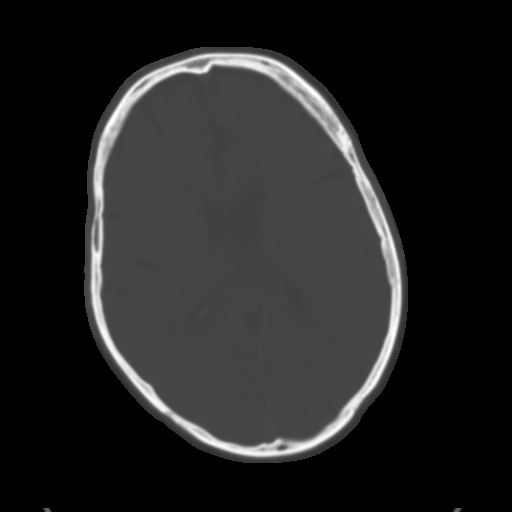
[im 20/33  brain]
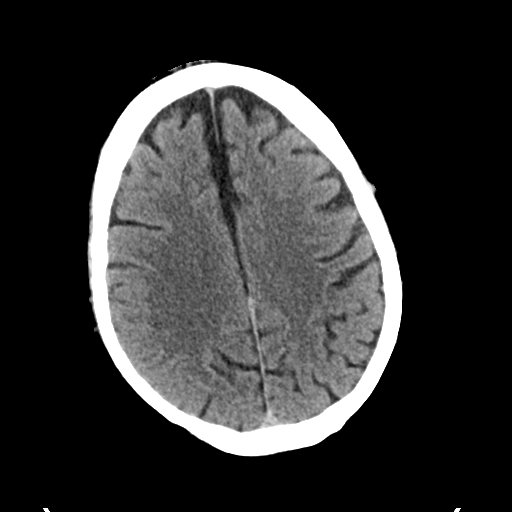
[im 24/33  brain]
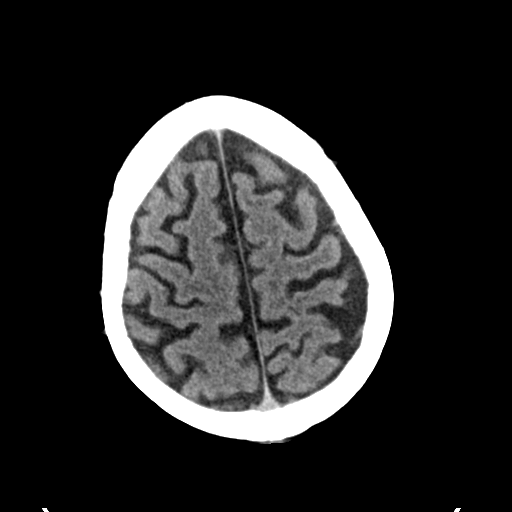
[im 27/33  brain]
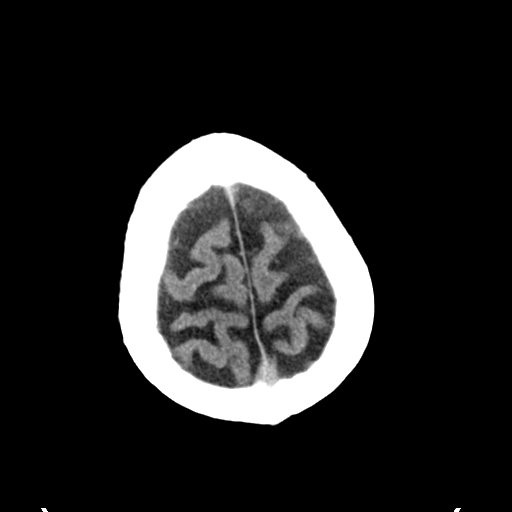
[im 30/33  brain]
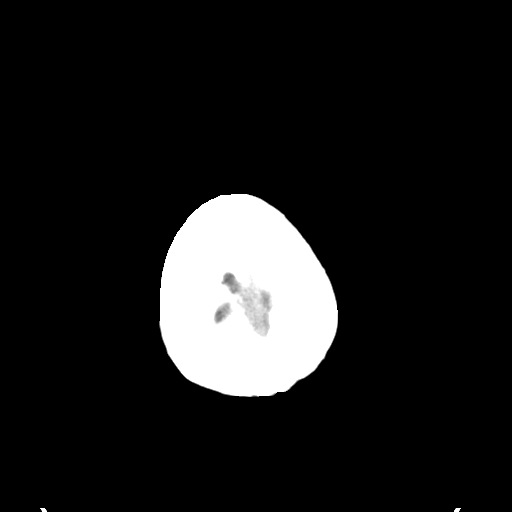
[im 30/33  bone]
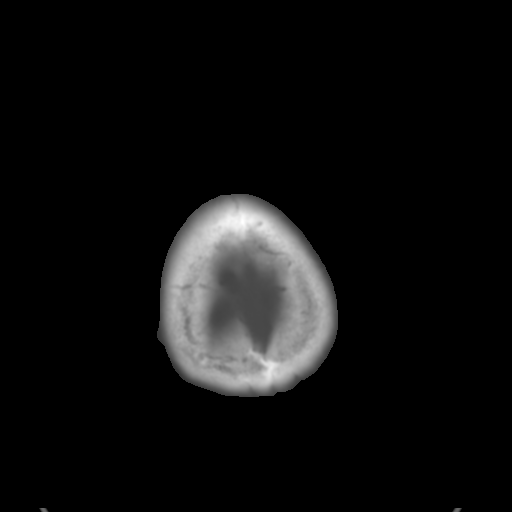

[Series 5: head 3.0 mpr cor · coronal · 0.34mm/px · 3 of 68 slices shown]
[im 23/68  brain]
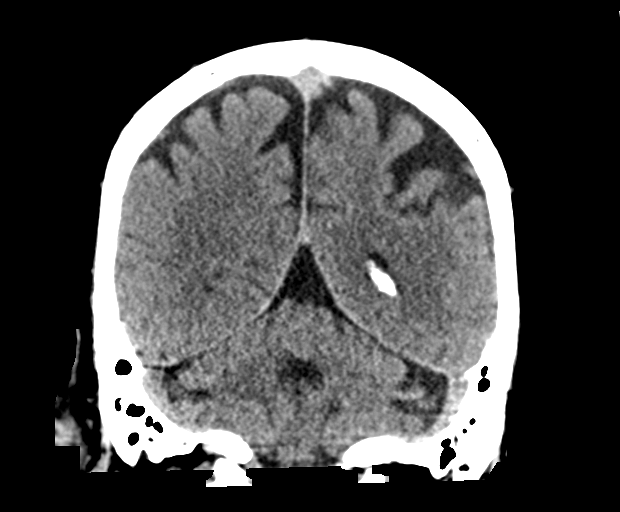
[im 30/68  brain]
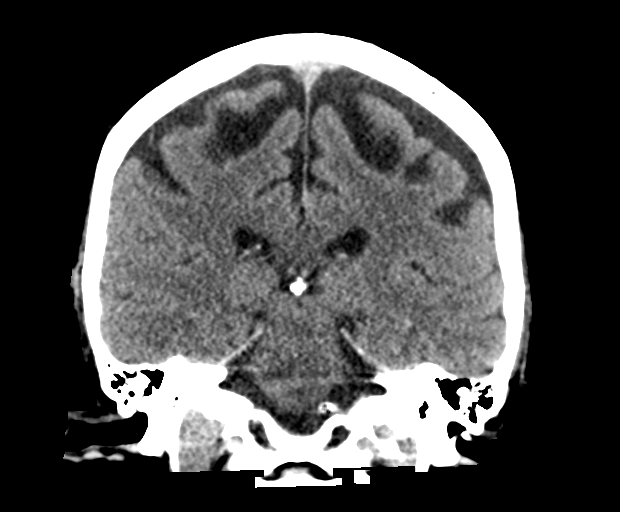
[im 38/68  brain]
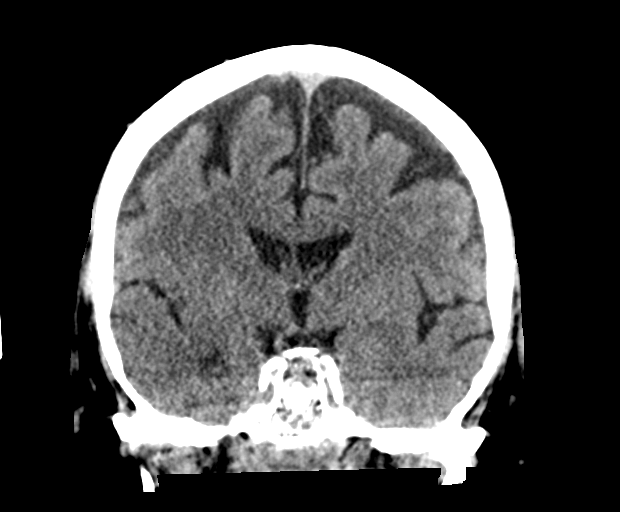

[Series 6: head 3.0 mpr sag · sagittal · 0.34mm/px · 3 of 54 slices shown]
[im 18/54  brain]
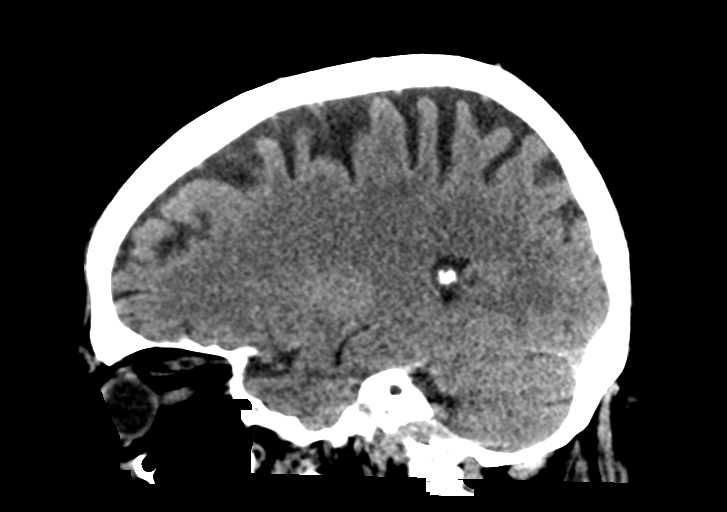
[im 27/54  brain]
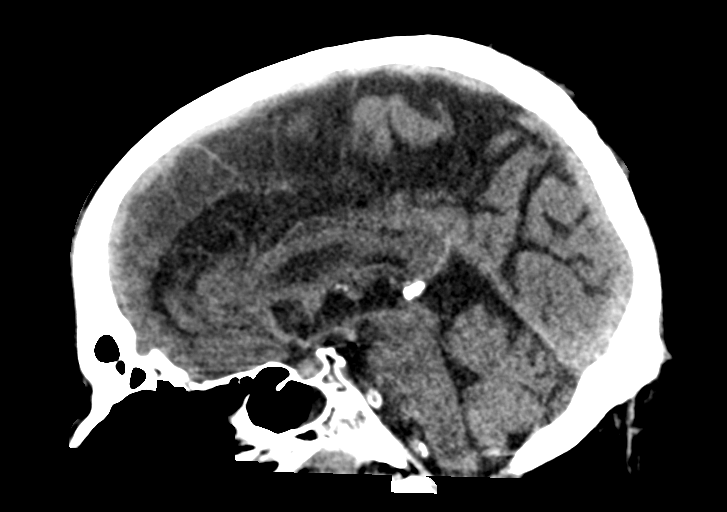
[im 36/54  brain]
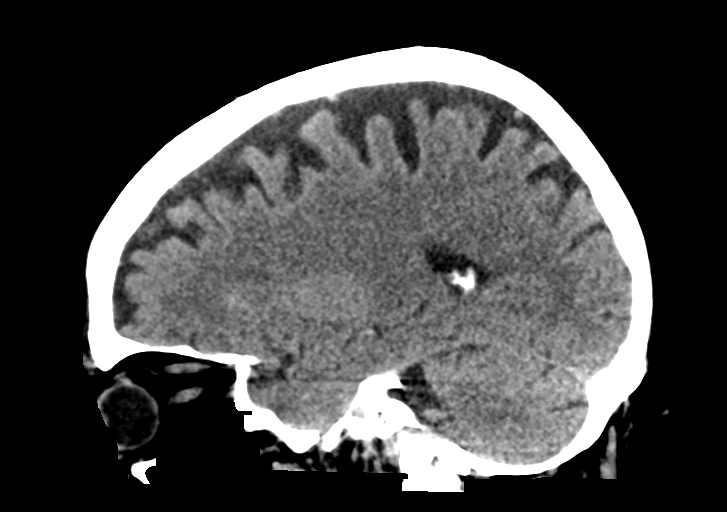

[15 of 47 positions shown; findings below may reference images not displayed]

FINDINGS: Brain: No evidence of acute infarction, hemorrhage, extra-axial
collection, ventriculomegaly, or mass effect. Low attenuation of the
brainstem consistent with normal evolution of a brainstem infarct.
Generalized cerebral atrophy. Periventricular white matter low
attenuation likely secondary to microangiopathy.

Vascular: Cerebrovascular atherosclerotic calcifications are noted.

Skull: Negative for fracture or focal lesion.

Sinuses/Orbits: Visualized portions of the orbits are unremarkable.
Bilateral maxillary sinus mucosal thickening. Right sphenoid sinus
air-fluid levels.

Other: None.
IMPRESSION: 1. Continued normal evolution of brainstem infarct. Otherwise, no
acute intracranial pathology.
